# Patient Record
Sex: Female | Born: 1990 | Race: Black or African American | Hispanic: No | Marital: Single | Smoking: Never smoker
Health system: Southern US, Community
[De-identification: ages and names within clinical notes are randomized; demographics above are authoritative.]

## PROBLEM LIST (undated history)

## (undated) ENCOUNTER — Inpatient Hospital Stay (HOSPITAL_COMMUNITY): Payer: Self-pay

## (undated) ENCOUNTER — Ambulatory Visit (HOSPITAL_COMMUNITY): Admission: EM | Disposition: A | Discharge status: Nursing Home (Includes ICF) | Payer: Medicaid Other

## (undated) DIAGNOSIS — T8149XA Infection following a procedure, other surgical site, initial encounter: Secondary | ICD-10-CM

## (undated) DIAGNOSIS — K8020 Calculus of gallbladder without cholecystitis without obstruction: Secondary | ICD-10-CM

## (undated) DIAGNOSIS — J45909 Unspecified asthma, uncomplicated: Secondary | ICD-10-CM

## (undated) DIAGNOSIS — O24419 Gestational diabetes mellitus in pregnancy, unspecified control: Secondary | ICD-10-CM

## (undated) DIAGNOSIS — L509 Urticaria, unspecified: Secondary | ICD-10-CM

## (undated) HISTORY — DX: Unspecified asthma, uncomplicated: J45.909

## (undated) HISTORY — PX: DILATION AND EVACUATION: SHX1459

## (undated) HISTORY — DX: Urticaria, unspecified: L50.9

## (undated) HISTORY — PX: WISDOM TOOTH EXTRACTION: SHX21

---

## 2012-02-05 DIAGNOSIS — O358XX0 Maternal care for other (suspected) fetal abnormality and damage, not applicable or unspecified: Secondary | ICD-10-CM | POA: Insufficient documentation

## 2014-07-28 DIAGNOSIS — T8149XA Infection following a procedure, other surgical site, initial encounter: Secondary | ICD-10-CM

## 2014-07-28 HISTORY — DX: Infection following a procedure, other surgical site, initial encounter: T81.49XA

## 2015-02-28 DIAGNOSIS — T8149XA Infection following a procedure, other surgical site, initial encounter: Secondary | ICD-10-CM | POA: Insufficient documentation

## 2015-11-19 ENCOUNTER — Encounter (HOSPITAL_COMMUNITY): Payer: Self-pay | Admitting: Emergency Medicine

## 2015-11-19 ENCOUNTER — Emergency Department (HOSPITAL_COMMUNITY): Payer: Medicaid Other

## 2015-11-19 ENCOUNTER — Emergency Department (HOSPITAL_COMMUNITY)
Admission: EM | Admit: 2015-11-19 | Discharge: 2015-11-19 | Disposition: A | Discharge status: Home / Self Care | Payer: Medicaid Other | Attending: Emergency Medicine | Admitting: Emergency Medicine

## 2015-11-19 DIAGNOSIS — R1110 Vomiting, unspecified: Secondary | ICD-10-CM | POA: Insufficient documentation

## 2015-11-19 DIAGNOSIS — R079 Chest pain, unspecified: Secondary | ICD-10-CM | POA: Diagnosis present

## 2015-11-19 LAB — I-STAT CHEM 8, ED
BUN: 9 mg/dL (ref 6–20)
CREATININE: 0.7 mg/dL (ref 0.44–1.00)
Calcium, Ion: 1.15 mmol/L (ref 1.12–1.23)
Chloride: 103 mmol/L (ref 101–111)
Glucose, Bld: 95 mg/dL (ref 65–99)
HEMATOCRIT: 37 % (ref 36.0–46.0)
HEMOGLOBIN: 12.6 g/dL (ref 12.0–15.0)
POTASSIUM: 3.6 mmol/L (ref 3.5–5.1)
Sodium: 140 mmol/L (ref 135–145)
TCO2: 24 mmol/L (ref 0–100)

## 2015-11-19 MED ORDER — IBUPROFEN 600 MG PO TABS: 600.0000 mg | ORAL_TABLET | Freq: Four times a day (QID) | ORAL | Status: DC | PRN

## 2015-11-19 MED ORDER — IBUPROFEN 800 MG PO TABS
800.0000 mg | ORAL_TABLET | Freq: Once | ORAL | Status: AC
  Administered 2015-11-19: 800 mg via ORAL
  Filled 2015-11-19: qty 1

## 2015-11-19 NOTE — Discharge Instructions (Signed)
Nonspecific Chest Pain  °Chest pain can be caused by many different conditions. There is always a chance that your pain could be related to something serious, such as a heart attack or a blood clot in your lungs. Chest pain can also be caused by conditions that are not life-threatening. If you have chest pain, it is very important to follow up with your health care provider. °CAUSES  °Chest pain can be caused by: °· Heartburn. °· Pneumonia or bronchitis. °· Anxiety or stress. °· Inflammation around your heart (pericarditis) or lung (pleuritis or pleurisy). °· A blood clot in your lung. °· A collapsed lung (pneumothorax). It can develop suddenly on its own (spontaneous pneumothorax) or from trauma to the chest. °· Shingles infection (varicella-zoster virus). °· Heart attack. °· Damage to the bones, muscles, and cartilage that make up your chest wall. This can include: °¨ Bruised bones due to injury. °¨ Strained muscles or cartilage due to frequent or repeated coughing or overwork. °¨ Fracture to one or more ribs. °¨ Sore cartilage due to inflammation (costochondritis). °RISK FACTORS  °Risk factors for chest pain may include: °· Activities that increase your risk for trauma or injury to your chest. °· Respiratory infections or conditions that cause frequent coughing. °· Medical conditions or overeating that can cause heartburn. °· Heart disease or family history of heart disease. °· Conditions or health behaviors that increase your risk of developing a blood clot. °· Having had chicken pox (varicella zoster). °SIGNS AND SYMPTOMS °Chest pain can feel like: °· Burning or tingling on the surface of your chest or deep in your chest. °· Crushing, pressure, aching, or squeezing pain. °· Dull or sharp pain that is worse when you move, cough, or take a deep breath. °· Pain that is also felt in your back, neck, shoulder, or arm, or pain that spreads to any of these areas. °Your chest pain may come and go, or it may stay  constant. °DIAGNOSIS °Lab tests or other studies may be needed to find the cause of your pain. Your health care provider may have you take a test called an ambulatory ECG (electrocardiogram). An ECG records your heartbeat patterns at the time the test is performed. You may also have other tests, such as: °· Transthoracic echocardiogram (TTE). During echocardiography, sound waves are used to create a picture of all of the heart structures and to look at how blood flows through your heart. °· Transesophageal echocardiogram (TEE). This is a more advanced imaging test that obtains images from inside your body. It allows your health care provider to see your heart in finer detail. °· Cardiac monitoring. This allows your health care provider to monitor your heart rate and rhythm in real time. °· Holter monitor. This is a portable device that records your heartbeat and can help to diagnose abnormal heartbeats. It allows your health care provider to track your heart activity for several days, if needed. °· Stress tests. These can be done through exercise or by taking medicine that makes your heart beat more quickly. °· Blood tests. °· Imaging tests. °TREATMENT  °Your treatment depends on what is causing your chest pain. Treatment may include: °· Medicines. These may include: °¨ Acid blockers for heartburn. °¨ Anti-inflammatory medicine. °¨ Pain medicine for inflammatory conditions. °¨ Antibiotic medicine, if an infection is present. °¨ Medicines to dissolve blood clots. °¨ Medicines to treat coronary artery disease. °· Supportive care for conditions that do not require medicines. This may include: °¨ Resting. °¨ Applying heat   or cold packs to injured areas. °¨ Limiting activities until pain decreases. °HOME CARE INSTRUCTIONS °· If you were prescribed an antibiotic medicine, finish it all even if you start to feel better. °· Avoid any activities that bring on chest pain. °· Do not use any tobacco products, including  cigarettes, chewing tobacco, or electronic cigarettes. If you need help quitting, ask your health care provider. °· Do not drink alcohol. °· Take medicines only as directed by your health care provider. °· Keep all follow-up visits as directed by your health care provider. This is important. This includes any further testing if your chest pain does not go away. °· If heartburn is the cause for your chest pain, you may be told to keep your head raised (elevated) while sleeping. This reduces the chance that acid will go from your stomach into your esophagus. °· Make lifestyle changes as directed by your health care provider. These may include: °¨ Getting regular exercise. Ask your health care provider to suggest some activities that are safe for you. °¨ Eating a heart-healthy diet. A registered dietitian can help you to learn healthy eating options. °¨ Maintaining a healthy weight. °¨ Managing diabetes, if necessary. °¨ Reducing stress. °SEEK MEDICAL CARE IF: °· Your chest pain does not go away after treatment. °· You have a rash with blisters on your chest. °· You have a fever. °SEEK IMMEDIATE MEDICAL CARE IF:  °· Your chest pain is worse. °· You have an increasing cough, or you cough up blood. °· You have severe abdominal pain. °· You have severe weakness. °· You faint. °· You have chills. °· You have sudden, unexplained chest discomfort. °· You have sudden, unexplained discomfort in your arms, back, neck, or jaw. °· You have shortness of breath at any time. °· You suddenly start to sweat, or your skin gets clammy. °· You feel nauseous or you vomit. °· You suddenly feel light-headed or dizzy. °· Your heart begins to beat quickly, or it feels like it is skipping beats. °These symptoms may represent a serious problem that is an emergency. Do not wait to see if the symptoms will go away. Get medical help right away. Call your local emergency services (911 in the [place] Do not drive yourself to the hospital. °  °This  information is not intended to replace advice given to you by your health care provider. Make sure you discuss any questions you have with your health care provider. °  °Document Released: 04/23/2005 Document Revised: 08/04/2014 Document Reviewed: 02/17/2014 °Elsevier Interactive Patient Education ©2016 Elsevier Inc. ° °

## 2015-11-19 NOTE — Care Management Note (Signed)
Case Management Note  Patient Details  Name: Julia Armstrong MRN: [id number] Date of Birth: 07-24-1991  Subjective/Objective:                  Patient presents to the ED with a chief complaint of intermittent chest pain x 2 months./Home with significant other and small children.  Action/Plan: Follow for disposition needs.   Expected Discharge Date:   11/19/15               Expected Discharge Plan:  Home/Self Care  In-House Referral:  NA  Discharge planning Services  CM Consult, Follow-up appt scheduled  Post Acute Care Choice:  NA Choice offered to:  Patient  DME Arranged:  N/A DME Agency:  NA  HH Arranged:  NA HH Agency:  NA  Status of Service:  Completed, signed off  Medicare Important Message Given:    Date Medicare IM Given:    Medicare IM give by:    Date Additional Medicare IM Given:    Additional Medicare Important Message give by:     If discussed at [place] of Stay Meetings, dates discussed:    Additional Comments: Verlon Carcione J. Clydene Laming, RN, BSN, [employer] [phone] ER CM consulted regarding PCP establishment and insurance enrollment. Pt presented to [facility] ER today with chest pain. NCM met with pt at bedside; pt confirms not having access to f/u care with PCP or insurance coverage. Discussed with patient importance and benefits of establishing PCP, and not utilizing the ER for primary care needs. Pt verbalized understanding and is in agreement. Discussed other options, provided list of local  affordable PCPs.  Pt voiced interest in the [facility] and [place].  [facility] Brochure given with address, phone number, and the services highlighted. Informed pt of appointment date (5/2) and time (0930).  No further case management needs communicated at this time.  Fuller Mandril, RN 11/19/2015, 10:00 AM

## 2015-11-19 NOTE — ED Notes (Signed)
Intermittent chest pain x 2 months. Pt states it usually subsides but last night the pain was worse than usual. Pt states she vomited x 1 last night, denies SOB

## 2015-11-19 NOTE — ED Provider Notes (Signed)
CSN: [id number]     Arrival date & time 11/19/15  7829 History   First MD Initiated Contact with Patient 11/19/15 0830     Chief Complaint  Patient presents with  . Chest Pain     (Consider location/radiation/quality/duration/timing/severity/associated sxs/prior Treatment) HPI Comments: Patient presents to the ED with a chief complaint of intermittent chest pain x 2 months.  She states that the pain is sharp and stabbing to her right upper chest.  She denies any SOB.  Denies any associated fever, cough.  Had one episode of vomiting last night.  She has not tried taking anything for her symptoms.  She denies any history of ACS/PE/DVT.  No recent surgeries, immobilization, long travel, or leg swelling.  Does not taken birth control.  She denies any exertional or reproducible symptoms.  The history is provided by the patient. No language interpreter was used.    History reviewed. No pertinent past medical history. Past Surgical History  Procedure Laterality Date  . Cesarean section     No family history on file. Social History  Substance Use Topics  . Smoking status: Never Smoker   . Smokeless tobacco: None  . Alcohol Use: No   OB History    No data available     Review of Systems  Constitutional: Negative for fever and chills.  Respiratory: Negative for shortness of breath.   Cardiovascular: Positive for chest pain.  Gastrointestinal: Negative for nausea, vomiting, diarrhea and constipation.  Genitourinary: Negative for dysuria.  All other systems reviewed and are negative.     Allergies  Review of patient's allergies indicates no known allergies.  Home Medications   Prior to Admission medications   Not on File   BP 121/62 mmHg  Pulse 83  Temp(Src) 98.9 F (37.2 C) (Oral)  Resp 18  Ht  (1.702 m)  Wt 104.327 kg  BMI 36.01 kg/m2  SpO2 100%  LMP 10/04/2015 Physical Exam  Constitutional: She is oriented to person, place, and time. She appears well-developed  and well-nourished.  HENT:  Head: Normocephalic and atraumatic.  Eyes: Conjunctivae and EOM are normal. Pupils are equal, round, and reactive to light.  Neck: Normal range of motion. Neck supple.  Cardiovascular: Normal rate and regular rhythm.  Exam reveals no gallop and no friction rub.   No murmur heard. Pulmonary/Chest: Effort normal and breath sounds normal. No respiratory distress. She has no wheezes. She has no rales. She exhibits no tenderness.  Abdominal: Soft. Bowel sounds are normal. She exhibits no distension and no mass. There is no tenderness. There is no rebound and no guarding.  Musculoskeletal: Normal range of motion. She exhibits no edema or tenderness.  Neurological: She is alert and oriented to person, place, and time.  Skin: Skin is warm and dry.  Psychiatric: She has a normal mood and affect. Her behavior is normal. Judgment and thought content normal.  Nursing note and vitals reviewed.   ED Course  Procedures (including critical care time) Results for orders placed or performed during the hospital encounter of 11/19/15  I-stat chem 8, ed  Result Value Ref Range   Sodium 140 135 - 145 mmol/L   Potassium 3.6 3.5 - 5.1 mmol/L   Chloride 103 101 - 111 mmol/L   BUN 9 6 - 20 mg/dL   Creatinine, Ser 5.62 0.44 - 1.00 mg/dL   Glucose, Bld 95 65 - 99 mg/dL   Calcium, Ion 1.30 8.65 - 1.23 mmol/L   TCO2 24 0 - 100  mmol/L   Hemoglobin 12.6 12.0 - 15.0 g/dL   HCT 16.137.0 09.636.0 - 04.546.0 %   Dg Chest 2 View  11/19/2015  CLINICAL DATA:  Chest pain for 3 months EXAM: CHEST  2 VIEW COMPARISON:  None. FINDINGS: Cardiomediastinal silhouette is unremarkable. No acute infiltrate or pleural effusion. No pulmonary edema. Bony thorax is unremarkable. IMPRESSION: No active cardiopulmonary disease. Electronically Signed   By: Natasha MeadLiviu  Pop M.D.   On: 11/19/2015 09:35    I have personally reviewed and evaluated these images and lab results as part of my medical decision-making.  ED ECG REPORT   I personally interpreted this EKG   Date: 11/19/2015   Rate: 72  Rhythm: normal sinus rhythm  QRS Axis: normal  Intervals: normal  ST/T Wave abnormalities: normal  Conduction Disutrbances:none  Narrative Interpretation:   Old EKG Reviewed: none available    MDM   Final diagnoses:  Chest pain, unspecified chest pain type    Patient with intermittent sharp chest pain x 2 months.  No associated SOB.  Low risk for ACS given age and risk factors.  PERC negative, doubt PE.  Will check CXR, EKG, and electrolytes.  Will treat with ibuprofen.    9:45 AM Electrolytes are normal. EKG is unremarkable for ischemic changes. Chest x-ray is clear.  Plan for discharge to home with close primary care follow-up.  Patient does not have a primary care, discussed with case management, who will see the patient and help get her set up with primary care.      Roxy Horsemanobert Aziza Stuckert, PA-C 11/19/15 [id number]  Raeford RazorStephen Kohut, MD 11/25/15 2127

## 2015-11-27 ENCOUNTER — Inpatient Hospital Stay: Payer: Self-pay | Admitting: Internal Medicine

## 2016-02-15 ENCOUNTER — Emergency Department (HOSPITAL_COMMUNITY)
Admission: EM | Admit: 2016-02-15 | Discharge: 2016-02-15 | Disposition: A | Discharge status: Home / Self Care | Payer: Medicaid Other | Attending: Emergency Medicine | Admitting: Emergency Medicine

## 2016-02-15 ENCOUNTER — Encounter (HOSPITAL_COMMUNITY): Payer: Self-pay | Admitting: *Deleted

## 2016-02-15 DIAGNOSIS — R1110 Vomiting, unspecified: Secondary | ICD-10-CM | POA: Insufficient documentation

## 2016-02-15 DIAGNOSIS — R51 Headache: Secondary | ICD-10-CM | POA: Diagnosis not present

## 2016-02-15 DIAGNOSIS — K0889 Other specified disorders of teeth and supporting structures: Secondary | ICD-10-CM

## 2016-02-15 MED ORDER — ONDANSETRON 4 MG PO TBDP
4.0000 mg | ORAL_TABLET | Freq: Once | ORAL | Status: AC
  Administered 2016-02-15: 4 mg via ORAL
  Filled 2016-02-15: qty 1

## 2016-02-15 MED ORDER — OXYCODONE-ACETAMINOPHEN 5-325 MG PO TABS: 1.0000 | ORAL_TABLET | Freq: Four times a day (QID) | ORAL | Status: DC | PRN

## 2016-02-15 MED ORDER — ONDANSETRON HCL 4 MG PO TABS: 4.0000 mg | ORAL_TABLET | Freq: Three times a day (TID) | ORAL | Status: DC | PRN

## 2016-02-15 MED ORDER — NAPROXEN 500 MG PO TABS: 500.0000 mg | ORAL_TABLET | Freq: Two times a day (BID) | ORAL | Status: DC

## 2016-02-15 MED ORDER — OXYCODONE-ACETAMINOPHEN 5-325 MG PO TABS
1.0000 | ORAL_TABLET | Freq: Once | ORAL | Status: AC
  Administered 2016-02-15: 1 via ORAL
  Filled 2016-02-15: qty 1

## 2016-02-15 NOTE — ED Notes (Signed)
Left lower molar broken with ? Decay for several days has had pain

## 2016-02-15 NOTE — ED Notes (Signed)
Bed: WA26 Expected date:  Expected time:  Means of arrival:  Comments: 

## 2016-02-15 NOTE — Discharge Instructions (Signed)
Dental Pain °Dental pain may be caused by many things, including: °· Tooth decay (cavities or caries). Cavities expose the nerve of your tooth to air and hot or cold temperatures. This can cause pain or discomfort. °· Abscess or infection. A dental abscess is a collection of infected pus from a bacterial infection in the inner part of the tooth (pulp). It usually occurs at the end of the tooth's root. °· Injury. °· An unknown reason (idiopathic). °Your pain may be mild or severe. It may only occur when: °· You are chewing. °· You are exposed to hot or cold temperature. °· You are eating or drinking sugary foods or beverages, such as soda or candy. °Your pain may also be constant. °HOME CARE INSTRUCTIONS °Watch your dental pain for any changes. The following actions may help to lessen any discomfort that you are feeling: °· Take medicines only as directed by your dentist. °· If you were prescribed an antibiotic medicine, finish all of it even if you start to feel better. °· Keep all follow-up visits as directed by your dentist. This is important. °· Do not apply heat to the outside of your face. °· Rinse your mouth or gargle with salt water if directed by your dentist. This helps with pain and swelling. °¨ You can make salt water by adding ¼ tsp of salt to 1 cup of warm water. °· Apply ice to the painful area of your face: °¨ Put ice in a plastic bag. °¨ Place a towel between your skin and the bag. °¨ Leave the ice on for 20 minutes, 2-3 times per day. °· Avoid foods or drinks that cause you pain, such as: °¨ Very hot or very cold foods or drinks. °¨ Sweet or sugary foods or drinks. °SEEK MEDICAL CARE IF: °· Your pain is not controlled with medicines. °· Your symptoms are worse. °· You have new symptoms. °SEEK IMMEDIATE MEDICAL CARE IF: °· You are unable to open your mouth. °· You are having trouble breathing or swallowing. °· You have a fever. °· Your face, neck, or jaw is swollen. °  °This information is not  intended to replace advice given to you by your health care provider. Make sure you discuss any questions you have with your health care provider. °  °Document Released: 07/14/2005 Document Revised: 11/28/2014 Document Reviewed: 07/10/2014 °Elsevier Interactive Patient Education ©2016 Elsevier Inc. ° ° °[employer]  °[employer]  °[facility]  °[address]  °[place], [place] [address]  °Phone [phone]  °The [employer] [facility] in [place], [place], exemplifies the Dental School’s vision to improve the health and quality of life of all [place]ians by creating leaders with a passion to care for the underserved and by leading the nation in community-based, service learning oral health education. °We are committed to offering comprehensive general dental services for adults, children and special needs patients in a safe, caring and professional setting. ° °Appointments: Our clinic is open Monday through Friday 8:00 a.m. until 5:00 p.m. The amount of time scheduled for an appointment depends on the patient’s specific needs. We ask that you keep your appointed time for care or provide 24-hour notice of all appointment changes. Parents or legal guardians must accompany minor children. ° °Payment for Services: Medicaid and other insurance plans are welcome. Payment for services is due when services are rendered and may be made by cash or credit   card. If you have dental insurance, we will assist you with your claim submission.  ° °Emergencies: Emergency services will be provided Monday through Friday on a walk-in basis. Please arrive early for emergency services. After hours emergency services will be provided for patients of record as required. ° °Services:  °Comprehensive General Dentistry  °Children’s Dentistry  °Oral Surgery - Extractions  °Root Canals  °Sealants and  Tooth Colored Fillings  °Crowns and Bridges  °Dentures and Partial Dentures  °Implant Services  °Periodontal Services and Cleanings  °Cosmetic Tooth Whitening  °Digital Radiography  °3-D/[employer] Beam Imaging ° ° °Community Resource Guide Dental °The [employer]’s “211” is a great source of information about community services available.  Access by dialing 2-1-1 from anywhere in [place], or by website -  www.nc211.org.  ° °Other Local Resources (Updated 07/2015) ° °Dental  Care °  °Services ° °  °Phone Number and Address  °Cost  °[facility] For children 0 - 21 years of age:  °• Cleaning °• Tooth brushing/flossing instruction °• Sealants, fillings, crowns °• Extractions °• Emergency treatment  [phone] °[address] °[place], [place] [address] Charges based on family income.  Medicaid and some insurance plans accepted.   °  °[facility] • Cleaning °• Sealants, fillings, crowns °• Extractions °• Emergency treatment [phone] °[address] °[place], [place] ° Pregnant women 18 years of age or older with a Medicaid card  °[facility] [place] • Cleaning °• Sealants, fillings, crowns °• Extractions °• Emergency treatment [phone] °[address] °[place], [place] Pregnant women 18 years of age or older with a Medicaid card  °[facility] For children 0 - 21 years of age:  °• Cleaning °• Tooth brushing/flossing instruction °• Sealants, fillings, crowns °• Extractions °• Emergency treatment °Limited orthodontic services for patients with Medicaid [phone] °[address] °[place], [place] [address] Medicaid and [place] Health Choice cover for children up to age 21 and pregnant women.  Parents of children up to age 21 without Medicaid pay a reduced fee at time of service.  °[facility] [place] For  children 0 - 21 years of age:  °• Cleaning °• Tooth brushing/flossing instruction °• Sealants, fillings, crowns °• Extractions °• Emergency treatment °Limited orthodontic services for patients with Medicaid [phone] °[address] °[place], [place].  Medicaid and [place] Health Choice cover for children up to age 21 and pregnant women.  Parents of children up to age 21 without Medicaid pay a reduced fee.  °[facility] • Cleaning °• Sealants, fillings, crowns °• Extractions ° °Hours: Tuesdays and Thursdays, 4:15 - 8 pm [phone] °[address] °[place], [place] [address] Services free of charge to [place] residents ages 18-64 who do not have health insurance, Medicare, Medicaid, or VA benefits and fall within federal poverty guidelines  °[facility] ° ° ° Provides dental care in addition to primary medical care, nutritional counseling, and pharmacy: °• Cleaning °• Sealants, fillings, crowns °• Extractions ° ° ° ° ° ° ° ° ° ° ° ° ° ° ° ° ° [phone] °[facility], [address] °[place], [place] ° °[phone] °[facility], [address] [place], [place] ° °[phone] °[facility] °[place],  ° °[phone] °[facility], [address] °[place], [place] ° °[phone] °[facility] °[address] °[place], [place] Accepts Medicaid, Medicare, most insurance.  Also provides services available to all with fees adjusted based on ability to pay.    °[facility] • Cleaning °• Tooth brushing/flossing instruction °• Sealants, fillings, crowns °• Extractions °• Emergency treatment °Hours: Tuesdays, Thursdays, and Fridays from 8 am to 5 pm by appointment only. [phone] °[address] °[place], [place] [address] [place] residents with Medicaid (depending on eligibility) and children with [place]  Health Choice - call for more information.  °[facility] • Extractions only ° °Hours: 2nd and 4th Thursday of each month from 6:30 am - 9 am.   [phone]. [phone] °[address] °[place], [place] [address] Ages 18 and older only.  Patients are seen on a first come, first served basis.  °[facility] • Cleanings °• Fillings °• Extractions °• Orthodontics °• Endodontics °• Implants/Crowns/Bridges °• Complete and partial dentures [phone] °[place], [place] Patients must complete an application for services.  There is often a waiting list.   ° °

## 2016-02-15 NOTE — ED Provider Notes (Signed)
CSN: [id number]     Arrival date & time 02/15/16  1110 History  By signing my name below, I, [facility], attest that this documentation has been prepared under the direction and in the presence of Arthor Captain, PA-C. Electronically Signed: Randell Patient, ED Scribe. 02/15/2016. 12:18 PM.    Chief Complaint  Patient presents with  . Dental Pain    The history is provided by the patient. No language interpreter was used.   HPI Comments: Julia Armstrong is a 25 y.o. female with no pertinent chronic conditions who presents to the Emergency Department complaining of constant, moderate upper left dental pain onset 3 days ago. Pt states that she had the crown of a tooth on the upper left three days ago followed by pain. She reports associated emesis and HA. Pain is worse with eating and cold and hot air exposure. Denies having a dentist currently. Denies fever.  History reviewed. No pertinent past medical history. Past Surgical History  Procedure Laterality Date  . Cesarean section     No family history on file. Social History  Substance Use Topics  . Smoking status: Never Smoker   . Smokeless tobacco: None  . Alcohol Use: No   OB History    No data available     Review of Systems  Constitutional: Negative for fever.  HENT: Positive for dental problem.   Gastrointestinal: Positive for vomiting.  Neurological: Positive for headaches.      Allergies  Review of patient's allergies indicates no known allergies.  Home Medications   Prior to Admission medications   Medication Sig Start Date End Date Taking? Authorizing Provider  ibuprofen (ADVIL,MOTRIN) 600 MG tablet Take 1 tablet (600 mg total) by mouth every 6 (six) hours as needed. 11/19/15   Roxy Horseman, PA-C  naproxen (NAPROSYN) 500 MG tablet Take 1 tablet (500 mg total) by mouth 2 (two) times daily with a meal. 02/15/16   Arthor Captain, PA-C  ondansetron (ZOFRAN) 4 MG tablet Take 1 tablet (4 mg total) by  mouth every 8 (eight) hours as needed for nausea or vomiting. 02/15/16   Arthor Captain, PA-C  oxyCODONE-acetaminophen (PERCOCET) 5-325 MG tablet Take 1 tablet by mouth every 6 (six) hours as needed. 02/15/16   Odessa Morren, PA-C   BP 145/77 mmHg  Pulse 91  Temp(Src) 99.2 F (37.3 C) (Oral)  Resp 15  Ht  (1.702 m)  Wt 224 lb (101.606 kg)  BMI 35.08 kg/m2  SpO2 99%  LMP 02/10/2016 Physical Exam  Constitutional: She is oriented to person, place, and time. She appears well-developed and well-nourished. No distress.  HENT:  Head: Normocephalic and atraumatic.  Mouth/Throat: Oropharynx is clear and moist.  2nd left upper molar crown missing. No fluctuance but exposed pulp and dentin. Oropharynx clear and moist. No facial swelling.  Eyes: Conjunctivae are normal.  Neck: Normal range of motion.  Cardiovascular: Normal rate.   Pulmonary/Chest: Effort normal. No respiratory distress.  Musculoskeletal: Normal range of motion.  Neurological: She is alert and oriented to person, place, and time.  Skin: Skin is warm and dry.  Psychiatric: She has a normal mood and affect. Her behavior is normal.  Nursing note and vitals reviewed.   ED Course  Procedures   DIAGNOSTIC STUDIES: Oxygen Saturation is 99% on RA, normal by my interpretation.    COORDINATION OF CARE: 12:07 PM Will order Percocet and Zofran. Will provide pt with a referral to a dentist. Advised pt to follow-up with dentist. Discussed treatment plan  with pt at bedside and pt agreed to plan.    MDM   Final diagnoses:  Pain, dental    Patient with dentalgia. Looked up patient in the [facility] and pt has no prescribed controlled substances reported in the past 30 days. No abscess requiring immediate incision and drainage.  Exam not concerning for Ludwig's angina or pharyngeal abscess.  Will treat with Percocet, Naprosyn, and Zofran. Pt instructed to follow-up with dentist. Discussed return precautions. Pt safe for  discharge. I personally performed the services described in this documentation, which was scribed in my presence. The recorded information has been reviewed and is accurate.     Arthor Captainbigail Kellie Chisolm, PA-C 02/15/16 1843  Marily MemosJason Mesner, MD 02/16/16 1455

## 2016-02-15 NOTE — ED Notes (Signed)
Patient c/o top left dental pain x3 days with some N/V.  Patient denies fever.  Patient has intermittently taken 800 mg ibuprofen with no relief.  She called a dentist, but does not have her Medicaid card and the dentist would not see her.  Patient also c/o headache on left side r/t dental pain.  Pt directed to ED.  Top left second molar appears to be broken.

## 2016-03-03 ENCOUNTER — Encounter (HOSPITAL_COMMUNITY): Payer: Self-pay | Admitting: Emergency Medicine

## 2016-03-03 ENCOUNTER — Emergency Department (HOSPITAL_COMMUNITY)
Admission: EM | Admit: 2016-03-03 | Discharge: 2016-03-03 | Disposition: A | Discharge status: Home / Self Care | Payer: Medicaid Other | Attending: Emergency Medicine | Admitting: Emergency Medicine

## 2016-03-03 DIAGNOSIS — K0889 Other specified disorders of teeth and supporting structures: Secondary | ICD-10-CM | POA: Diagnosis not present

## 2016-03-03 MED ORDER — PENICILLIN V POTASSIUM 500 MG PO TABS: 500.0000 mg | ORAL_TABLET | Freq: Three times a day (TID) | ORAL | 0 refills | Status: DC

## 2016-03-03 MED ORDER — NAPROXEN 500 MG PO TABS: 500.0000 mg | ORAL_TABLET | Freq: Two times a day (BID) | ORAL | 0 refills | Status: DC

## 2016-03-03 NOTE — ED Notes (Signed)
Declined W/C at D/C and was escorted to lobby by RN. 

## 2016-03-03 NOTE — ED Provider Notes (Signed)
[facility]-EMERGENCY DEPT Provider Note   CSN: [id number] Arrival date & time: 03/03/16  1241  First Provider Contact:   First MD Initiated Contact with Patient 03/03/16 1546     By signing my name below, I, Julia Armstrong, attest that this documentation has been prepared under the direction and in the presence of non-physician practitioner, Fayrene HelperBowie Cherine Drumgoole, PA-C . Electronically Signed: Freida Busmaniana Armstrong, Scribe. 03/03/2016. 3:49 PM.   History   Chief Complaint Chief Complaint  Patient presents with  . Dental Problem   The history is provided by the patient. No language interpreter was used.    HPI Comments:  Julia Armstrong is a 25 y.o. female who presents to the Emergency Department complaining of right lower and left upper dental pain x several weeks. Pt notes she broke the left upper a few weeks ago and the crown came off. Her pain is exacerbated by temperature change. She reports associated vomiting.  She denies fever, chills, change in appetite, HA and  lightheadedness. Pt was seen in the ED on 02/15/16 for the same pain and was given Naproxen and Percocet which she has been taking with some relief. She has a dental follow up in 2 weeks.    History reviewed. No pertinent past medical history.  There are no active problems to display for this patient.   Past Surgical History:  Procedure Laterality Date  . CESAREAN SECTION      OB History    No data available       Home Medications    Prior to Admission medications   Medication Sig Start Date End Date Taking? Authorizing Provider  ibuprofen (ADVIL,MOTRIN) 600 MG tablet Take 1 tablet (600 mg total) by mouth every 6 (six) hours as needed. 11/19/15   Roxy Horsemanobert Browning, PA-C  naproxen (NAPROSYN) 500 MG tablet Take 1 tablet (500 mg total) by mouth 2 (two) times daily with a meal. 02/15/16   Arthor CaptainAbigail Harris, PA-C  ondansetron (ZOFRAN) 4 MG tablet Take 1 tablet (4 mg total) by mouth every 8 (eight) hours as needed for nausea or vomiting.  02/15/16   Arthor CaptainAbigail Harris, PA-C  oxyCODONE-acetaminophen (PERCOCET) 5-325 MG tablet Take 1 tablet by mouth every 6 (six) hours as needed. 02/15/16   Arthor CaptainAbigail Harris, PA-C    Family History No family history on file.  Social History Social History  Substance Use Topics  . Smoking status: Never Smoker  . Smokeless tobacco: Never Used  . Alcohol use No     Allergies   Review of patient's allergies indicates no known allergies.   Review of Systems Review of Systems  Constitutional: Negative for appetite change, chills and fever.  HENT: Positive for dental problem.   Respiratory: Negative for shortness of breath.   Cardiovascular: Negative for chest pain.  Gastrointestinal: Positive for vomiting.  Neurological: Negative for light-headedness and headaches.    Physical Exam Updated Vital Signs BP 140/91   Pulse 82   Temp 98.3 F (36.8 C) (Oral)   Ht 5\' 8"  (1.727 m)   Wt 215 lb 5 oz (97.7 kg)   LMP 02/03/2016   SpO2 100%   BMI 32.74 kg/m   Physical Exam  Constitutional: She is oriented to person, place, and time. She appears well-developed and well-nourished. No distress.  HENT:  Head: Normocephalic and atraumatic.  Tooth #14 with TTP as dental decay as well as Tooth #29    Eyes: Conjunctivae are normal.  Cardiovascular: Normal rate.   Pulmonary/Chest: Effort normal.  Neurological: She is alert  and oriented to person, place, and time.  Skin: Skin is warm and dry.  Psychiatric: She has a normal mood and affect.  Nursing note and vitals reviewed.   ED Treatments / Results  DIAGNOSTIC STUDIES:  Oxygen Saturation is 100% on RA, normal by my interpretation.    COORDINATION OF CARE:  3:49 PM Discussed treatment plan with pt at bedside and pt agreed to plan.  Labs (all labs ordered are listed, but only abnormal results are displayed) Labs Reviewed - No data to display  EKG  EKG Interpretation None       Radiology No results found.  Procedures Procedures     Medications Ordered in ED Medications - No data to display   Initial Impression / Assessment and Plan / ED Course  I have reviewed the triage vital signs and the nursing notes.  Pertinent labs & imaging results that were available during my care of the patient were reviewed by me and considered in my medical decision making (see chart for details).  Clinical Course    BP 140/91   Pulse 82   Temp 98.3 F (36.8 C) (Oral)   Ht  (1.727 m)   Wt 97.7 kg   LMP 02/03/2016   SpO2 100%   BMI 32.74 kg/m    Final Clinical Impressions(s) / ED Diagnoses   Patient with dentalgia.  No abscess requiring immediate incision and drainage.  Exam not concerning for Ludwig's angina or pharyngeal abscess.  Will treat with abx and nsaids. Pt instructed to follow-up with dentist.  Discussed return precautions. Pt safe for discharge.  Final diagnoses:  Pain, dental    New Prescriptions Discharge Medication List as of 03/03/2016  3:53 PM    START taking these medications   Details  penicillin v potassium (VEETID) 500 MG tablet Take 1 tablet (500 mg total) by mouth 3 (three) times daily., Starting Mon 03/03/2016, Print        I personally performed the services described in this documentation, which was scribed in my presence. The recorded information has been reviewed and is accurate.       Fayrene Helper, PA-C 03/04/16 1610    Rolan Bucco, MD 03/04/16 [phone]

## 2016-03-03 NOTE — ED Triage Notes (Signed)
Pt. Stated, I've got 2 teeth that has came out and don't have an appt. Til 2 weeks.

## 2016-03-20 ENCOUNTER — Emergency Department (HOSPITAL_COMMUNITY)
Admission: EM | Admit: 2016-03-20 | Discharge: 2016-03-20 | Disposition: A | Discharge status: Home / Self Care | Payer: Medicaid Other | Attending: Emergency Medicine | Admitting: Emergency Medicine

## 2016-03-20 ENCOUNTER — Encounter (HOSPITAL_COMMUNITY): Payer: Self-pay | Admitting: Emergency Medicine

## 2016-03-20 ENCOUNTER — Emergency Department (HOSPITAL_COMMUNITY): Payer: Medicaid Other

## 2016-03-20 DIAGNOSIS — M25571 Pain in right ankle and joints of right foot: Secondary | ICD-10-CM | POA: Insufficient documentation

## 2016-03-20 DIAGNOSIS — Z79899 Other long term (current) drug therapy: Secondary | ICD-10-CM | POA: Insufficient documentation

## 2016-03-20 DIAGNOSIS — M79671 Pain in right foot: Secondary | ICD-10-CM

## 2016-03-20 MED ORDER — IBUPROFEN 800 MG PO TABS: 800.0000 mg | ORAL_TABLET | Freq: Three times a day (TID) | ORAL | 0 refills | Status: DC | PRN

## 2016-03-20 NOTE — ED Notes (Signed)
Pt ambulatory and independent at discharge.  Verbalized understanding of discharge instructions 

## 2016-03-20 NOTE — ED Triage Notes (Signed)
Pt states she dropped a pallet on her right foot at work 2 days ago  Pt states it is painful to walk on and stand on  Pt has slight bruising noted to top of foot

## 2016-03-20 NOTE — Discharge Instructions (Signed)
Take ibuprofen as needed for pain.  Ice and elevate for additional pain relief.  Return to ER for new or worsening symptoms, any additional concerns.   COLD THERAPY DIRECTIONS:  Ice or gel packs can be used to reduce both pain and swelling. Ice is the most helpful within the first 24 to 48 hours after an injury or flareup from overusing a muscle or joint.  Ice is effective, has very few side effects, and is safe for most people to use.   If you expose your skin to cold temperatures for too long or without the proper protection, you can damage your skin or nerves. Watch for signs of skin damage due to cold.   HOME CARE INSTRUCTIONS  Follow these tips to use ice and cold packs safely.  Place a dry or damp towel between the ice and skin. A damp towel will cool the skin more quickly, so you may need to shorten the time that the ice is used.  For a more rapid response, add gentle compression to the ice.  Ice for no more than 10 to 20 minutes at a time. The bonier the area you are icing, the less time it will take to get the benefits of ice.  Check your skin after 5 minutes to make sure there are no signs of a poor response to cold or skin damage.  Rest 20 minutes or more in between uses.  Once your skin is numb, you can end your treatment. You can test numbness by very lightly touching your skin. The touch should be so light that you do not see the skin dimple from the pressure of your fingertip. When using ice, most people will feel these normal sensations in this order: cold, burning, aching, and numbness.

## 2016-03-20 NOTE — ED Notes (Signed)
Patient transported to X-ray 

## 2016-03-21 NOTE — ED Provider Notes (Signed)
[facility]-EMERGENCY DEPT Provider Note   CSN: [id number] Arrival date & time: 03/20/16  2103     History   Chief Complaint Chief Complaint  Patient presents with  . Foot Pain    HPI Julia Armstrong is a 25 y.o. female.  The history is provided by the patient and medical records. No language interpreter was used.  Foot Pain   Julia Armstrong is an otherwise healthy 25 y.o. female  who presents to the Emergency Department complaining of Constant, worsening right foot pain x 2 days after dropping a pallet on her foot while at work. Patient states she has been taking 800 mg ibuprofen with no relief of her pain. Endorses associated right foot swelling. Denies wound, numbness/tingling, weakness. Able to ambulate, but states it is painful. No history of right lower extremity injuries.   History reviewed. No pertinent past medical history.  There are no active problems to display for this patient.   Past Surgical History:  Procedure Laterality Date  . CESAREAN SECTION      OB History    No data available       Home Medications    Prior to Admission medications   Medication Sig Start Date End Date Taking? Authorizing Provider  ibuprofen (ADVIL,MOTRIN) 800 MG tablet Take 1 tablet (800 mg total) by mouth every 8 (eight) hours as needed. 03/20/16   Chase PicketJaime Pilcher Jaycee Pelzer, PA-C  naproxen (NAPROSYN) 500 MG tablet Take 1 tablet (500 mg total) by mouth 2 (two) times daily with a meal. 03/03/16   Fayrene HelperBowie Tran, PA-C  ondansetron (ZOFRAN) 4 MG tablet Take 1 tablet (4 mg total) by mouth every 8 (eight) hours as needed for nausea or vomiting. 02/15/16   Arthor CaptainAbigail Harris, PA-C  oxyCODONE-acetaminophen (PERCOCET) 5-325 MG tablet Take 1 tablet by mouth every 6 (six) hours as needed. 02/15/16   Arthor CaptainAbigail Harris, PA-C  penicillin v potassium (VEETID) 500 MG tablet Take 1 tablet (500 mg total) by mouth 3 (three) times daily. 03/03/16   Fayrene HelperBowie Tran, PA-C    Family History Family History  Problem Relation Age of  Onset  . Cancer Other     Social History Social History  Substance Use Topics  . Smoking status: Never Smoker  . Smokeless tobacco: Never Used  . Alcohol use No     Allergies   Review of patient's allergies indicates no known allergies.   Review of Systems Review of Systems  Musculoskeletal: Positive for arthralgias.  Skin: Negative for color change and wound.  Neurological: Negative for weakness and numbness.     Physical Exam Updated Vital Signs BP 125/69 (BP Location: Right Arm)   Pulse 68   Temp 97.7 F (36.5 C) (Oral)   Resp 16   Ht 5\' 7"  (1.702 m)   Wt 93.9 kg   LMP 03/09/2016 (Approximate)   SpO2 98%   BMI 32.42 kg/m   Physical Exam  Constitutional: She is oriented to person, place, and time. She appears well-developed and well-nourished. No distress.  HENT:  Head: Normocephalic and atraumatic.  Cardiovascular: Normal rate, regular rhythm and normal heart sounds.   Pulmonary/Chest: Effort normal and breath sounds normal. No respiratory distress.  Musculoskeletal:       Feet:  Right foot/ankle: No gross deformity noted. Patient has full ROM without pain. There is no joint effusion noted. No erythema or warmth overlaying the joint. There is tenderness to palpation as depicted in image. No pain to fifth metatarsal area or malleoli . 2+ DP pulses, sensation  intact to medial, lateral, dorsal and plantar aspects.  Neurological: She is alert and oriented to person, place, and time.  Skin: Skin is warm and dry.  Nursing note and vitals reviewed.    ED Treatments / Results  Labs (all labs ordered are listed, but only abnormal results are displayed) Labs Reviewed - No data to display  EKG  EKG Interpretation None       Radiology Dg Foot Complete Right  Result Date: 03/20/2016 CLINICAL DATA:  Right foot pain after injury. Dropped a pallet on foot at work. Swelling. EXAM: RIGHT FOOT COMPLETE - 3+ VIEW COMPARISON:  None. FINDINGS: There is no evidence  of fracture or dislocation. There is no evidence of arthropathy or other focal bone abnormality. Mild dorsal soft tissue edema. IMPRESSION: Soft tissue edema without evidence of acute fracture or subluxation. Electronically Signed   By: Rubye Oaks M.D.   On: 03/20/2016 22:41    Procedures Procedures (including critical care time)  Medications Ordered in ED Medications - No data to display   Initial Impression / Assessment and Plan / ED Course  I have reviewed the triage vital signs and the nursing notes.  Pertinent labs & imaging results that were available during my care of the patient were reviewed by me and considered in my medical decision making (see chart for details).  Clinical Course   Julia Armstrong is a 25 y.o. female who presents with right foot pain after dropping a pallet on foot 2 days ago. On exam, RLE is NVI. There is tenderness to palpation of medial aspect of the dorsum of foot. No overlying skin changes. X-rays were obtained which were unremarkable. Post-op shoe provided for comfort. Ibuprofen for pain. RICE therapy discussed. All questions answered.   Final Clinical Impressions(s) / ED Diagnoses   Final diagnoses:  Right foot pain    New Prescriptions Discharge Medication List as of 03/20/2016 11:26 PM       Chase Picket Zettie Gootee, PA-C 03/21/16 0200    Tilden Fossa, MD 03/22/16 [phone]

## 2016-04-02 ENCOUNTER — Other Ambulatory Visit: Payer: Self-pay | Admitting: Occupational Medicine

## 2016-04-02 ENCOUNTER — Ambulatory Visit: Payer: Self-pay

## 2016-04-02 DIAGNOSIS — M25572 Pain in left ankle and joints of left foot: Secondary | ICD-10-CM

## 2016-07-04 ENCOUNTER — Encounter (HOSPITAL_COMMUNITY): Payer: Self-pay | Admitting: Emergency Medicine

## 2016-07-04 ENCOUNTER — Emergency Department (HOSPITAL_COMMUNITY)
Admission: EM | Admit: 2016-07-04 | Discharge: 2016-07-05 | Disposition: A | Discharge status: Home / Self Care | Payer: Medicaid Other | Attending: Emergency Medicine | Admitting: Emergency Medicine

## 2016-07-04 DIAGNOSIS — K0889 Other specified disorders of teeth and supporting structures: Secondary | ICD-10-CM | POA: Insufficient documentation

## 2016-07-04 NOTE — ED Provider Notes (Signed)
[facility]-EMERGENCY DEPT Provider Note   CSN: [id number] Arrival date & time: 07/04/16  2241 By signing my name below, I, Julia Armstrong, attest that this documentation has been prepared under the direction and in the presence of Julia Creasehristopher J Mahina Salatino, MD. Electronically Signed: Linus GalasMaharshi Armstrong, ED Scribe. 07/05/16. 11:35 PM.  History   Chief Complaint Chief Complaint  Patient presents with  . Dental Pain   The history is provided by the patient. No language interpreter was used.   HPI Comments: Julia Armstrong is a 25 y.o. female who presents to the Emergency Department complaining of left upper dental pain that has been ongoing for 1 week. Pt states she had an extraction last week and prescribed 800 mg Tylenol. However, she has not had any relief. Pt denies any fevers, chill, CP, SOB, N/V/D or any other symptoms at this time. Pt is not on any antibiotics.   History reviewed. No pertinent past medical history.  There are no active problems to display for this patient.   Past Surgical History:  Procedure Laterality Date  . CESAREAN SECTION      OB History    No data available       Home Medications    Prior to Admission medications   Medication Sig Start Date End Date Taking? Authorizing Provider  clindamycin (CLEOCIN) 150 MG capsule Take 2 capsules (300 mg total) by mouth 4 (four) times daily. 07/05/16   Julia Creasehristopher J Joelene Barriere, MD  ibuprofen (ADVIL,MOTRIN) 800 MG tablet Take 1 tablet (800 mg total) by mouth every 8 (eight) hours as needed. 03/20/16   Julia PicketJaime Pilcher Ward, PA-C  naproxen (NAPROSYN) 500 MG tablet Take 1 tablet (500 mg total) by mouth 2 (two) times daily with a meal. 03/03/16   Julia HelperBowie Tran, PA-C  ondansetron (ZOFRAN) 4 MG tablet Take 1 tablet (4 mg total) by mouth every 8 (eight) hours as needed for nausea or vomiting. 02/15/16   Julia CaptainAbigail Harris, PA-C  oxyCODONE-acetaminophen (PERCOCET) 5-325 MG tablet Take 1 tablet by mouth every 6 (six) hours as needed. 02/15/16   Julia CaptainAbigail  Harris, PA-C  penicillin v potassium (VEETID) 500 MG tablet Take 1 tablet (500 mg total) by mouth 3 (three) times daily. 03/03/16   Julia HelperBowie Tran, PA-C    Family History Family History  Problem Relation Age of Onset  . Cancer Other     Social History Social History  Substance Use Topics  . Smoking status: Never Smoker  . Smokeless tobacco: Never Used  . Alcohol use No     Allergies   Patient has no known allergies.   Review of Systems Review of Systems  Constitutional: Negative for chills and fever.  HENT: Positive for dental problem.   Respiratory: Negative for shortness of breath.   Cardiovascular: Negative for chest pain.  Gastrointestinal: Negative for diarrhea, nausea and vomiting.   Physical Exam Updated Vital Signs BP 144/82 (BP Location: Right Arm)   Pulse 86   Resp 18   Ht 5\' 7"  (1.702 m)   Wt 205 lb (93 kg)   LMP 06/20/2016 (Approximate)   SpO2 100%   BMI 32.11 kg/m   Physical Exam  Constitutional: She is oriented to person, place, and time. She appears well-developed and well-nourished. No distress.  HENT:  Head: Normocephalic and atraumatic.  Right Ear: Hearing normal.  Left Ear: Hearing normal.  Nose: Nose normal.  Mouth/Throat: Oropharynx is clear and moist and mucous membranes are normal.  Post-surgical extraction of left upper molar without significant swelling, abscess or  drainage.   Eyes: Conjunctivae and EOM are normal. Pupils are equal, round, and reactive to light.  Neck: Normal range of motion. Neck supple.  Cardiovascular: Regular rhythm, S1 normal and S2 normal.  Exam reveals no gallop and no friction rub.   No murmur heard. Pulmonary/Chest: Effort normal and breath sounds normal. No respiratory distress. She exhibits no tenderness.  Abdominal: Soft. Normal appearance and bowel sounds are normal. There is no hepatosplenomegaly. There is no tenderness. There is no rebound, no guarding, no tenderness at McBurney's point and negative Murphy's  sign. No hernia.  Musculoskeletal: Normal range of motion.  Neurological: She is alert and oriented to person, place, and time. She has normal strength. No cranial nerve deficit or sensory deficit. Coordination normal. GCS eye subscore is 4. GCS verbal subscore is 5. GCS motor subscore is 6.  Skin: Skin is warm, dry and intact. No rash noted. No cyanosis.  Psychiatric: She has a normal mood and affect. Her speech is normal and behavior is normal. Thought content normal.  Nursing note and vitals reviewed.  ED Treatments / Results  DIAGNOSTIC STUDIES: Oxygen Saturation is 100% on room air, normal by my interpretation.    COORDINATION OF CARE: 11:38 PM Discussed treatment plan with pt at bedside and pt agreed to plan.  Labs (all labs ordered are listed, but only abnormal results are displayed) Labs Reviewed - No data to display  EKG  EKG Interpretation None       Radiology No results found.  Procedures Procedures (including critical care time)  Medications Ordered in ED Medications  clindamycin (CLEOCIN) capsule 300 mg (not administered)     Initial Impression / Assessment and Plan / ED Course  I have reviewed the triage vital signs and the nursing notes.  Pertinent labs & imaging results that were available during my care of the patient were reviewed by me and considered in my medical decision making (see chart for details).  Clinical Course    Patient presents to the emergency department for evaluation of dental pain. Patient had dental extraction by oral surgery one week ago. She reports that she has had pain since. Pain is not controlled by ibuprofen and Norco. Examination does not show any significant signs of postop complication or infection. Patient will require empiric coverage with antibiotics and repeat follow-up with her oral surgeon.  Final Clinical Impressions(s) / ED Diagnoses   Final diagnoses:  Pain, dental    New Prescriptions New Prescriptions    CLINDAMYCIN (CLEOCIN) 150 MG CAPSULE    Take 2 capsules (300 mg total) by mouth 4 (four) times daily.   I personally performed the services described in this documentation, which was scribed in my presence. The recorded information has been reviewed and is accurate.     Julia Creasehristopher J Curtis Uriarte, MD 07/05/16 [phone]

## 2016-07-04 NOTE — ED Notes (Signed)
Pt had 3 teeth pulled, 1 on the right and 2 on the left. Pt states the spot where there were 2 pulled she is having pain from that spot, no pain from the right side. Pt was re-evaluated for same by her dentist who told her there was nothing wrong with area. Dentist told her there was not signs of infection. Pt believes there is something going on in that area.

## 2016-07-04 NOTE — ED Triage Notes (Addendum)
Patient reports upper dental pain at extraction site ( dental extraction last week) unrelieved by prescription pain medications / no bleeding .

## 2016-07-05 MED ORDER — CLINDAMYCIN HCL 150 MG PO CAPS: 300.0000 mg | ORAL_CAPSULE | Freq: Four times a day (QID) | ORAL | 0 refills | Status: DC

## 2016-07-05 MED ORDER — CLINDAMYCIN HCL 150 MG PO CAPS
300.0000 mg | ORAL_CAPSULE | Freq: Once | ORAL | Status: AC
  Administered 2016-07-05: 300 mg via ORAL
  Filled 2016-07-05 (×2): qty 2

## 2016-08-28 ENCOUNTER — Encounter (HOSPITAL_COMMUNITY): Payer: Self-pay

## 2016-08-28 ENCOUNTER — Emergency Department (HOSPITAL_COMMUNITY)
Admission: EM | Admit: 2016-08-28 | Discharge: 2016-08-29 | Disposition: A | Discharge status: Home / Self Care | Payer: Medicaid Other | Attending: Emergency Medicine | Admitting: Emergency Medicine

## 2016-08-28 DIAGNOSIS — J020 Streptococcal pharyngitis: Secondary | ICD-10-CM | POA: Insufficient documentation

## 2016-08-28 LAB — CBC WITH DIFFERENTIAL/PLATELET
BASOS ABS: 0 10*3/uL (ref 0.0–0.1)
BASOS PCT: 0 %
EOS ABS: 0 10*3/uL (ref 0.0–0.7)
Eosinophils Relative: 0 %
HCT: 33.5 % — ABNORMAL LOW (ref 36.0–46.0)
HEMOGLOBIN: 10.9 g/dL — AB (ref 12.0–15.0)
Lymphocytes Relative: 8 %
Lymphs Abs: 1.2 10*3/uL (ref 0.7–4.0)
MCH: 22.9 pg — AB (ref 26.0–34.0)
MCHC: 32.5 g/dL (ref 30.0–36.0)
MCV: 70.4 fL — ABNORMAL LOW (ref 78.0–100.0)
MONO ABS: 0.9 10*3/uL (ref 0.1–1.0)
MONOS PCT: 6 %
NEUTROS ABS: 12.4 10*3/uL — AB (ref 1.7–7.7)
NEUTROS PCT: 86 %
PLATELETS: 245 10*3/uL (ref 150–400)
RBC: 4.76 MIL/uL (ref 3.87–5.11)
RDW: 15 % (ref 11.5–15.5)
WBC: 14.5 10*3/uL — ABNORMAL HIGH (ref 4.0–10.5)

## 2016-08-28 LAB — I-STAT CHEM 8, ED
BUN: 6 mg/dL (ref 6–20)
CALCIUM ION: 1.05 mmol/L — AB (ref 1.15–1.40)
Chloride: 101 mmol/L (ref 101–111)
Creatinine, Ser: 0.8 mg/dL (ref 0.44–1.00)
Glucose, Bld: 105 mg/dL — ABNORMAL HIGH (ref 65–99)
HEMATOCRIT: 35 % — AB (ref 36.0–46.0)
Hemoglobin: 11.9 g/dL — ABNORMAL LOW (ref 12.0–15.0)
Potassium: 3.4 mmol/L — ABNORMAL LOW (ref 3.5–5.1)
SODIUM: 136 mmol/L (ref 135–145)
TCO2: 23 mmol/L (ref 0–100)

## 2016-08-28 LAB — I-STAT BETA HCG BLOOD, ED (MC, WL, AP ONLY)

## 2016-08-28 LAB — RAPID STREP SCREEN (MED CTR MEBANE ONLY): STREPTOCOCCUS, GROUP A SCREEN (DIRECT): POSITIVE — AB

## 2016-08-28 MED ORDER — OXYCODONE-ACETAMINOPHEN 5-325 MG PO TABS: 1.0000 | ORAL_TABLET | Freq: Four times a day (QID) | ORAL | 0 refills | Status: DC | PRN

## 2016-08-28 MED ORDER — SODIUM CHLORIDE 0.9 % IV BOLUS (SEPSIS)
1000.0000 mL | Freq: Once | INTRAVENOUS | Status: AC
  Administered 2016-08-28: 1000 mL via INTRAVENOUS

## 2016-08-28 MED ORDER — AMOXICILLIN 500 MG PO CAPS: 500.0000 mg | ORAL_CAPSULE | Freq: Two times a day (BID) | ORAL | 0 refills | Status: DC

## 2016-08-28 MED ORDER — DEXAMETHASONE SODIUM PHOSPHATE 10 MG/ML IJ SOLN
10.0000 mg | Freq: Once | INTRAMUSCULAR | Status: AC
  Administered 2016-08-28: 10 mg via INTRAVENOUS
  Filled 2016-08-28: qty 1

## 2016-08-28 MED ORDER — ACETAMINOPHEN 325 MG PO TABS
650.0000 mg | ORAL_TABLET | Freq: Once | ORAL | Status: AC | PRN
  Administered 2016-08-28: 650 mg via ORAL
  Filled 2016-08-28: qty 2

## 2016-08-28 MED ORDER — PENICILLIN G BENZATHINE 1200000 UNIT/2ML IM SUSP: 1.2000 10*6.[IU] | Freq: Once | INTRAMUSCULAR | Status: DC

## 2016-08-28 MED ORDER — KETOROLAC TROMETHAMINE 30 MG/ML IJ SOLN
30.0000 mg | Freq: Once | INTRAMUSCULAR | Status: AC
  Administered 2016-08-28: 30 mg via INTRAVENOUS
  Filled 2016-08-28: qty 1

## 2016-08-28 MED ORDER — FENTANYL CITRATE (PF) 100 MCG/2ML IJ SOLN
50.0000 ug | Freq: Once | INTRAMUSCULAR | Status: AC
  Administered 2016-08-28: 50 ug via INTRAVENOUS
  Filled 2016-08-28: qty 2

## 2016-08-28 NOTE — ED Notes (Signed)
ED Provider at bedside. 

## 2016-08-28 NOTE — ED Triage Notes (Addendum)
Pt presents with c/o fever and sore throat that started 2 days ago and has gotten progressively worse. Pt reports she has also been vomiting. Pt is febrile at this time. Pt reports she feels like her throat is closing and she is having trouble breathing because of the throat swelling. Pt reports she is unable to swallow her spit.

## 2016-08-28 NOTE — ED Notes (Signed)
Pt says that she wants oral meds and not PCN, notified dr. Rubin Payorpickering

## 2016-08-28 NOTE — ED Provider Notes (Signed)
[facility]-EMERGENCY DEPT Provider Note   CSN: [id number] Arrival date & time: 08/28/16  2038     History   Chief Complaint Chief Complaint  Patient presents with  . Sore Throat  . Emesis    HPI [facility] is a 26 y.o. female.  HPI Patient presents with sore throat for the last 2 days. Started somewhat dull but now much more severe. Patient states she was having difficulty swallowing due to the pain. Has had fevers. States her son had pneumonia a month ago. Denies possibility of pregnancy because she has 3 children. No cough.    History reviewed. No pertinent past medical history.  There are no active problems to display for this patient.   Past Surgical History:  Procedure Laterality Date  . CESAREAN SECTION      OB History    No data available       Home Medications    Prior to Admission medications   Medication Sig Start Date End Date Taking? Authorizing Provider  clindamycin (CLEOCIN) 150 MG capsule Take 2 capsules (300 mg total) by mouth 4 (four) times daily. Patient not taking: Reported on 08/28/2016 07/05/16   Gilda Creasehristopher J Pollina, MD  ibuprofen (ADVIL,MOTRIN) 800 MG tablet Take 1 tablet (800 mg total) by mouth every 8 (eight) hours as needed. Patient not taking: Reported on 08/28/2016 03/20/16   [facility] Hospital[facility]ime Pilcher Ward, PA-C  naproxen (NAPROSYN) 500 MG tablet Take 1 tablet (500 mg total) by mouth 2 (two) times daily with a meal. Patient not taking: Reported on 08/28/2016 03/03/16   Fayrene HelperBowie Tran, PA-C  ondansetron (ZOFRAN) 4 MG tablet Take 1 tablet (4 mg total) by mouth every 8 (eight) hours as needed for nausea or vomiting. Patient not taking: Reported on 08/28/2016 02/15/16   Arthor CaptainAbigail Harris, PA-C  oxyCODONE-acetaminophen (PERCOCET/ROXICET) 5-325 MG tablet Take 1-2 tablets by mouth every 6 (six) hours as needed for severe pain. 08/28/16   Benjiman CoreNathan Deante Blough, MD  penicillin v potassium (VEETID) 500 MG tablet Take 1 tablet (500 mg total) by mouth 3 (three) times daily. Patient not  taking: Reported on 08/28/2016 03/03/16   Fayrene HelperBowie Tran, PA-C    Family History Family History  Problem Relation Age of Onset  . Cancer Other     Social History Social History  Substance Use Topics  . Smoking status: Never Smoker  . Smokeless tobacco: Never Used  . Alcohol use No     Allergies   Patient has no known allergies.   Review of Systems Review of Systems  Constitutional: Positive for appetite change and fever.  HENT: Positive for sore throat and trouble swallowing. Negative for congestion.   Eyes: Negative for photophobia.  Respiratory: Negative for shortness of breath.   Cardiovascular: Negative for chest pain.  Gastrointestinal: Negative for abdominal pain.  Genitourinary: Negative for enuresis and flank pain.  Musculoskeletal: Negative for back pain.  Neurological: Negative for tremors and numbness.  Psychiatric/Behavioral: Negative for confusion.     Physical Exam Updated Vital Signs BP (!) 107/46   Pulse 91   Temp 100.4 F (38 C) (Oral)   Resp 18   Ht 5\' 7"  (1.702 m)   Wt 220 lb (99.8 kg)   LMP 07/29/2016 (Approximate)   SpO2 100%   BMI 34.46 kg/m   Physical Exam  Constitutional: She appears well-developed.  HENT:  Head: Atraumatic.  Mouth/Throat: Oropharyngeal exudate present.  Posterior pharyngeal erythema with some mild exudate on right side.  Eyes: Pupils are equal, round, and reactive to light.  Neck: Neck supple. No tracheal deviation present.  Cardiovascular:  Tachycardia  Pulmonary/Chest: Effort normal.  Abdominal: Soft. There is no tenderness.  Musculoskeletal: She exhibits no edema.  Neurological: She is alert.  Skin: Skin is warm. Capillary refill takes less than 2 seconds.     ED Treatments / Results  Labs (all labs ordered are listed, but only abnormal results are displayed) Labs Reviewed  RAPID STREP SCREEN (NOT AT [facility]) - Abnormal; Notable for the following:       Result Value   Streptococcus, Group A Screen (Direct)  POSITIVE (*)    All other components within normal limits  CBC WITH DIFFERENTIAL/PLATELET - Abnormal; Notable for the following:    WBC 14.5 (*)    Hemoglobin 10.9 (*)    HCT 33.5 (*)    MCV 70.4 (*)    MCH 22.9 (*)    Neutro Abs 12.4 (*)    All other components within normal limits  I-STAT CHEM 8, ED - Abnormal; Notable for the following:    Potassium 3.4 (*)    Glucose, Bld 105 (*)    Calcium, Ion 1.05 (*)    Hemoglobin 11.9 (*)    HCT 35.0 (*)    All other components within normal limits  I-STAT BETA HCG BLOOD, ED ([facility], [facility], [facility] ONLY)    EKG  EKG Interpretation None       Radiology No results found.  Procedures Procedures (including critical care time)  Medications Ordered in ED Medications  penicillin g benzathine (BICILLIN LA) 1200000 UNIT/2ML injection 1.2 Million Units (not administered)  acetaminophen (TYLENOL) tablet 650 mg (650 mg Oral Given 08/28/16 2100)  sodium chloride 0.9 % bolus 1,000 mL (1,000 mLs Intravenous New Bag/Given 08/28/16 2211)  dexamethasone (DECADRON) injection 10 mg (10 mg Intravenous Given 08/28/16 2210)  fentaNYL (SUBLIMAZE) injection 50 mcg (50 mcg Intravenous Given 08/28/16 2211)  ketorolac (TORADOL) 30 MG/ML injection 30 mg (30 mg Intravenous Given 08/28/16 2210)     Initial Impression / Assessment and Plan / ED Course  I have reviewed the triage vital signs and the nursing notes.  Pertinent labs & imaging results that were available during my care of the patient were reviewed by me and considered in my medical decision making (see chart for details).     Patient with pharyngitis. Positive strep test. Initially difficult swallowing without clear peritonsillar abscess. Doubt retropharyngeal abscess or epiglottitis. Feels much better after treatment. Tolerated orals will discharge home.  Final Clinical Impressions(s) / ED Diagnoses   Final diagnoses:  Strep pharyngitis    New Prescriptions New Prescriptions   OXYCODONE-ACETAMINOPHEN  (PERCOCET/ROXICET) 5-325 MG TABLET    Take 1-2 tablets by mouth every 6 (six) hours as needed for severe pain.     Benjiman Core, MD 08/28/16 [phone]

## 2016-10-09 DIAGNOSIS — T23241A Burn of second degree of multiple right fingers (nail), including thumb, initial encounter: Secondary | ICD-10-CM | POA: Insufficient documentation

## 2016-10-09 HISTORY — DX: Burn of second degree of multiple right fingers (nail), including thumb, initial encounter: T23.241A

## 2016-10-12 ENCOUNTER — Encounter (HOSPITAL_COMMUNITY): Payer: Self-pay | Admitting: *Deleted

## 2016-10-12 ENCOUNTER — Emergency Department (HOSPITAL_COMMUNITY)
Admission: EM | Admit: 2016-10-12 | Discharge: 2016-10-12 | Disposition: A | Discharge status: Home / Self Care | Payer: Medicaid Other | Attending: Emergency Medicine | Admitting: Emergency Medicine

## 2016-10-12 DIAGNOSIS — Y93G9 Activity, other involving cooking and grilling: Secondary | ICD-10-CM | POA: Insufficient documentation

## 2016-10-12 DIAGNOSIS — Y999 Unspecified external cause status: Secondary | ICD-10-CM | POA: Insufficient documentation

## 2016-10-12 DIAGNOSIS — T23041A Burn of unspecified degree of multiple right fingers (nail), including thumb, initial encounter: Secondary | ICD-10-CM | POA: Insufficient documentation

## 2016-10-12 DIAGNOSIS — T300 Burn of unspecified body region, unspecified degree: Secondary | ICD-10-CM

## 2016-10-12 DIAGNOSIS — X102XXA Contact with fats and cooking oils, initial encounter: Secondary | ICD-10-CM | POA: Insufficient documentation

## 2016-10-12 DIAGNOSIS — Z79899 Other long term (current) drug therapy: Secondary | ICD-10-CM | POA: Insufficient documentation

## 2016-10-12 DIAGNOSIS — Y929 Unspecified place or not applicable: Secondary | ICD-10-CM | POA: Insufficient documentation

## 2016-10-12 MED ORDER — OXYCODONE-ACETAMINOPHEN 5-325 MG PO TABS
1.0000 | ORAL_TABLET | Freq: Once | ORAL | Status: AC
  Administered 2016-10-12: 1 via ORAL
  Filled 2016-10-12: qty 1
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
## 2016-11-23 ENCOUNTER — Emergency Department (HOSPITAL_COMMUNITY)
Admission: EM | Admit: 2016-11-23 | Discharge: 2016-11-24 | Disposition: A | Discharge status: Home / Self Care | Payer: Self-pay | Attending: Emergency Medicine | Admitting: Emergency Medicine

## 2016-11-23 ENCOUNTER — Encounter (HOSPITAL_COMMUNITY): Payer: Self-pay

## 2016-11-23 DIAGNOSIS — K8020 Calculus of gallbladder without cholecystitis without obstruction: Secondary | ICD-10-CM | POA: Insufficient documentation

## 2016-11-23 DIAGNOSIS — N9489 Other specified conditions associated with female genital organs and menstrual cycle: Secondary | ICD-10-CM | POA: Insufficient documentation

## 2016-11-23 DIAGNOSIS — Z331 Pregnant state, incidental: Secondary | ICD-10-CM | POA: Insufficient documentation

## 2016-11-23 DIAGNOSIS — R109 Unspecified abdominal pain: Secondary | ICD-10-CM

## 2016-11-23 DIAGNOSIS — Z3490 Encounter for supervision of normal pregnancy, unspecified, unspecified trimester: Secondary | ICD-10-CM

## 2016-11-23 LAB — WET PREP, GENITAL
Clue Cells Wet Prep HPF POC: NONE SEEN
Sperm: NONE SEEN
Trich, Wet Prep: NONE SEEN
YEAST WET PREP: NONE SEEN

## 2016-11-23 LAB — CBC
HCT: 36.1 % (ref 36.0–46.0)
Hemoglobin: 11.7 g/dL — ABNORMAL LOW (ref 12.0–15.0)
MCH: 23.8 pg — ABNORMAL LOW (ref 26.0–34.0)
MCHC: 32.4 g/dL (ref 30.0–36.0)
MCV: 73.4 fL — AB (ref 78.0–100.0)
PLATELETS: 253 10*3/uL (ref 150–400)
RBC: 4.92 MIL/uL (ref 3.87–5.11)
RDW: 16.6 % — ABNORMAL HIGH (ref 11.5–15.5)
WBC: 6 10*3/uL (ref 4.0–10.5)

## 2016-11-23 LAB — COMPREHENSIVE METABOLIC PANEL
ALT: 15 U/L (ref 14–54)
AST: 20 U/L (ref 15–41)
Albumin: 3.5 g/dL (ref 3.5–5.0)
Alkaline Phosphatase: 49 U/L (ref 38–126)
Anion gap: 8 (ref 5–15)
BUN: 5 mg/dL — ABNORMAL LOW (ref 6–20)
CHLORIDE: 104 mmol/L (ref 101–111)
CO2: 22 mmol/L (ref 22–32)
CREATININE: 0.69 mg/dL (ref 0.44–1.00)
Calcium: 8.7 mg/dL — ABNORMAL LOW (ref 8.9–10.3)
Glucose, Bld: 86 mg/dL (ref 65–99)
POTASSIUM: 3.6 mmol/L (ref 3.5–5.1)
Sodium: 134 mmol/L — ABNORMAL LOW (ref 135–145)
TOTAL PROTEIN: 7.2 g/dL (ref 6.5–8.1)
Total Bilirubin: 0.2 mg/dL — ABNORMAL LOW (ref 0.3–1.2)

## 2016-11-23 LAB — URINALYSIS, ROUTINE W REFLEX MICROSCOPIC
BILIRUBIN URINE: NEGATIVE
Glucose, UA: NEGATIVE mg/dL
Hgb urine dipstick: NEGATIVE
KETONES UR: NEGATIVE mg/dL
LEUKOCYTES UA: NEGATIVE
NITRITE: NEGATIVE
Protein, ur: NEGATIVE mg/dL
Specific Gravity, Urine: 1.006 (ref 1.005–1.030)
pH: 6 (ref 5.0–8.0)

## 2016-11-23 LAB — PREGNANCY, URINE: PREG TEST UR: POSITIVE — AB

## 2016-11-23 LAB — LIPASE, BLOOD: LIPASE: 30 U/L (ref 11–51)

## 2016-11-23 MED ORDER — HYDROCODONE-ACETAMINOPHEN 5-325 MG PO TABS
2.0000 | ORAL_TABLET | Freq: Once | ORAL | Status: AC
  Administered 2016-11-23: 2 via ORAL
  Filled 2016-11-23: qty 2

## 2016-11-23 NOTE — ED Triage Notes (Signed)
PT reports RUQ and LUQ pain x 6 days that comes and goes. Denies n/v/diarrhea. Pt reports she took pregnancy test at home that was negative

## 2016-11-23 NOTE — ED Provider Notes (Signed)
[facility]-EMERG[facility]Y DEPT Provider Note   CSN: [id number] Arrival date & time: 11/23/16  1935  By signing my name below, I, Doreatha Martin, attest that this documentation has been prepared under the direction and in the presence of Roxy Horseman, PA-C. Electronically Signed: Doreatha Martin, ED Scribe. 11/23/16. 9:18 PM.    History   Chief Complaint Chief Complaint  Patient presents with  . Abdominal Pain    HPI [facility] is a 26 y.o. female who presents to the Emergency Department complaining of moderate, intermittent,  sharp RUQ and LUQ pain for 7 days. She denies pain radiation to the back. Pt states her pain is unaffected by eating/drinking/moving, and randomly comes on. Pt reports her BMs are normal. She has h/o 3 C-sections, but no other abdominal surgeries. No h/o renal calculi. She denies fever, chills, nausea, vomiting, diarrhea, constipation, dysuria, vaginal discharge or bleeding.   The history is provided by the patient. No language interpreter was used.    History reviewed. No pertinent past medical history.  There are no active problems to display for this patient.   Past Surgical History:  Procedure Laterality Date  . CESAREAN SECTION      OB History    No data available       Home Medications    Prior to Admission medications   Medication Sig Start Date End Date Taking? Authorizing Provider  amoxicillin (AMOXIL) 500 MG capsule Take 1 capsule (500 mg total) by mouth 2 (two) times daily. 08/28/16   Benjiman Core, MD  clindamycin (CLEOCIN) 150 MG capsule Take 2 capsules (300 mg total) by mouth 4 (four) times daily. Patient not taking: Reported on 08/28/2016 07/05/16   Gilda Crease, MD  ibuprofen (ADVIL,MOTRIN) 800 MG tablet Take 1 tablet (800 mg total) by mouth every 8 (eight) hours as needed. Patient not taking: Reported on 08/28/2016 03/20/16   [facility] Ward, PA-C  naproxen (NAPROSYN) 500 MG tablet Take 1 tablet (500 mg total) by mouth 2 (two) times  daily with a meal. Patient not taking: Reported on 08/28/2016 03/03/16   Fayrene Helper, PA-C  ondansetron (ZOFRAN) 4 MG tablet Take 1 tablet (4 mg total) by mouth every 8 (eight) hours as needed for nausea or vomiting. Patient not taking: Reported on 08/28/2016 02/15/16   Arthor Captain, PA-C  oxyCODONE-acetaminophen (PERCOCET/ROXICET) 5-325 MG tablet Take 1-2 tablets by mouth every 6 (six) hours as needed for severe pain. 08/28/16   Benjiman Core, MD  penicillin v potassium (VEETID) 500 MG tablet Take 1 tablet (500 mg total) by mouth 3 (three) times daily. Patient not taking: Reported on 08/28/2016 03/03/16   Fayrene Helper, PA-C    Family History Family History  Problem Relation Age of Onset  . Cancer Other     Social History Social History  Substance Use Topics  . Smoking status: Never Smoker  . Smokeless tobacco: Never Used  . Alcohol use No     Allergies   Patient has no known allergies.   Review of Systems Review of Systems  Constitutional: Negative for chills and fever.  HENT: Negative for congestion.   Respiratory: Negative for shortness of breath.   Cardiovascular: Negative for chest pain.  Gastrointestinal: Positive for abdominal pain. Negative for constipation, diarrhea, nausea and vomiting.  Genitourinary: Negative for dysuria, vaginal bleeding and vaginal discharge.  Musculoskeletal: Negative for back pain.  Skin: Negative for rash.  Allergic/Immunologic: Negative for immunocompromised state.  Neurological: Negative for headaches.     Physical Exam Updated Vital  Signs BP 130/75 (BP Location: Left Arm)   Pulse 81   Temp 98.2 F (36.8 C) (Oral)   Resp 18   Ht  (1.702 m)   Wt 215 lb (97.5 kg)   LMP 11/03/2016   SpO2 100%   BMI 33.67 kg/m   Physical Exam  Constitutional: She appears well-developed and well-nourished.  HENT:  Head: Normocephalic.  Eyes: Conjunctivae are normal.  Cardiovascular: Normal rate, regular rhythm and normal heart sounds.     Pulmonary/Chest: Effort normal and breath sounds normal. No respiratory distress. She has no wheezes. She has no rales.  Abdominal: Soft. Bowel sounds are normal. She exhibits no distension and no mass. There is tenderness. There is no rebound and no guarding. No hernia.  TTP in RUQ. No other focal abdominal tenderness.   Genitourinary:  Genitourinary Comments: Pelvic exam chaperoned by female ER tech, no right or left adnexal tenderness, no uterine tenderness, no vaginal discharge, no bleeding, no CMT or friability, no foreign body, no injury to the external genitalia, no other significant findings. Os closed. Chaperone present throughout entire exam.    Musculoskeletal: Normal range of motion.  Neurological: She is alert.  Skin: Skin is warm and dry.  Psychiatric: She has a normal mood and affect. Her behavior is normal.  Nursing note and vitals reviewed.    ED Treatments / Results   DIAGNOSTIC STUDIES: Oxygen Saturation is 100% on RA, normal by my interpretation.    COORDINATION OF CARE: 9:02 PM Discussed treatment plan with pt at bedside which includes labs, [place] and pt agreed to plan.    Labs (all labs ordered are listed, but only abnormal results are displayed) Labs Reviewed  COMPREHENSIVE METABOLIC PANEL - Abnormal; Notable for the following:       Result Value   Sodium 134 (*)    BUN 5 (*)    Calcium 8.7 (*)    Total Bilirubin 0.2 (*)    All other components within normal limits  CBC - Abnormal; Notable for the following:    Hemoglobin 11.7 (*)    MCV 73.4 (*)    MCH 23.8 (*)    RDW 16.6 (*)    All other components within normal limits  URINALYSIS, ROUTINE W REFLEX MICROSCOPIC - Abnormal; Notable for the following:    Color, Urine STRAW (*)    All other components within normal limits  PREGNANCY, URINE - Abnormal; Notable for the following:    Preg Test, Ur POSITIVE (*)    All other components within normal limits  LIPASE, BLOOD    EKG  EKG  Interpretation None       Radiology No results found.  Procedures Procedures (including critical care time)  Medications Ordered in ED Medications  HYDROcodone-acetaminophen (NORCO/VICODIN) 5-325 MG per tablet 2 tablet (2 tablets Oral Given 11/23/16 2112)     Initial Impression / Assessment and Plan / ED Course  I have reviewed the triage vital signs and the nursing notes.  Pertinent labs & imaging results that were available during my care of the patient were reviewed by me and considered in my medical decision making (see chart for details).     Patient with crampy abdominal pain x 2 weeks.  Pregnancy test positive.  Notified patient.  Pelvic exam remarkable for some mild tenderness over the uterus, but also with significant tenderness in the RUQ.  Will check [place] of RUQ and OB/transvag.  LFTs are normal.  Afebrile.  VSS.  Labs are reassuring.  Quant still pending.  Patient signed out to Chad, [place], who will continue care.  Doubt acute process, and suspect that patient can be discharged to home.  If no IUP seen on [place], will need repeat quant in 48 hours.  No vaginal bleeding.  RH not indicated.  Final Clinical Impressions(s) / ED Diagnoses   Final diagnoses:  None    New Prescriptions New Prescriptions   No medications on file   I personally performed the services described in this documentation, which was scribed in my presence. The recorded information has been reviewed and is accurate.      Roxy Horseman, PA-C 11/24/16 7829    Derwood Kaplan, MD 11/25/16 5621

## 2016-11-23 NOTE — ED Notes (Signed)
Pelvic cart at bedside. 

## 2016-11-24 ENCOUNTER — Emergency Department (HOSPITAL_COMMUNITY): Payer: Self-pay

## 2016-11-24 LAB — GC/CHLAMYDIA PROBE AMP (~~LOC~~) NOT AT ARMC
CHLAMYDIA, DNA PROBE: NEGATIVE
Neisseria Gonorrhea: NEGATIVE

## 2016-11-24 LAB — HCG, QUANTITATIVE, PREGNANCY: HCG, BETA CHAIN, QUANT, S: 30425 m[IU]/mL — AB (ref <5)

## 2016-11-24 MED ORDER — PRENATAL COMPLETE 14-0.4 MG PO TABS: 1.0000 | ORAL_TABLET | Freq: Every day | ORAL | 5 refills | Status: DC

## 2016-11-24 NOTE — ED Provider Notes (Signed)
1:01 AM Patient signed out to me at change of shift from [employer], [place].  Pt with several weeks of lower abdominal pain, found to be pregnant.  No vaginal bleeding.  Os closed on exam.  Also tender in RUQ.  Clinically doubt cholecystitis.  Pending HCG quant, OB US, RUQ [place].    2:21 AM Pt sitting upright, calm and comfortable.  Discussed all results with her and her significant other (with her permission).  Pt with cholelithiasis and also IUP, 103w3d.  Discussed follow up and reasons for immediate return.  Discussed result, findings, treatment, and follow up  with patient.  Pt given return precautions.  Pt verbalizes understanding and agrees with plan.      Results for orders placed or performed during the hospital encounter of 11/23/16  Wet prep, genital  Result Value Ref Range   Yeast Wet Prep HPF POC NONE SEEN NONE SEEN   Trich, Wet Prep NONE SEEN NONE SEEN   Clue Cells Wet Prep HPF POC NONE SEEN NONE SEEN   WBC, Wet Prep HPF POC MODERATE (A) NONE SEEN   Sperm NONE SEEN   Lipase, blood  Result Value Ref Range   Lipase 30 11 - 51 U/L  Comprehensive metabolic panel  Result Value Ref Range   Sodium 134 (L) 135 - 145 mmol/L   Potassium 3.6 3.5 - 5.1 mmol/L   Chloride 104 101 - 111 mmol/L   CO2 22 22 - 32 mmol/L   Glucose, Bld 86 65 - 99 mg/dL   BUN 5 (L) 6 - 20 mg/dL   Creatinine, Ser 7.82 0.44 - 1.00 mg/dL   Calcium 8.7 (L) 8.9 - 10.3 mg/dL   Total Protein 7.2 6.5 - 8.1 g/dL   Albumin 3.5 3.5 - 5.0 g/dL   AST 20 15 - 41 U/L   ALT 15 14 - 54 U/L   Alkaline Phosphatase 49 38 - 126 U/L   Total Bilirubin 0.2 (L) 0.3 - 1.2 mg/dL   GFR calc non Af Amer >60 >60 mL/min   GFR calc Af Amer >60 >60 mL/min   Anion gap 8 5 - 15  CBC  Result Value Ref Range   WBC 6.0 4.0 - 10.5 K/uL   RBC 4.92 3.87 - 5.11 MIL/uL   Hemoglobin 11.7 (L) 12.0 - 15.0 g/dL   HCT 95.6 21.3 - 08.6 %   MCV 73.4 (L) 78.0 - 100.0 fL   MCH 23.8 (L) 26.0 - 34.0 pg   MCHC 32.4 30.0 - 36.0 g/dL   RDW 57.8 (H) 46.9 -  15.5 %   Platelets 253 150 - 400 K/uL  Urinalysis, Routine w reflex microscopic  Result Value Ref Range   Color, Urine STRAW (A) YELLOW   APPearance CLEAR CLEAR   Specific Gravity, Urine 1.006 1.005 - 1.030   pH 6.0 5.0 - 8.0   Glucose, UA NEGATIVE NEGATIVE mg/dL   Hgb urine dipstick NEGATIVE NEGATIVE   Bilirubin Urine NEGATIVE NEGATIVE   Ketones, ur NEGATIVE NEGATIVE mg/dL   Protein, ur NEGATIVE NEGATIVE mg/dL   Nitrite NEGATIVE NEGATIVE   Leukocytes, UA NEGATIVE NEGATIVE  Pregnancy, urine  Result Value Ref Range   Preg Test, Ur POSITIVE (A) NEGATIVE  hCG, quantitative, pregnancy  Result Value Ref Range   hCG, Beta Chain, Quant, S 30,425 (H) <5 mIU/mL   US Ob Comp Less 14 Wks  Result Date: 11/24/2016 CLINICAL DATA:  Pelvic pain pelvi EXAM: OBSTETRIC <14 WK [place] AND TRANSVAGINAL OB US TECHNIQUE: Both  transabdominal and transvaginal ultrasound examinations were performed for complete evaluation of the gestation as well as the maternal uterus, adnexal regions, and pelvic cul-de-sac. Transvaginal technique was performed to assess early pregnancy. COMPARISON:  None. FINDINGS: Intrauterine gestational sac: Single intrauterine gestational sac Yolk sac:  Visualized Embryo:  Not visualized Cardiac Activity: Not visualized MSD: 16  mm   6 w   3  d Subchorionic hemorrhage:  None visualized. Maternal uterus/adnexae: Bilateral ovaries are within normal limits. Left ovary measures 3.2 x 2.3 x 2 cm. Right ovary measures 2.2 x 1.3 x 1.4 cm. IMPRESSION: 1. Intrauterine pregnancy with visualization of a gestational sac and yolk sac; fetal pole not yet identified. Sonographic follow-up as clinically indicated. 2. No other abnormalities are visualized. Electronically Signed   By: Jasmine Pang M.D.   On: 11/24/2016 02:06   US Ob Transvaginal  Result Date: 11/24/2016 CLINICAL DATA:  Pelvic pain pelvi EXAM: OBSTETRIC <14 WK [place] AND TRANSVAGINAL OB US TECHNIQUE: Both transabdominal and transvaginal ultrasound  examinations were performed for complete evaluation of the gestation as well as the maternal uterus, adnexal regions, and pelvic cul-de-sac. Transvaginal technique was performed to assess early pregnancy. COMPARISON:  None. FINDINGS: Intrauterine gestational sac: Single intrauterine gestational sac Yolk sac:  Visualized Embryo:  Not visualized Cardiac Activity: Not visualized MSD: 16  mm   6 w   3  d Subchorionic hemorrhage:  None visualized. Maternal uterus/adnexae: Bilateral ovaries are within normal limits. Left ovary measures 3.2 x 2.3 x 2 cm. Right ovary measures 2.2 x 1.3 x 1.4 cm. IMPRESSION: 1. Intrauterine pregnancy with visualization of a gestational sac and yolk sac; fetal pole not yet identified. Sonographic follow-up as clinically indicated. 2. No other abnormalities are visualized. Electronically Signed   By: Jasmine Pang M.D.   On: 11/24/2016 02:06   US Abdomen Limited  Result Date: 11/24/2016 CLINICAL DATA:  Right upper quadrant abdominal pain EXAM: US ABDOMEN LIMITED - RIGHT UPPER QUADRANT COMPARISON:  None. FINDINGS: Gallbladder: Calcified stone in the gallbladder measuring up to 6 mm. Negative sonographic Murphy. Normal wall thickness. Common bile duct: Diameter: 4.2 mm Liver: No focal lesion identified. Within normal limits in parenchymal echogenicity. IMPRESSION: 1. Cholelithiasis without sonographic evidence for acute cholecystitis or biliary dilatation Electronically Signed   By: Jasmine Pang M.D.   On: 11/24/2016 02:01      Trixie Dredge, PA-C 11/24/16 0222

## 2016-11-24 NOTE — ED Notes (Signed)
Patient returned from Ultrasound and brought to POD A.  Patient and family educated about plan to wait on results and discharge papers.  No distress noted at this time.

## 2016-11-24 NOTE — ED Notes (Signed)
Pt departed in NAD, refused use of wheelchair.  

## 2016-11-24 NOTE — Discharge Instructions (Signed)
Read the information below.  You may return to the Emergency Department at any time for worsening condition or any new symptoms that concern you.  Take tylenol only as needed for pain.   If you develop high fevers, worsening abdominal pain, uncontrolled vomiting, or are unable to tolerate fluids by mouth, return to the ER for a recheck.

## 2016-11-27 ENCOUNTER — Encounter (HOSPITAL_COMMUNITY): Payer: Self-pay | Admitting: Emergency Medicine

## 2016-11-27 DIAGNOSIS — Z3A01 Less than 8 weeks gestation of pregnancy: Secondary | ICD-10-CM | POA: Insufficient documentation

## 2016-11-27 DIAGNOSIS — O99611 Diseases of the digestive system complicating pregnancy, first trimester: Secondary | ICD-10-CM | POA: Diagnosis not present

## 2016-11-27 DIAGNOSIS — O26891 Other specified pregnancy related conditions, first trimester: Secondary | ICD-10-CM | POA: Diagnosis present

## 2016-11-27 DIAGNOSIS — K8020 Calculus of gallbladder without cholecystitis without obstruction: Secondary | ICD-10-CM | POA: Diagnosis not present

## 2016-11-27 MED ORDER — ONDANSETRON 4 MG PO TBDP
4.0000 mg | ORAL_TABLET | Freq: Once | ORAL | Status: AC | PRN
  Administered 2016-11-27: 4 mg via ORAL
  Filled 2016-11-27: qty 1

## 2016-11-27 NOTE — ED Triage Notes (Signed)
Pt comes in with complaints of worsening emesis and abdominal pain since her visit [facility] on 4/[facility].  Pt states she found out at that visit that she was [redacted] weeks pregnant and had gallstones.  Pt states she has not been able to hold anything down.

## 2016-11-28 ENCOUNTER — Emergency Department (HOSPITAL_COMMUNITY)
Admission: EM | Admit: 2016-11-28 | Discharge: 2016-11-28 | Disposition: A | Discharge status: Home / Self Care | Payer: Medicaid Other | Attending: Emergency Medicine | Admitting: Emergency Medicine

## 2016-11-28 DIAGNOSIS — Z3A01 Less than 8 weeks gestation of pregnancy: Secondary | ICD-10-CM

## 2016-11-28 DIAGNOSIS — K8020 Calculus of gallbladder without cholecystitis without obstruction: Secondary | ICD-10-CM

## 2016-11-28 LAB — COMPREHENSIVE METABOLIC PANEL
ALK PHOS: 49 U/L (ref 38–126)
ALT: 16 U/L (ref 14–54)
ANION GAP: 9 (ref 5–15)
AST: 18 U/L (ref 15–41)
Albumin: 3.8 g/dL (ref 3.5–5.0)
BILIRUBIN TOTAL: 0.4 mg/dL (ref 0.3–1.2)
BUN: 8 mg/dL (ref 6–20)
CALCIUM: 9.1 mg/dL (ref 8.9–10.3)
CO2: 23 mmol/L (ref 22–32)
CREATININE: 0.74 mg/dL (ref 0.44–1.00)
Chloride: 104 mmol/L (ref 101–111)
GFR calc non Af Amer: >60 mL/min (ref >60)
GLUCOSE: 117 mg/dL — AB (ref 65–99)
Potassium: 3.5 mmol/L (ref 3.5–5.1)
Sodium: 136 mmol/L (ref 135–145)
TOTAL PROTEIN: 7.8 g/dL (ref 6.5–8.1)

## 2016-11-28 LAB — CBC
HCT: 37 % (ref 36.0–46.0)
HEMOGLOBIN: 12.1 g/dL (ref 12.0–15.0)
MCH: 23.6 pg — AB (ref 26.0–34.0)
MCHC: 32.7 g/dL (ref 30.0–36.0)
MCV: 72.3 fL — ABNORMAL LOW (ref 78.0–100.0)
PLATELETS: 256 10*3/uL (ref 150–400)
RBC: 5.12 MIL/uL — AB (ref 3.87–5.11)
RDW: 15.8 % — ABNORMAL HIGH (ref 11.5–15.5)
WBC: 6.7 10*3/uL (ref 4.0–10.5)

## 2016-11-28 LAB — URINALYSIS, ROUTINE W REFLEX MICROSCOPIC
BILIRUBIN URINE: NEGATIVE
Glucose, UA: NEGATIVE mg/dL
HGB URINE DIPSTICK: NEGATIVE
Ketones, ur: 5 mg/dL — AB
Leukocytes, UA: NEGATIVE
NITRITE: NEGATIVE
PROTEIN: NEGATIVE mg/dL
SPECIFIC GRAVITY, URINE: 1.028 (ref 1.005–1.030)
pH: 6 (ref 5.0–8.0)

## 2016-11-28 LAB — I-STAT BETA HCG BLOOD, ED (MC, WL, AP ONLY)

## 2016-11-28 LAB — LIPASE, BLOOD: Lipase: 34 U/L (ref 11–51)

## 2016-11-28 MED ORDER — ONDANSETRON 8 MG PO TBDP
8.0000 mg | ORAL_TABLET | Freq: Once | ORAL | Status: AC
  Administered 2016-11-28: 8 mg via ORAL
  Filled 2016-11-28: qty 1

## 2016-11-28 MED ORDER — DOXYLAMINE-PYRIDOXINE 10-10 MG PO TBEC: 1.0000 | DELAYED_RELEASE_TABLET | ORAL | 0 refills | Status: DC

## 2016-11-28 MED ORDER — FAMOTIDINE 20 MG PO TABS: 20.0000 mg | ORAL_TABLET | Freq: Two times a day (BID) | ORAL | 0 refills | Status: DC

## 2016-11-28 NOTE — Discharge Instructions (Signed)
Take your medications as prescribed. I recommend using a bland diet until her symptoms have improved. Call the general surgery clinic listed below to schedule a follow-up appointment regarding your gallstones. I recommend calling the OB/GYN office below to schedule a follow-up appointment. Please return to the Emergency Department if symptoms worsen or new onset of fever, chest pain, difficulty breathing, new/worsening abdominal pain, vomiting, unable to keep fluids down.

## 2016-11-28 NOTE — ED Notes (Signed)
Unable to assess fetal heart tones with doppler or fetal monitor at this time.

## 2016-11-28 NOTE — ED Provider Notes (Signed)
[facility]-EMERGENCY DEPT Provider Note   CSN: [id number] Arrival date & time: 11/27/16  2338     History   Chief Complaint Chief Complaint  Patient presents with  . Emesis  . Abdominal Pain    HPI Cecilie KicksDestiny Decicco is a 26 y.o. female.  HPI   Patient is a 26 year old female with no pertinent past medical history presents the ED with complaint of right upper quadrant abdominal pain. Patient reports for the past week she has had constant gradually worsening cramping pain to her right upper quadrant with associated nausea and vomiting. She notes she was seen in the ED on 11/23/16 for similar symptoms, was told she was [redacted] weeks pregnant and had gallstones and was discharged home. Patient denies taking any medications at home for her symptoms. She states she is in the process of setting up follow-up appointment with OB/GYN. Denies fever, chills, headache, cough, shortness of breath, chest pain, hematemesis, diarrhea, urinary symptoms, vaginal bleeding or discharge. Endorses surgical hx of 3 c-sections.   History reviewed. No pertinent past medical history.  There are no active problems to display for this patient.   Past Surgical History:  Procedure Laterality Date  . CESAREAN SECTION      OB History    Gravida Para Term Preterm AB Living   1             SAB TAB Ectopic Multiple Live Births                   Home Medications    Prior to Admission medications   Medication Sig Start Date End Date Taking? Authorizing Provider  Doxylamine-Pyridoxine 10-10 MG TBEC Take 1 tablet by mouth as directed. Take 1 tablet in morning and 2 tablets at bedtime. 11/28/16   Barrett HenleNicole Elizabeth Nadeau, PA-C  famotidine (PEPCID) 20 MG tablet Take 1 tablet (20 mg total) by mouth 2 (two) times daily. 11/28/16   Barrett HenleNicole Elizabeth Nadeau, PA-C  Prenatal Vit-Fe Fumarate-FA (PRENATAL COMPLETE) 14-0.4 MG TABS Take 1 tablet by mouth daily. Patient not taking: Reported on 11/28/2016 11/24/16   Trixie DredgeEmily West, PA-C     Family History Family History  Problem Relation Age of Onset  . Cancer Other     Social History Social History  Substance Use Topics  . Smoking status: Never Smoker  . Smokeless tobacco: Never Used  . Alcohol use No     Allergies   Patient has no known allergies.   Review of Systems Review of Systems  Gastrointestinal: Positive for abdominal pain, nausea and vomiting.  All other systems reviewed and are negative.    Physical Exam Updated Vital Signs BP 138/89 (BP Location: Right Arm)   Pulse 76   Temp 98.6 F (37 C) (Oral)   Resp 16   Ht 5\' 7"  (1.702 m)   Wt 97.5 kg   LMP 11/03/2016   SpO2 99%   BMI 33.67 kg/m   Physical Exam  Constitutional: She is oriented to person, place, and time. She appears well-developed and well-nourished. No distress.  HENT:  Head: Normocephalic and atraumatic.  Mouth/Throat: Uvula is midline, oropharynx is clear and moist and mucous membranes are normal. No oropharyngeal exudate, posterior oropharyngeal edema, posterior oropharyngeal erythema or tonsillar abscesses. No tonsillar exudate.  Eyes: Conjunctivae and EOM are normal. Right eye exhibits no discharge. Left eye exhibits no discharge. No scleral icterus.  Neck: Normal range of motion. Neck supple.  Cardiovascular: Normal rate, regular rhythm, normal heart sounds and intact distal pulses.  Pulmonary/Chest: Effort normal and breath sounds normal. No respiratory distress. She has no wheezes. She has no rales. She exhibits no tenderness.  Abdominal: Soft. Normal appearance and bowel sounds are normal. She exhibits no distension and no mass. There is tenderness in the right upper quadrant and epigastric area. There is no rigidity, no rebound, no guarding and no CVA tenderness. No hernia.  Musculoskeletal: Normal range of motion. She exhibits no edema.  Neurological: She is alert and oriented to person, place, and time.  Skin: Skin is warm and dry. She is not diaphoretic.   Nursing note and vitals reviewed.    ED Treatments / Results  Labs (all labs ordered are listed, but only abnormal results are displayed) Labs Reviewed  COMPREHENSIVE METABOLIC PANEL - Abnormal; Notable for the following:       Result Value   Glucose, Bld 117 (*)    All other components within normal limits  CBC - Abnormal; Notable for the following:    RBC 5.12 (*)    MCV 72.3 (*)    MCH 23.6 (*)    RDW 15.8 (*)    All other components within normal limits  URINALYSIS, ROUTINE W REFLEX MICROSCOPIC - Abnormal; Notable for the following:    Ketones, ur 5 (*)    All other components within normal limits  I-STAT BETA HCG BLOOD, ED ([facility], [facility], [facility] ONLY) - Abnormal; Notable for the following:    I-stat hCG, quantitative >2,000.0 (*)    All other components within normal limits  LIPASE, BLOOD    EKG  EKG Interpretation None       Radiology No results found.  Procedures Procedures (including critical care time)  Medications Ordered in ED Medications  ondansetron (ZOFRAN-ODT) disintegrating tablet 4 mg (4 mg Oral Given 11/27/16 2355)  ondansetron (ZOFRAN-ODT) disintegrating tablet 8 mg (8 mg Oral Given 11/28/16 1610)     Initial Impression / Assessment and Plan / ED Course  I have reviewed the triage vital signs and the nursing notes.  Pertinent labs & imaging results that were available during my care of the patient were reviewed by me and considered in my medical decision making (see chart for details).     Pt presents with continued right upper quadrant abdominal pain with associated nausea and vomiting. Denies fever. VSS. Exam revealed tenderness over right upper quadrant, no peritoneal signs. Remaining exam unremarkable. Patient requesting to have only by mouth meds at this time and states she does not want an IV. Chart review shows patient was seen in the ED on 11/23/16 for similar symptoms, during ED visit patient was found to be pregnant at [redacted] weeks and 3 days and was  noted to have gallstones without evidence of acute cholecystitis. Plan to d/c pt home with antiemetics and symptomatic tx. Pt tolerating PO. Pt given info to follow up with [facility] outpatient regarding gallstones, and OB regarding pregnancy. Discussed return precautions.   Final Clinical Impressions(s) / ED Diagnoses   Final diagnoses:  Calculus of gallbladder without cholecystitis without obstruction  Less than [redacted] weeks gestation of pregnancy    New Prescriptions New Prescriptions   DOXYLAMINE-PYRIDOXINE 10-10 MG TBEC    Take 1 tablet by mouth as directed. Take 1 tablet in morning and 2 tablets at bedtime.   FAMOTIDINE (PEPCID) 20 MG TABLET    Take 1 tablet (20 mg total) by mouth 2 (two) times daily.     Satira Sark [place], [place] 11/28/16 9604    April Palumbo, MD  11/28/16 0731  

## 2016-12-27 ENCOUNTER — Emergency Department (HOSPITAL_COMMUNITY)
Admission: EM | Admit: 2016-12-27 | Discharge: 2016-12-27 | Disposition: A | Discharge status: Home / Self Care | Payer: Medicaid Other | Attending: Emergency Medicine | Admitting: Emergency Medicine

## 2016-12-27 ENCOUNTER — Encounter (HOSPITAL_COMMUNITY): Payer: Self-pay | Admitting: Emergency Medicine

## 2016-12-27 DIAGNOSIS — O0281 Inappropriate change in quantitative human chorionic gonadotropin (hCG) in early pregnancy: Secondary | ICD-10-CM | POA: Insufficient documentation

## 2016-12-27 DIAGNOSIS — Z79899 Other long term (current) drug therapy: Secondary | ICD-10-CM | POA: Insufficient documentation

## 2016-12-27 DIAGNOSIS — O210 Mild hyperemesis gravidarum: Secondary | ICD-10-CM | POA: Insufficient documentation

## 2016-12-27 DIAGNOSIS — O209 Hemorrhage in early pregnancy, unspecified: Secondary | ICD-10-CM | POA: Diagnosis not present

## 2016-12-27 DIAGNOSIS — Z3A12 12 weeks gestation of pregnancy: Secondary | ICD-10-CM | POA: Insufficient documentation

## 2016-12-27 DIAGNOSIS — O4690 Antepartum hemorrhage, unspecified, unspecified trimester: Secondary | ICD-10-CM

## 2016-12-27 LAB — COMPREHENSIVE METABOLIC PANEL
ALT: 13 U/L — ABNORMAL LOW (ref 14–54)
AST: 17 U/L (ref 15–41)
Albumin: 2.9 g/dL — ABNORMAL LOW (ref 3.5–5.0)
Alkaline Phosphatase: 49 U/L (ref 38–126)
Anion gap: 7 (ref 5–15)
BUN: 5 mg/dL — ABNORMAL LOW (ref 6–20)
CO2: 21 mmol/L — ABNORMAL LOW (ref 22–32)
Calcium: 8.8 mg/dL — ABNORMAL LOW (ref 8.9–10.3)
Chloride: 106 mmol/L (ref 101–111)
Creatinine, Ser: 0.64 mg/dL (ref 0.44–1.00)
GFR calc Af Amer: >60 mL/min (ref >60)
GFR calc non Af Amer: >60 mL/min (ref >60)
Glucose, Bld: 96 mg/dL (ref 65–99)
Potassium: 3.4 mmol/L — ABNORMAL LOW (ref 3.5–5.1)
Sodium: 134 mmol/L — ABNORMAL LOW (ref 135–145)
Total Bilirubin: 0.1 mg/dL — ABNORMAL LOW (ref 0.3–1.2)
Total Protein: 6.6 g/dL (ref 6.5–8.1)

## 2016-12-27 LAB — CBC WITH DIFFERENTIAL/PLATELET
Basophils Absolute: 0 10*3/uL (ref 0.0–0.1)
Basophils Relative: 0 %
Eosinophils Absolute: 0 10*3/uL (ref 0.0–0.7)
Eosinophils Relative: 0 %
HCT: 35.4 % — ABNORMAL LOW (ref 36.0–46.0)
Hemoglobin: 11.5 g/dL — ABNORMAL LOW (ref 12.0–15.0)
Lymphocytes Relative: 29 %
Lymphs Abs: 1.8 10*3/uL (ref 0.7–4.0)
MCH: 23.7 pg — ABNORMAL LOW (ref 26.0–34.0)
MCHC: 32.5 g/dL (ref 30.0–36.0)
MCV: 73 fL — ABNORMAL LOW (ref 78.0–100.0)
Monocytes Absolute: 0.4 10*3/uL (ref 0.1–1.0)
Monocytes Relative: 6 %
Neutro Abs: 3.9 10*3/uL (ref 1.7–7.7)
Neutrophils Relative %: 65 %
Platelets: 236 10*3/uL (ref 150–400)
RBC: 4.85 MIL/uL (ref 3.87–5.11)
RDW: 15.4 % (ref 11.5–15.5)
WBC: 6.1 10*3/uL (ref 4.0–10.5)

## 2016-12-27 LAB — URINALYSIS, ROUTINE W REFLEX MICROSCOPIC
Bilirubin Urine: NEGATIVE
Glucose, UA: NEGATIVE mg/dL
Ketones, ur: NEGATIVE mg/dL
Leukocytes, UA: NEGATIVE
Nitrite: NEGATIVE
Protein, ur: NEGATIVE mg/dL
Specific Gravity, Urine: 1.024 (ref 1.005–1.030)
pH: 7 (ref 5.0–8.0)

## 2016-12-27 LAB — WET PREP, GENITAL
Trich, Wet Prep: NONE SEEN
Yeast Wet Prep HPF POC: NONE SEEN

## 2016-12-27 LAB — I-STAT BETA HCG BLOOD, ED (MC, WL, AP ONLY): I-stat hCG, quantitative: >2000 m[IU]/mL — ABNORMAL HIGH (ref <5)

## 2016-12-27 LAB — HCG, QUANTITATIVE, PREGNANCY: hCG, Beta Chain, Quant, S: 136845 m[IU]/mL — ABNORMAL HIGH (ref <5)

## 2016-12-27 LAB — LIPASE, BLOOD: Lipase: 23 U/L (ref 11–51)

## 2016-12-27 MED ORDER — METRONIDAZOLE 500 MG PO TABS: 500.0000 mg | ORAL_TABLET | Freq: Two times a day (BID) | ORAL | 0 refills | Status: DC

## 2016-12-27 MED ORDER — DOXYLAMINE-PYRIDOXINE 10-10 MG PO TBEC: 1.0000 | DELAYED_RELEASE_TABLET | Freq: Three times a day (TID) | ORAL | 0 refills | Status: DC | PRN

## 2016-12-27 MED ORDER — SODIUM CHLORIDE 0.9 % IV BOLUS (SEPSIS)
1000.0000 mL | Freq: Once | INTRAVENOUS | Status: AC
  Administered 2016-12-27: 1000 mL via INTRAVENOUS

## 2016-12-27 MED ORDER — PROMETHAZINE HCL 25 MG PO TABS: 25.0000 mg | ORAL_TABLET | Freq: Four times a day (QID) | ORAL | 0 refills | Status: DC | PRN

## 2016-12-27 MED ORDER — PROMETHAZINE HCL 25 MG PO TABS
12.5000 mg | ORAL_TABLET | Freq: Once | ORAL | Status: AC
  Administered 2016-12-27: 12.5 mg via ORAL
  Filled 2016-12-27: qty 1

## 2016-12-27 MED ORDER — ONDANSETRON HCL 4 MG/2ML IJ SOLN
4.0000 mg | Freq: Once | INTRAMUSCULAR | Status: AC
  Administered 2016-12-27: 4 mg via INTRAVENOUS
  Filled 2016-12-27: qty 2

## 2016-12-27 NOTE — ED Notes (Signed)
Pt reports scant amount of brown vaginal discharge after intercourse, pt reports 1st intercourse since finding out she was pregnant, pt reports she is 12 weeks. Pt needs an OB referral. Pt also reports "vomiting all day every day"

## 2016-12-27 NOTE — ED Triage Notes (Addendum)
Pt c/o abd pain  - dx recently with gallstones-- is [redacted] weeks pregnant-- has lost approx 10 # in a month, due to vomiting. Needs referral to OB dr.  Pt also c/o vag bleeding, brownish blood, scant--

## 2016-12-27 NOTE — ED Provider Notes (Signed)
[facility]-EMERGENCY DEPT Provider Note   CSN: [id number] Arrival date & time: 12/27/16  1158     History   Chief Complaint Chief Complaint  Patient presents with  . Vaginal Bleeding    started this am  . Emesis    HPI Julia Armstrong is a 26 y.o. female G6 P3 A2 female presents today with chief complaint acute onset painless vaginal bleeding since this morning with associated persistent nausea and vomiting for several weeks. She states that she noticed dark brown/red vaginal discharge and bleeding this morning after intercourse. She denies any pain or itching. She notes mild left lower quadrant cramping which began a few minutes ago.  She also endorses nausea and vomiting multiple times daily for the past several weeks. She states that vomiting is nonbloody but bilious when she has not had anything to eat. She states "I can't keep anything down". She endorses 15 pound weight loss due to her symptoms as well as constipation. She endorses right upper quadrant pain at night which she describes as a mild crampiness that does not radiate. She was seen and evaluated several weeks ago and found to have gallstones without acute cholecystitis. She has not tried anything for her symptoms. She was waiting for her Medicaid to come through and has not established care with an OB/GYN. She thinks she is approximately [redacted] weeks along. She did have an ultrasound while in the ED on April 30 which showed an intrauterine pregnancy.  She denies fever, chills, dysuria, hematuria, melena, shortness of breath, headaches, back pain.   The history is provided by the patient.    History reviewed. No pertinent past medical history.  There are no active problems to display for this patient.   Past Surgical History:  Procedure Laterality Date  . CESAREAN SECTION      OB History    Gravida Para Term Preterm AB Living   1             SAB TAB Ectopic Multiple Live Births                   Home Medications      Prior to Admission medications   Medication Sig Start Date End Date Taking? Authorizing Provider  Doxylamine-Pyridoxine 10-10 MG TBEC Take 1 tablet by mouth every 8 (eight) hours as needed (nausea or vomiting). 12/27/16   Shakirah Kirkey A, PA-C  famotidine (PEPCID) 20 MG tablet Take 1 tablet (20 mg total) by mouth 2 (two) times daily. Patient not taking: Reported on 12/27/2016 11/28/16   Barrett HenleNadeau, Nicole Elizabeth, PA-C  metroNIDAZOLE (FLAGYL) 500 MG tablet Take 1 tablet (500 mg total) by mouth 2 (two) times daily. 12/27/16   Calise Dunckel A, PA-C  Prenatal Vit-Fe Fumarate-FA (PRENATAL COMPLETE) 14-0.4 MG TABS Take 1 tablet by mouth daily. Patient not taking: Reported on 11/28/2016 11/24/16   Trixie DredgeWest, Emily, PA-C  promethazine (PHENERGAN) 25 MG tablet Take 1 tablet (25 mg total) by mouth every 6 (six) hours as needed for nausea or vomiting. 12/27/16   Jeanie SewerFawze, Bev Drennen A, PA-C    Family History Family History  Problem Relation Age of Onset  . Cancer Other     Social History Social History  Substance Use Topics  . Smoking status: Never Smoker  . Smokeless tobacco: Never Used  . Alcohol use No     Allergies   Patient has no known allergies.   Review of Systems Review of Systems  Constitutional: Negative for chills and fever.  Respiratory:  Negative for shortness of breath.   Cardiovascular: Negative for chest pain.  Gastrointestinal: Positive for abdominal pain, constipation, nausea and vomiting. Negative for blood in stool and diarrhea.  Genitourinary: Positive for vaginal bleeding. Negative for dysuria, hematuria and vaginal pain.  Musculoskeletal: Negative for back pain.  All other systems reviewed and are negative.    Physical Exam Updated Vital Signs BP 133/71 (BP Location: Right Arm)   Pulse 74   Temp 99.2 F (37.3 C) (Oral)   Resp 16   Ht 5\' 7"  (1.702 m)   Wt 92.6 kg (204 lb 1.6 oz)   LMP 11/03/2016   SpO2 100%   BMI 31.97 kg/m   Physical Exam  Constitutional: She appears  well-developed and well-nourished. No distress.  HENT:  Head: Normocephalic and atraumatic.  Eyes: Conjunctivae are normal. Right eye exhibits no discharge. Left eye exhibits no discharge.  Neck: No JVD present. No tracheal deviation present.  Cardiovascular: Normal rate, regular rhythm and normal heart sounds.   Pulmonary/Chest: Effort normal and breath sounds normal.  Abdominal: Soft. Bowel sounds are normal. She exhibits no distension. There is no tenderness.  Murphy's absent, no tenderness to palpation at McBurney's point, Rovsing's absent  Genitourinary:  Genitourinary Comments: Examination performed in the presence of a chaperone. There is moderate brown discharge in the vaginal vault. Cervical os is difficult to visualize. No cervical motion tenderness or adnexal tenderness. Uterus is soft and symmetrical.  Musculoskeletal: Normal range of motion. She exhibits no edema.  Neurological: She is alert.  Skin: Skin is warm and dry. She is not diaphoretic.  Psychiatric: She has a normal mood and affect. Her behavior is normal.     ED Treatments / Results  Labs (all labs ordered are listed, but only abnormal results are displayed) Labs Reviewed  WET PREP, GENITAL - Abnormal; Notable for the following:       Result Value   Clue Cells Wet Prep HPF POC PRESENT (*)    WBC, Wet Prep HPF POC FEW (*)    All other components within normal limits  CBC WITH DIFFERENTIAL/PLATELET - Abnormal; Notable for the following:    Hemoglobin 11.5 (*)    HCT 35.4 (*)    MCV 73.0 (*)    MCH 23.7 (*)    All other components within normal limits  COMPREHENSIVE METABOLIC PANEL - Abnormal; Notable for the following:    Sodium 134 (*)    Potassium 3.4 (*)    CO2 21 (*)    BUN 5 (*)    Calcium 8.8 (*)    Albumin 2.9 (*)    ALT 13 (*)    Total Bilirubin 0.1 (*)    All other components within normal limits  URINALYSIS, ROUTINE W REFLEX MICROSCOPIC - Abnormal; Notable for the following:    Hgb urine  dipstick MODERATE (*)    Bacteria, UA RARE (*)    Squamous Epithelial / LPF 0-5 (*)    All other components within normal limits  HCG, QUANTITATIVE, PREGNANCY - Abnormal; Notable for the following:    hCG, Beta Chain, Quant, [place] 161,096 (*)    All other components within normal limits  I-STAT BETA HCG BLOOD, ED ([facility], [facility], [facility] ONLY) - Abnormal; Notable for the following:    I-stat hCG, quantitative >2,000.0 (*)    All other components within normal limits  LIPASE, BLOOD  RPR  HIV ANTIBODY (ROUTINE TESTING)  ABO/RH  GC/CHLAMYDIA PROBE AMP ([place]) NOT AT [facility]    EKG  EKG Interpretation None       Radiology No results found.  Procedures Procedures (including critical care time)  Medications Ordered in ED Medications  ondansetron (ZOFRAN) injection 4 mg (4 mg Intravenous Given 12/27/16 1328)  sodium chloride 0.9 % bolus 1,000 mL (0 mLs Intravenous Stopped 12/27/16 1615)  promethazine (PHENERGAN) tablet 12.5 mg (12.5 mg Oral Given 12/27/16 1725)     Initial Impression / Assessment and Plan / ED Course  I have reviewed the triage vital signs and the nursing notes.  Pertinent labs & imaging results that were available during my care of the patient were reviewed by me and considered in my medical decision making (see chart for details).     Patient with painless vaginal bleeding which began earlier today as well as persistent nausea and vomiting for several weeks. She is approximately [redacted] weeks pregnant. Afebrile, vital signs are stable. Prior imaging established intrauterine pregnancy. UA not concerning for asymptomatic bacteriuria or UTI. Beta hCG quantitative is consistent with gestational age. Brown vaginal discharge noted on pelvic examination. Wet prep suggestive of BV, will treat with Flagyl. She is Rh+, does not require Rhogam. Low suspicion of ectopic pregnancy, PID, TOA, appendicitis. Bleeding may be a result of intercourse this morning versus threatened abortion. Also has  signs and symptoms of hyperemesis Patient will follow up with OB/GYN this week to establish prenatal care and monitor beta hCG quantitative levels. Also discussed proper diet to minimize hyperemesis. Discussed indications for return to the ED. Pt verbalized understanding of and agreement with plan and is safe for discharge home at this time.   Final Clinical Impressions(s) / ED Diagnoses   Final diagnoses:  Vaginal bleeding in pregnancy  Hyperemesis gravidarum    New Prescriptions New Prescriptions   DOXYLAMINE-PYRIDOXINE 10-10 MG TBEC    Take 1 tablet by mouth every 8 (eight) hours as needed (nausea or vomiting).   METRONIDAZOLE (FLAGYL) 500 MG TABLET    Take 1 tablet (500 mg total) by mouth 2 (two) times daily.   PROMETHAZINE (PHENERGAN) 25 MG TABLET    Take 1 tablet (25 mg total) by mouth every 6 (six) hours as needed for nausea or vomiting.     Jeanie Sewer, PA-C 12/27/16 1825    Rolan Bucco, MD 12/28/16 [phone]

## 2016-12-27 NOTE — Discharge Instructions (Signed)
Please take all of your antibiotics until finished!   You may develop abdominal discomfort or diarrhea from the antibiotic.  You may help offset this with probiotics which you can buy or get in yogurt. Do not eat  or take the probiotics until 2 hours after your antibiotic.   You may take either Phenergan or Diclegis for your nausea and vomiting, whichever works best for you. Make sure to eat and drink small frequent meals and small sips of fluids throughout the day. Make sure that your belly is not to fall her to empty throughout the day. Follow up with OB/GYN as soon as you can for establishment of prenatal care. Return to the ED if any concerning symptoms develop.

## 2016-12-27 NOTE — ED Notes (Signed)
PT states understanding of care given, follow up care, and medication prescribed. PT ambulated from ED to car with a steady gait. 

## 2016-12-28 LAB — RPR: RPR Ser Ql: NONREACTIVE

## 2016-12-28 LAB — HIV ANTIBODY (ROUTINE TESTING W REFLEX): HIV Screen 4th Generation wRfx: NONREACTIVE

## 2016-12-29 LAB — GC/CHLAMYDIA PROBE AMP (~~LOC~~) NOT AT ARMC
Chlamydia: NEGATIVE
Neisseria Gonorrhea: NEGATIVE

## 2016-12-29 LAB — ABO/RH

## 2017-01-03 ENCOUNTER — Encounter (HOSPITAL_COMMUNITY): Payer: Self-pay

## 2017-01-03 ENCOUNTER — Emergency Department (HOSPITAL_COMMUNITY)
Admission: EM | Admit: 2017-01-03 | Discharge: 2017-01-03 | Disposition: A | Discharge status: Home / Self Care | Payer: Medicaid Other | Attending: Emergency Medicine | Admitting: Emergency Medicine

## 2017-01-03 ENCOUNTER — Emergency Department (HOSPITAL_COMMUNITY): Payer: Medicaid Other

## 2017-01-03 DIAGNOSIS — R109 Unspecified abdominal pain: Secondary | ICD-10-CM

## 2017-01-03 DIAGNOSIS — Z79899 Other long term (current) drug therapy: Secondary | ICD-10-CM | POA: Diagnosis not present

## 2017-01-03 DIAGNOSIS — O26891 Other specified pregnancy related conditions, first trimester: Secondary | ICD-10-CM | POA: Insufficient documentation

## 2017-01-03 DIAGNOSIS — Z3A11 11 weeks gestation of pregnancy: Secondary | ICD-10-CM | POA: Diagnosis not present

## 2017-01-03 HISTORY — DX: Calculus of gallbladder without cholecystitis without obstruction: K80.20

## 2017-01-03 LAB — COMPREHENSIVE METABOLIC PANEL
ALBUMIN: 3.2 g/dL — AB (ref 3.5–5.0)
ALK PHOS: 48 U/L (ref 38–126)
ALT: 14 U/L (ref 14–54)
ANION GAP: 9 (ref 5–15)
AST: 19 U/L (ref 15–41)
BILIRUBIN TOTAL: 0.2 mg/dL — AB (ref 0.3–1.2)
BUN: 6 mg/dL (ref 6–20)
CALCIUM: 9.4 mg/dL (ref 8.9–10.3)
CO2: 25 mmol/L (ref 22–32)
CREATININE: 0.67 mg/dL (ref 0.44–1.00)
Chloride: 102 mmol/L (ref 101–111)
GFR calc non Af Amer: >60 mL/min (ref >60)
GLUCOSE: 116 mg/dL — AB (ref 65–99)
Potassium: 3.4 mmol/L — ABNORMAL LOW (ref 3.5–5.1)
SODIUM: 136 mmol/L (ref 135–145)
TOTAL PROTEIN: 7.5 g/dL (ref 6.5–8.1)

## 2017-01-03 LAB — URINALYSIS, ROUTINE W REFLEX MICROSCOPIC
Bacteria, UA: NONE SEEN
Bilirubin Urine: NEGATIVE
GLUCOSE, UA: NEGATIVE mg/dL
HGB URINE DIPSTICK: NEGATIVE
Ketones, ur: 20 mg/dL — AB
Leukocytes, UA: NEGATIVE
NITRITE: NEGATIVE
Protein, ur: 30 mg/dL — AB
SPECIFIC GRAVITY, URINE: 1.032 — AB (ref 1.005–1.030)
pH: 5 (ref 5.0–8.0)

## 2017-01-03 LAB — CBC
HCT: 34.5 % — ABNORMAL LOW (ref 36.0–46.0)
HEMOGLOBIN: 11.4 g/dL — AB (ref 12.0–15.0)
MCH: 24 pg — AB (ref 26.0–34.0)
MCHC: 33 g/dL (ref 30.0–36.0)
MCV: 72.6 fL — ABNORMAL LOW (ref 78.0–100.0)
Platelets: 292 10*3/uL (ref 150–400)
RBC: 4.75 MIL/uL (ref 3.87–5.11)
RDW: 15.1 % (ref 11.5–15.5)
WBC: 8 10*3/uL (ref 4.0–10.5)

## 2017-01-03 LAB — I-STAT BETA HCG BLOOD, ED (MC, WL, AP ONLY): I-stat hCG, quantitative: >2000 m[IU]/mL — ABNORMAL HIGH (ref <5)

## 2017-01-03 LAB — LIPASE, BLOOD: Lipase: 23 U/L (ref 11–51)

## 2017-01-03 MED ORDER — SODIUM CHLORIDE 0.9 % IV BOLUS (SEPSIS)
1000.0000 mL | Freq: Once | INTRAVENOUS | Status: AC
  Administered 2017-01-03: 1000 mL via INTRAVENOUS

## 2017-01-03 MED ORDER — MORPHINE SULFATE (PF) 2 MG/ML IV SOLN
4.0000 mg | Freq: Once | INTRAVENOUS | Status: AC
  Administered 2017-01-03: 4 mg via INTRAVENOUS
  Filled 2017-01-03: qty 2

## 2017-01-03 MED ORDER — ONDANSETRON HCL 4 MG/2ML IJ SOLN
4.0000 mg | Freq: Once | INTRAMUSCULAR | Status: AC
  Administered 2017-01-03: 4 mg via INTRAVENOUS
  Filled 2017-01-03: qty 2

## 2017-01-03 NOTE — ED Provider Notes (Signed)
[facility]-EMERGENCY DEPT Provider Note   CSN: [id number] Arrival date & time: 01/03/17  0124     History   Chief Complaint Chief Complaint  Patient presents with  . Abdominal Pain    HPI Julia Armstrong is a 26 y.o. female.  HPI   Patient is a 26 year old female with history of gallstones who presents the ED with complaint of abdominal pain, onset yesterday morning. Patient reports being [redacted] weeks pregnant. Patient reports when she woke up yesterday morning she began having constant sharp pain to her left lower quadrant. Patient reports pain is worse with movement or palpation and is mildly alleviated if laying still. Denies radiation. Denies history of similar abdominal pain. Endorses associated nausea and one episode of NBNB vomiting but notes she has remained nauseous all throughout her pregnancy. Denies fever, chills, chest pain, shortness of breath, cough, hematemesis, diarrhea, urinary symptoms, vaginal bleeding, vaginal discharge, flank pain. Denies taking any medications at home for her symptoms. Patient states she has not been seen by OB due to just recently receiving her Medicaid. Endorses abdominal surgical history of 3 C-sections.  Past Medical History:  Diagnosis Date  . Gallstones     There are no active problems to display for this patient.   Past Surgical History:  Procedure Laterality Date  . CESAREAN SECTION      OB History    Gravida Para Term Preterm AB Living   1             SAB TAB Ectopic Multiple Live Births                   Home Medications    Prior to Admission medications   Medication Sig Start Date End Date Taking? Authorizing Provider  Doxylamine-Pyridoxine 10-10 MG TBEC Take 1 tablet by mouth every 8 (eight) hours as needed (nausea or vomiting). Patient not taking: Reported on 01/03/2017 12/27/16   Michela Pitcher A, PA-C  famotidine (PEPCID) 20 MG tablet Take 1 tablet (20 mg total) by mouth 2 (two) times daily. Patient not taking: Reported on  12/27/2016 11/28/16   Barrett Henle, PA-C  metroNIDAZOLE (FLAGYL) 500 MG tablet Take 1 tablet (500 mg total) by mouth 2 (two) times daily. Patient not taking: Reported on 01/03/2017 12/27/16   Michela Pitcher A, PA-C  Prenatal Vit-Fe Fumarate-FA (PRENATAL COMPLETE) 14-0.4 MG TABS Take 1 tablet by mouth daily. Patient not taking: Reported on 11/28/2016 11/24/16   Trixie Dredge, PA-C  promethazine (PHENERGAN) 25 MG tablet Take 1 tablet (25 mg total) by mouth every 6 (six) hours as needed for nausea or vomiting. Patient not taking: Reported on 01/03/2017 12/27/16   Jeanie Sewer, PA-C    Family History Family History  Problem Relation Age of Onset  . Cancer Other     Social History Social History  Substance Use Topics  . Smoking status: Never Smoker  . Smokeless tobacco: Never Used  . Alcohol use No     Allergies   Patient has no known allergies.   Review of Systems Review of Systems  Gastrointestinal: Positive for abdominal pain, nausea and vomiting.  All other systems reviewed and are negative.    Physical Exam Updated Vital Signs BP (!) 104/54 (BP Location: Left Arm)   Pulse 79   Temp 98.3 F (36.8 C) (Oral)   Resp 18   LMP 11/03/2016   SpO2 100%   Physical Exam  Constitutional: She is oriented to person, place, and time. She appears well-developed and  well-nourished. No distress.  HENT:  Head: Normocephalic and atraumatic.  Mouth/Throat: Oropharynx is clear and moist. No oropharyngeal exudate.  Eyes: Conjunctivae and EOM are normal. Right eye exhibits no discharge. Left eye exhibits no discharge. No scleral icterus.  Neck: Normal range of motion. Neck supple.  Cardiovascular: Normal rate, regular rhythm, normal heart sounds and intact distal pulses.   Pulmonary/Chest: Effort normal and breath sounds normal. No respiratory distress. She has no wheezes. She has no rales. She exhibits no tenderness.  Abdominal: Soft. Bowel sounds are normal. She exhibits no distension and no  mass. There is tenderness in the suprapubic area and left lower quadrant. There is no rigidity, no rebound, no guarding, no CVA tenderness, no tenderness at McBurney's point and negative Murphy's sign. No hernia.  Musculoskeletal: Normal range of motion. She exhibits no edema.  Neurological: She is alert and oriented to person, place, and time.  Skin: Skin is warm and dry. She is not diaphoretic.  Nursing note and vitals reviewed.    ED Treatments / Results  Labs (all labs ordered are listed, but only abnormal results are displayed) Labs Reviewed  COMPREHENSIVE METABOLIC PANEL - Abnormal; Notable for the following:       Result Value   Potassium 3.4 (*)    Glucose, Bld 116 (*)    Albumin 3.2 (*)    Total Bilirubin 0.2 (*)    All other components within normal limits  CBC - Abnormal; Notable for the following:    Hemoglobin 11.4 (*)    HCT 34.5 (*)    MCV 72.6 (*)    MCH 24.0 (*)    All other components within normal limits  URINALYSIS, ROUTINE W REFLEX MICROSCOPIC - Abnormal; Notable for the following:    Specific Gravity, Urine 1.032 (*)    Ketones, ur 20 (*)    Protein, ur 30 (*)    Squamous Epithelial / LPF 0-5 (*)    All other components within normal limits  I-STAT BETA HCG BLOOD, ED ([facility], [facility], [facility] ONLY) - Abnormal; Notable for the following:    I-stat hCG, quantitative >2,000.0 (*)    All other components within normal limits  LIPASE, BLOOD    EKG  EKG Interpretation None       Radiology [place] Ob Comp Less 14 Wks  Result Date: 01/03/2017 CLINICAL DATA:  Pregnant patient with left lower quadrant abdominal pain. EXAM: OBSTETRIC <14 WK ULTRASOUND TECHNIQUE: Transabdominal ultrasound was performed for evaluation of the gestation as well as the maternal uterus and adnexal regions. COMPARISON:  Obstetric ultrasound 11/24/2016 FINDINGS: Intrauterine gestational sac: Single Yolk sac:  Not Visualized. Embryo:  Visualized. Cardiac Activity: Visualized. Heart Rate: 155 bpm CRL:    51  mm   11 w 5 d                  US EDC: 07/20/2017 Subchorionic hemorrhage:  Moderate measuring 3.3 x 2.7 x 4.1 cm. Maternal uterus/adnexae: Mild soft tissue thickening and posterior lower uterine segment of unknown significance. Both ovaries are well-visualized and are normal. No pelvic free fluid. IMPRESSION: 1. Single live intrauterine pregnancy estimated gestational age [redacted] weeks 5 days for estimated date of delivery 07/20/2017. 2. Moderate subchorionic hemorrhage inferior to the gestational sac. 3. Normal sonographic appearance of the left ovary. Electronically Signed   By: Rubye OaksMelanie  Ehinger M.D.   On: 01/03/2017 04:26    Procedures Procedures (including critical care time)  Medications Ordered in ED Medications  sodium chloride 0.9 % bolus  1,000 mL (1,000 mLs Intravenous New Bag/Given 01/03/17 0412)  morphine 2 MG/ML injection 4 mg (4 mg Intravenous Given 01/03/17 0414)  ondansetron (ZOFRAN) injection 4 mg (4 mg Intravenous Given 01/03/17 0413)     Initial Impression / Assessment and Plan / ED Course  I have reviewed the triage vital signs and the nursing notes.  Pertinent labs & imaging results that were available during my care of the patient were reviewed by me and considered in my medical decision making (see chart for details).     Patient presents with abdominal pain with associated nausea and vomiting. She reports being pregnant and states she has not been seen by OB due to recently receiving her Medicaid. Denies fever or vaginal bleeding. VSS. Exam revealed tenderness over suprapubic region and left lower quadrant, no peritoneal signs. Remaining exam unremarkable. Patient given pain meds, antiemetics and IV fluids. Labs appear consistent with baseline. OB US showed single live intrauterine pregnancy estimated gestational age [redacted] weeks 5 days, moderate subchorionic hemorrhage inferior to gestational sac. On reevaluation, pt is sleeping resting comfortably in bed. Pt reports significant  improvement of sxs. Tolerating PO. Discussed results and plan for d/c with pt. Plan to d/c pt home with symptomatic tx and close f/u with OB outpatient. Pt given info to scheduled an appointment with [facility]. Discussed return precautions.  Final Clinical Impressions(s) / ED Diagnoses   Final diagnoses:  Abdominal pain during pregnancy in first trimester    New Prescriptions New Prescriptions   No medications on file     Barrett Henle, Cordelia Poche 01/03/17 0546    Dione Booze, MD 01/03/17 [phone]

## 2017-01-03 NOTE — ED Triage Notes (Signed)
Pt reports 8/10 left side abd pain, pt believes pain is r/t to Gallstones. Pt is [redacted] weeks pregnant. Pt denies vaginal bleeding.

## 2017-01-03 NOTE — ED Notes (Addendum)
Ultrasound on going.  Will start IV and give meds  once UTS done, pt. made aware.

## 2017-01-03 NOTE — Discharge Instructions (Signed)
Continue taking your prescriptions as prescribed as needed for nausea and vomiting. Continue drinking fluids at home to remain hydrated. I recommend calling the women's clinic listed below to schedule a follow-up appointment within the next week for reevaluation and further management of your pregnancy. Please return to the Emergency Department if symptoms worsen or new onset of fever, chest pain, difficulty breathing, new/worsening abdominal pain, vomiting blood, unable to keep fluids down, vaginal bleeding.

## 2017-01-26 ENCOUNTER — Inpatient Hospital Stay (HOSPITAL_COMMUNITY): Payer: Medicaid Other

## 2017-01-26 ENCOUNTER — Encounter (HOSPITAL_COMMUNITY): Payer: Self-pay | Admitting: Emergency Medicine

## 2017-01-26 ENCOUNTER — Inpatient Hospital Stay (HOSPITAL_COMMUNITY)
Admission: AD | Admit: 2017-01-26 | Discharge: 2017-01-26 | Disposition: A | Discharge status: Home / Self Care | Payer: Medicaid Other | Source: Ambulatory Visit | Attending: Obstetrics & Gynecology | Admitting: Obstetrics & Gynecology

## 2017-01-26 DIAGNOSIS — Z332 Encounter for elective termination of pregnancy: Secondary | ICD-10-CM

## 2017-01-26 DIAGNOSIS — Z3A15 15 weeks gestation of pregnancy: Secondary | ICD-10-CM | POA: Insufficient documentation

## 2017-01-26 DIAGNOSIS — O039 Complete or unspecified spontaneous abortion without complication: Secondary | ICD-10-CM | POA: Diagnosis present

## 2017-01-26 DIAGNOSIS — Z809 Family history of malignant neoplasm, unspecified: Secondary | ICD-10-CM | POA: Insufficient documentation

## 2017-01-26 LAB — BASIC METABOLIC PANEL
ANION GAP: 11 (ref 5–15)
BUN: 6 mg/dL (ref 6–20)
CALCIUM: 8.9 mg/dL (ref 8.9–10.3)
CHLORIDE: 104 mmol/L (ref 101–111)
CO2: 21 mmol/L — AB (ref 22–32)
CREATININE: 0.64 mg/dL (ref 0.44–1.00)
GFR calc non Af Amer: >60 mL/min (ref >60)
Glucose, Bld: 89 mg/dL (ref 65–99)
Potassium: 3.4 mmol/L — ABNORMAL LOW (ref 3.5–5.1)
SODIUM: 136 mmol/L (ref 135–145)

## 2017-01-26 LAB — CBC
HCT: 35 % — ABNORMAL LOW (ref 36.0–46.0)
HEMOGLOBIN: 11.8 g/dL — AB (ref 12.0–15.0)
MCH: 23.9 pg — AB (ref 26.0–34.0)
MCHC: 33.7 g/dL (ref 30.0–36.0)
MCV: 71 fL — ABNORMAL LOW (ref 78.0–100.0)
PLATELETS: 279 10*3/uL (ref 150–400)
RBC: 4.93 MIL/uL (ref 3.87–5.11)
RDW: 15 % (ref 11.5–15.5)
WBC: 7.6 10*3/uL (ref 4.0–10.5)

## 2017-01-26 MED ORDER — OXYCODONE-ACETAMINOPHEN 5-325 MG PO TABS: 1.0000 | ORAL_TABLET | Freq: Four times a day (QID) | ORAL | 0 refills | Status: DC | PRN

## 2017-01-26 MED ORDER — IBUPROFEN 600 MG PO TABS
600.0000 mg | ORAL_TABLET | Freq: Once | ORAL | Status: AC
  Administered 2017-01-26: 600 mg via ORAL
  Filled 2017-01-26: qty 1

## 2017-01-26 MED ORDER — ONDANSETRON HCL 4 MG/2ML IJ SOLN
4.0000 mg | Freq: Once | INTRAMUSCULAR | Status: AC
  Administered 2017-01-26: 4 mg via INTRAVENOUS

## 2017-01-26 MED ORDER — IBUPROFEN 600 MG PO TABS: 600.0000 mg | ORAL_TABLET | Freq: Four times a day (QID) | ORAL | 0 refills | Status: DC | PRN

## 2017-01-26 MED ORDER — HYDROMORPHONE HCL 1 MG/ML IJ SOLN
1.0000 mg | Freq: Once | INTRAMUSCULAR | Status: AC
  Administered 2017-01-26: 1 mg via INTRAVENOUS
  Filled 2017-01-26: qty 1

## 2017-01-26 MED ORDER — LACTATED RINGERS IV SOLN
INTRAVENOUS | Status: DC
  Administered 2017-01-26: 03:00:00 via INTRAVENOUS

## 2017-01-26 MED ORDER — MISOPROSTOL 200 MCG PO TABS
200.0000 ug | ORAL_TABLET | Freq: Once | ORAL | Status: AC
  Administered 2017-01-26: 200 ug via ORAL
  Filled 2017-01-26: qty 1

## 2017-01-26 MED ORDER — MORPHINE SULFATE (PF) 2 MG/ML IV SOLN
INTRAVENOUS | Status: AC
  Administered 2017-01-26: 4 mg via INTRAVENOUS
  Filled 2017-01-26: qty 2

## 2017-01-26 MED ORDER — SODIUM CHLORIDE 0.9 % IV BOLUS (SEPSIS)
1000.0000 mL | Freq: Once | INTRAVENOUS | Status: AC
  Administered 2017-01-26: 1000 mL via INTRAVENOUS

## 2017-01-26 NOTE — ED Provider Notes (Signed)
TIME SEEN: 12:31 AM  CHIEF COMPLAINT: "I think I'm having my baby"  HPI: Patient is a 26 year old G6 P3 A2 is approximately 16 weeks and 2 days based on estimated due date of December 15 who presents emergency department with severe lower abdominal cramping that started tonight. States it feels like contractions and they are approximately 1 minute apart. States that she thinks that her water broke and the car and now she is having vaginal bleeding as well. She has not had any prenatal care. She does not currently have a OB/GYN. She states the only reason she knows her due date is because she had an ultrasound in an emergency department. She is unsure of her last menstrual period.  No recent fevers, vomiting, diarrhea, dysuria, hematuria, vaginal bleeding or discharge prior to this.  ROS: See HPI Constitutional: no fever  Eyes: no drainage  ENT: no runny nose   Cardiovascular:  no chest pain  Resp: no SOB  GI: no vomiting GU: no dysuria Integumentary: no rash  Allergy: no hives  Musculoskeletal: no leg swelling  Neurological: no slurred speech ROS otherwise negative  PAST MEDICAL HISTORY/PAST SURGICAL HISTORY:  Past Medical History:  Diagnosis Date  . Gallstones     MEDICATIONS:  Prior to Admission medications   Medication Sig Start Date End Date Taking? Authorizing Provider  Doxylamine-Pyridoxine 10-10 MG TBEC Take 1 tablet by mouth every 8 (eight) hours as needed (nausea or vomiting). Patient not taking: Reported on 01/03/2017 12/27/16   Michela Pitcher A, PA-C  famotidine (PEPCID) 20 MG tablet Take 1 tablet (20 mg total) by mouth 2 (two) times daily. Patient not taking: Reported on 12/27/2016 11/28/16   Barrett Henle, PA-C  metroNIDAZOLE (FLAGYL) 500 MG tablet Take 1 tablet (500 mg total) by mouth 2 (two) times daily. Patient not taking: Reported on 01/03/2017 12/27/16   Michela Pitcher A, PA-C  Prenatal Vit-Fe Fumarate-FA (PRENATAL COMPLETE) 14-0.4 MG TABS Take 1 tablet by mouth  daily. Patient not taking: Reported on 11/28/2016 11/24/16   Trixie Dredge, PA-C  promethazine (PHENERGAN) 25 MG tablet Take 1 tablet (25 mg total) by mouth every 6 (six) hours as needed for nausea or vomiting. Patient not taking: Reported on 01/03/2017 12/27/16   Michela Pitcher A, PA-C    ALLERGIES:  No Known Allergies  SOCIAL HISTORY:  Social History  Substance Use Topics  . Smoking status: Never Smoker  . Smokeless tobacco: Never Used  . Alcohol use No    FAMILY HISTORY: Family History  Problem Relation Age of Onset  . Cancer Other     EXAM: BP 113/81   Pulse 75   Temp 98.7 F (37.1 C) (Oral)   Resp (!) 22   Ht 5\' 7"  (1.702 m)   LMP 11/03/2016   SpO2 100%  CONSTITUTIONAL: Alert and oriented and responds appropriately to questions. Appears uncomfortable, obese, afebrile HEAD: Normocephalic EYES: Conjunctivae clear, pupils appear equal, EOMI ENT: normal nose; moist mucous membranes NECK: Supple, no meningismus, no nuchal rigidity, no LAD  CARD: RRR; S1 and S2 appreciated; no murmurs, no clicks, no rubs, no gallops RESP: Normal chest excursion without splinting or tachypnea; breath sounds clear and equal bilaterally; no wheezes, no rhonchi, no rales, no hypoxia or respiratory distress, speaking full sentences ABD/GI: Normal bowel sounds; non-distended; soft, tender throughout the lower abdomen, no rebound, no guarding, no peritoneal signs, no hepatosplenomegaly GU:  Patient has some vaginal bleeding but no active hemorrhage, external genitalia appear normal, patient does have a fetus  at the vaginal introitus BACK:  The back appears normal and is non-tender to palpation, there is no CVA tenderness EXT: Normal ROM in all joints; non-tender to palpation; no edema; normal capillary refill; no cyanosis, no calf tenderness or swelling    SKIN: Normal color for age and race; warm; no rash NEURO: Moves all extremities equally PSYCH: The patient's mood and manner are appropriate. Grooming and  personal hygiene are appropriate.  MEDICAL DECISION MAKING: Patient here with miscarriage. She felt her water break prior to arrival and is now bleeding. No hemorrhage. Hemodynamically stable. She has a fetus at the vaginal introitus. At this time does not want to fully deliver the fetus. I have performed a bedside ultrasound which does not show any intrauterine pregnancy. We'll give IV fluids, pain medication, nausea medication. Will obtain labs. Discussed with Dr. Erin FullingHarraway Smith with [facility] who agrees to accept the patient in transfer.  ED PROGRESS: Patient stable. Labs unremarkable. Pain well controlled after 1 dose of morphine.    2:10 AM  Dierdre ForthHannah Muthersbaugh, PA was called to the bedside to perform delivery as I was in another emergent procedure. She delivered patient's fetus and placenta. Placenta appeared intact. I have updated Dr. Erin FullingHarraway Smith. [facility] at bedside for transfer. Patient is still having some vaginal bleeding but no hemorrhage. Still hemodynamically stable.    CRITICAL CARE Performed by: Raelyn NumberWARD, Caliyah Sieh N   Total critical care time: 45 minutes  Critical care time was exclusive of separately billable procedures and treating other patients.  Critical care was necessary to treat or prevent imminent or life-threatening deterioration.  Critical care was time spent personally by me on the following activities: development of treatment plan with patient and/or surrogate as well as nursing, discussions with consultants, evaluation of patient's response to treatment, examination of patient, obtaining history from patient or surrogate, ordering and performing treatments and interventions, ordering and review of laboratory studies, ordering and review of radiographic studies, pulse oximetry and re-evaluation of patient's condition.     Kavion Mancinas, Layla MawKristen N, DO 01/26/17 0221

## 2017-01-26 NOTE — ED Provider Notes (Signed)
  Julia Armstrong is a 26 y.o. female presents [id number] with known pregnancy and EDD of 07/11/17 stating she feels like she is having contractions 1 minute apart.  Pt initially evaluated by Dr. Elesa MassedWard and fetus was noted at the vaginal introitus.  Pt did not want to push.  Please see her full H&P.  02:00AM I was called to the Pt's room for an emergent and immanent delivery.  Pt supine in bed reports that she can feel the baby.  On exam, fetus has been partially delivered.  Pt reports feeling lots of pressure.  Intact fetus was spontaneously delivered approx 5 minutes later.  Pt without pulse or respiratory effort.  Small amount of vaginal bleeding.  Approx 10 minutes later, Pt spontaneously delivered the placenta.  It appears intact and was placed in a specimen container.  After delivery of the placenta, cord was clamped and cut for patient to hold the fetus.  Minimal vaginal bleeding after delivery of the placenta.  Pt reports mild abd cramping, but no significant abd pain.  Uterus is not palpable on my exam after delivery of placenta.      Review the Delivery Report for details.    CRITICAL CARE Performed by: Dierdre ForthMuthersbaugh, Rasool Rommel Total critical care time: 45 minutes Critical care time was exclusive of separately billable procedures and treating other patients. Critical care was necessary to treat or prevent imminent or life-threatening deterioration. Critical care was time spent personally by me on the following activities: development of treatment plan with patient and/or surrogate as well as nursing, discussions with consultants, evaluation of patient's response to treatment, examination of patient, obtaining history from patient or surrogate, ordering and performing treatments and interventions, ordering and review of laboratory studies, ordering and review of radiographic studies, pulse oximetry and re-evaluation of patient's condition.      Dierdre ForthMuthersbaugh, Koleton Duchemin, [place] JerseyPA-C 01/26/17 [phone]

## 2017-01-26 NOTE — MAU Provider Note (Signed)
History     CSN: [id number]  Arrival date and time: 01/26/17 0028  First Provider Initiated Contact with Patient 01/26/17 0249      Chief Complaint  Patient presents with  . Miscarriage   HPI  Julia Armstrong is a 26 y.o. [id number] at 8538w0d with no prenatal care who [facility]. Patient presented to the ED tonight for abdominal pain; once she arrived to the hospital her water broke. During exam fetus was noted at introitus & arrangements were made for patient to be transferred to MAU. Prior to transfer, fetus & placenta delivered.   OB History    Gravida Para Term Preterm AB Living   5 3 3   1 3    SAB TAB Ectopic Multiple Live Births   1       3      Past Medical History:  Diagnosis Date  . Gallstones     Past Surgical History:  Procedure Laterality Date  . CESAREAN SECTION      Family History  Problem Relation Age of Onset  . Cancer Other     Social History  Substance Use Topics  . Smoking status: Never Smoker  . Smokeless tobacco: Never Used  . Alcohol use No    Allergies: No Known Allergies  Prescriptions Prior to Admission  Medication Sig Dispense Refill Last Dose  . Doxylamine-Pyridoxine 10-10 MG TBEC Take 1 tablet by mouth every 8 (eight) hours as needed (nausea or vomiting). (Patient not taking: Reported on 01/03/2017) 60 tablet 0 Not Taking at Unknown time  . famotidine (PEPCID) 20 MG tablet Take 1 tablet (20 mg total) by mouth 2 (two) times daily. (Patient not taking: Reported on 12/27/2016) 30 tablet 0 Not Taking at Unknown time  . metroNIDAZOLE (FLAGYL) 500 MG tablet Take 1 tablet (500 mg total) by mouth 2 (two) times daily. (Patient not taking: Reported on 01/03/2017) 13 tablet 0 Not Taking at Unknown time  . Prenatal Vit-Fe Fumarate-FA (PRENATAL COMPLETE) 14-0.4 MG TABS Take 1 tablet by mouth daily. (Patient not taking: Reported on 11/28/2016) 60 each 5 Not Taking at Unknown time  . promethazine (PHENERGAN) 25 MG tablet Take 1  tablet (25 mg total) by mouth every 6 (six) hours as needed for nausea or vomiting. (Patient not taking: Reported on 01/03/2017) 30 tablet 0 Not Taking at Unknown time    Review of Systems  Constitutional: Negative.   Gastrointestinal: Positive for abdominal pain. Negative for constipation, diarrhea, nausea and vomiting.  Genitourinary: Positive for vaginal bleeding.   Physical Exam   Blood pressure 119/66, pulse 60, temperature 97.9 F (36.6 C), temperature source Oral, resp. rate 16, height 5\' 7"  (1.702 m), last menstrual period 11/03/2016, SpO2 100 %.  Physical Exam  Nursing note and vitals reviewed. Constitutional: She is oriented to person, place, and time. She appears well-developed and well-nourished. No distress.  HENT:  Head: Normocephalic and atraumatic.  Eyes: Conjunctivae are normal. Right eye exhibits no discharge. Left eye exhibits no discharge. No scleral icterus.  Neck: Normal range of motion.  Cardiovascular: Normal rate, regular rhythm and normal heart sounds.   No murmur heard. Respiratory: Effort normal and breath sounds normal. No respiratory distress. She has no wheezes.  GI: Soft.  Fundus firm, 2BU  Genitourinary: There is bleeding in the vagina.  Neurological: She is alert and oriented to person, place, and time.  Skin: Skin is warm and dry. She is not diaphoretic.  Psychiatric: She has a normal mood  and affect. Her behavior is normal. Judgment and thought content normal.    MAU Course  Procedures Results for orders placed or performed during the hospital encounter of 01/26/17 (from the past 24 hour(s))  Basic metabolic panel     Status: Abnormal   Collection Time: 01/26/17  1:04 AM  Result Value Ref Range   Sodium 136 135 - 145 mmol/L   Potassium 3.4 (L) 3.5 - 5.1 mmol/L   Chloride 104 101 - 111 mmol/L   CO2 21 (L) 22 - 32 mmol/L   Glucose, Bld 89 65 - 99 mg/dL   BUN 6 6 - 20 mg/dL   Creatinine, Ser 1.61 0.44 - 1.00 mg/dL   Calcium 8.9 8.9 - 09.6  mg/dL   GFR calc non Af Amer >60 >60 mL/min   GFR calc Af Amer >60 >60 mL/min   Anion gap 11 5 - 15  CBC     Status: Abnormal   Collection Time: 01/26/17  1:04 AM  Result Value Ref Range   WBC 7.6 4.0 - 10.5 K/uL   RBC 4.93 3.87 - 5.11 MIL/uL   Hemoglobin 11.8 (L) 12.0 - 15.0 g/dL   HCT 04.5 (L) 40.9 - 81.1 %   MCV 71.0 (L) 78.0 - 100.0 fL   MCH 23.9 (L) 26.0 - 34.0 pg   MCHC 33.7 30.0 - 36.0 g/dL   RDW 91.4 78.2 - 95.6 %   Platelets 279 150 - 400 K/uL  Type and screen [facility]     Status: None (Preliminary result)   Collection Time: 01/26/17  1:20 AM  Result Value Ref Range   ABO/RH(D) PENDING    Antibody Screen NEG    Sample Expiration 01/29/2017    US Transvaginal Non-ob  Result Date: 01/26/2017 CLINICAL DATA:  Status post spontaneous abortion, with persistent active bleeding. Assess for retained products of conception. Initial encounter. EXAM: TRANSABDOMINAL AND TRANSVAGINAL ULTRASOUND OF PELVIS TECHNIQUE: Both transabdominal and transvaginal ultrasound examinations of the pelvis were performed. Transabdominal technique was performed for global imaging of the pelvis including uterus, ovaries, adnexal regions, and pelvic cul-de-sac. It was necessary to proceed with endovaginal exam following the transabdominal exam to visualize the endometrium. COMPARISON:  Pelvic ultrasound performed 01/03/2017 FINDINGS: Uterus Measurements: 13.4 x 7.2 x 8.7 cm. No fibroids or other mass visualized. Endometrium Thickness: 2.5 cm. Somewhat heterogeneous, likely reflecting mild underlying clot. No definite evidence for retained products of conception. Right ovary Not visualized. Left ovary Measurements: 3.3 x 2.2 x 3.0 cm. Normal appearance/no adnexal mass. Other findings No abnormal free fluid. IMPRESSION: No definite evidence for retained products of conception. Endometrial echo complex is somewhat heterogeneous, likely reflecting mild underlying clot. Electronically Signed   By:  Roanna Raider M.D.   On: 01/26/2017 03:58   US Pelvis Complete  Result Date: 01/26/2017 CLINICAL DATA:  Status post spontaneous abortion, with persistent active bleeding. Assess for retained products of conception. Initial encounter. EXAM: TRANSABDOMINAL AND TRANSVAGINAL ULTRASOUND OF PELVIS TECHNIQUE: Both transabdominal and transvaginal ultrasound examinations of the pelvis were performed. Transabdominal technique was performed for global imaging of the pelvis including uterus, ovaries, adnexal regions, and pelvic cul-de-sac. It was necessary to proceed with endovaginal exam following the transabdominal exam to visualize the endometrium. COMPARISON:  Pelvic ultrasound performed 01/03/2017 FINDINGS: Uterus Measurements: 13.4 x 7.2 x 8.7 cm. No fibroids or other mass visualized. Endometrium Thickness: 2.5 cm. Somewhat heterogeneous, likely reflecting mild underlying clot. No definite evidence for retained products of conception. Right ovary Not  visualized. Left ovary Measurements: 3.3 x 2.2 x 3.0 cm. Normal appearance/no adnexal mass. Other findings No abnormal free fluid. IMPRESSION: No definite evidence for retained products of conception. Endometrial echo complex is somewhat heterogeneous, likely reflecting mild underlying clot. Electronically Signed   By: Roanna Raider M.D.   On: 01/26/2017 03:58    MDM VSS Patient & SO appropriately upset. Chaplian services offerred but patient declines at this time.  Small amount of bleeding. Fundus firm at 2BU Ultrasound shows clot in uterus, no blood flow. Endometrial thickness 2.5 cm Reviewed with Dr. Jahleel Stroschein Fulling. Will give cytotec & discharge home with f/u in clinic Abo/rh pending. S/w Myriam Jacobson in the lab; confirms that blood type is AB positive but that would not be able to results in the computer for several hours.   Assessment and Plan  A: 1. Miscarriage   2. Second trimester abortion    P: Discharge home Rx ibuprofen & percocet Msg to [facility] for  f/u appt in 2 weeks Discussed reasons to return to MAU Pelvic rest  Judeth Horn 01/26/2017, 2:48 AM

## 2017-01-26 NOTE — MAU Note (Signed)
Pt presented to MAU from [facility] where she delivered a 3670w0d female fetus. Pt was offered spiritual care/chaplin services in which she and her husband declined. Pt requested to take fetus home, pt filled out appropriate paperwork. Pt was given support pamphlets as well as a memory box.   Pts fundal check post U/S was firm and had a small amount of bleeding with no clots.

## 2017-01-26 NOTE — Discharge Instructions (Signed)
Return to care  °· If you have heavier bleeding that soaks through more that 2 pads per hour for an hour or more °· If you bleed so much that you feel like you might pass out or you do pass out °· If you have significant abdominal pain that is not improved with Tylenol  °· If you develop a fever > 100.5 ° ° ° ° °Miscarriage °A miscarriage is the sudden loss of an unborn baby (fetus) before the 20th week of pregnancy. Most miscarriages happen in the first 3 months of pregnancy. Sometimes, it happens before a woman even knows she is pregnant. A miscarriage is also called a "spontaneous miscarriage" or "early pregnancy loss." Having a miscarriage can be an emotional experience. Talk with your caregiver about any questions you may have about miscarrying, the grieving process, and your future pregnancy plans. °What are the causes? °· Problems with the fetal chromosomes that make it impossible for the baby to develop normally. Problems with the baby's genes or chromosomes are most often the result of errors that occur, by chance, as the embryo divides and grows. The problems are not inherited from the parents. °· Infection of the cervix or uterus. °· Hormone problems. °· Problems with the cervix, such as having an incompetent cervix. This is when the tissue in the cervix is not strong enough to hold the pregnancy. °· Problems with the uterus, such as an abnormally shaped uterus, uterine fibroids, or congenital abnormalities. °· Certain medical conditions. °· Smoking, drinking alcohol, or taking illegal drugs. °· Trauma. °Often, the cause of a miscarriage is unknown. °What are the signs or symptoms? °· Vaginal bleeding or spotting, with or without cramps or pain. °· Pain or cramping in the abdomen or lower back. °· Passing fluid, tissue, or blood clots from the vagina. °How is this diagnosed? °Your caregiver will perform a physical exam. You may also have an ultrasound to confirm the miscarriage. Blood or urine tests may  also be ordered. °How is this treated? °· Sometimes, treatment is not necessary if you naturally pass all the fetal tissue that was in the uterus. If some of the fetus or placenta remains in the body (incomplete miscarriage), tissue left behind may become infected and must be removed. Usually, a dilation and curettage (D and C) procedure is performed. During a D and C procedure, the cervix is widened (dilated) and any remaining fetal or placental tissue is gently removed from the uterus. °· Antibiotic medicines are prescribed if there is an infection. Other medicines may be given to reduce the size of the uterus (contract) if there is a lot of bleeding. °· If you have Rh negative blood and your baby was Rh positive, you will need a Rh immunoglobulin shot. This shot will protect any future baby from having Rh blood problems in future pregnancies. °Follow these instructions at home: °· Your caregiver may order bed rest or may allow you to continue light activity. Resume activity as directed by your caregiver. °· Have someone help with home and family responsibilities during this time. °· Keep track of the number of sanitary pads you use each day and how soaked (saturated) they are. Write down this information. °· Do not use tampons. Do not douche or have sexual intercourse until approved by your caregiver. °· Only take over-the-counter or prescription medicines for pain or discomfort as directed by your caregiver. °· Do not take aspirin. Aspirin can cause bleeding. °· Keep all follow-up appointments with your caregiver. °·   If you or your partner have problems with grieving, talk to your caregiver or seek counseling to help cope with the pregnancy loss. Allow enough time to grieve before trying to get pregnant again. °Get help right away if: °· You have severe cramps or pain in your back or abdomen. °· You have a fever. °· You pass large blood clots (walnut-sized or larger) or tissue from your vagina. Save any tissue  for your caregiver to inspect. °· Your bleeding increases. °· You have a thick, bad-smelling vaginal discharge. °· You become lightheaded, weak, or you faint. °· You have chills. °This information is not intended to replace advice given to you by your health care provider. Make sure you discuss any questions you have with your health care provider. °Document Released: 01/07/2001 Document Revised: 12/20/2015 Document Reviewed: 09/02/2011 °Elsevier Interactive Patient Education © 2017 [employer] Inc[employer] ° °

## 2017-01-26 NOTE — ED Triage Notes (Signed)
Pt comes to ed, vag bleeding and pregnancy  Unknown pre-natal care. Provider to evaluate. 18 placed in right AC blood work gathered. 4 mg morphine and 4 mg Zofran ordered. emergency override. Pt alert and stable.

## 2017-01-27 LAB — TYPE AND SCREEN
ABO/RH(D): AB POS
Antibody Screen: NEGATIVE
PT AG TYPE: NEGATIVE

## 2017-01-30 ENCOUNTER — Encounter (HOSPITAL_COMMUNITY): Payer: Self-pay | Admitting: *Deleted

## 2017-01-30 ENCOUNTER — Inpatient Hospital Stay (HOSPITAL_COMMUNITY)
Admission: AD | Admit: 2017-01-30 | Discharge: 2017-01-30 | Disposition: A | Discharge status: Home / Self Care | Payer: Medicaid Other | Source: Ambulatory Visit | Attending: Obstetrics and Gynecology | Admitting: Obstetrics and Gynecology

## 2017-01-30 ENCOUNTER — Inpatient Hospital Stay (HOSPITAL_COMMUNITY): Payer: Medicaid Other

## 2017-01-30 DIAGNOSIS — R109 Unspecified abdominal pain: Secondary | ICD-10-CM

## 2017-01-30 DIAGNOSIS — N939 Abnormal uterine and vaginal bleeding, unspecified: Secondary | ICD-10-CM

## 2017-01-30 DIAGNOSIS — Z79899 Other long term (current) drug therapy: Secondary | ICD-10-CM | POA: Insufficient documentation

## 2017-01-30 DIAGNOSIS — O034 Incomplete spontaneous abortion without complication: Secondary | ICD-10-CM | POA: Diagnosis not present

## 2017-01-30 LAB — CBC
HCT: 33.9 % — ABNORMAL LOW (ref 36.0–46.0)
HEMOGLOBIN: 11 g/dL — AB (ref 12.0–15.0)
MCH: 24.1 pg — ABNORMAL LOW (ref 26.0–34.0)
MCHC: 32.4 g/dL (ref 30.0–36.0)
MCV: 74.3 fL — AB (ref 78.0–100.0)
PLATELETS: 274 10*3/uL (ref 150–400)
RBC: 4.56 MIL/uL (ref 3.87–5.11)
RDW: 15.6 % — ABNORMAL HIGH (ref 11.5–15.5)
WBC: 5.3 10*3/uL (ref 4.0–10.5)

## 2017-01-30 MED ORDER — ONDANSETRON HCL 4 MG/2ML IJ SOLN
4.0000 mg | Freq: Once | INTRAMUSCULAR | Status: AC
  Administered 2017-01-30: 4 mg via INTRAVENOUS
  Filled 2017-01-30: qty 2

## 2017-01-30 MED ORDER — MISOPROSTOL 200 MCG PO TABS
600.0000 ug | ORAL_TABLET | Freq: Once | ORAL | Status: AC
  Administered 2017-01-30: 600 ug via BUCCAL
  Filled 2017-01-30: qty 3

## 2017-01-30 MED ORDER — HYDROMORPHONE HCL 1 MG/ML IJ SOLN
1.0000 mg | Freq: Once | INTRAMUSCULAR | Status: AC
  Administered 2017-01-30: 1 mg via INTRAVENOUS
  Filled 2017-01-30: qty 1

## 2017-01-30 MED ORDER — HYDROMORPHONE HCL 2 MG PO TABS: 2.0000 mg | ORAL_TABLET | Freq: Four times a day (QID) | ORAL | 0 refills | Status: DC | PRN

## 2017-01-30 MED ORDER — PREPLUS 27-1 MG PO TABS: 1.0000 | ORAL_TABLET | Freq: Every day | ORAL | 1 refills | Status: DC

## 2017-01-30 MED ORDER — LACTATED RINGERS IV SOLN
INTRAVENOUS | Status: DC
  Administered 2017-01-30: 11:00:00 via INTRAVENOUS

## 2017-01-30 NOTE — MAU Provider Note (Signed)
History     CSN: [id number]  Arrival date and time: 01/30/17 [id number]   First Provider Initiated Contact with Patient 01/30/17 1018     Chief Complaint  Patient presents with  . Vaginal Bleeding  . Abdominal Pain   HPI Julia Armstrong is a 26 y.o. [id number] who presents with vaginal bleeding and abdominal pain that started two days ago and has gotten worse. She had a 15 week miscarriage on Sunday and had minimal bleeding and pain upon discharge. She states the pain is constant and rates it a 10/10. She has tried tylenol and ibuprofen with no relief. She states the bleeding is heavier than a period and she is saturating 2 pads in less than an hour for the last 4 hours. She states she felt dizzy last night and vomited once this morning.   OB History    Gravida Para Term Preterm AB Living   5 3 3   2 3    SAB TAB Ectopic Multiple Live Births   2       3      Past Medical History:  Diagnosis Date  . Gallstones     Past Surgical History:  Procedure Laterality Date  . CESAREAN SECTION      Family History  Problem Relation Age of Onset  . Cancer Other     Social History  Substance Use Topics  . Smoking status: Never Smoker  . Smokeless tobacco: Never Used  . Alcohol use No    Allergies: No Known Allergies  Prescriptions Prior to Admission  Medication Sig Dispense Refill Last Dose  . acetaminophen (TYLENOL) 500 MG tablet Take 1,000 mg by mouth every 6 (six) hours as needed for mild pain.   01/30/2017 at Unknown time  . ibuprofen (ADVIL,MOTRIN) 600 MG tablet Take 1 tablet (600 mg total) by mouth every 6 (six) hours as needed. 20 tablet 0 01/29/2017 at Unknown time  . famotidine (PEPCID) 20 MG tablet Take 1 tablet (20 mg total) by mouth 2 (two) times daily. (Patient not taking: Reported on 12/27/2016) 30 tablet 0 Not Taking at Unknown time  . Prenatal Vit-Fe Fumarate-FA (PRENATAL COMPLETE) 14-0.4 MG TABS Take 1 tablet by mouth daily. (Patient not taking: Reported on 11/28/2016) 60 each 5  Not Taking at Unknown time  . promethazine (PHENERGAN) 25 MG tablet Take 1 tablet (25 mg total) by mouth every 6 (six) hours as needed for nausea or vomiting. (Patient not taking: Reported on 01/03/2017) 30 tablet 0 Not Taking at Unknown time    Review of Systems  Constitutional: Negative for chills and fever.  Respiratory: Negative.  Negative for shortness of breath.   Cardiovascular: Negative.  Negative for chest pain.  Gastrointestinal: Positive for abdominal pain and vomiting. Negative for constipation, diarrhea and nausea.  Genitourinary: Positive for vaginal bleeding. Negative for dysuria.  Musculoskeletal: Negative.   Neurological: Positive for dizziness.   Physical Exam   Blood pressure 136/89, pulse 70, temperature 98.5 F (36.9 C), temperature source Oral, resp. rate 18, height 5\' 7"  (1.702 m), SpO2 100 %, not currently breastfeeding.  Physical Exam  Nursing note and vitals reviewed. Constitutional: She is oriented to person, place, and time. She appears well-developed and well-nourished.  HENT:  Head: Normocephalic and atraumatic.  Eyes: Conjunctivae are normal. No scleral icterus.  Cardiovascular: Normal rate, regular rhythm and normal heart sounds.   Respiratory: Effort normal and breath sounds normal. No respiratory distress.  GI: Soft. She exhibits no distension. There is tenderness.  Genitourinary:  Cervix exhibits no motion tenderness and no friability. There is bleeding in the vagina. No vaginal discharge found.  Genitourinary Comments: Small amount of bright red bleeding coming from the os. Two small clots in vagina.  Neurological: She is alert and oriented to person, place, and time.  Skin: Skin is warm and dry.  Psychiatric: She has a normal mood and affect. Her behavior is normal. Judgment and thought content normal.   MAU Course  Procedures Results for orders placed or performed during the hospital encounter of 01/30/17 (from the past 24 hour(s))  CBC      Status: Abnormal   Collection Time: 01/30/17 10:40 AM  Result Value Ref Range   WBC 5.3 4.0 - 10.5 K/uL   RBC 4.56 3.87 - 5.11 MIL/uL   Hemoglobin 11.0 (L) 12.0 - 15.0 g/dL   HCT 40.9 (L) 81.1 - 91.4 %   MCV 74.3 (L) 78.0 - 100.0 fL   MCH 24.1 (L) 26.0 - 34.0 pg   MCHC 32.4 30.0 - 36.0 g/dL   RDW 78.2 (H) 95.6 - 21.3 %   Platelets 274 150 - 400 K/uL   US Transvaginal Non-ob  Result Date: 01/30/2017 CLINICAL DATA:  Bleeding, abdominal pain.  Recent miscarriage. EXAM: TRANSABDOMINAL AND TRANSVAGINAL ULTRASOUND OF PELVIS TECHNIQUE: Both transabdominal and transvaginal ultrasound examinations of the pelvis were performed. Transabdominal technique was performed for global imaging of the pelvis including uterus, ovaries, adnexal regions, and pelvic cul-de-sac. It was necessary to proceed with endovaginal exam following the transabdominal exam to visualize the uterus, endometrium, ovaries and adnexa . COMPARISON:  01/26/2017 FINDINGS: Uterus Measurements: 12.0 x 6.5 x 7.5 cm. Enlarged. No focal myometrial abnormality Endometrium Thickness: 22 mm in thickness, heterogeneous. Complex material noted in the lower uterine segment/ cervical region. Right ovary Measurements: 2.4 x 1.6 x 2.1 cm. Normal appearance/no adnexal mass. Left ovary Measurements: 2.4 x 2.0 x 2.1 cm. Normal appearance/no adnexal mass. Other findings No abnormal free fluid. IMPRESSION: Thickened endometrium. Complex material noted in the region of lower uterine segment/ cervix. While this could reflect blood products, it is difficult to exclude retained products of conception. Electronically Signed   By: Charlett Nose M.D.   On: 01/30/2017 12:07   US Pelvis Complete  Result Date: 01/30/2017 CLINICAL DATA:  Bleeding, abdominal pain.  Recent miscarriage. EXAM: TRANSABDOMINAL AND TRANSVAGINAL ULTRASOUND OF PELVIS TECHNIQUE: Both transabdominal and transvaginal ultrasound examinations of the pelvis were performed. Transabdominal technique was  performed for global imaging of the pelvis including uterus, ovaries, adnexal regions, and pelvic cul-de-sac. It was necessary to proceed with endovaginal exam following the transabdominal exam to visualize the uterus, endometrium, ovaries and adnexa . COMPARISON:  01/26/2017 FINDINGS: Uterus Measurements: 12.0 x 6.5 x 7.5 cm. Enlarged. No focal myometrial abnormality Endometrium Thickness: 22 mm in thickness, heterogeneous. Complex material noted in the lower uterine segment/ cervical region. Right ovary Measurements: 2.4 x 1.6 x 2.1 cm. Normal appearance/no adnexal mass. Left ovary Measurements: 2.4 x 2.0 x 2.1 cm. Normal appearance/no adnexal mass. Other findings No abnormal free fluid. IMPRESSION: Thickened endometrium. Complex material noted in the region of lower uterine segment/ cervix. While this could reflect blood products, it is difficult to exclude retained products of conception. Electronically Signed   By: Charlett Nose M.D.   On: 01/30/2017 12:07   MDM CBC AB Pos blood type IV with LR infusion Dilaudid 1mg  IV- patient reports relief from pain with medication Zofran 4mg  IV US Transvaginal  US Pelvis Complete US shows a thickened  endometrium and questionable blood products vs. products of conception. Discussed with Dr. Alysia Penna- will give patient cytotec buccal before discharge with strict return precautions. Assessment and Plan   1. Retained products of conception after miscarriage   2. Vaginal bleeding   3. Abdominal pain    -Discharge patient home in stable condition. -Strict bleeding and pain precautions reviewed with patient and when to return to MAU -Prescription for dilaudid given to patient -Follow up in [facility] clinic on Monday at 10:00 -Encouraged to return here if she develops worsening of symptoms, increase in pain, fever, or other concerning symptoms.   Cleone Slim SNM 01/30/2017, 1:35 PM   Follow-up Information    [facility]. Go on 02/02/2017.    Why:  at 10 am Contact information: [address] [place] [address] [phone]             I confirm that I have verified the information documented in the CNM student's note and that I have also personally performed the physical exam and all medical decision making activities.  On ultrasound, blood products lower in uterus than previous ultrasound. Patient is stable with normal VS & stable hemoglobin. Will give cytotec & discharge home with close follow up in office on Monday, per consult with Dr. Alysia Penna.  Discussed reasons to return to MAU including increase in bleeding, worsening pain, and/or fever. Patient deemed reliable & agreeable with plan.   Judeth Horn, NP

## 2017-01-30 NOTE — Discharge Instructions (Signed)
Return to care  °· If you have heavier bleeding that soaks through more that 2 pads per hour for an hour or more °· If you bleed so much that you feel like you might pass out or you do pass out °· If you have significant abdominal pain that is not improved with Tylenol  °· If you develop a fever > 100.5 ° °

## 2017-01-30 NOTE — MAU Note (Signed)
Had a miscarriage here on 7/2.  Has been having heavy bleeding since then and the pain is getting worse, feels like she is going through it all over again.  Has been vomiting also.  Having pain in left upper thigh, started yesterday morning, makes it hard to stand or  walk

## 2017-01-31 ENCOUNTER — Inpatient Hospital Stay (HOSPITAL_COMMUNITY)
Admission: AD | Admit: 2017-01-31 | Discharge: 2017-02-01 | Disposition: A | Discharge status: Home / Self Care | Payer: Medicaid Other | Source: Ambulatory Visit | Attending: Obstetrics & Gynecology | Admitting: Obstetrics & Gynecology

## 2017-01-31 DIAGNOSIS — O9089 Other complications of the puerperium, not elsewhere classified: Secondary | ICD-10-CM

## 2017-01-31 DIAGNOSIS — R52 Pain, unspecified: Secondary | ICD-10-CM

## 2017-01-31 DIAGNOSIS — O039 Complete or unspecified spontaneous abortion without complication: Secondary | ICD-10-CM

## 2017-01-31 DIAGNOSIS — R109 Unspecified abdominal pain: Secondary | ICD-10-CM

## 2017-01-31 DIAGNOSIS — Z3A15 15 weeks gestation of pregnancy: Secondary | ICD-10-CM | POA: Insufficient documentation

## 2017-02-01 ENCOUNTER — Encounter (HOSPITAL_COMMUNITY): Payer: Self-pay

## 2017-02-01 ENCOUNTER — Inpatient Hospital Stay (HOSPITAL_COMMUNITY): Payer: Medicaid Other

## 2017-02-01 DIAGNOSIS — O039 Complete or unspecified spontaneous abortion without complication: Secondary | ICD-10-CM

## 2017-02-01 DIAGNOSIS — Z3A15 15 weeks gestation of pregnancy: Secondary | ICD-10-CM | POA: Diagnosis not present

## 2017-02-01 DIAGNOSIS — O9089 Other complications of the puerperium, not elsewhere classified: Secondary | ICD-10-CM | POA: Diagnosis not present

## 2017-02-01 DIAGNOSIS — R109 Unspecified abdominal pain: Secondary | ICD-10-CM | POA: Diagnosis present

## 2017-02-01 DIAGNOSIS — R52 Pain, unspecified: Secondary | ICD-10-CM | POA: Diagnosis not present

## 2017-02-01 DIAGNOSIS — R1030 Lower abdominal pain, unspecified: Secondary | ICD-10-CM

## 2017-02-01 LAB — URINALYSIS, ROUTINE W REFLEX MICROSCOPIC
BACTERIA UA: NONE SEEN
BILIRUBIN URINE: NEGATIVE
Glucose, UA: NEGATIVE mg/dL
Ketones, ur: NEGATIVE mg/dL
LEUKOCYTES UA: NEGATIVE
Nitrite: NEGATIVE
Protein, ur: 30 mg/dL — AB
SPECIFIC GRAVITY, URINE: 1.02 (ref 1.005–1.030)
pH: 6 (ref 5.0–8.0)

## 2017-02-01 LAB — CBC
HEMATOCRIT: 32.8 % — AB (ref 36.0–46.0)
HEMOGLOBIN: 10.8 g/dL — AB (ref 12.0–15.0)
MCH: 23.8 pg — ABNORMAL LOW (ref 26.0–34.0)
MCHC: 32.9 g/dL (ref 30.0–36.0)
MCV: 72.2 fL — ABNORMAL LOW (ref 78.0–100.0)
Platelets: 250 10*3/uL (ref 150–400)
RBC: 4.54 MIL/uL (ref 3.87–5.11)
RDW: 15.4 % (ref 11.5–15.5)
WBC: 7.1 10*3/uL (ref 4.0–10.5)

## 2017-02-01 MED ORDER — HYDROMORPHONE HCL 2 MG/ML IJ SOLN
2.0000 mg | Freq: Once | INTRAMUSCULAR | Status: AC
  Administered 2017-02-01: 2 mg via INTRAMUSCULAR
  Filled 2017-02-01: qty 1

## 2017-02-01 MED ORDER — OXYCODONE-ACETAMINOPHEN 5-325 MG PO TABS: 1.0000 | ORAL_TABLET | Freq: Four times a day (QID) | ORAL | 0 refills | Status: DC | PRN

## 2017-02-01 NOTE — MAU Note (Signed)
Had SAB on 01/26/17. Came back yesterday with abd pain. Was told had blood clots in uterus that needed to pass. Took medicine they gave me and still having a lot of stomach pain. Still having vag bleeding. Used about 5 pads today. Passed small clot Sat am but no more

## 2017-02-01 NOTE — Discharge Instructions (Signed)

## 2017-02-01 NOTE — MAU Note (Signed)
Pt called for Triage and not in lobby. Had told admission personnel she had to run children to someone  and she would return to hosp in about 20 mins

## 2017-02-01 NOTE — MAU Provider Note (Signed)
History     CSN: [id number]  Arrival date and time: 01/31/17 2350   First Provider Initiated Contact with Patient 02/01/17 0109      Chief Complaint  Patient presents with  . Abdominal Pain   [id number] with recent 15 week SAB (6 days ago) here for worsening abdominal pain. She was seen for abdominal pain yesterday and pain has been ongoing since. She has used po Dilaudid and had little relief. Endorses feeling hot and cold but hasn't checked temp. No urinary sx. She endorses changing peripad every hour since yesterday before her arrival to MAU.    OB History    Gravida Para Term Preterm AB Living   5 3 3   2 3    SAB TAB Ectopic Multiple Live Births   2       3      Past Medical History:  Diagnosis Date  . Gallstones     Past Surgical History:  Procedure Laterality Date  . CESAREAN SECTION      Family History  Problem Relation Age of Onset  . Cancer Other     Social History  Substance Use Topics  . Smoking status: Never Smoker  . Smokeless tobacco: Never Used  . Alcohol use No    Allergies: No Known Allergies  Prescriptions Prior to Admission  Medication Sig Dispense Refill Last Dose  . HYDROmorphone (DILAUDID) 2 MG tablet Take 1 tablet (2 mg total) by mouth every 6 (six) hours as needed for severe pain. 20 tablet 0 01/31/2017 at 2130  . ibuprofen (ADVIL,MOTRIN) 600 MG tablet Take 1 tablet (600 mg total) by mouth every 6 (six) hours as needed. 20 tablet 0 01/31/2017 at Unknown time  . Prenatal Vit-Fe Fumarate-FA (PREPLUS) 27-1 MG TABS Take 1 tablet by mouth daily. 30 tablet 1 01/31/2017 at Unknown time  . acetaminophen (TYLENOL) 500 MG tablet Take 1,000 mg by mouth every 6 (six) hours as needed for mild pain.   01/30/2017 at Unknown time  . famotidine (PEPCID) 20 MG tablet Take 1 tablet (20 mg total) by mouth 2 (two) times daily. (Patient not taking: Reported on 12/27/2016) 30 tablet 0 Not Taking at Unknown time  . promethazine (PHENERGAN) 25 MG tablet Take 1 tablet (25 mg  total) by mouth every 6 (six) hours as needed for nausea or vomiting. (Patient not taking: Reported on 01/03/2017) 30 tablet 0 Not Taking at Unknown time    Review of Systems  Constitutional: Negative for chills and fever.  Gastrointestinal: Positive for abdominal pain.  Genitourinary: Positive for vaginal bleeding. Negative for dysuria.   Physical Exam   Blood pressure 131/71, pulse 67, temperature 99.4 F (37.4 C), temperature source Oral, resp. rate 18.  Physical Exam  Constitutional: She is oriented to person, place, and time. She appears well-developed and well-nourished. No distress (appears comfortable).  HENT:  Head: Normocephalic and atraumatic.  Neck: Normal range of motion.  Cardiovascular: Normal rate.   Respiratory: Effort normal. No respiratory distress.  GI: Soft. She exhibits no distension and no mass. There is no tenderness. There is no rebound and no guarding.  Genitourinary:  Genitourinary Comments: External: no lesions or erythema Vagina: rugated, pink, moist, small red bloody discharge Uterus: + enlarged, anteverted, non tender, no CMT Cervix closed   Musculoskeletal: Normal range of motion.  Neurological: She is alert and oriented to person, place, and time.  Skin: Skin is warm and dry.  Psychiatric: She has a normal mood and affect.   Results for  orders placed or performed during the hospital encounter of 01/31/17 (from the past 24 hour(s))  Urinalysis, Routine w reflex microscopic     Status: Abnormal   Collection Time: 02/01/17 12:40 AM  Result Value Ref Range   Color, Urine YELLOW YELLOW   APPearance HAZY (A) CLEAR   Specific Gravity, Urine 1.020 1.005 - 1.030   pH 6.0 5.0 - 8.0   Glucose, UA NEGATIVE NEGATIVE mg/dL   Hgb urine dipstick LARGE (A) NEGATIVE   Bilirubin Urine NEGATIVE NEGATIVE   Ketones, ur NEGATIVE NEGATIVE mg/dL   Protein, ur 30 (A) NEGATIVE mg/dL   Nitrite NEGATIVE NEGATIVE   Leukocytes, UA NEGATIVE NEGATIVE   RBC / HPF TOO  NUMEROUS TO COUNT 0 - 5 RBC/hpf   WBC, UA 0-5 0 - 5 WBC/hpf   Bacteria, UA NONE SEEN NONE SEEN   Squamous Epithelial / LPF 0-5 (A) NONE SEEN   Mucous PRESENT   CBC     Status: Abnormal   Collection Time: 02/01/17  1:04 AM  Result Value Ref Range   WBC 7.1 4.0 - 10.5 K/uL   RBC 4.54 3.87 - 5.11 MIL/uL   Hemoglobin 10.8 (L) 12.0 - 15.0 g/dL   HCT 16.1 (L) 09.6 - 04.5 %   MCV 72.2 (L) 78.0 - 100.0 fL   MCH 23.8 (L) 26.0 - 34.0 pg   MCHC 32.9 30.0 - 36.0 g/dL   RDW 40.9 81.1 - 91.4 %   Platelets 250 150 - 400 K/uL   US Transvaginal Non-ob  Result Date: 02/01/2017 CLINICAL DATA:  Initial evaluation for continued pain status post spontaneous abortion on 07/02, status post Cytotec on 07/06. EXAM: TRANSABDOMINAL AND TRANSVAGINAL ULTRASOUND OF PELVIS TECHNIQUE: Both transabdominal and transvaginal ultrasound examinations of the pelvis were performed. Transabdominal technique was performed for global imaging of the pelvis including uterus, ovaries, adnexal regions, and pelvic cul-de-sac. It was necessary to proceed with endovaginal exam following the transabdominal exam to visualize the uterus and ovaries. COMPARISON:  Prior ultrasound from 01/30/2017. FINDINGS: Uterus Measurements: 11.9 x 6.8 x 7.4 cm. No fibroids or other mass visualized. Endometrium Thickness: 19.8 mm in thickness (previously 22 mm on 01/30/2017). Endometrium is diffusely thickened and heterogeneous without definite associated vascularity. Right ovary Measurements: 3.1 x 2.0 x 1.6 cm. Normal appearance/no adnexal mass. Left ovary Measurements: 3.0 x 1.8 x 1.9 cm. Normal appearance/no adnexal mass. Other findings No abnormal free fluid. IMPRESSION: Thickened and heterogeneous endometrium, measuring up to 20 mm in maximal thickness, previously 22 mm on 01/30/2017. While this could reflect retained blood products, again, it is difficult to exclude retained products of conception. Electronically Signed   By: Rise Mu M.D.   On:  02/01/2017 02:33   MAU Course  Procedures Dilaudid 2 mg IM  MDM Labs and [place] ordered and reviewed. No evidence of infection or retained POCs. Bleeding minimal, Hgb stable. Pain improved after injection. Presentation, clinical findings, and plan discussed with Dr. Debroah Loop. Will have short follow up in [facility] since second visit for abdominal pain after SAB. Stable for discharge home.  Assessment and Plan   1. Postpartum pain   2. SAB (spontaneous abortion)   3. Abdominal pain    Discharge home Follow up in [facility] this week-message sent to pool Rx Percocet Return precautions  Allergies as of 02/01/2017   No Known Allergies     Medication List    STOP taking these medications   acetaminophen 500 MG tablet Commonly known as:  TYLENOL   famotidine  20 MG tablet Commonly known as:  PEPCID   HYDROmorphone 2 MG tablet Commonly known as:  DILAUDID   promethazine 25 MG tablet Commonly known as:  PHENERGAN     TAKE these medications   ibuprofen 600 MG tablet Commonly known as:  ADVIL,MOTRIN Take 1 tablet (600 mg total) by mouth every 6 (six) hours as needed.   oxyCODONE-acetaminophen 5-325 MG tablet Commonly known as:  PERCOCET/ROXICET Take 1-2 tablets by mouth every 6 (six) hours as needed for severe pain.   PREPLUS 27-1 MG Tabs Take 1 tablet by mouth daily.      Donette Larry, CNM 02/01/2017, 1:21 AM

## 2017-02-02 ENCOUNTER — Ambulatory Visit: Payer: Self-pay | Admitting: Family Medicine

## 2017-02-12 ENCOUNTER — Ambulatory Visit (INDEPENDENT_AMBULATORY_CARE_PROVIDER_SITE_OTHER): Payer: Medicaid Other | Admitting: Advanced Practice Midwife

## 2017-02-12 ENCOUNTER — Encounter: Payer: Self-pay | Admitting: Advanced Practice Midwife

## 2017-02-12 VITALS — BP 136/84 | HR 70 | Ht 67.0 in | Wt 204.3 lb

## 2017-02-12 DIAGNOSIS — O039 Complete or unspecified spontaneous abortion without complication: Secondary | ICD-10-CM | POA: Diagnosis not present

## 2017-02-12 MED ORDER — DICLOFENAC SODIUM 75 MG PO TBEC: 75.0000 mg | DELAYED_RELEASE_TABLET | Freq: Two times a day (BID) | ORAL | 2 refills | Status: DC

## 2017-02-12 NOTE — Progress Notes (Signed)
Patient went to MAU on 7/13 due to severe cramping which she is still having every day. Patient has been using Oxycodone for the pain and now she states she is out of them.

## 2017-02-12 NOTE — Progress Notes (Signed)
Subjective:     Patient ID: Julia Armstrong, female   DOB: 11/23/1990, 26 y.o.   MRN: [id number]  Julia Armstrong is 26 y.o. [id number] who had a 15 week SAB on 01/26/17. She reports that she is still passing clots and having a lot of cramping. She has been taking oxycodone for the cramping, but she has run out of this medication.    Gynecologic Exam  The patient's primary symptoms include pelvic pain and vaginal bleeding. This is a new problem. The current episode started 1 to 4 weeks ago. The problem occurs intermittently. Pain severity now: 8/10  The problem affects both sides. She is not pregnant. Associated symptoms include headaches. Pertinent negatives include no fever, nausea or vomiting. The vaginal bleeding is lighter than menses. She has been passing clots. She has not been passing tissue. She has tried NSAIDs and oral narcotics (She has tried just taking ibuprofen, but that alone is not helping. ) for the symptoms.     Review of Systems  Constitutional: Negative for fever.  Gastrointestinal: Negative for nausea and vomiting.  Genitourinary: Positive for pelvic pain. Negative for vaginal bleeding.  Neurological: Positive for headaches.       Objective:   Physical Exam  Constitutional: She is oriented to person, place, and time. She appears well-developed and well-nourished. No distress.  HENT:  Head: Normocephalic.  Cardiovascular: Normal rate.   Pulmonary/Chest: Effort normal.  Abdominal: Soft. There is tenderness. There is no rebound.  Neurological: She is alert and oriented to person, place, and time.  Skin: Skin is warm and dry.  Psychiatric: She has a normal mood and affect.  Nursing note and vitals reviewed.      Assessment:     1. Spontaneous abortion        Plan:     - CBC, HCG today - US complete to evaluate for retained POC - D/W patient that she should be past needing narcotics.  - Will change to Diclofenac 75mg  ER BID to manage pain while results  pending.   Thressa ShellerHeather Hogan 11:26 AM 02/12/17

## 2017-02-13 LAB — CBC WITH DIFFERENTIAL/PLATELET
BASOS ABS: 0 10*3/uL (ref 0.0–0.2)
Basos: 0 %
EOS (ABSOLUTE): 0 10*3/uL (ref 0.0–0.4)
Eos: 1 %
Hematocrit: 36.9 % (ref 34.0–46.6)
Hemoglobin: 11.4 g/dL (ref 11.1–15.9)
Immature Grans (Abs): 0 10*3/uL (ref 0.0–0.1)
Immature Granulocytes: 1 %
LYMPHS ABS: 1.9 10*3/uL (ref 0.7–3.1)
LYMPHS: 35 %
MCH: 23.5 pg — AB (ref 26.6–33.0)
MCHC: 30.9 g/dL — AB (ref 31.5–35.7)
MCV: 76 fL — ABNORMAL LOW (ref 79–97)
MONOS ABS: 0.2 10*3/uL (ref 0.1–0.9)
Monocytes: 4 %
NEUTROS ABS: 3.2 10*3/uL (ref 1.4–7.0)
Neutrophils: 59 %
PLATELETS: 225 10*3/uL (ref 150–379)
RBC: 4.85 x10E6/uL (ref 3.77–5.28)
RDW: 16.8 % — AB (ref 12.3–15.4)
WBC: 5.4 10*3/uL (ref 3.4–10.8)

## 2017-02-13 LAB — BETA HCG QUANT (REF LAB): HCG QUANT: 168 m[IU]/mL

## 2017-02-18 ENCOUNTER — Ambulatory Visit (HOSPITAL_COMMUNITY)
Admission: RE | Admit: 2017-02-18 | Discharge: 2017-02-18 | Disposition: A | Discharge status: Home / Self Care | Payer: Medicaid Other | Source: Ambulatory Visit | Attending: Advanced Practice Midwife | Admitting: Advanced Practice Midwife

## 2017-02-18 ENCOUNTER — Encounter (HOSPITAL_COMMUNITY): Payer: Self-pay

## 2017-02-18 ENCOUNTER — Encounter (HOSPITAL_COMMUNITY)
Admission: AD | Disposition: A | Discharge status: Home / Self Care | Payer: Self-pay | Source: Ambulatory Visit | Attending: Obstetrics & Gynecology

## 2017-02-18 ENCOUNTER — Inpatient Hospital Stay (HOSPITAL_COMMUNITY)
Admission: AD | Admit: 2017-02-18 | Discharge: 2017-02-18 | Disposition: A | Discharge status: Home / Self Care | Payer: Medicaid Other | Source: Ambulatory Visit | Attending: Obstetrics & Gynecology | Admitting: Obstetrics & Gynecology

## 2017-02-18 ENCOUNTER — Inpatient Hospital Stay (HOSPITAL_COMMUNITY): Payer: Medicaid Other | Admitting: Certified Registered Nurse Anesthetist

## 2017-02-18 DIAGNOSIS — O039 Complete or unspecified spontaneous abortion without complication: Secondary | ICD-10-CM

## 2017-02-18 DIAGNOSIS — N83201 Unspecified ovarian cyst, right side: Secondary | ICD-10-CM | POA: Diagnosis not present

## 2017-02-18 DIAGNOSIS — O034 Incomplete spontaneous abortion without complication: Secondary | ICD-10-CM | POA: Diagnosis present

## 2017-02-18 DIAGNOSIS — R1030 Lower abdominal pain, unspecified: Secondary | ICD-10-CM

## 2017-02-18 DIAGNOSIS — O021 Missed abortion: Secondary | ICD-10-CM | POA: Insufficient documentation

## 2017-02-18 HISTORY — PX: DILATION AND EVACUATION: SHX1459

## 2017-02-18 LAB — CBC WITH DIFFERENTIAL/PLATELET
BASOS ABS: 0 10*3/uL (ref 0.0–0.1)
BASOS PCT: 0 %
EOS ABS: 0 10*3/uL (ref 0.0–0.7)
EOS PCT: 1 %
HCT: 36 % (ref 36.0–46.0)
Hemoglobin: 11.4 g/dL — ABNORMAL LOW (ref 12.0–15.0)
Lymphocytes Relative: 32 %
Lymphs Abs: 1.9 10*3/uL (ref 0.7–4.0)
MCH: 24.3 pg — ABNORMAL LOW (ref 26.0–34.0)
MCHC: 31.7 g/dL (ref 30.0–36.0)
MCV: 76.6 fL — AB (ref 78.0–100.0)
MONO ABS: 0.2 10*3/uL (ref 0.1–1.0)
Monocytes Relative: 3 %
Neutro Abs: 3.9 10*3/uL (ref 1.7–7.7)
Neutrophils Relative %: 64 %
PLATELETS: 252 10*3/uL (ref 150–400)
RBC: 4.7 MIL/uL (ref 3.87–5.11)
RDW: 16.2 % — AB (ref 11.5–15.5)
WBC: 6 10*3/uL (ref 4.0–10.5)

## 2017-02-18 LAB — URINALYSIS, ROUTINE W REFLEX MICROSCOPIC
BILIRUBIN URINE: NEGATIVE
GLUCOSE, UA: NEGATIVE mg/dL
HGB URINE DIPSTICK: NEGATIVE
KETONES UR: NEGATIVE mg/dL
Leukocytes, UA: NEGATIVE
Nitrite: NEGATIVE
PROTEIN: NEGATIVE mg/dL
Specific Gravity, Urine: 1.019 (ref 1.005–1.030)
pH: 8 (ref 5.0–8.0)

## 2017-02-18 LAB — BASIC METABOLIC PANEL
ANION GAP: 6 (ref 5–15)
BUN: 10 mg/dL (ref 6–20)
CALCIUM: 8.9 mg/dL (ref 8.9–10.3)
CO2: 25 mmol/L (ref 22–32)
Chloride: 104 mmol/L (ref 101–111)
Creatinine, Ser: 0.62 mg/dL (ref 0.44–1.00)
GFR calc Af Amer: >60 mL/min (ref >60)
GLUCOSE: 95 mg/dL (ref 65–99)
Potassium: 3.7 mmol/L (ref 3.5–5.1)
SODIUM: 135 mmol/L (ref 135–145)

## 2017-02-18 LAB — WET PREP, GENITAL
CLUE CELLS WET PREP: NONE SEEN
Sperm: NONE SEEN
Trich, Wet Prep: NONE SEEN
Yeast Wet Prep HPF POC: NONE SEEN

## 2017-02-18 LAB — HCG, QUANTITATIVE, PREGNANCY: hCG, Beta Chain, Quant, S: 61 m[IU]/mL — ABNORMAL HIGH (ref <5)

## 2017-02-18 SURGERY — DILATION AND EVACUATION, UTERUS
Anesthesia: Monitor Anesthesia Care | Site: Vagina

## 2017-02-18 MED ORDER — LACTATED RINGERS IV SOLN
INTRAVENOUS | Status: DC
  Administered 2017-02-18: 125 mL/h via INTRAVENOUS

## 2017-02-18 MED ORDER — PROPOFOL 10 MG/ML IV BOLUS
INTRAVENOUS | Status: AC
  Filled 2017-02-18: qty 20

## 2017-02-18 MED ORDER — PROMETHAZINE HCL 25 MG/ML IJ SOLN: 6.2500 mg | INTRAMUSCULAR | Status: DC | PRN

## 2017-02-18 MED ORDER — DEXAMETHASONE SODIUM PHOSPHATE 4 MG/ML IJ SOLN
INTRAMUSCULAR | Status: AC
Start: 2017-02-18 — End: ?
  Filled 2017-02-18: qty 1

## 2017-02-18 MED ORDER — LACTATED RINGERS IV SOLN
INTRAVENOUS | Status: DC
  Administered 2017-02-18: 14:00:00 via INTRAVENOUS

## 2017-02-18 MED ORDER — KETOROLAC TROMETHAMINE 30 MG/ML IJ SOLN
INTRAMUSCULAR | Status: AC
  Filled 2017-02-18: qty 1

## 2017-02-18 MED ORDER — LIDOCAINE HCL (CARDIAC) 20 MG/ML IV SOLN
INTRAVENOUS | Status: DC | PRN
  Administered 2017-02-18: 80 mg via INTRAVENOUS

## 2017-02-18 MED ORDER — MEPERIDINE HCL 25 MG/ML IJ SOLN
6.2500 mg | INTRAMUSCULAR | Status: AC | PRN
  Administered 2017-02-18 (×4): 6.25 mg via INTRAVENOUS

## 2017-02-18 MED ORDER — BUPIVACAINE HCL 0.5 % IJ SOLN
INTRAMUSCULAR | Status: DC | PRN
  Administered 2017-02-18: 30 mL

## 2017-02-18 MED ORDER — DEXAMETHASONE SODIUM PHOSPHATE 10 MG/ML IJ SOLN
INTRAMUSCULAR | Status: DC | PRN
  Administered 2017-02-18: 4 mg via INTRAVENOUS

## 2017-02-18 MED ORDER — ATROPINE SULFATE 0.4 MG/ML IJ SOLN
INTRAMUSCULAR | Status: AC
  Filled 2017-02-18: qty 1

## 2017-02-18 MED ORDER — FENTANYL CITRATE (PF) 100 MCG/2ML IJ SOLN
INTRAMUSCULAR | Status: DC | PRN
  Administered 2017-02-18: 100 ug via INTRAVENOUS

## 2017-02-18 MED ORDER — SOD CITRATE-CITRIC ACID 500-334 MG/5ML PO SOLN
30.0000 mL | Freq: Once | ORAL | Status: AC
Start: 2017-02-18 — End: 2017-02-18
  Administered 2017-02-18: 30 mL via ORAL

## 2017-02-18 MED ORDER — MIDAZOLAM HCL 2 MG/2ML IJ SOLN
INTRAMUSCULAR | Status: AC
  Filled 2017-02-18: qty 2

## 2017-02-18 MED ORDER — FENTANYL CITRATE (PF) 100 MCG/2ML IJ SOLN
INTRAMUSCULAR | Status: AC
  Filled 2017-02-18: qty 2

## 2017-02-18 MED ORDER — MEPERIDINE HCL 25 MG/ML IJ SOLN
INTRAMUSCULAR | Status: AC
  Administered 2017-02-18: 6.25 mg via INTRAVENOUS
  Filled 2017-02-18: qty 1

## 2017-02-18 MED ORDER — PROPOFOL 500 MG/50ML IV EMUL
INTRAVENOUS | Status: DC | PRN
  Administered 2017-02-18: 75 ug/kg/min via INTRAVENOUS

## 2017-02-18 MED ORDER — SOD CITRATE-CITRIC ACID 500-334 MG/5ML PO SOLN
ORAL | Status: AC
  Administered 2017-02-18: 30 mL via ORAL
  Filled 2017-02-18: qty 15

## 2017-02-18 MED ORDER — DOXYCYCLINE HYCLATE 100 MG IV SOLR
200.0000 mg | Freq: Once | INTRAVENOUS | Status: AC
  Administered 2017-02-18: 200 mg via INTRAVENOUS
  Filled 2017-02-18: qty 200

## 2017-02-18 MED ORDER — ONDANSETRON HCL 4 MG/2ML IJ SOLN
INTRAMUSCULAR | Status: AC
  Filled 2017-02-18: qty 2

## 2017-02-18 MED ORDER — KETOROLAC TROMETHAMINE 30 MG/ML IJ SOLN
INTRAMUSCULAR | Status: DC | PRN
  Administered 2017-02-18: 30 mg via INTRAVENOUS

## 2017-02-18 MED ORDER — OXYCODONE-ACETAMINOPHEN 5-325 MG PO TABS: 1.0000 | ORAL_TABLET | Freq: Four times a day (QID) | ORAL | 0 refills | Status: DC | PRN

## 2017-02-18 MED ORDER — ONDANSETRON HCL 4 MG/2ML IJ SOLN
INTRAMUSCULAR | Status: DC | PRN
  Administered 2017-02-18: 4 mg via INTRAVENOUS

## 2017-02-18 MED ORDER — DOCUSATE SODIUM 100 MG PO CAPS: 100.0000 mg | ORAL_CAPSULE | Freq: Two times a day (BID) | ORAL | 2 refills | Status: DC | PRN

## 2017-02-18 MED ORDER — FENTANYL CITRATE (PF) 100 MCG/2ML IJ SOLN
25.0000 ug | INTRAMUSCULAR | Status: DC | PRN
  Administered 2017-02-18 (×3): 25 ug via INTRAVENOUS

## 2017-02-18 MED ORDER — IBUPROFEN 600 MG PO TABS: 600.0000 mg | ORAL_TABLET | Freq: Four times a day (QID) | ORAL | 2 refills | Status: DC | PRN

## 2017-02-18 MED ORDER — MIDAZOLAM HCL 2 MG/2ML IJ SOLN
INTRAMUSCULAR | Status: AC
Start: 2017-02-18 — End: 2017-02-18
  Filled 2017-02-18: qty 2

## 2017-02-18 MED ORDER — AZITHROMYCIN 250 MG PO TABS
ORAL_TABLET | ORAL | Status: AC
  Administered 2017-02-18: 1000 mg via ORAL
  Filled 2017-02-18: qty 4

## 2017-02-18 MED ORDER — AZITHROMYCIN 250 MG PO TABS
1000.0000 mg | ORAL_TABLET | Freq: Once | ORAL | Status: AC
  Administered 2017-02-18: 1000 mg via ORAL

## 2017-02-18 MED ORDER — LIDOCAINE HCL (CARDIAC) 20 MG/ML IV SOLN
INTRAVENOUS | Status: AC
  Filled 2017-02-18: qty 5

## 2017-02-18 MED ORDER — DEXAMETHASONE SODIUM PHOSPHATE 4 MG/ML IJ SOLN
INTRAMUSCULAR | Status: AC
  Filled 2017-02-18: qty 1

## 2017-02-18 MED ORDER — PROPOFOL 500 MG/50ML IV EMUL
INTRAVENOUS | Status: DC | PRN
  Administered 2017-02-18: 70 mg via INTRAVENOUS

## 2017-02-18 MED ORDER — MIDAZOLAM HCL 2 MG/2ML IJ SOLN
INTRAMUSCULAR | Status: DC | PRN
  Administered 2017-02-18: 2 mg via INTRAVENOUS

## 2017-02-18 MED ORDER — FENTANYL CITRATE (PF) 100 MCG/2ML IJ SOLN
INTRAMUSCULAR | Status: AC
  Administered 2017-02-18: 25 ug via INTRAVENOUS
  Filled 2017-02-18: qty 2

## 2017-02-18 SURGICAL SUPPLY — 19 items
CATH ROBINSON RED A/P 16FR (CATHETERS) ×2 IMPLANT
CLOTH BEACON ORANGE TIMEOUT ST (SAFETY) ×2 IMPLANT
DECANTER SPIKE VIAL GLASS SM (MISCELLANEOUS) ×2 IMPLANT
GLOVE BIOGEL PI IND STRL 7.0 (GLOVE) ×3 IMPLANT
GLOVE BIOGEL PI INDICATOR 7.0 (GLOVE) ×3
GLOVE ECLIPSE 7.0 STRL STRAW (GLOVE) ×2 IMPLANT
GOWN STRL REUS W/TWL LRG LVL3 (GOWN DISPOSABLE) ×4 IMPLANT
KIT BERKELEY 1ST TRIMESTER 3/8 (MISCELLANEOUS) ×2 IMPLANT
NS IRRIG 1000ML POUR BTL (IV SOLUTION) ×2 IMPLANT
PACK VAGINAL MINOR WOMEN LF (CUSTOM PROCEDURE TRAY) ×2 IMPLANT
PAD OB MATERNITY 4.3X12.25 (PERSONAL CARE ITEMS) ×2 IMPLANT
PAD PREP 24X48 CUFFED NSTRL (MISCELLANEOUS) ×2 IMPLANT
SET BERKELEY SUCTION TUBING (SUCTIONS) ×2 IMPLANT
TOWEL OR 17X24 6PK STRL BLUE (TOWEL DISPOSABLE) ×4 IMPLANT
VACURETTE 10 RIGID CVD (CANNULA) IMPLANT
VACURETTE 6 ASPIR F TIP BERK (CANNULA) IMPLANT
VACURETTE 7MM CVD STRL WRAP (CANNULA) IMPLANT
VACURETTE 8 RIGID CVD (CANNULA) ×2 IMPLANT
VACURETTE 9 RIGID CVD (CANNULA) IMPLANT

## 2017-02-18 NOTE — H&P (Signed)
Preoperative History and Physical  Julia Armstrong is a 26 y.o. [id number] here for surgical management of retained POCSs. No other significant preoperative concerns.  Proposed surgery: Dilation and Evacuation  Past Medical History:  Diagnosis Date  . Gallstones    Past Surgical History:  Procedure Laterality Date  . CESAREAN SECTION     OB History  Gravida Para Term Preterm AB Living  5 3 3   2 3   SAB TAB Ectopic Multiple Live Births  2       3    # Outcome Date GA Lbr Len/2nd Weight Sex Delivery Anes PTL Lv  5 SAB           4 Term      CS-LTranv     3 Term      CS-LTranv     2 Term      CS-LTranv     1 SAB             Patient denies any other pertinent gynecologic issues.   No current facility-administered medications on file prior to encounter.    Current Outpatient Prescriptions on File Prior to Encounter  Medication Sig Dispense Refill  . diclofenac (VOLTAREN) 75 MG EC tablet Take 1 tablet (75 mg total) by mouth 2 (two) times daily with a meal. 60 tablet 2  . ibuprofen (ADVIL,MOTRIN) 600 MG tablet Take 1 tablet (600 mg total) by mouth every 6 (six) hours as needed. (Patient taking differently: Take 600 mg by mouth every 6 (six) hours as needed for moderate pain. ) 20 tablet 0  . Prenatal Vit-Fe Fumarate-FA (PREPLUS) 27-1 MG TABS Take 1 tablet by mouth daily. 30 tablet 1  . oxyCODONE-acetaminophen (PERCOCET/ROXICET) 5-325 MG tablet Take 1-2 tablets by mouth every 6 (six) hours as needed for severe pain. 10 tablet 0   No Known Allergies  Social History:   reports that she has never smoked. She has never used smokeless tobacco. She reports that she does not drink alcohol or use drugs.  Family History  Problem Relation Age of Onset  . Cancer Other     Review of Systems: Noncontributory  PHYSICAL EXAM: Blood pressure (!) 133/94, pulse 93, temperature 97.8 F (36.6 C), resp. rate 18, weight 205 lb (93 kg). CONSTITUTIONAL: Well-developed, well-nourished female in no  acute distress.  HENT:  Normocephalic, atraumatic, External right and left ear normal. Oropharynx is clear and moist EYES: Conjunctivae and EOM are normal. Pupils are equal, round, and reactive to light. No scleral icterus.  NECK: Normal range of motion, supple, no masses SKIN: Skin is warm and dry. No rash noted. Not diaphoretic. No erythema. No pallor. NEUROLOGIC: Alert and oriented to person, place, and time. Normal reflexes, muscle tone coordination. No cranial nerve deficit noted. PSYCHIATRIC: Normal mood and affect. Normal behavior. Normal judgment and thought content. CARDIOVASCULAR: Normal heart rate noted, regular rhythm RESPIRATORY: Effort and breath sounds normal, no problems with respiration noted ABDOMEN: Soft, nontender, nondistended. PELVIC: Deferred MUSCULOSKELETAL: Normal range of motion. No edema and no tenderness. 2+ distal pulses.  Labs: Results for orders placed or performed during the hospital encounter of 02/18/17 (from the past 336 hour(s))  Urinalysis, Routine w reflex microscopic   Collection Time: 02/18/17 11:50 AM  Result Value Ref Range   Color, Urine YELLOW YELLOW   APPearance CLEAR CLEAR   Specific Gravity, Urine 1.019 1.005 - 1.030   pH 8.0 5.0 - 8.0   Glucose, UA NEGATIVE NEGATIVE mg/dL   Hgb urine dipstick NEGATIVE  NEGATIVE   Bilirubin Urine NEGATIVE NEGATIVE   Ketones, ur NEGATIVE NEGATIVE mg/dL   Protein, ur NEGATIVE NEGATIVE mg/dL   Nitrite NEGATIVE NEGATIVE   Leukocytes, UA NEGATIVE NEGATIVE  Wet prep, genital   Collection Time: 02/18/17 12:41 PM  Result Value Ref Range   Yeast Wet Prep HPF POC NONE SEEN NONE SEEN   Trich, Wet Prep NONE SEEN NONE SEEN   Clue Cells Wet Prep HPF POC NONE SEEN NONE SEEN   WBC, Wet Prep HPF POC MODERATE (A) NONE SEEN   Sperm NONE SEEN   CBC with Differential/Platelet   Collection Time: 02/18/17 12:47 PM  Result Value Ref Range   WBC 6.0 4.0 - 10.5 K/uL   RBC 4.70 3.87 - 5.11 MIL/uL   Hemoglobin 11.4 (L) 12.0  - 15.0 g/dL   HCT 21.3 08.6 - 57.8 %   MCV 76.6 (L) 78.0 - 100.0 fL   MCH 24.3 (L) 26.0 - 34.0 pg   MCHC 31.7 30.0 - 36.0 g/dL   RDW 46.9 (H) 62.9 - 52.8 %   Platelets 252 150 - 400 K/uL   Neutrophils Relative % 64 %   Neutro Abs 3.9 1.7 - 7.7 K/uL   Lymphocytes Relative 32 %   Lymphs Abs 1.9 0.7 - 4.0 K/uL   Monocytes Relative 3 %   Monocytes Absolute 0.2 0.1 - 1.0 K/uL   Eosinophils Relative 1 %   Eosinophils Absolute 0.0 0.0 - 0.7 K/uL   Basophils Relative 0 %   Basophils Absolute 0.0 0.0 - 0.1 K/uL  Basic metabolic panel   Collection Time: 02/18/17 12:47 PM  Result Value Ref Range   Sodium 135 135 - 145 mmol/L   Potassium 3.7 3.5 - 5.1 mmol/L   Chloride 104 101 - 111 mmol/L   CO2 25 22 - 32 mmol/L   Glucose, Bld 95 65 - 99 mg/dL   BUN 10 6 - 20 mg/dL   Creatinine, Ser 4.13 0.44 - 1.00 mg/dL   Calcium 8.9 8.9 - 24.4 mg/dL   GFR calc non Af Amer >60 >60 mL/min   GFR calc Af Amer >60 >60 mL/min   Anion gap 6 5 - 15  hCG, quantitative, pregnancy   Collection Time: 02/18/17 12:47 PM  Result Value Ref Range   hCG, Beta Chain, Quant, S 61 (H) <5 mIU/mL  Results for orders placed or performed in visit on 02/12/17 (from the past 336 hour(s))  CBC w/Diff   Collection Time: 02/12/17 11:27 AM  Result Value Ref Range   WBC 5.4 3.4 - 10.8 x10E3/uL   RBC 4.85 3.77 - 5.28 x10E6/uL   Hemoglobin 11.4 11.1 - 15.9 g/dL   Hematocrit 01.0 27.2 - 46.6 %   MCV 76 (L) 79 - 97 fL   MCH 23.5 (L) 26.6 - 33.0 pg   MCHC 30.9 (L) 31.5 - 35.7 g/dL   RDW 53.6 (H) 64.4 - 03.4 %   Platelets 225 150 - 379 x10E3/uL   Neutrophils 59 Not Estab. %   Lymphs 35 Not Estab. %   Monocytes 4 Not Estab. %   Eos 1 Not Estab. %   Basos 0 Not Estab. %   Neutrophils Absolute 3.2 1.4 - 7.0 x10E3/uL   Lymphocytes Absolute 1.9 0.7 - 3.1 x10E3/uL   Monocytes Absolute 0.2 0.1 - 0.9 x10E3/uL   EOS (ABSOLUTE) 0.0 0.0 - 0.4 x10E3/uL   Basophils Absolute 0.0 0.0 - 0.2 x10E3/uL   Immature Granulocytes 1 Not Estab.  %  Immature Grans (Abs) 0.0 0.0 - 0.1 x10E3/uL  Beta HCG, Quant   Collection Time: 02/12/17 11:27 AM  Result Value Ref Range   hCG Quant 168 mIU/mL    Imaging Studies: US Transvaginal Non-ob  Result Date: 02/18/2017 CLINICAL DATA:  Continued vaginal bleeding and pelvic pain. Spontaneous abortion approximately 2 weeks ago. EXAM: ULTRASOUND PELVIS TRANSVAGINAL TECHNIQUE: Transvaginal ultrasound examination of the pelvis was performed including evaluation of the uterus, ovaries, adnexal regions, and pelvic cul-de-sac. COMPARISON:  02/01/2017 FINDINGS: Uterus Measurements: 9.6 x 5.9 x 7.4 cm. Retroflexed. No fibroids identified. Endometrium Thickness: Ill-defined mildly hyperechoic mass is seen in the fundal portion of the uterus which measures approximately 2.7 x 2.1 cm and shows internal vascularity on color Doppler ultrasound. This is consistent with retained products of conception. Right ovary Measurements: 5.2 x 4.2 x 4.2 cm. Cyst with single thin septation measuring 4.0 x 3.8 x 2.7 cm. This has benign characteristics. Left ovary Measurements: 3.6 x 1.9 x 1.9 cm. Normal appearance/no adnexal mass. Other findings:  No abnormal free fluid IMPRESSION: 2.7 cm ill-defined hypoechoic mass in fundal portion of endometrial cavity, consistent with retained products of conception. 4 cm right ovarian cyst with benign characteristics. Electronically Signed   By: Myles Rosenthal M.D.   On: 02/18/2017 13:00   US Transvaginal Non-ob  Result Date: 02/01/2017 CLINICAL DATA:  Initial evaluation for continued pain status post spontaneous abortion on 07/02, status post Cytotec on 07/06. EXAM: TRANSABDOMINAL AND TRANSVAGINAL ULTRASOUND OF PELVIS TECHNIQUE: Both transabdominal and transvaginal ultrasound examinations of the pelvis were performed. Transabdominal technique was performed for global imaging of the pelvis including uterus, ovaries, adnexal regions, and pelvic cul-de-sac. It was necessary to proceed with  endovaginal exam following the transabdominal exam to visualize the uterus and ovaries. COMPARISON:  Prior ultrasound from 01/30/2017. FINDINGS: Uterus Measurements: 11.9 x 6.8 x 7.4 cm. No fibroids or other mass visualized. Endometrium Thickness: 19.8 mm in thickness (previously 22 mm on 01/30/2017). Endometrium is diffusely thickened and heterogeneous without definite associated vascularity. Right ovary Measurements: 3.1 x 2.0 x 1.6 cm. Normal appearance/no adnexal mass. Left ovary Measurements: 3.0 x 1.8 x 1.9 cm. Normal appearance/no adnexal mass. Other findings No abnormal free fluid. IMPRESSION: Thickened and heterogeneous endometrium, measuring up to 20 mm in maximal thickness, previously 22 mm on 01/30/2017. While this could reflect retained blood products, again, it is difficult to exclude retained products of conception. Electronically Signed   By: Rise Mu M.D.   On: 02/01/2017 02:33   US Transvaginal Non-ob  Result Date: 01/30/2017 CLINICAL DATA:  Bleeding, abdominal pain.  Recent miscarriage. EXAM: TRANSABDOMINAL AND TRANSVAGINAL ULTRASOUND OF PELVIS TECHNIQUE: Both transabdominal and transvaginal ultrasound examinations of the pelvis were performed. Transabdominal technique was performed for global imaging of the pelvis including uterus, ovaries, adnexal regions, and pelvic cul-de-sac. It was necessary to proceed with endovaginal exam following the transabdominal exam to visualize the uterus, endometrium, ovaries and adnexa . COMPARISON:  01/26/2017 FINDINGS: Uterus Measurements: 12.0 x 6.5 x 7.5 cm. Enlarged. No focal myometrial abnormality Endometrium Thickness: 22 mm in thickness, heterogeneous. Complex material noted in the lower uterine segment/ cervical region. Right ovary Measurements: 2.4 x 1.6 x 2.1 cm. Normal appearance/no adnexal mass. Left ovary Measurements: 2.4 x 2.0 x 2.1 cm. Normal appearance/no adnexal mass. Other findings No abnormal free fluid. IMPRESSION: Thickened  endometrium. Complex material noted in the region of lower uterine segment/ cervix. While this could reflect blood products, it is difficult to exclude retained products of conception. Electronically  Signed   By: Charlett Nose M.D.   On: 01/30/2017 12:07   US Transvaginal Non-ob  Result Date: 01/26/2017 CLINICAL DATA:  Status post spontaneous abortion, with persistent active bleeding. Assess for retained products of conception. Initial encounter. EXAM: TRANSABDOMINAL AND TRANSVAGINAL ULTRASOUND OF PELVIS TECHNIQUE: Both transabdominal and transvaginal ultrasound examinations of the pelvis were performed. Transabdominal technique was performed for global imaging of the pelvis including uterus, ovaries, adnexal regions, and pelvic cul-de-sac. It was necessary to proceed with endovaginal exam following the transabdominal exam to visualize the endometrium. COMPARISON:  Pelvic ultrasound performed 01/03/2017 FINDINGS: Uterus Measurements: 13.4 x 7.2 x 8.7 cm. No fibroids or other mass visualized. Endometrium Thickness: 2.5 cm. Somewhat heterogeneous, likely reflecting mild underlying clot. No definite evidence for retained products of conception. Right ovary Not visualized. Left ovary Measurements: 3.3 x 2.2 x 3.0 cm. Normal appearance/no adnexal mass. Other findings No abnormal free fluid. IMPRESSION: No definite evidence for retained products of conception. Endometrial echo complex is somewhat heterogeneous, likely reflecting mild underlying clot. Electronically Signed   By: Roanna Raider M.D.   On: 01/26/2017 03:58   US Pelvis Complete  Result Date: 01/30/2017 CLINICAL DATA:  Bleeding, abdominal pain.  Recent miscarriage. EXAM: TRANSABDOMINAL AND TRANSVAGINAL ULTRASOUND OF PELVIS TECHNIQUE: Both transabdominal and transvaginal ultrasound examinations of the pelvis were performed. Transabdominal technique was performed for global imaging of the pelvis including uterus, ovaries, adnexal regions, and pelvic  cul-de-sac. It was necessary to proceed with endovaginal exam following the transabdominal exam to visualize the uterus, endometrium, ovaries and adnexa . COMPARISON:  01/26/2017 FINDINGS: Uterus Measurements: 12.0 x 6.5 x 7.5 cm. Enlarged. No focal myometrial abnormality Endometrium Thickness: 22 mm in thickness, heterogeneous. Complex material noted in the lower uterine segment/ cervical region. Right ovary Measurements: 2.4 x 1.6 x 2.1 cm. Normal appearance/no adnexal mass. Left ovary Measurements: 2.4 x 2.0 x 2.1 cm. Normal appearance/no adnexal mass. Other findings No abnormal free fluid. IMPRESSION: Thickened endometrium. Complex material noted in the region of lower uterine segment/ cervix. While this could reflect blood products, it is difficult to exclude retained products of conception. Electronically Signed   By: Charlett Nose M.D.   On: 01/30/2017 12:07   US Pelvis Complete  Result Date: 01/26/2017 CLINICAL DATA:  Status post spontaneous abortion, with persistent active bleeding. Assess for retained products of conception. Initial encounter. EXAM: TRANSABDOMINAL AND TRANSVAGINAL ULTRASOUND OF PELVIS TECHNIQUE: Both transabdominal and transvaginal ultrasound examinations of the pelvis were performed. Transabdominal technique was performed for global imaging of the pelvis including uterus, ovaries, adnexal regions, and pelvic cul-de-sac. It was necessary to proceed with endovaginal exam following the transabdominal exam to visualize the endometrium. COMPARISON:  Pelvic ultrasound performed 01/03/2017 FINDINGS: Uterus Measurements: 13.4 x 7.2 x 8.7 cm. No fibroids or other mass visualized. Endometrium Thickness: 2.5 cm. Somewhat heterogeneous, likely reflecting mild underlying clot. No definite evidence for retained products of conception. Right ovary Not visualized. Left ovary Measurements: 3.3 x 2.2 x 3.0 cm. Normal appearance/no adnexal mass. Other findings No abnormal free fluid. IMPRESSION: No  definite evidence for retained products of conception. Endometrial echo complex is somewhat heterogeneous, likely reflecting mild underlying clot. Electronically Signed   By: Roanna Raider M.D.   On: 01/26/2017 03:58    Assessment: Patient Active Problem List   Diagnosis Date Noted  . Retained products of conception after miscarriage 02/18/2017    Plan: Patient will undergo surgical management with Dilation and Evacuation for retained products.   The risks of surgery  were discussed in detail with the patient including but not limited to: bleeding which may require transfusion; infection which may require antibiotics; injury to uterus or surrounding organs; need for additional procedures including laparotomy or laparoscopy; possibility of intrauterine scarring which may impair future fertility; and other postoperative/anesthesia complications.  Likelihood of success in alleviating the patient's condition was discussed. Routine postoperative instructions will be reviewed with the patient and her family in detail after surgery.  The patient concurred with the proposed plan, giving informed written consent for the surgery.  Patient has been NPO since last night and she will remain NPO for procedure.  Anesthesia and OR aware.  Preoperative prophylactic antibiotics and SCDs ordered on call to the OR.  To OR when ready.  Jaynie CollinsUGONNA  Zeffie Bickert, M.D. 02/18/2017 2:23 PM

## 2017-02-18 NOTE — Progress Notes (Signed)
On arrival to PACU pt crying, c/o pain at IV site, Doxycycline infusing... IV site patent, no redness or swelling, infusing well. Dr Macon LargeAnyanwu at bedside, ordered to d/c Doxycycline infusion now and will order PO Zithromax. Doxycycline d/c'd.

## 2017-02-18 NOTE — Anesthesia Preprocedure Evaluation (Addendum)
Anesthesia Evaluation  Patient identified by MRN, date of birth, ID band Patient awake    Reviewed: Allergy & Precautions, NPO status , Patient's Chart, lab work & pertinent test results  Airway Mallampati: III  TM Distance: >3 FB Neck ROM: Full    Dental  (+) Teeth Intact, Dental Advisory Given   Pulmonary neg pulmonary ROS,    breath sounds clear to auscultation       Cardiovascular negative cardio ROS   Rhythm:Regular Rate:Normal     Neuro/Psych negative neurological ROS  negative psych ROS   GI/Hepatic negative GI ROS, Neg liver ROS,   Endo/Other  negative endocrine ROS  Renal/GU negative Renal ROS     Musculoskeletal negative musculoskeletal ROS (+)   Abdominal   Peds  Hematology negative hematology ROS (+)   Anesthesia Other Findings Day of surgery medications reviewed with the patient.  Reproductive/Obstetrics negative OB ROS                             Lab Results  Component Value Date   WBC 6.0 02/18/2017   HGB 11.4 (L) 02/18/2017   HCT 36.0 02/18/2017   MCV 76.6 (L) 02/18/2017   PLT 252 02/18/2017     Anesthesia Physical Anesthesia Plan  ASA: II and emergent  Anesthesia Plan: MAC   Post-op Pain Management:    Induction: Intravenous  PONV Risk Score and Plan: 3 and Ondansetron, Dexamethasone, Propofol and Midazolam  Airway Management Planned: Natural Airway and Mask  Additional Equipment:   Intra-op Plan:   Post-operative Plan:   Informed Consent: I have reviewed the patients History and Physical, chart, labs and discussed the procedure including the risks, benefits and alternatives for the proposed anesthesia with the patient or authorized representative who has indicated his/her understanding and acceptance.   Dental advisory given  Plan Discussed with: CRNA  Anesthesia Plan Comments: (Water & 1 pecan nut early (5AM)  this morning per patient. )         Anesthesia Quick Evaluation

## 2017-02-18 NOTE — MA[facility] Provider Note (Signed)
[facility]   CSN: [id number] Arrival date & time: 02/18/17  1135     History   Chief Complaint Chief Complaint  Patient presents with  . Back Pain  . Abdominal Pain    HPI Julia Armstrong is a 26 y.o. [id number] sent from the GYN clinic for evaluation of abdominal pain s/p SAB 01/26/17. Patient reports that since the SAB she has continued to have pain and bleeding. Patient was [redacted] weeks gestation at the time of the SAB. Ultrasound on 7/8 showed ? RPOC. Patient reports she returned 7/19 and was told she had no retained products. Because of her continued pain the clinic sent her to MAU for evaluation.  Patient states that she has not been sexually active since the miscarriage.  HPI  Past Medical History:  Diagnosis Date  . Gallstones     There are no active problems to display for this patient.   Past Surgical History:  Procedure Laterality Date  . CESAREAN SECTION      OB History    Gravida Para Term Preterm AB Living   5 3 3   2 3    SAB TAB Ectopic Multiple Live Births   2       3       Home Medications    Prior to Admission medications   Medication Sig Start Date End Date Taking? Authorizing Provider  diclofenac (VOLTAREN) 75 MG EC tablet Take 1 tablet (75 mg total) by mouth 2 (two) times daily with a meal. 02/12/17  Yes Thressa ShellerHogan, Heather D, CNM  ibuprofen (ADVIL,MOTRIN) 600 MG tablet Take 1 tablet (600 mg total) by mouth every 6 (six) hours as needed. Patient taking differently: Take 600 mg by mouth every 6 (six) hours as needed for moderate pain.  01/26/17  Yes Judeth HornLawrence, Erin, NP  Prenatal Vit-Fe Fumarate-FA (PREPLUS) 27-1 MG TABS Take 1 tablet by mouth daily. 01/30/17  Yes Judeth HornLawrence, Erin, NP  oxyCODONE-acetaminophen (PERCOCET/ROXICET) 5-325 MG tablet Take 1-2 tablets by mouth every 6 (six) hours as needed for severe pain. 02/01/17   Donette LarryBhambri, Melanie, CNM    Family History Family History  Problem Relation Age of Onset  . Cancer Other     Social  History Social History  Substance Use Topics  . Smoking status: Never Smoker  . Smokeless tobacco: Never Used  . Alcohol use No     Allergies   Patient has no known allergies.   Review of Systems Review of Systems  Constitutional: Negative for chills and fever.  HENT: Negative.   Eyes: Negative for visual disturbance.  Respiratory: Negative.   Cardiovascular: Negative for leg swelling.  Gastrointestinal: Positive for abdominal pain. Negative for nausea and vomiting.  Genitourinary: Negative for dysuria, frequency, urgency, vaginal bleeding and vaginal discharge.  Musculoskeletal: Positive for back pain.  Skin: Negative for rash.  Neurological: Positive for light-headedness. Negative for syncope and headaches.  Psychiatric/Behavioral: Negative for confusion.     Physical Exam Updated Vital Signs BP (!) 133/94   Pulse 93   Temp 97.8 F (36.6 C)   Resp 18   Wt 205 lb (93 kg)   BMI 32.11 kg/m   Physical Exam  Constitutional: She is oriented to person, place, and time. She appears well-developed and well-nourished. No distress.  HENT:  Head: Normocephalic.  Eyes: EOM are normal.  Neck: Neck supple.  Cardiovascular: Normal rate and regular rhythm.   Pulmonary/Chest: Effort normal and breath sounds normal.  Abdominal: Soft. Bowel sounds are normal.  She exhibits no pulsatile midline mass. There is tenderness in the right lower quadrant, suprapubic area and left lower quadrant.  Right flank pain  Genitourinary:  Genitourinary Comments: External genitalia without lesions, scant blood vaginal vault, positive CMT, right adnexal tenderness with fullness. Uterus slightly enlarged.   Musculoskeletal: Normal range of motion.  Neurological: She is alert and oriented to person, place, and time. No cranial nerve deficit.  Skin: Skin is warm and dry.  Psychiatric: She has a normal mood and affect. Her behavior is normal.  Nursing note and vitals reviewed.    ED Treatments /  Results  Labs (all labs ordered are listed, but only abnormal results are displayed) Labs Reviewed  WET PREP, GENITAL - Abnormal; Notable for the following:       Result Value   WBC, Wet Prep HPF POC MODERATE (*)    All other components within normal limits  CBC WITH DIFFERENTIAL/PLATELET - Abnormal; Notable for the following:    Hemoglobin 11.4 (*)    MCV 76.6 (*)    MCH 24.3 (*)    RDW 16.2 (*)    All other components within normal limits  HCG, QUANTITATIVE, PREGNANCY - Abnormal; Notable for the following:    hCG, Beta Chain, Quant, S 61 (*)    All other components within normal limits  URINALYSIS, ROUTINE W REFLEX MICROSCOPIC  BASIC METABOLIC PANEL  GC/CHLAMYDIA PROBE AMP ([place]) NOT [facility]     Rad[facility] [place] Transvaginal Non-ob  Result Date: 02/18/2017 CLINICAL DATA:  Continued vaginal bleeding and pelvic pain. Spontaneous abortion approximately 2 weeks ago. EXAM: ULTRASOUND PELVIS TRANSVAGINAL TECHNIQUE: Transvaginal ultrasound examination of the pelvis was performed including evaluation of the uterus, ovaries, adnexal regions, and pelvic cul-de-sac. COMPARISON:  02/01/2017 FINDINGS: Uterus Measurements: 9.6 x 5.9 x 7.4 cm. Retroflexed. No fibroids identified. Endometrium Thickness: Ill-defined mildly hyperechoic mass is seen in the fundal portion of the uterus which measures approximately 2.7 x 2.1 cm and shows internal vascularity on color Doppler ultrasound. This is consistent with retained products of conception. Right ovary Measurements: 5.2 x 4.2 x 4.2 cm. Cyst with single thin septation measuring 4.0 x 3.8 x 2.7 cm. This has benign characteristics. Left ovary Measurements: 3.6 x 1.9 x 1.9 cm. Normal appearance/no adnexal mass. Other findings:  No abnormal free fluid IMPRESSION: 2.7 cm ill-defined hypoechoic mass in fundal portion of endometrial cavity, consistent with retained products of conception. 4 cm right ovarian cyst with benign characteristics. Electronically Signed    By: Myles RosenthalJohn  Stahl M.D.   On: 02/18/2017 13:00    Procedures Procedures (including critical care time)  Medications Ordered in ED Medications  lactated ringers infusion (not administered)     Initial Impression / Assessment and Plan / ED Course  I have reviewed the triage vital signs and the nursing notes.  Pertinent labs & imaging results that were available during my care of the patient were reviewed by me and considered in my medical decision making (see chart for details).   Final Clinical Impressions(s) / ED Diagnoses  26 y.o. female with continued pain and bleeding s/p SAB at [redacted] weeks gestation 01/26/17. Discussed with Dr. Macon LargeAnyanwu and ultrasound results reviewed. Will take patient to the OR for D&E for RPOC. Discussed benefits and risks with the patient and all questions answered.   Final diagnoses:  Retained products of conception after miscarriage  Ovarian cyst, right  Lower abdominal pain    New Prescriptions Current Discharge Medication List

## 2017-02-18 NOTE — Op Note (Signed)
Julia Armstrong PROCEDURE DATE: 02/18/2017  PREOPERATIVE DIAGNOSIS: Retained products of conception after a 15 week spontaneous abortion POSTOPERATIVE DIAGNOSIS: The same PROCEDURE:     Dilation and Evacuation SURGEON:  Dr. Jaynie CollinsUgonna Jahlen Bollman  INDICATIONS: 26 y.o. Julia Armstrong with retained products of conception, needing surgical completion.  Risks of surgery were discussed with the patient including but not limited to: bleeding which may require transfusion; infection which may require antibiotics; injury to uterus or surrounding organs; need for additional procedures including laparotomy or laparoscopy; possibility of intrauterine scarring which may impair future fertility; and other postoperative/anesthesia complications. Written informed consent was obtained.    FINDINGS:  A 10 week size uterus, moderate amounts of products of conception, specimen sent to pathology.  ANESTHESIA:    Monitored intravenous sedation, paracervical block. ESTIMATED BLOOD LOSS:  10 ml. SPECIMENS:  Products of conception sent to pathology COMPLICATIONS:  None immediate.  PROCEDURE DETAILS:  The patient received intravenous Doxycycline while in the preoperative area.  She was then taken to the operating room where monitored intravenous sedation was administered and was found to be adequate.  After an adequate timeout was performed, she was placed in the dorsal lithotomy position and examined; then prepped and draped in the sterile manner.   Her bladder was catheterized for an unmeasured amount of clear, yellow urine. A vaginal speculum was then placed in the patient's vagina and a single tooth tenaculum was applied to the anterior lip of the cervix.  A paracervical block using 30 ml of 0.5% Marcaine was administered. The cervix was gently dilated to accommodate a 8 mm suction curette that was gently advanced to the uterine fundus.  The suction device was then activated and curette slowly rotated to clear the uterus of products  of conception.  A sharp curettage was then performed to confirm complete emptying of the uterus. There was minimal bleeding noted and the tenaculum removed with good hemostasis noted.   All instruments were removed from the patient's vagina.  Sponge and instrument counts were correct times two  The patient tolerated the procedure well and was taken to the recovery area awake, and in stable condition.  The patient will be discharged to home as per PACU criteria.  Routine postoperative instructions given.  She was prescribed Percocet, Ibuprofen and Colace.  She will follow up in the clinic on 03/04/2017 for postoperative evaluation.   Jaynie CollinsUGONNA  Jarod Bozzo, MD, FACOG Attending Obstetrician & Gynecologist Faculty Practice, [facility] [place]

## 2017-02-18 NOTE — MAU Note (Signed)
Pt to OR.

## 2017-02-18 NOTE — Transfer of Care (Signed)
Immediate Anesthesia Transfer of Care Note  Patient: Julia Armstrong  Procedure(s) Performed: Procedure(s): DILATATION AND EVACUATION (N/A)  Patient Location: PACU  Anesthesia Type:MAC  Level of Consciousness: awake, alert  and oriented  Airway & Oxygen Therapy: Patient Spontanous Breathing and Patient connected to nasal cannula oxygen  Post-op Assessment: Report given to RN, Post -op Vital signs reviewed and stable and Patient moving all extremities  Post vital signs: Reviewed and stable  Last Vitals:  Vitals:   02/18/17 1254 02/18/17 1257  BP: (!) 133/91 (!) 133/94  Pulse: 91 93  Resp:    Temp:      Last Pain:  Vitals:   02/18/17 1149  PainSc: 7          Complications: No apparent anesthesia complications

## 2017-02-18 NOTE — Discharge Instructions (Signed)
DISCHARGE INSTRUCTIONS: D&C / D&E °The following instructions have been prepared to help you care for yourself upon your return home. °  °Personal hygiene: °• Use sanitary pads for vaginal drainage, not tampons. °• Shower the day after your procedure. °• NO tub baths, pools or Jacuzzis for 2-3 weeks. °• Wipe front to back after using the bathroom. ° °Activity and limitations: °• Do NOT drive or operate any equipment for 24 hours. The effects of anesthesia are still present and drowsiness may result. °• Do NOT rest in bed all day. °• Walking is encouraged. °• Walk up and down stairs slowly. °• You may resume your normal activity in one to two days or as indicated by your physician. ° °Sexual activity: NO intercourse for at least 2 weeks after the procedure, or as indicated by your physician. ° °Diet: Eat a light meal as desired this evening. You may resume your usual diet tomorrow. ° °Return to work: You may resume your work activities in one to two days or as indicated by your doctor. ° °What to expect after your surgery: Expect to have vaginal bleeding/discharge for 2-3 days and spotting for up to 10 days. It is not unusual to have soreness for up to 1-2 weeks. You may have a slight burning sensation when you urinate for the first day. Mild cramps may continue for a couple of days. You may have a regular period in 2-6 weeks. ° °Call your doctor for any of the following: °• Excessive vaginal bleeding, saturating and changing one pad every hour. °• Inability to urinate 6 hours after discharge from hospital. °• Pain not relieved by pain medication. °• Fever of 100.4° F or greater. °• Unusual vaginal discharge or odor. ° ° Call for an appointment:  ° ° °Patient’s signature: ______________________ ° °Nurse’s signature ________________________ ° °Support person's signature_______________________ ° ° °DISCHARGE INSTRUCTIONS: D&C / D&E °The following instructions have been prepared to help you care for yourself upon your  return home. °  °Personal hygiene: °• Use sanitary pads for vaginal drainage, not tampons. °• Shower the day after your procedure. °• NO tub baths, pools or Jacuzzis for 2-3 weeks. °• Wipe front to back after using the bathroom. ° °Activity and limitations: °• Do NOT drive or operate any equipment for 24 hours. The effects of anesthesia are still present and drowsiness may result. °• Do NOT rest in bed all day. °• Walking is encouraged. °• Walk up and down stairs slowly. °• You may resume your normal activity in one to two days or as indicated by your physician. ° °Sexual activity: NO intercourse for at least 2 weeks after the procedure, or as indicated by your physician. ° °Diet: Eat a light meal as desired this evening. You may resume your usual diet tomorrow. ° °Return to work: You may resume your work activities in one to two days or as indicated by your doctor. ° °What to expect after your surgery: Expect to have vaginal bleeding/discharge for 2-3 days and spotting for up to 10 days. It is not unusual to have soreness for up to 1-2 weeks. You may have a slight burning sensation when you urinate for the first day. Mild cramps may continue for a couple of days. You may have a regular period in 2-6 weeks. ° °Call your doctor for any of the following: °• Excessive vaginal bleeding, saturating and changing one pad every hour. °• Inability to urinate 6 hours after discharge from hospital. °• Pain not relieved by   pain medication.  Fever of 100.4 F or greater.  Unusual vaginal discharge or odor.   Call for an appointment:    Patients signature: ______________________  Nurses signature ________________________  Support person's signature_______________________    Post Anesthesia Home Care Instructions  Activity: Get plenty of rest for the remainder of the day. A responsible individual must stay with you for 24 hours following the procedure.  For the next 24 hours, DO NOT: -Drive a car -Social workerperate  machinery -Drink alcoholic beverages -Take any medication unless instructed by your physician -Make any legal decisions or sign important papers.  Meals: Start with liquid foods such as gelatin or soup. Progress to regular foods as tolerated. Avoid greasy, spicy, heavy foods. If nausea and/or vomiting occur, drink only clear liquids until the nausea and/or vomiting subsides. Call your physician if vomiting continues.  Special Instructions/Symptoms: Your throat may feel dry or sore from the anesthesia or the breathing tube placed in your throat during surgery. If this causes discomfort, gargle with warm salt water. The discomfort should disappear within 24 hours.  If you had a scopolamine patch placed behind your ear for the management of post- operative nausea and/or vomiting:  1. The medication in the patch is effective for 72 hours, after which it should be removed.  Wrap patch in a tissue and discard in the trash. Wash hands thoroughly with soap and water. 2. You may remove the patch earlier than 72 hours if you experience unpleasant side effects which may include dry mouth, dizziness or visual disturbances. 3. Avoid touching the patch. Wash your hands with soap and water after contact with the patch.    Post Anesthesia Home Care Instructions  Activity: Get plenty of rest for the remainder of the day. A responsible individual must stay with you for 24 hours following the procedure.  For the next 24 hours, DO NOT: -Drive a car -Advertising copywriterperate machinery -Drink alcoholic beverages -Take any medication unless instructed by your physician -Make any legal decisions or sign important papers.  Meals: Start with liquid foods such as gelatin or soup. Progress to regular foods as tolerated. Avoid greasy, spicy, heavy foods. If nausea and/or vomiting occur, drink only clear liquids until the nausea and/or vomiting subsides. Call your physician if vomiting continues.  Special  Instructions/Symptoms: Your throat may feel dry or sore from the anesthesia or the breathing tube placed in your throat during surgery. If this causes discomfort, gargle with warm salt water. The discomfort should disappear within 24 hours.  If you had a scopolamine patch placed behind your ear for the management of post- operative nausea and/or vomiting:  1. The medication in the patch is effective for 72 hours, after which it should be removed.  Wrap patch in a tissue and discard in the trash. Wash hands thoroughly with soap and water. 2. You may remove the patch earlier than 72 hours if you experience unpleasant side effects which may include dry mouth, dizziness or visual disturbances. 3. Avoid touching the patch. Wash your hands with soap and water after contact with the patch.   Dilation and Curettage or Vacuum Curettage, Care After This sheet gives you information about how to care for yourself after your procedure. Your health care provider may also give you more specific instructions. If you have problems or questions, contact your health care provider. What can I expect after the procedure? After your procedure, it is common to have:  Mild pain or cramping.  Some vaginal bleeding or spotting.  These may last for up to 2 weeks after your procedure. Follow these instructions at home: Activity   Do not drive or use heavy machinery while taking prescription pain medicine.  Avoid driving for the first 24 hours after your procedure.  Take frequent, short walks, followed by rest periods, throughout the day. Ask your health care provider what activities are safe for you. After 1-2 days, you may be able to return to your normal activities.  Do not lift anything heavier than 10 lb (4.5 kg) until your health care provider approves.  For at least 2 weeks, or as long as told by your health care provider, do not: ? Douche. ? Use tampons. ? Have sexual intercourse. General  instructions   Take over-the-counter and prescription medicines only as told by your health care provider. This is especially important if you take blood thinning medicine.  Do not take baths, swim, or use a hot tub until your health care provider approves. Take showers instead of baths.  Wear compression stockings as told by your health care provider. These stockings help to prevent blood clots and reduce swelling in your legs.  It is your responsibility to get the results of your procedure. Ask your health care provider, or the department performing the procedure, when your results will be ready.  Keep all follow-up visits as told by your health care provider. This is important. Contact a health care provider if:  You have severe cramps that get worse or that do not get better with medicine.  You have severe abdominal pain.  You cannot drink fluids without vomiting.  You develop pain in a different area of your pelvis.  You have bad-smelling vaginal discharge.  You have a rash. Get help right away if:  You have vaginal bleeding that soaks more than one sanitary pad in 1 hour, for 2 hours in a row.  You pass large blood clots from your vagina.  You have a fever that is above 100.39F (38.0C).  Your abdomen feels very tender or hard.  You have chest pain.  You have shortness of breath.  You cough up blood.  You feel dizzy or light-headed.  You faint.  You have pain in your neck or shoulder area. This information is not intended to replace advice given to you by your health care provider. Make sure you discuss any questions you have with your health care provider. Document Released: 07/11/2000 Document Revised: 03/12/2016 Document Reviewed: 02/14/2016 Elsevier Interactive Patient Education  [employer]2018 Elsevier Inc[employer]

## 2017-02-18 NOTE — MAU Note (Signed)
Pt presents to MAU from clinic for follow up from a miscarriage. Pt had labs drawn on July the 18th and continues to have pain on the  right side of her abdomen

## 2017-02-18 NOTE — Anesthesia Postprocedure Evaluation (Signed)
Anesthesia Post Note  Patient: Julia Armstrong  Procedure(s) Performed: Procedure(s) (LRB): DILATATION AND EVACUATION (N/A)     Patient location during evaluation: PACU Anesthesia Type: MAC Level of consciousness: awake and alert Pain management: pain level controlled Vital Signs Assessment: post-procedure vital signs reviewed and stable Respiratory status: spontaneous breathing, nonlabored ventilation, respiratory function stable and patient connected to nasal cannula oxygen Cardiovascular status: stable and blood pressure returned to baseline Anesthetic complications: no Comments: Complaining of mild LLQ pain. OB to see prior to discharge.     Last Vitals:  Vitals:   02/18/17 1615 02/18/17 1630  BP:    Pulse:    Resp: 20 20  Temp:  36.7 C    Last Pain:  Vitals:   02/18/17 1630  PainSc: Asleep   Pain Goal: Patients Stated Pain Goal: 2 (02/18/17 1612)               Effie Berkshire

## 2017-02-19 ENCOUNTER — Encounter (HOSPITAL_COMMUNITY): Payer: Self-pay | Admitting: Obstetrics & Gynecology

## 2017-02-19 LAB — GC/CHLAMYDIA PROBE AMP (~~LOC~~) NOT AT ARMC
Chlamydia: NEGATIVE
NEISSERIA GONORRHEA: NEGATIVE

## 2017-02-20 NOTE — Discharge Instructions (Signed)
[facility]

## 2017-02-22 ENCOUNTER — Inpatient Hospital Stay (HOSPITAL_COMMUNITY)
Admission: AD | Admit: 2017-02-22 | Discharge: 2017-02-22 | Disposition: A | Discharge status: Home / Self Care | Payer: Medicaid Other | Source: Ambulatory Visit | Attending: Obstetrics and Gynecology | Admitting: Obstetrics and Gynecology

## 2017-02-22 ENCOUNTER — Encounter (HOSPITAL_COMMUNITY): Payer: Self-pay

## 2017-02-22 DIAGNOSIS — N719 Inflammatory disease of uterus, unspecified: Secondary | ICD-10-CM | POA: Insufficient documentation

## 2017-02-22 DIAGNOSIS — Z9889 Other specified postprocedural states: Secondary | ICD-10-CM | POA: Insufficient documentation

## 2017-02-22 DIAGNOSIS — R109 Unspecified abdominal pain: Secondary | ICD-10-CM | POA: Diagnosis present

## 2017-02-22 LAB — URINALYSIS, ROUTINE W REFLEX MICROSCOPIC
Bilirubin Urine: NEGATIVE
GLUCOSE, UA: NEGATIVE mg/dL
Hgb urine dipstick: NEGATIVE
KETONES UR: NEGATIVE mg/dL
LEUKOCYTES UA: NEGATIVE
NITRITE: NEGATIVE
PROTEIN: NEGATIVE mg/dL
Specific Gravity, Urine: 1.02 (ref 1.005–1.030)
pH: 5 (ref 5.0–8.0)

## 2017-02-22 LAB — RAPID URINE DRUG SCREEN, HOSP PERFORMED
Amphetamines: NOT DETECTED
Barbiturates: NOT DETECTED
Benzodiazepines: NOT DETECTED
Cocaine: POSITIVE — AB
Opiates: NOT DETECTED
Tetrahydrocannabinol: POSITIVE — AB

## 2017-02-22 LAB — WET PREP, GENITAL
Clue Cells Wet Prep HPF POC: NONE SEEN
Sperm: NONE SEEN
TRICH WET PREP: NONE SEEN
YEAST WET PREP: NONE SEEN

## 2017-02-22 LAB — POCT PREGNANCY, URINE: Preg Test, Ur: POSITIVE — AB

## 2017-02-22 MED ORDER — CEPHALEXIN 500 MG PO CAPS: 500.0000 mg | ORAL_CAPSULE | Freq: Four times a day (QID) | ORAL | 0 refills | Status: DC

## 2017-02-22 MED ORDER — OXYCODONE-ACETAMINOPHEN 5-325 MG PO TABS: 2.0000 | ORAL_TABLET | ORAL | 0 refills | Status: DC | PRN

## 2017-02-22 NOTE — MAU Note (Signed)
Had D&C on 02/18/17 having abdominal pain and vaginal odor since.

## 2017-02-22 NOTE — MAU Provider Note (Signed)
History   pt is 3 days S/P D and C for retained products. In with abd pain and tenderness also vaginal odor noted. Started feeling really bad yesterday and worse today.  CSN: [id number]  Arrival date & time 02/22/17  1306   None     Chief Complaint  Patient presents with  . Abdominal Pain  . Vaginal Discharge    HPI  Past Medical History:  Diagnosis Date  . Gallstones     Past Surgical History:  Procedure Laterality Date  . CESAREAN SECTION    . DILATION AND EVACUATION N/A 02/18/2017   Procedure: DILATATION AND EVACUATION;  Surgeon: Tereso NewcomerAnyanwu, Ugonna A, MD;  Location: [facility];  Service: Gynecology;  Laterality: N/A;    Family History  Problem Relation Age of Onset  . Cancer Other     Social History  Substance Use Topics  . Smoking status: Never Smoker  . Smokeless tobacco: Never Used  . Alcohol use No    OB History    Gravida Para Term Preterm AB Living   5 3 3   2 3    SAB TAB Ectopic Multiple Live Births   2       3      Review of Systems  Constitutional: Negative.   HENT: Negative.   Eyes: Negative.   Respiratory: Negative.   Cardiovascular: Negative.   Gastrointestinal: Positive for abdominal pain.  Endocrine: Negative.   Genitourinary: Positive for vaginal bleeding and vaginal discharge.  Musculoskeletal: Negative.   Skin: Negative.   Allergic/Immunologic: Negative.   Neurological: Negative.   Hematological: Negative.   Psychiatric/Behavioral: Negative.     Allergies  Patient has no known allergies.  Home Medications    BP 131/82 (BP Location: Left Arm)   Pulse 91   Temp 98.3 F (36.8 C)   Resp 18   Ht 5\' 7"  (1.702 m)   Wt 212 lb (96.2 kg)   BMI 33.20 kg/m   Physical Exam  Constitutional: She is oriented to person, place, and time. She appears well-developed and well-nourished.  HENT:  Head: Normocephalic.  Eyes: Pupils are equal, round, and reactive to light.  Neck: Normal range of motion.  Cardiovascular: Normal rate, regular  rhythm, normal heart sounds and intact distal pulses.   Pulmonary/Chest: Effort normal and breath sounds normal.  Abdominal: Soft. Bowel sounds are normal.  Genitourinary: Vaginal discharge found.  Musculoskeletal: Normal range of motion.  Neurological: She is alert and oriented to person, place, and time. She has normal reflexes.  Skin: Skin is warm and dry.  Psychiatric: She has a normal mood and affect. Her behavior is normal. Judgment and thought content normal.    MAU Course  Procedures (including critical care time)  Labs Reviewed  URINALYSIS, ROUTINE W REFLEX MICROSCOPIC - Abnormal; Notable for the following:       Result Value   APPearance HAZY (*)    All other components within normal limits  POCT PREGNANCY, URINE - Abnormal; Notable for the following:    Preg Test, Ur POSITIVE (*)    All other components within normal limits  WET PREP, GENITAL  RAPID URINE DRUG SCREEN, HOSP PERFORMED   No results found.  Dx: endometritis   MDM  Sterile spec done wet prep obtained. culturesz done prior to her surgery and she has not had sex since procedure. Pos CMT, sl abd tenderness. Having scant amt dark vag bleeding. Will treat empirically with keflex and d/c home stable

## 2017-02-22 NOTE — Discharge Instructions (Signed)
Endometritis °Endometritis is irritation, soreness, or inflammation that affects the lining of the uterus (endometrium). °Infection is usually the cause of endometritis. It is important to get treatment to prevent complications. Common complications may include more severe infections and not being able to have children(infertility). °What are the causes? °This condition may be caused by: °· Bacterial infections. °· STIs (sexually transmitted infections). °· A miscarriage or childbirth, especially after a long labor or cesarean delivery. °· Certain gynecological procedures. These may include dilation and curettage (D&C), hysteroscopy, or birth control (contraceptive) insertion. °· Tuberculosis (TB). ° °What are the signs or symptoms? °Symptoms of this condition include: °· Fever. °· Lower abdomen (abdominal) pain. °· Pelvis (pelvic) pain. °· Abnormal vaginal discharge or bleeding. °· Abdominal bloating (distention) or swelling. °· General discomfort or generally feeling ill. °· Discomfort with bowel movements. °· Constipation. ° °How is this diagnosed? °This condition may be diagnosed based on: °· A physical exam, including a pelvic exam. °· Tests, such as: °? Blood tests. °? Removal of a sample of endometrial tissue for testing (endometrial biopsy). °? Examining a sample of vaginal discharge under a microscope (wet prep). °? Removal of a sample of fluid from the cervix for testing (cervical culture). °? Surgical examination of the pelvis and abdomen. ° °How is this treated? °This condition is treated with: °· Antibiotic medicines. °· For more severe cases, hospitalization may be needed to give fluids and antibiotics directly into a vein through an IV tube. ° °Follow these instructions at home: °· Take over-the-counter and prescription medicines only as told by your health care provider. °· Drink enough fluid to keep your urine clear or pale yellow. °· Take your antibiotic medicine as told by your health care  provider. Do not stop taking the antibiotic even if you start to feel better. °· Do not douche or have sex (including vaginal, oral, and anal sex) until your health care provider approves. °· If your endometritis was caused by an STI, do not have sex (including vaginal, oral, and anal sex) until your partner has also been treated for the STI. °· Return to your normal activities as told by your health care provider. Ask your health care provider what activities are safe for you. °· Keep all follow-up visits as told by your health care provider. This is important. °Contact a health care provider if: °· You have pain that does not get better with medicine. °· You have a fever. °· You have pain with bowel movements. °Get help right away if: °· You have abdominal swelling. °· You have abdominal pain that gets worse. °· You have bad-smelling vaginal discharge, or an increased amount of vaginal discharge. °· You have abnormal vaginal bleeding. °· You have nausea and vomiting. °Summary °· Endometritis affects the lining of the uterus (endometrium) and is usually caused by an infection. °· It is important to get treatment to prevent complications. °· You have several treatment options for endometritis. Treatment may include antibiotics and IV fluids. °· Take your antibiotic medicine as told by your health care provider. Do not stop taking the antibiotic even if you start to feel better. °· Do not douche or have sex (including vaginal, oral, and anal sex) until your health care provider approves. °This information is not intended to replace advice given to you by your health care provider. Make sure you discuss any questions you have with your health care provider. °Document Released: 07/08/2001 Document Revised: 07/29/2016 Document Reviewed: 07/29/2016 °Elsevier Interactive Patient Education © 2017   Elsevier Inc. ° °

## 2017-02-26 ENCOUNTER — Ambulatory Visit: Payer: Medicaid Other | Admitting: Lab

## 2017-02-26 ENCOUNTER — Encounter (HOSPITAL_COMMUNITY): Payer: Self-pay | Admitting: *Deleted

## 2017-02-26 ENCOUNTER — Inpatient Hospital Stay (HOSPITAL_COMMUNITY)
Admission: AD | Admit: 2017-02-26 | Discharge: 2017-02-26 | Disposition: A | Discharge status: Home / Self Care | Payer: Medicaid Other | Source: Ambulatory Visit | Attending: Obstetrics and Gynecology | Admitting: Obstetrics and Gynecology

## 2017-02-26 DIAGNOSIS — G8918 Other acute postprocedural pain: Secondary | ICD-10-CM

## 2017-02-26 DIAGNOSIS — O26891 Other specified pregnancy related conditions, first trimester: Secondary | ICD-10-CM | POA: Diagnosis not present

## 2017-02-26 DIAGNOSIS — N719 Inflammatory disease of uterus, unspecified: Secondary | ICD-10-CM

## 2017-02-26 DIAGNOSIS — Z3A00 Weeks of gestation of pregnancy not specified: Secondary | ICD-10-CM | POA: Diagnosis not present

## 2017-02-26 DIAGNOSIS — R1030 Lower abdominal pain, unspecified: Secondary | ICD-10-CM | POA: Diagnosis present

## 2017-02-26 DIAGNOSIS — O0339 Incomplete spontaneous abortion with other complications: Secondary | ICD-10-CM

## 2017-02-26 DIAGNOSIS — R109 Unspecified abdominal pain: Secondary | ICD-10-CM

## 2017-02-26 DIAGNOSIS — O26899 Other specified pregnancy related conditions, unspecified trimester: Secondary | ICD-10-CM

## 2017-02-26 LAB — CBC
HEMATOCRIT: 37.6 % (ref 36.0–46.0)
Hemoglobin: 12.1 g/dL (ref 12.0–15.0)
MCH: 24.6 pg — AB (ref 26.0–34.0)
MCHC: 32.2 g/dL (ref 30.0–36.0)
MCV: 76.4 fL — AB (ref 78.0–100.0)
PLATELETS: 274 10*3/uL (ref 150–400)
RBC: 4.92 MIL/uL (ref 3.87–5.11)
RDW: 16.3 % — ABNORMAL HIGH (ref 11.5–15.5)
WBC: 6.9 10*3/uL (ref 4.0–10.5)

## 2017-02-26 LAB — URINALYSIS, ROUTINE W REFLEX MICROSCOPIC
BACTERIA UA: NONE SEEN
Bilirubin Urine: NEGATIVE
Glucose, UA: NEGATIVE mg/dL
KETONES UR: NEGATIVE mg/dL
LEUKOCYTES UA: NEGATIVE
NITRITE: NEGATIVE
Protein, ur: NEGATIVE mg/dL
Specific Gravity, Urine: 1.017 (ref 1.005–1.030)
pH: 6 (ref 5.0–8.0)

## 2017-02-26 LAB — HCG, QUANTITATIVE, PREGNANCY: hCG, Beta Chain, Quant, S: 28 m[IU]/mL — ABNORMAL HIGH (ref <5)

## 2017-02-26 LAB — POCT PREGNANCY, URINE: Preg Test, Ur: POSITIVE — AB

## 2017-02-26 MED ORDER — AMOXICILLIN-POT CLAVULANATE 875-125 MG PO TABS: 1.0000 | ORAL_TABLET | Freq: Two times a day (BID) | ORAL | 0 refills | Status: DC

## 2017-02-26 MED ORDER — KETOROLAC TROMETHAMINE 60 MG/2ML IM SOLN
60.0000 mg | Freq: Once | INTRAMUSCULAR | Status: AC
  Administered 2017-02-26: 60 mg via INTRAMUSCULAR
  Filled 2017-02-26: qty 2

## 2017-02-26 MED ORDER — OXYCODONE-ACETAMINOPHEN 5-325 MG PO TABS: 1.0000 | ORAL_TABLET | Freq: Four times a day (QID) | ORAL | 0 refills | Status: DC | PRN

## 2017-02-26 NOTE — Progress Notes (Signed)
Patient here today due to severe pain on right side and in back at a level 8, also some cramping in abdominal area.Patient stated she had a miscarriage 01/26/2017 and she had a D/C on 02/18/2017. Advised patient to make a visit to see a doctor, but she decided to go up stairs to MAU to see a doctor today.

## 2017-02-26 NOTE — MAU Provider Note (Signed)
Chief Complaint:  Abdominal Pain and Back Pain   First Provider Initiated Contact with Patient 02/26/17 1316      HPI: Cecilie KicksDestiny Golab is a 26 y.o. [id number] who presents to maternity admissions reporting upper and lower abdominal pain, pain in right side and pain in her back that shoots up to her neck 4 times a day.  Had a D&E on 02/18/17 for retained POCs and then was seen here on 02/22/17 for pain.  Bleeding has come and gone but has never been very heavy.  Was treated with Keflex on MAU visit.   She reports vaginal bleeding, but novaginal itching/burning, urinary symptoms, h/a, dizziness, n/v, or fever/chills.  Does state vaginal discharge has an odor.  Abdominal Pain  This is a recurrent problem. The current episode started in the past 7 days. The onset quality is gradual. The problem occurs intermittently. The problem has been unchanged. The pain is located in the generalized abdominal region and suprapubic region. The pain is moderate. The quality of the pain is cramping. The abdominal pain radiates to the back. Pertinent negatives include no anorexia, constipation, diarrhea, dysuria, fever, frequency, headaches or myalgias. Nothing aggravates the pain. The pain is relieved by nothing. Treatments tried: Motrin and Percocet this morning. There is no history of abdominal surgery (but had D&E).  Back Pain  This is a recurrent problem. The current episode started in the past 7 days. The problem occurs constantly. The pain is present in the lumbar spine. The quality of the pain is described as cramping and stabbing. Radiates to: to upper back. The pain is moderate. The pain is the same all the time. The symptoms are aggravated by bending, twisting and position. Associated symptoms include abdominal pain and pelvic pain. Pertinent negatives include no dysuria, fever, headaches, numbness, paresis, paresthesias, tingling or weakness. She has tried analgesics for the symptoms. The treatment provided mild  relief.   RN note: Surgery on 7/25 +Lower right back pain States radiates up back  Rating pain 7/10 Took oxycodone and ibuprofen at 6am  +Lower abdominal pain Cramping Rating pain 7/10  +vaginal bleeding started last night  "a little this am" Manson PasseyBrown in [employer]  Past Medical History: Past Medical History:  Diagnosis Date  . Gallstones     Past obstetric history: OB History  Gravida Para Term Preterm AB Living  5 3 3   2 3   SAB TAB Ectopic Multiple Live Births  2       3    # Outcome Date GA Lbr Len/2nd Weight Sex Delivery Anes PTL Lv  5 SAB           4 Term      CS-LTranv     3 Term      CS-LTranv     2 Term      CS-LTranv     1 SAB               Past Surgical History: Past Surgical History:  Procedure Laterality Date  . CESAREAN SECTION    . DILATION AND EVACUATION N/A 02/18/2017   Procedure: DILATATION AND EVACUATION;  Surgeon: Tereso NewcomerAnyanwu, Ugonna A, MD;  Location: [facility];  Service: Gynecology;  Laterality: N/A;    Family History: Family History  Problem Relation Age of Onset  . Cancer Other     Social History: Social History  Substance Use Topics  . Smoking status: Never Smoker  . Smokeless tobacco: Never Used  . Alcohol use No  Allergies: No Known Allergies  Meds:  Prescriptions Prior to Admission  Medication Sig Dispense Refill Last Dose  . cephALEXin (KEFLEX) 500 MG capsule Take 1 capsule (500 mg total) by mouth 4 (four) times daily. 40 capsule 0 02/25/2017 at Unknown time  . ibuprofen (ADVIL,MOTRIN) 600 MG tablet Take 1 tablet (600 mg total) by mouth every 6 (six) hours as needed for moderate pain. 30 tablet 2 02/26/2017 at Unknown time  . oxyCODONE-acetaminophen (PERCOCET/ROXICET) 5-325 MG tablet Take 2 tablets by mouth every 4 (four) hours as needed for severe pain. 15 tablet 0 02/26/2017 at Unknown time  . Prenatal Vit-Fe Fumarate-FA (PNV PRENATAL PLUS MULTIVITAMIN) 27-1 MG TABS Take 1 tablet by mouth daily.  1 02/25/2017 at Unknown time  .  diclofenac (VOLTAREN) 75 MG EC tablet Take 1 tablet by mouth 2 (two) times daily as needed for mild pain (swelling).   2 prn  . docusate sodium (COLACE) 100 MG capsule Take 1 capsule (100 mg total) by mouth 2 (two) times daily as needed. (Patient not taking: Reported on 02/26/2017) 30 capsule 2 Not Taking at Unknown time  . famotidine (PEPCID) 20 MG tablet Take 20 mg by mouth 2 (two) times daily as needed for heartburn or indigestion.   0 prn    I have reviewed patient's Past Medical Hx, Surgical Hx, Family Hx, Social Hx, medications and allergies.  ROS:  Review of Systems  Constitutional: Negative for fever.  Gastrointestinal: Positive for abdominal pain. Negative for anorexia, constipation and diarrhea.  Genitourinary: Positive for pelvic pain. Negative for dysuria and frequency.  Musculoskeletal: Positive for back pain. Negative for myalgias.  Neurological: Negative for tingling, weakness, numbness, headaches and paresthesias.   Other systems negative     Physical Exam  Patient Vitals for the past 24 hrs:  BP Temp Temp src Pulse Resp SpO2 Weight  02/26/17 1026 121/77 97.6 F (36.4 C) Oral 74 18 100 % 208 lb 0.6 oz (94.4 kg)   Constitutional: Well-developed, well-nourished female in no acute distress.  Cardiovascular: normal rate and rhythm, no ectopy audible, S1 & S2 heard, no murmur Respiratory: normal effort, no distress. Lungs CTAB with no wheezes or crackles GI: Abd soft, non-tender.  Nondistended.  No rebound, No guarding.  Bowel Sounds audible  MS: Extremities nontender, no edema, normal ROM   Tender paraspinal muscles in midback, MAEW, no obvious motor limitations Neurologic: Alert and oriented x 4.   Grossly nonfocal. GU: Neg CVAT. Skin:  Warm and Dry Psych:  Affect appropriate.  PELVIC EXAM: Bimanual exam: Cervix firm, anterior, Positive CMT, uterus tender,  adnexa without enlargement, or mass    Labs: Results for orders placed or performed during the hospital  encounter of 02/26/17 (from the past 24 hour(s))  Urinalysis, Routine w reflex microscopic     Status: Abnormal   Collection Time: 02/26/17 10:29 AM  Result Value Ref Range   Color, Urine YELLOW YELLOW   APPearance CLEAR CLEAR   Specific Gravity, Urine 1.017 1.005 - 1.030   pH 6.0 5.0 - 8.0   Glucose, UA NEGATIVE NEGATIVE mg/dL   Hgb urine dipstick MODERATE (A) NEGATIVE   Bilirubin Urine NEGATIVE NEGATIVE   Ketones, ur NEGATIVE NEGATIVE mg/dL   Protein, ur NEGATIVE NEGATIVE mg/dL   Nitrite NEGATIVE NEGATIVE   Leukocytes, UA NEGATIVE NEGATIVE   RBC / HPF 0-5 0 - 5 RBC/hpf   WBC, UA 0-5 0 - 5 WBC/hpf   Bacteria, UA NONE SEEN NONE SEEN   Squamous Epithelial / LPF  0-5 (A) NONE SEEN  Pregnancy, urine POC     Status: Abnormal   Collection Time: 02/26/17 10:43 AM  Result Value Ref Range   Preg Test, Ur POSITIVE (A) NEGATIVE  CBC     Status: Abnormal   Collection Time: 02/26/17 12:49 PM  Result Value Ref Range   WBC 6.9 4.0 - 10.5 K/uL   RBC 4.92 3.87 - 5.11 MIL/uL   Hemoglobin 12.1 12.0 - 15.0 g/dL   HCT 16.137.6 09.636.0 - 04.546.0 %   MCV 76.4 (L) 78.0 - 100.0 fL   MCH 24.6 (L) 26.0 - 34.0 pg   MCHC 32.2 30.0 - 36.0 g/dL   RDW 40.916.3 (H) 81.111.5 - 91.415.5 %   Platelets 274 150 - 400 K/uL   HCG = 28  --/--/AB POS (07/02 0120)  Imaging:    MAU Course/MDM: I have ordered labs as follows:  CBC and UA Imaging ordered: outpatient US (pt could not stay for inpatient US) Results reviewed.   Consult Dr Vergie LivingPickens who recommended CBC and US. Pt could not stay for US, agrees to do it outpatient. Will change antibiotic to Augmentin from Keflex and give her more Percocet  Toradol given for pain..   WBC not elevated   Urine clear  Pt stable at time of discharge.  Assessment: 1. Abdominal pain in pregnancy, antepartum   2. Abdominal pain in pregnancy, antepartum   3.  Probable postoperative endometritis  Plan: Discharge home Recommend Followup as scheduled 8/8 with Dr Macon LargeAnyanwu.  Will schedule  outpatient US.  Rx sent for Augmentin 875mg  bid  for endometritis Rx given for Percocet Continue ibuprofen  Encouraged to return here or to other Urgent Care/ED if she develops worsening of symptoms, increase in pain, fever, or other concerning symptoms.   Wynelle BourgeoisMarie Marquasha Brutus CNM, MSN Certified Nurse-Midwife 02/26/2017 1:16 PM

## 2017-02-26 NOTE — Discharge Instructions (Signed)
Endometritis °Endometritis is irritation, soreness, or inflammation that affects the lining of the uterus (endometrium). °Infection is usually the cause of endometritis. It is important to get treatment to prevent complications. Common complications may include more severe infections and not being able to have children(infertility). °What are the causes? °This condition may be caused by: °· Bacterial infections. °· STIs (sexually transmitted infections). °· A miscarriage or childbirth, especially after a long labor or cesarean delivery. °· Certain gynecological procedures. These may include dilation and curettage (D&C), hysteroscopy, or birth control (contraceptive) insertion. °· Tuberculosis (TB). ° °What are the signs or symptoms? °Symptoms of this condition include: °· Fever. °· Lower abdomen (abdominal) pain. °· Pelvis (pelvic) pain. °· Abnormal vaginal discharge or bleeding. °· Abdominal bloating (distention) or swelling. °· General discomfort or generally feeling ill. °· Discomfort with bowel movements. °· Constipation. ° °How is this diagnosed? °This condition may be diagnosed based on: °· A physical exam, including a pelvic exam. °· Tests, such as: °? Blood tests. °? Removal of a sample of endometrial tissue for testing (endometrial biopsy). °? Examining a sample of vaginal discharge under a microscope (wet prep). °? Removal of a sample of fluid from the cervix for testing (cervical culture). °? Surgical examination of the pelvis and abdomen. ° °How is this treated? °This condition is treated with: °· Antibiotic medicines. °· For more severe cases, hospitalization may be needed to give fluids and antibiotics directly into a vein through an IV tube. ° °Follow these instructions at home: °· Take over-the-counter and prescription medicines only as told by your health care provider. °· Drink enough fluid to keep your urine clear or pale yellow. °· Take your antibiotic medicine as told by your health care  provider. Do not stop taking the antibiotic even if you start to feel better. °· Do not douche or have sex (including vaginal, oral, and anal sex) until your health care provider approves. °· If your endometritis was caused by an STI, do not have sex (including vaginal, oral, and anal sex) until your partner has also been treated for the STI. °· Return to your normal activities as told by your health care provider. Ask your health care provider what activities are safe for you. °· Keep all follow-up visits as told by your health care provider. This is important. °Contact a health care provider if: °· You have pain that does not get better with medicine. °· You have a fever. °· You have pain with bowel movements. °Get help right away if: °· You have abdominal swelling. °· You have abdominal pain that gets worse. °· You have bad-smelling vaginal discharge, or an increased amount of vaginal discharge. °· You have abnormal vaginal bleeding. °· You have nausea and vomiting. °Summary °· Endometritis affects the lining of the uterus (endometrium) and is usually caused by an infection. °· It is important to get treatment to prevent complications. °· You have several treatment options for endometritis. Treatment may include antibiotics and IV fluids. °· Take your antibiotic medicine as told by your health care provider. Do not stop taking the antibiotic even if you start to feel better. °· Do not douche or have sex (including vaginal, oral, and anal sex) until your health care provider approves. °This information is not intended to replace advice given to you by your health care provider. Make sure you discuss any questions you have with your health care provider. °Document Released: 07/08/2001 Document Revised: 07/29/2016 Document Reviewed: 07/29/2016 °Elsevier Interactive Patient Education © 2017   Elsevier Inc. ° °

## 2017-02-26 NOTE — MAU Note (Addendum)
Surgery on 7/25 +Lower right back pain States radiates up back  Rating pain 7/10 Took oxycodone and ibuprofen at 6am  +Lower abdominal pain Cramping Rating pain 7/10  +vaginal bleeding started last night  "a little this am" Brown in color

## 2017-02-27 DIAGNOSIS — O0339 Incomplete spontaneous abortion with other complications: Secondary | ICD-10-CM | POA: Insufficient documentation

## 2017-03-03 ENCOUNTER — Ambulatory Visit (HOSPITAL_COMMUNITY): Payer: Medicaid Other

## 2017-03-04 ENCOUNTER — Encounter: Payer: Self-pay | Admitting: Obstetrics & Gynecology

## 2017-03-04 ENCOUNTER — Ambulatory Visit (INDEPENDENT_AMBULATORY_CARE_PROVIDER_SITE_OTHER): Payer: Medicaid Other | Admitting: Obstetrics & Gynecology

## 2017-03-04 VITALS — BP 128/83 | HR 78 | Ht 67.0 in | Wt 208.0 lb

## 2017-03-04 DIAGNOSIS — G8918 Other acute postprocedural pain: Secondary | ICD-10-CM

## 2017-03-04 DIAGNOSIS — M545 Low back pain: Secondary | ICD-10-CM

## 2017-03-04 DIAGNOSIS — Z09 Encounter for follow-up examination after completed treatment for conditions other than malignant neoplasm: Secondary | ICD-10-CM

## 2017-03-04 DIAGNOSIS — R109 Unspecified abdominal pain: Secondary | ICD-10-CM

## 2017-03-04 DIAGNOSIS — Z9889 Other specified postprocedural states: Secondary | ICD-10-CM

## 2017-03-05 ENCOUNTER — Encounter: Payer: Self-pay | Admitting: General Practice

## 2017-03-05 ENCOUNTER — Telehealth: Payer: Self-pay | Admitting: General Practice

## 2017-03-05 MED ORDER — OXYCODONE-ACETAMINOPHEN 5-325 MG PO TABS: 1.0000 | ORAL_TABLET | Freq: Four times a day (QID) | ORAL | 0 refills | Status: DC | PRN

## 2017-03-05 MED ORDER — IBUPROFEN 800 MG PO TABS: 800.0000 mg | ORAL_TABLET | Freq: Three times a day (TID) | ORAL | 3 refills | Status: DC | PRN

## 2017-03-05 MED ORDER — CYCLOBENZAPRINE HCL 10 MG PO TABS: 10.0000 mg | ORAL_TABLET | Freq: Three times a day (TID) | ORAL | 3 refills | Status: DC | PRN

## 2017-03-05 NOTE — Telephone Encounter (Signed)
Scheduled CT scan for 8/13 @ 1pm, patient will need to come by to pick up contrast dye prior to appt. Ammie FerrierJanice Barnes on phone with patient currently- she will inform patient of appt.

## 2017-03-05 NOTE — Progress Notes (Signed)
Subjective:     Julia Armstrong is a 26 y.o. female who presents to the clinic 3 weeks status post D&E for retained POCs.  Patient reports having continued diffuse abdominal and pelvic pain s/p procedure, has been seen in MAU twice and had treatment with Augmentin for presumptive endometritis.  She is more concerned about the worsening pain in her lower back.  Eating a regular diet without difficulty. Bowel movements are normal. Denies any abnormal vaginal discharge, fevers, chills, sweats, dysuria, nausea, vomiting, other GI or GU symptoms or other general symptoms.  The following portions of the patient's history were reviewed and updated as appropriate: allergies, current medications, past family history, past medical history, past social history, past surgical history and problem list.  Review of Systems Pertinent items noted in HPI and remainder of comprehensive ROS otherwise negative.    Objective:    BP 128/83   Pulse 78   Ht 5\' 7"  (1.702 m)   Wt 208 lb (94.3 kg)   BMI 32.58 kg/m  General:  alert and no distress  Back: tenderness on palpation of entire spine  Abdomen: soft, bowel sounds active, moderate lower abdominal tenderness  Pelvic:   Deferred as per patient's request     Assessment:    Doing well postoperatively. Operative findings again reviewed. Pathology report discussed.    Plan:   1.  Percocet, Ibuprofen, Flexeril prescribed for patient. 2.  Pelvic ultrasound, CT scan ordered given diffuse symptoms; unsure of etiology. No fevers. No concern about visceral injury at this point. 3. Told to return for worsening symptoms.  Jaynie CollinsUGONNA  Calina Patrie, MD, FACOG Attending Obstetrician & Gynecologist, [facility], [facility]

## 2017-03-06 ENCOUNTER — Ambulatory Visit (HOSPITAL_COMMUNITY): Admission: RE | Admit: 2017-03-06 | Payer: Medicaid Other | Source: Ambulatory Visit

## 2017-03-06 NOTE — Telephone Encounter (Signed)
Patient is aware of appointment °

## 2017-03-09 ENCOUNTER — Ambulatory Visit (HOSPITAL_COMMUNITY): Admission: RE | Admit: 2017-03-09 | Payer: Medicaid Other | Source: Ambulatory Visit

## 2017-03-13 ENCOUNTER — Ambulatory Visit (HOSPITAL_COMMUNITY): Admission: RE | Admit: 2017-03-13 | Payer: Medicaid Other | Source: Ambulatory Visit

## 2017-03-14 ENCOUNTER — Encounter (HOSPITAL_COMMUNITY): Payer: Self-pay | Admitting: Nurse Practitioner

## 2017-03-14 DIAGNOSIS — Z79899 Other long term (current) drug therapy: Secondary | ICD-10-CM | POA: Insufficient documentation

## 2017-03-14 DIAGNOSIS — S0502XA Injury of conjunctiva and corneal abrasion without foreign body, left eye, initial encounter: Secondary | ICD-10-CM | POA: Diagnosis not present

## 2017-03-14 DIAGNOSIS — W25XXXA Contact with sharp glass, initial encounter: Secondary | ICD-10-CM | POA: Insufficient documentation

## 2017-03-14 DIAGNOSIS — Y999 Unspecified external cause status: Secondary | ICD-10-CM | POA: Insufficient documentation

## 2017-03-14 DIAGNOSIS — Z23 Encounter for immunization: Secondary | ICD-10-CM | POA: Insufficient documentation

## 2017-03-14 DIAGNOSIS — Y929 Unspecified place or not applicable: Secondary | ICD-10-CM | POA: Insufficient documentation

## 2017-03-14 DIAGNOSIS — S0592XA Unspecified injury of left eye and orbit, initial encounter: Secondary | ICD-10-CM | POA: Diagnosis present

## 2017-03-14 DIAGNOSIS — Y9389 Activity, other specified: Secondary | ICD-10-CM | POA: Diagnosis not present

## 2017-03-14 NOTE — ED Triage Notes (Signed)
Pt is c/o left eye pain that she states is likely from the glass debris from her truck back window that shattered.

## 2017-03-15 ENCOUNTER — Emergency Department (HOSPITAL_COMMUNITY)
Admission: EM | Admit: 2017-03-15 | Discharge: 2017-03-15 | Disposition: A | Discharge status: Home / Self Care | Payer: Medicaid Other | Attending: Emergency Medicine | Admitting: Emergency Medicine

## 2017-03-15 DIAGNOSIS — S0502XA Injury of conjunctiva and corneal abrasion without foreign body, left eye, initial encounter: Secondary | ICD-10-CM

## 2017-03-15 DIAGNOSIS — G8918 Other acute postprocedural pain: Secondary | ICD-10-CM

## 2017-03-15 MED ORDER — TETANUS-DIPHTH-ACELL PERTUSSIS 5-2.5-18.5 LF-MCG/0.5 IM SUSP
0.5000 mL | Freq: Once | INTRAMUSCULAR | Status: AC
Start: 2017-03-15 — End: 2017-03-15
  Administered 2017-03-15: 0.5 mL via INTRAMUSCULAR
  Filled 2017-03-15: qty 0.5

## 2017-03-15 MED ORDER — FLUORESCEIN SODIUM 0.6 MG OP STRP
1.0000 | ORAL_STRIP | Freq: Once | OPHTHALMIC | Status: AC
  Administered 2017-03-15: 1 via OPHTHALMIC
  Filled 2017-03-15: qty 1

## 2017-03-15 MED ORDER — ERYTHROMYCIN 5 MG/GM OP OINT: 1.0000 "application " | TOPICAL_OINTMENT | Freq: Four times a day (QID) | OPHTHALMIC | 1 refills | Status: DC

## 2017-03-15 MED ORDER — IBUPROFEN 800 MG PO TABS
800.0000 mg | ORAL_TABLET | Freq: Once | ORAL | Status: AC
  Administered 2017-03-15: 800 mg via ORAL
  Filled 2017-03-15: qty 1

## 2017-03-15 MED ORDER — TETRACAINE HCL 0.5 % OP SOLN
1.0000 [drp] | Freq: Once | OPHTHALMIC | Status: AC
  Administered 2017-03-15: 1 [drp] via OPHTHALMIC
  Filled 2017-03-15: qty 4

## 2017-03-15 MED ORDER — ACETAMINOPHEN 325 MG PO TABS
650.0000 mg | ORAL_TABLET | Freq: Once | ORAL | Status: AC
  Administered 2017-03-15: 650 mg via ORAL
  Filled 2017-03-15: qty 2

## 2017-03-15 MED ORDER — IBUPROFEN 800 MG PO TABS: 800.0000 mg | ORAL_TABLET | Freq: Three times a day (TID) | ORAL | 0 refills | Status: DC | PRN

## 2017-03-15 NOTE — ED Notes (Signed)
Bed: WTR7 Expected date:  Expected time:  Means of arrival:  Comments: 

## 2017-03-15 NOTE — Discharge Instructions (Signed)
Use erythromycin ointment as prescribed for the next 5 days. If pain persists or should you experience vision changes, we recommend that you follow-up with an ophthalmologist. You may return to the emergency department, as needed, for new or concerning symptoms.

## 2017-03-15 NOTE — ED Provider Notes (Signed)
[facility]-EMERGENCY DEPT Provider Note   CSN: [id number] Arrival date & time: 03/14/17  2217    History   Chief Complaint Chief Complaint  Patient presents with  . Eye Pain    HPI Julia Armstrong is a 26 y.o. female.  26 year old female presents to the emergency department for evaluation of left eye pain. She states that she was changing her tire and throwing the spare in the back of her truck when her window shattered and glass debris flew back in her face. She believes that some of this glass scratched her eye. She has tried flushing her eyes with water, but notes persistent pain and clear tearing in her left eye. She has not had any blurry vision or vision loss. She does note an associated headache. She took ibuprofen prior to ED arrival with mild improvement.   The history is provided by the patient. No language interpreter was used.  Eye Pain     Past Medical History:  Diagnosis Date  . Gallstones     There are no active problems to display for this patient.   Past Surgical History:  Procedure Laterality Date  . CESAREAN SECTION    . DILATION AND EVACUATION N/A 02/18/2017   Procedure: DILATATION AND EVACUATION;  Surgeon: Tereso Newcomer, MD;  Location: [facility];  Service: Gynecology;  Laterality: N/A;    OB History    Gravida Para Term Preterm AB Living   5 3 3   2 3    SAB TAB Ectopic Multiple Live Births   2       3       Home Medications    Prior to Admission medications   Medication Sig Start Date End Date Taking? Authorizing Provider  cyclobenzaprine (FLEXERIL) 10 MG tablet Take 1 tablet (10 mg total) by mouth 3 (three) times daily as needed for muscle spasms. 03/05/17   Anyanwu, Jethro Bastos, MD  docusate sodium (COLACE) 100 MG capsule Take 1 capsule (100 mg total) by mouth 2 (two) times daily as needed. 02/18/17   Anyanwu, Jethro Bastos, MD  erythromycin ophthalmic ointment Place 1 application into both eyes every 6 (six) hours. Place 1/2 inch ribbon of ointment in  the affected eye 4 times a day 03/15/17   Antony Madura, PA-C  famotidine (PEPCID) 20 MG tablet Take 20 mg by mouth 2 (two) times daily as needed for heartburn or indigestion.  02/11/17   [provider]  ibuprofen (ADVIL,MOTRIN) 800 MG tablet Take 1 tablet (800 mg total) by mouth every 8 (eight) hours as needed. 03/15/17   Antony Madura, PA-C  oxyCODONE-acetaminophen (PERCOCET/ROXICET) 5-325 MG tablet Take 1-2 tablets by mouth every 6 (six) hours as needed. 03/05/17   Anyanwu, Jethro Bastos, MD  Prenatal Vit-Fe Fumarate-FA (PNV PRENATAL PLUS MULTIVITAMIN) 27-1 MG TABS Take 1 tablet by mouth daily. 01/30/17   [provider]    Family History Family History  Problem Relation Age of Onset  . Cancer Other     Social History Social History  Substance Use Topics  . Smoking status: Never Smoker  . Smokeless tobacco: Never Used  . Alcohol use No     Allergies   Patient has no known allergies.   Review of Systems Review of Systems  Eyes: Positive for pain.  Ten systems reviewed and are negative for acute change, except as noted in the HPI.    Physical Exam Updated Vital Signs BP (!) 147/105 (BP Location: Right Arm)   Pulse 65  Temp 98.6 F (37 C) (Oral)   Resp 16   SpO2 100%   Physical Exam  Constitutional: She is oriented to person, place, and time. She appears well-developed and well-nourished. No distress.  Nontoxic and in NAD  HENT:  Head: Normocephalic and atraumatic.  Eyes: Pupils are equal, round, and reactive to light. EOM are normal. No scleral icterus.  Slit lamp exam:      The left eye shows corneal abrasion.    Mild tearing of the left eye. Normal EOMs. Pupils equal round and reactive bilaterally. No proptosis or hyphema. No appreciable foreign body. No consensual photophobia. +fluorescein uptake OS c/w corneal abrasion. No dendritic staining. Negative Seidel's sign.  Neck: Normal range of motion.  Pulmonary/Chest: Effort normal. No respiratory distress.   Respirations even and unlabored  Musculoskeletal: Normal range of motion.  Neurological: She is alert and oriented to person, place, and time. She exhibits normal muscle tone. Coordination normal.  GCS 15. Patient moving all extremities.  Skin: Skin is warm and dry. No rash noted. She is not diaphoretic. No erythema. No pallor.  Psychiatric: She has a normal mood and affect. Her behavior is normal.  Nursing note and vitals reviewed.    ED Treatments / Results  Labs (all labs ordered are listed, but only abnormal results are displayed) Labs Reviewed - No data to display  EKG  EKG Interpretation None       Radiology No results found.  Procedures Procedures (including critical care time)  Medications Ordered in ED Medications  tetracaine (PONTOCAINE) 0.5 % ophthalmic solution 1 drop (1 drop Left Eye Given 03/15/17 0410)  fluorescein ophthalmic strip 1 strip (1 strip Both Eyes Given 03/15/17 0413)  acetaminophen (TYLENOL) tablet 650 mg (650 mg Oral Given 03/15/17 0409)  ibuprofen (ADVIL,MOTRIN) tablet 800 mg (800 mg Oral Given 03/15/17 0409)  Tdap (BOOSTRIX) injection 0.5 mL (0.5 mLs Intramuscular Given 03/15/17 0413)     Initial Impression / Assessment and Plan / ED Course  I have reviewed the triage vital signs and the nursing notes.  Pertinent labs & imaging results that were available during my care of the patient were reviewed by me and considered in my medical decision making (see chart for details).     26 year old female presents to the emergency department for eye irritation. She states that she was throwing a spare tire and the back of her truck when glass debris flew back at her face. She has no complaints of blurry vision or vision loss. She is having more discomfort in her left eye compared to the right. She states that she feels as though there is still debris in her eye. No foreign bodies visualized on exam. There is evidence of a corneal abrasion to the left  thigh; punctate. No concern for traumatic iritis. No consensual photophobia. Pupils equal round and reactive. No proptosis or hyphema. Eyes copiously irrigated with Lequita Halt lens and fluids. Will discharge with erythromycin ointment and refer to ophthalmology should symptoms persist. Supportive care indicated with return if symptoms worsen. Patient discharged in stable condition with no unaddressed concerns.    Final Clinical Impressions(s) / ED Diagnoses   Final diagnoses:  Abrasion of left cornea, initial encounter    New Prescriptions New Prescriptions   ERYTHROMYCIN OPHTHALMIC OINTMENT    Place 1 application into both eyes every 6 (six) hours. Place 1/2 inch ribbon of ointment in the affected eye 4 times a day     Antony Madura, PA-C 03/15/17 0500  Molpus, Jonny Ruiz, MD 03/15/17 [phone]

## 2017-07-21 ENCOUNTER — Emergency Department (HOSPITAL_COMMUNITY)
Admission: EM | Admit: 2017-07-21 | Discharge: 2017-07-21 | Discharge status: Against Medical Advice | Payer: Medicaid Other | Attending: Emergency Medicine | Admitting: Emergency Medicine

## 2017-07-21 ENCOUNTER — Emergency Department (HOSPITAL_COMMUNITY)
Admission: EM | Admit: 2017-07-21 | Discharge: 2017-07-22 | Disposition: A | Discharge status: Home / Self Care | Payer: Medicaid Other | Source: Home / Self Care

## 2017-07-21 ENCOUNTER — Encounter (HOSPITAL_COMMUNITY): Payer: Self-pay

## 2017-07-21 ENCOUNTER — Other Ambulatory Visit: Payer: Self-pay

## 2017-07-21 DIAGNOSIS — Z5321 Procedure and treatment not carried out due to patient leaving prior to being seen by health care provider: Secondary | ICD-10-CM

## 2017-07-21 DIAGNOSIS — M79605 Pain in left leg: Secondary | ICD-10-CM | POA: Diagnosis present

## 2017-07-21 DIAGNOSIS — R109 Unspecified abdominal pain: Secondary | ICD-10-CM

## 2017-07-21 NOTE — ED Notes (Signed)
Called for triage with no answer

## 2017-07-21 NOTE — ED Notes (Signed)
Pt has decided to leave AMA. 

## 2017-07-21 NOTE — ED Triage Notes (Signed)
Pt here for leg pain in left calf onset 2 days ago, denies swelling only pain

## 2017-07-22 ENCOUNTER — Encounter (HOSPITAL_COMMUNITY): Payer: Self-pay | Admitting: Family Medicine

## 2017-07-22 NOTE — ED Notes (Addendum)
Pt said the pain started when she bent down to pick something up and heard a "pop" around 3 days ago. The pain is worse with movement. She said she had a cesarean section on July 27th, 2018.

## 2017-07-22 NOTE — ED Triage Notes (Signed)
Patient reports 2 days ago, she bent down to pick up her son's shoe when she felt a "pop" at her c-section scar. Patient reports she had c-section in 2016. The scar is healed with open areas. No drainage from the skin. [facility] but ED notes report it was leg pain.

## 2017-07-23 ENCOUNTER — Other Ambulatory Visit: Payer: Self-pay

## 2017-07-23 ENCOUNTER — Emergency Department (HOSPITAL_COMMUNITY)
Admission: EM | Admit: 2017-07-23 | Discharge: 2017-07-23 | Disposition: A | Discharge status: Home / Self Care | Payer: Medicaid Other | Attending: Emergency Medicine | Admitting: Emergency Medicine

## 2017-07-23 ENCOUNTER — Emergency Department (HOSPITAL_COMMUNITY)
Admission: EM | Admit: 2017-07-23 | Discharge: 2017-07-23 | Discharge status: Against Medical Advice | Payer: Medicaid Other

## 2017-07-23 ENCOUNTER — Encounter (HOSPITAL_COMMUNITY): Payer: Self-pay

## 2017-07-23 ENCOUNTER — Emergency Department (HOSPITAL_COMMUNITY)
Admission: EM | Admit: 2017-07-23 | Discharge: 2017-07-23 | Disposition: A | Discharge status: Home / Self Care | Payer: Medicaid Other | Source: Home / Self Care | Attending: Emergency Medicine | Admitting: Emergency Medicine

## 2017-07-23 ENCOUNTER — Emergency Department (HOSPITAL_COMMUNITY): Payer: Medicaid Other

## 2017-07-23 DIAGNOSIS — Z79899 Other long term (current) drug therapy: Secondary | ICD-10-CM | POA: Diagnosis not present

## 2017-07-23 DIAGNOSIS — R1030 Lower abdominal pain, unspecified: Secondary | ICD-10-CM | POA: Insufficient documentation

## 2017-07-23 DIAGNOSIS — L905 Scar conditions and fibrosis of skin: Secondary | ICD-10-CM

## 2017-07-23 DIAGNOSIS — R52 Pain, unspecified: Principal | ICD-10-CM

## 2017-07-23 LAB — PREGNANCY, URINE: PREG TEST UR: NEGATIVE

## 2017-07-23 NOTE — ED Provider Notes (Signed)
[facility] Note   CSN: [id number] Arrival date & time: 07/23/17  0005     History   Chief Complaint Chief Complaint  Patient presents with  . Scar    c-section scar pain/knot    HPI Julia Armstrong is a 26 y.o. female.  26yo F w/ PMH including c-section who p/w abdominal pain.  Approximately 3 days ago, she bent down to pick something up and felt a pop around her C-section incision site at her lower abdomen.  Her C-section was in 2016.  Since then, she has had severe, migratory pain along her incision that was initially at the right side and is currently towards the middle of the incision.  She has felt a knot in this area that is tender to palpation.  Her pain is worse when she tries to lay flat or walk around and she has had a sleep sitting up because laying flat is too painful.  She has had some nausea today but no vomiting or change in bowel movements.  No fevers, urinary symptoms, or recent illness.  She has an appointment with OB/GYN next week but her pain worsened tonight which is what prompted her to come in.   The history is provided by the patient.    Past Medical History:  Diagnosis Date  . Gallstones     There are no active problems to display for this patient.   Past Surgical History:  Procedure Laterality Date  . CESAREAN SECTION    . DILATION AND EVACUATION N/A 02/18/2017   Procedure: DILATATION AND EVACUATION;  Surgeon: Tereso NewcomerAnyanwu, Ugonna A, MD;  Location: [facility];  Service: Gynecology;  Laterality: N/A;    OB History    Gravida Para Term Preterm AB Living   5 3 3   2 3    SAB TAB Ectopic Multiple Live Births   2       3       Home Medications    Prior to Admission medications   Medication Sig Start Date End Date Taking? Authorizing Provider  cyclobenzaprine (FLEXERIL) 10 MG tablet Take 1 tablet (10 mg total) by mouth 3 (three) times daily as needed for muscle spasms. 03/05/17   Anyanwu, Jethro BastosUgonna A, MD    docusate sodium (COLACE) 100 MG capsule Take 1 capsule (100 mg total) by mouth 2 (two) times daily as needed. 02/18/17   Anyanwu, Jethro BastosUgonna A, MD  erythromycin ophthalmic ointment Place 1 application into both eyes every 6 (six) hours. Place 1/2 inch ribbon of ointment in the affected eye 4 times a day 03/15/17   Antony MaduraHumes, Kelly, PA-C  famotidine (PEPCID) 20 MG tablet Take 20 mg by mouth 2 (two) times daily as needed for heartburn or indigestion.  02/11/17   [provider]  ibuprofen (ADVIL,MOTRIN) 800 MG tablet Take 1 tablet (800 mg total) by mouth every 8 (eight) hours as needed. 03/15/17   Antony MaduraHumes, Kelly, PA-C  oxyCODONE-acetaminophen (PERCOCET/ROXICET) 5-325 MG tablet Take 1-2 tablets by mouth every 6 (six) hours as needed. 03/05/17   Anyanwu, Jethro BastosUgonna A, MD  Prenatal Vit-Fe Fumarate-FA (PNV PRENATAL PLUS MULTIVITAMIN) 27-1 MG TABS Take 1 tablet by mouth daily. 01/30/17   [provider]    Family History Family History  Problem Relation Age of Onset  . Cancer Other     Social History Social History   Tobacco Use  . Smoking status: Never Smoker  . Smokeless tobacco: Never Used  Substance Use Topics  . Alcohol  use: No  . Drug use: No     Allergies   Patient has no known allergies.   Review of Systems Review of Systems All other systems reviewed and are negative except that which was mentioned in HPI   Physical Exam Updated Vital Signs BP 133/70   Pulse 72   Temp 98.1 F (36.7 C) (Oral)   Resp 16   LMP 07/15/2017   SpO2 99%   Physical Exam  Constitutional: She is oriented to person, place, and time. She appears well-developed and well-nourished. No distress.  uncomfortable  HENT:  Head: Normocephalic and atraumatic.  Moist mucous membranes  Eyes: Conjunctivae are normal. Pupils are equal, round, and reactive to light.  Neck: Neck supple.  Cardiovascular: Normal rate, regular rhythm and normal heart sounds.  No murmur heard. Pulmonary/Chest: Effort normal  and breath sounds normal.  Abdominal: Soft. Bowel sounds are normal. She exhibits no distension. There is tenderness. There is no rebound and no guarding.  Suprapubic scar with tenderness to light palpation, worst in central portion of scar with small pea-sized firm mass  Musculoskeletal: She exhibits no edema.  Neurological: She is alert and oriented to person, place, and time.  Fluent speech  Skin: Skin is warm and dry.  Psychiatric: She has a normal mood and affect. Judgment normal.  Nursing note and vitals reviewed.    ED Treatments / Results  Labs (all labs ordered are listed, but only abnormal results are displayed) Labs Reviewed  PREGNANCY, URINE    EKG  EKG Interpretation None       Radiology No results found.  Procedures Procedures (including critical care time)  Medications Ordered in ED Medications - No data to display   Initial Impression / Assessment and Plan / ED Course  I have reviewed the triage vital signs and the nursing notes.  Pertinent labs  that were available during my care of the patient were reviewed by me and considered in my medical decision making (see chart for details).     3d pain at c-section scar after feeling "pop", tender across scar on exam.  I explained that it is possible to have a hernia related to previous surgical incision, although I doubt she has strangulated hernia it is certainly possible that she has incarcerated area given that she has had persistent and severe pain.  Recommended CT for better evaluation of this area.  She states that she had to take her husband to work but wanted to return after she dropped him off to obtain this study.  I have discussed risks of leaving and she has voiced understanding.  Patient discharged with instructions to return as soon as possible, to call EMS if symptoms severely worsen.  Final Clinical Impressions(s) / ED Diagnoses   Final diagnoses:  Pain in surgical scar    ED Discharge  Orders    None       Little, Ambrose Finland, MD 07/23/17 [phone]

## 2017-07-23 NOTE — ED Triage Notes (Signed)
Pt came in as palnned by her and dr little  She had been in the room approx 5 minutes when she received a call about her child she has to leave because he is sick

## 2017-07-23 NOTE — ED Triage Notes (Signed)
Pt left unable to dismiss her chart locked

## 2017-07-23 NOTE — ED Triage Notes (Signed)
Pt arrives to Ed with complaints of a knot inside of c-section scar from 2016; Pt states she notice knot in scar about 3 nights ago;  Pt c/o pain at 10/10 on arrival. Pt states she does not have OBGYN; pt a&ox 4 on arrival.-Monique,RN

## 2017-07-23 NOTE — ED Provider Notes (Addendum)
[facility] Provider Note   CSN: [id number] Arrival date & time: 07/23/17  [id number]     History   Chief Complaint Chief Complaint  Patient presents with  . Scar    knot in c-section scar    HPI Julia Armstrong is a 26 y.o. female.  26 year old female with history of C-section who presents with abdominal pain.  She presented here earlier tonight with 3 days of persistent, severe abdominal pain in her lower abdomen at the site of her C-section scar after she felt a pop when she bent over to pick something up.  She has had nausea but no vomiting or diarrhea. normal bowel movements.  No fever or urinary symptoms.  She had a drop her husband off at work and returns now for completion of her workup.  No change in the pain with eating.   The history is provided by the patient.    Past Medical History:  Diagnosis Date  . Gallstones     There are no active problems to display for this patient.   Past Surgical History:  Procedure Laterality Date  . CESAREAN SECTION    . DILATION AND EVACUATION N/A 02/18/2017   Procedure: DILATATION AND EVACUATION;  Surgeon: Tereso NewcomerAnyanwu, Ugonna A, MD;  Location: [facility];  Service: Gynecology;  Laterality: N/A;    OB History    Gravida Para Term Preterm AB Living   5 3 3   2 3    SAB TAB Ectopic Multiple Live Births   2       3       Home Medications    Prior to Admission medications   Medication Sig Start Date End Date Taking? Authorizing Provider  cyclobenzaprine (FLEXERIL) 10 MG tablet Take 1 tablet (10 mg total) by mouth 3 (three) times daily as needed for muscle spasms. 03/05/17   Anyanwu, Jethro BastosUgonna A, MD  docusate sodium (COLACE) 100 MG capsule Take 1 capsule (100 mg total) by mouth 2 (two) times daily as needed. 02/18/17   Anyanwu, Jethro BastosUgonna A, MD  erythromycin ophthalmic ointment Place 1 application into both eyes every 6 (six) hours. Place 1/2 inch ribbon of ointment in the affected eye 4 times a day 03/15/17    Antony MaduraHumes, Kelly, PA-C  famotidine (PEPCID) 20 MG tablet Take 20 mg by mouth 2 (two) times daily as needed for heartburn or indigestion.  02/11/17   [provider]  ibuprofen (ADVIL,MOTRIN) 800 MG tablet Take 1 tablet (800 mg total) by mouth every 8 (eight) hours as needed. 03/15/17   Antony MaduraHumes, Kelly, PA-C  oxyCODONE-acetaminophen (PERCOCET/ROXICET) 5-325 MG tablet Take 1-2 tablets by mouth every 6 (six) hours as needed. 03/05/17   Anyanwu, Jethro BastosUgonna A, MD  Prenatal Vit-Fe Fumarate-FA (PNV PRENATAL PLUS MULTIVITAMIN) 27-1 MG TABS Take 1 tablet by mouth daily. 01/30/17   [provider]    Family History Family History  Problem Relation Age of Onset  . Cancer Other     Social History Social History   Tobacco Use  . Smoking status: Never Smoker  . Smokeless tobacco: Never Used  Substance Use Topics  . Alcohol use: No  . Drug use: No     Allergies   Patient has no known allergies.   Review of Systems Review of Systems All other systems reviewed and are negative except that which was mentioned in HPI   Physical Exam Updated Vital Signs BP (!) 117/57 (BP Location: Right Arm)   Pulse 69   Temp  97.9 F (36.6 C) (Oral)   Resp 18   LMP 07/15/2017   SpO2 97%   Physical Exam Physical Exam  Constitutional: She is oriented to person, place, and time. She appears well-developed and well-nourished. No distress.  HENT:  Head: Normocephalic and atraumatic.  Moist mucous membranes  Eyes: Conjunctivae are normal. Pupils are equal, round, and reactive to light.  Neck: Neck supple.  Cardiovascular: Normal rate, regular rhythm and normal heart sounds.  No murmur heard. Pulmonary/Chest: Effort normal and breath sounds normal.  Abdominal: Soft. Bowel sounds are normal. She exhibits no distension. There is tenderness. There is no rebound and no guarding.  Suprapubic scar with tenderness to light palpation, worst in central portion of scar with small pea-sized firm mass    Musculoskeletal: She exhibits no edema.  Neurological: She is alert and oriented to person, place, and time.  Fluent speech  Skin: Skin is warm and dry.  Psychiatric: She has a normal mood and affect. Judgment normal.  Nursing note and vitals reviewed.   ED Treatments / Results  Labs (all labs ordered are listed, but only abnormal results are displayed) Labs Reviewed - No data to display  EKG  EKG Interpretation None       Radiology No results found.  Procedures Procedures (including critical care time)  Medications Ordered in ED Medications - No data to display   Initial Impression / Assessment and Plan / ED Course  I have reviewed the triage vital signs and the nursing notes.  Pertinent imaging results that were available during my care of the patient were reviewed by me and considered in my medical decision making (see chart for details).     Pt here for completion of work up after I saw her earlier tonight. Concern for possible incarcerated hernia. No vomiting, diarrhea, infectious sx, or pain outside of scar site therefore I do not feel we need labs at this time. UPT negative from earlier tonight. After discussion of risks and benefits of CT, pt wanted to proceed. CT without acute findings to explain sx. Possible gallstones but she has no pain outside of scar site, no upper pain whatsoever.  Discussed supportive measures and follow-up with OB/GYN who did surgery.  Return precautions given.  Final Clinical Impressions(s) / ED Diagnoses   Final diagnoses:  Pain in surgical scar    ED Discharge Orders    None       Siya Flurry, Ambrose Finlandachel Morgan, MD 07/23/17 [id number]    Clarene DukeLittle, Ambrose Finlandachel Morgan, MD 07/23/17 [phone]

## 2017-07-23 NOTE — ED Triage Notes (Signed)
Pt returning from ealier visit; pt has left AMA several times in last two days for same c/o noticing  "a knot" on the inside C-section scar from 2016. Pt states she was picking up baby and and felt knot; pt c/o pain at 10/10 but talking on phone with family with no sign of pain;pt a&ox 4 on arrival.-Monique,RN

## 2017-07-23 NOTE — ED Notes (Signed)
Patient transported to CT 

## 2017-07-23 NOTE — Discharge Instructions (Signed)
PLEASE RETURN TO ER TODAY AS SOON AS POSSIBLE FOR FURTHER WORK UP.

## 2017-08-21 ENCOUNTER — Emergency Department (HOSPITAL_COMMUNITY)
Admission: EM | Admit: 2017-08-21 | Discharge: 2017-08-21 | Disposition: A | Discharge status: Home / Self Care | Payer: Medicaid Other | Attending: Emergency Medicine | Admitting: Emergency Medicine

## 2017-08-21 ENCOUNTER — Encounter (HOSPITAL_COMMUNITY): Payer: Self-pay | Admitting: Emergency Medicine

## 2017-08-21 DIAGNOSIS — R109 Unspecified abdominal pain: Secondary | ICD-10-CM | POA: Diagnosis not present

## 2017-08-21 DIAGNOSIS — R51 Headache: Secondary | ICD-10-CM | POA: Insufficient documentation

## 2017-08-21 DIAGNOSIS — R05 Cough: Secondary | ICD-10-CM | POA: Insufficient documentation

## 2017-08-21 DIAGNOSIS — Z5321 Procedure and treatment not carried out due to patient leaving prior to being seen by health care provider: Secondary | ICD-10-CM | POA: Diagnosis not present

## 2017-08-21 LAB — CBC WITH DIFFERENTIAL/PLATELET
Basophils Absolute: 0 10*3/uL (ref 0.0–0.1)
Basophils Relative: 0 %
Eosinophils Absolute: 0.1 10*3/uL (ref 0.0–0.7)
Eosinophils Relative: 2 %
HEMATOCRIT: 37.1 % (ref 36.0–46.0)
HEMOGLOBIN: 11.9 g/dL — AB (ref 12.0–15.0)
Lymphocytes Relative: 41 %
Lymphs Abs: 2.2 10*3/uL (ref 0.7–4.0)
MCH: 25.1 pg — ABNORMAL LOW (ref 26.0–34.0)
MCHC: 32.1 g/dL (ref 30.0–36.0)
MCV: 78.3 fL (ref 78.0–100.0)
MONO ABS: 0.4 10*3/uL (ref 0.1–1.0)
MONOS PCT: 7 %
NEUTROS ABS: 2.7 10*3/uL (ref 1.7–7.7)
NEUTROS PCT: 50 %
Platelets: 239 10*3/uL (ref 150–400)
RBC: 4.74 MIL/uL (ref 3.87–5.11)
RDW: 14.7 % (ref 11.5–15.5)
WBC: 5.3 10*3/uL (ref 4.0–10.5)

## 2017-08-21 LAB — COMPREHENSIVE METABOLIC PANEL
ALBUMIN: 3.2 g/dL — AB (ref 3.5–5.0)
ALK PHOS: 74 U/L (ref 38–126)
ALT: 16 U/L (ref 14–54)
ANION GAP: 9 (ref 5–15)
AST: 23 U/L (ref 15–41)
BUN: 7 mg/dL (ref 6–20)
CO2: 25 mmol/L (ref 22–32)
Calcium: 8.9 mg/dL (ref 8.9–10.3)
Chloride: 106 mmol/L (ref 101–111)
Creatinine, Ser: 0.71 mg/dL (ref 0.44–1.00)
GFR calc Af Amer: >60 mL/min (ref >60)
GLUCOSE: 104 mg/dL — AB (ref 65–99)
POTASSIUM: 3.7 mmol/L (ref 3.5–5.1)
Sodium: 140 mmol/L (ref 135–145)
TOTAL PROTEIN: 6.6 g/dL (ref 6.5–8.1)
Total Bilirubin: 0.2 mg/dL — ABNORMAL LOW (ref 0.3–1.2)

## 2017-08-21 LAB — URINALYSIS, ROUTINE W REFLEX MICROSCOPIC
BILIRUBIN URINE: NEGATIVE
Glucose, UA: NEGATIVE mg/dL
HGB URINE DIPSTICK: NEGATIVE
Ketones, ur: NEGATIVE mg/dL
Leukocytes, UA: NEGATIVE
Nitrite: NEGATIVE
Protein, ur: NEGATIVE mg/dL
SPECIFIC GRAVITY, URINE: 1.03 (ref 1.005–1.030)
pH: 5 (ref 5.0–8.0)

## 2017-08-21 LAB — LIPASE, BLOOD: LIPASE: 33 U/L (ref 11–51)

## 2017-08-21 LAB — I-STAT BETA HCG BLOOD, ED (MC, WL, AP ONLY): I-stat hCG, quantitative: <5 m[IU]/mL (ref <5)
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
## 2017-08-21 NOTE — ED Notes (Signed)
ED Provider at bedside. 

## 2017-08-21 NOTE — ED Notes (Signed)
Urine culture collected just in case needed

## 2017-08-21 NOTE — ED Notes (Signed)
Pt's name called for a room.  No response.
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
## 2017-08-26 ENCOUNTER — Emergency Department (HOSPITAL_COMMUNITY)
Admission: EM | Admit: 2017-08-26 | Discharge: 2017-08-26 | Disposition: A | Discharge status: Home / Self Care | Payer: Medicaid Other | Attending: Emergency Medicine | Admitting: Emergency Medicine

## 2017-08-26 ENCOUNTER — Encounter (HOSPITAL_COMMUNITY): Payer: Self-pay | Admitting: *Deleted

## 2017-08-26 ENCOUNTER — Other Ambulatory Visit: Payer: Self-pay

## 2017-08-26 DIAGNOSIS — Z5321 Procedure and treatment not carried out due to patient leaving prior to being seen by health care provider: Secondary | ICD-10-CM | POA: Insufficient documentation

## 2017-08-26 DIAGNOSIS — R109 Unspecified abdominal pain: Secondary | ICD-10-CM | POA: Insufficient documentation

## 2017-08-26 LAB — I-STAT BETA HCG BLOOD, ED (MC, WL, AP ONLY): I-stat hCG, quantitative: <5 m[IU]/mL (ref <5)

## 2017-08-26 LAB — COMPREHENSIVE METABOLIC PANEL
ALBUMIN: 3.5 g/dL (ref 3.5–5.0)
ALT: 15 U/L (ref 14–54)
AST: 21 U/L (ref 15–41)
Alkaline Phosphatase: 64 U/L (ref 38–126)
Anion gap: 10 (ref 5–15)
BUN: 9 mg/dL (ref 6–20)
CHLORIDE: 106 mmol/L (ref 101–111)
CO2: 23 mmol/L (ref 22–32)
CREATININE: 0.79 mg/dL (ref 0.44–1.00)
Calcium: 8.9 mg/dL (ref 8.9–10.3)
GFR calc non Af Amer: >60 mL/min (ref >60)
Glucose, Bld: 90 mg/dL (ref 65–99)
Potassium: 4 mmol/L (ref 3.5–5.1)
Sodium: 139 mmol/L (ref 135–145)
Total Bilirubin: 0.4 mg/dL (ref 0.3–1.2)
Total Protein: 6.6 g/dL (ref 6.5–8.1)

## 2017-08-26 LAB — URINALYSIS, ROUTINE W REFLEX MICROSCOPIC
Bilirubin Urine: NEGATIVE
GLUCOSE, UA: NEGATIVE mg/dL
Hgb urine dipstick: NEGATIVE
Ketones, ur: NEGATIVE mg/dL
Leukocytes, UA: NEGATIVE
Nitrite: NEGATIVE
PROTEIN: NEGATIVE mg/dL
Specific Gravity, Urine: 1.021 (ref 1.005–1.030)
pH: 7 (ref 5.0–8.0)

## 2017-08-26 LAB — CBC
HCT: 39.3 % (ref 36.0–46.0)
Hemoglobin: 12.6 g/dL (ref 12.0–15.0)
MCH: 25 pg — ABNORMAL LOW (ref 26.0–34.0)
MCHC: 32.1 g/dL (ref 30.0–36.0)
MCV: 78.1 fL (ref 78.0–100.0)
PLATELETS: 244 10*3/uL (ref 150–400)
RBC: 5.03 MIL/uL (ref 3.87–5.11)
RDW: 14.8 % (ref 11.5–15.5)
WBC: 6.1 10*3/uL (ref 4.0–10.5)

## 2017-08-26 LAB — LIPASE, BLOOD: LIPASE: 32 U/L (ref 11–51)

## 2017-08-26 NOTE — ED Notes (Signed)
Patient up to desk.  Provided stickers and arm bracelet to registration.  States she does not want to wait anymore.
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
## 2017-08-26 NOTE — ED Notes (Signed)
Patient upset that her boyfriend was in and out before her and keeps coming up to ask why she is waiting.  RN explained that her complaint might require privacy (a room) and he did not need that.  (waited 2:737mins)

## 2017-08-30 ENCOUNTER — Emergency Department (HOSPITAL_COMMUNITY)
Admission: EM | Admit: 2017-08-30 | Discharge: 2017-08-30 | Disposition: A | Discharge status: Home / Self Care | Payer: Medicaid Other | Attending: Emergency Medicine | Admitting: Emergency Medicine

## 2017-08-30 ENCOUNTER — Encounter (HOSPITAL_COMMUNITY): Payer: Self-pay

## 2017-08-30 DIAGNOSIS — Y9389 Activity, other specified: Secondary | ICD-10-CM | POA: Insufficient documentation

## 2017-08-30 DIAGNOSIS — Z79899 Other long term (current) drug therapy: Secondary | ICD-10-CM | POA: Insufficient documentation

## 2017-08-30 DIAGNOSIS — Y92410 Unspecified street and highway as the place of occurrence of the external cause: Secondary | ICD-10-CM | POA: Diagnosis not present

## 2017-08-30 DIAGNOSIS — R102 Pelvic and perineal pain: Secondary | ICD-10-CM | POA: Diagnosis not present

## 2017-08-30 DIAGNOSIS — N939 Abnormal uterine and vaginal bleeding, unspecified: Secondary | ICD-10-CM | POA: Diagnosis not present

## 2017-08-30 DIAGNOSIS — Y999 Unspecified external cause status: Secondary | ICD-10-CM | POA: Insufficient documentation

## 2017-08-30 LAB — WET PREP, GENITAL
Clue Cells Wet Prep HPF POC: NONE SEEN
Sperm: NONE SEEN
TRICH WET PREP: NONE SEEN
WBC, Wet Prep HPF POC: NONE SEEN
Yeast Wet Prep HPF POC: NONE SEEN

## 2017-08-30 LAB — URINALYSIS, ROUTINE W REFLEX MICROSCOPIC
BILIRUBIN URINE: NEGATIVE
Glucose, UA: NEGATIVE mg/dL
Hgb urine dipstick: NEGATIVE
KETONES UR: NEGATIVE mg/dL
Leukocytes, UA: NEGATIVE
NITRITE: NEGATIVE
PROTEIN: NEGATIVE mg/dL
Specific Gravity, Urine: 1.028 (ref 1.005–1.030)
pH: 6 (ref 5.0–8.0)

## 2017-08-30 LAB — POC URINE PREG, ED: PREG TEST UR: NEGATIVE

## 2017-08-30 LAB — I-STAT BETA HCG BLOOD, ED (MC, WL, AP ONLY)

## 2017-08-30 MED ORDER — NAPROXEN 500 MG PO TABS: 500.0000 mg | ORAL_TABLET | Freq: Two times a day (BID) | ORAL | 0 refills | Status: DC
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
## 2017-08-30 NOTE — Discharge Instructions (Signed)
Naprosyn for pain. Follow up with your OB/GYN as needed.

## 2017-08-31 LAB — GC/CHLAMYDIA PROBE AMP (~~LOC~~) NOT AT ARMC
Chlamydia: NEGATIVE
NEISSERIA GONORRHEA: NEGATIVE

## 2017-09-15 ENCOUNTER — Emergency Department (HOSPITAL_COMMUNITY)
Admission: EM | Admit: 2017-09-15 | Discharge: 2017-09-16 | Disposition: A | Discharge status: Home / Self Care | Payer: Medicaid Other | Attending: Emergency Medicine | Admitting: Emergency Medicine

## 2017-09-15 ENCOUNTER — Emergency Department (HOSPITAL_COMMUNITY): Payer: Medicaid Other

## 2017-09-15 DIAGNOSIS — R1084 Generalized abdominal pain: Secondary | ICD-10-CM | POA: Insufficient documentation

## 2017-09-15 DIAGNOSIS — O26891 Other specified pregnancy related conditions, first trimester: Secondary | ICD-10-CM | POA: Insufficient documentation

## 2017-09-15 DIAGNOSIS — Z3A01 Less than 8 weeks gestation of pregnancy: Secondary | ICD-10-CM | POA: Insufficient documentation

## 2017-09-15 DIAGNOSIS — R102 Pelvic and perineal pain: Secondary | ICD-10-CM

## 2017-09-15 LAB — CBC
HEMATOCRIT: 39.7 % (ref 36.0–46.0)
HEMOGLOBIN: 12.9 g/dL (ref 12.0–15.0)
MCH: 25.2 pg — ABNORMAL LOW (ref 26.0–34.0)
MCHC: 32.5 g/dL (ref 30.0–36.0)
MCV: 77.5 fL — ABNORMAL LOW (ref 78.0–100.0)
Platelets: 252 10*3/uL (ref 150–400)
RBC: 5.12 MIL/uL — ABNORMAL HIGH (ref 3.87–5.11)
RDW: 14.9 % (ref 11.5–15.5)
WBC: 6.9 10*3/uL (ref 4.0–10.5)

## 2017-09-15 LAB — URINALYSIS, ROUTINE W REFLEX MICROSCOPIC
Bilirubin Urine: NEGATIVE
Glucose, UA: NEGATIVE mg/dL
Hgb urine dipstick: NEGATIVE
KETONES UR: NEGATIVE mg/dL
LEUKOCYTES UA: NEGATIVE
NITRITE: NEGATIVE
PH: 6 (ref 5.0–8.0)
Protein, ur: NEGATIVE mg/dL
SPECIFIC GRAVITY, URINE: 1.026 (ref 1.005–1.030)

## 2017-09-15 LAB — LIPASE, BLOOD: Lipase: 33 U/L (ref 11–51)

## 2017-09-15 LAB — COMPREHENSIVE METABOLIC PANEL
ALT: 14 U/L (ref 14–54)
ANION GAP: 8 (ref 5–15)
AST: 15 U/L (ref 15–41)
Albumin: 3.6 g/dL (ref 3.5–5.0)
Alkaline Phosphatase: 54 U/L (ref 38–126)
BILIRUBIN TOTAL: 0.2 mg/dL — AB (ref 0.3–1.2)
BUN: 9 mg/dL (ref 6–20)
CO2: 23 mmol/L (ref 22–32)
Calcium: 9.3 mg/dL (ref 8.9–10.3)
Chloride: 105 mmol/L (ref 101–111)
Creatinine, Ser: 1.06 mg/dL — ABNORMAL HIGH (ref 0.44–1.00)
GFR calc Af Amer: >60 mL/min (ref >60)
Glucose, Bld: 93 mg/dL (ref 65–99)
Potassium: 3.7 mmol/L (ref 3.5–5.1)
Sodium: 136 mmol/L (ref 135–145)
TOTAL PROTEIN: 7.4 g/dL (ref 6.5–8.1)

## 2017-09-15 LAB — I-STAT BETA HCG BLOOD, ED (MC, WL, AP ONLY)

## 2017-09-15 NOTE — ED Triage Notes (Addendum)
Patient reports positive pregnancy tests at home x3 weeks but [facility] States LMP 1/9. C/o abdominal cramping and back pain.

## 2017-09-15 NOTE — ED Provider Notes (Signed)
[facility]-EMERGENCY DEPT Provider Note   CSN: [id number] Arrival date & time: 09/15/17  1921     History   Chief Complaint No chief complaint on file.   HPI Julia Armstrong is a 27 y.o. female.  The history is provided by the patient and medical records.     27 year old G4 P3, presenting to the ED with abdominal cramping and back pain.  Patient reports this is been ongoing for about a week.  She was seen in the ED at [facility] recently and told that her pregnancy test was negative despite 3 home pregnancy tests that were positive.  She denies any vaginal bleeding, discharge, or urinary symptoms.  No nausea, vomiting, or diarrhea.  No fever or chills.  States usually she does not have any cramping or back pain with her pregnancies in the past and so she was just concerned.  She does have history of ectopic several years ago that was treated with oral medications.  States this feels vastly different.  States last menstrual period was sometime in January, cannot recall specific date.  Past Medical History:  Diagnosis Date  . Gallstones     There are no active problems to display for this patient.   Past Surgical History:  Procedure Laterality Date  . CESAREAN SECTION    . DILATION AND EVACUATION N/A 02/18/2017   Procedure: DILATATION AND EVACUATION;  Surgeon: Tereso NewcomerAnyanwu, Ugonna A, MD;  Location: [facility];  Service: Gynecology;  Laterality: N/A;    OB History    Gravida Para Term Preterm AB Living   5 3 3   2 3    SAB TAB Ectopic Multiple Live Births   2       3       Home Medications    Prior to Admission medications   Medication Sig Start Date End Date Taking? Authorizing Provider  cyclobenzaprine (FLEXERIL) 10 MG tablet Take 1 tablet (10 mg total) by mouth 3 (three) times daily as needed for muscle spasms. 03/05/17   Anyanwu, Jethro BastosUgonna A, MD  docusate sodium (COLACE) 100 MG capsule Take 1 capsule (100 mg total) by mouth 2 (two) times daily as needed.  02/18/17   Anyanwu, Jethro BastosUgonna A, MD  erythromycin ophthalmic ointment Place 1 application into both eyes every 6 (six) hours. Place 1/2 inch ribbon of ointment in the affected eye 4 times a day 03/15/17   Antony MaduraHumes, Kelly, PA-C  famotidine (PEPCID) 20 MG tablet Take 20 mg by mouth 2 (two) times daily as needed for heartburn or indigestion.  02/11/17   [provider]  ibuprofen (ADVIL,MOTRIN) 800 MG tablet Take 1 tablet (800 mg total) by mouth every 8 (eight) hours as needed. 03/15/17   Antony MaduraHumes, Kelly, PA-C  naproxen (NAPROSYN) 500 MG tablet Take 1 tablet (500 mg total) by mouth 2 (two) times daily. 08/30/17   Kirichenko, Lemont Fillersatyana, PA-C  oxyCODONE-acetaminophen (PERCOCET/ROXICET) 5-325 MG tablet Take 1-2 tablets by mouth every 6 (six) hours as needed. 03/05/17   Anyanwu, Jethro BastosUgonna A, MD  Prenatal Vit-Fe Fumarate-FA (PNV PRENATAL PLUS MULTIVITAMIN) 27-1 MG TABS Take 1 tablet by mouth daily. 01/30/17   [provider]    Family History Family History  Problem Relation Age of Onset  . Cancer Other     Social History Social History   Tobacco Use  . Smoking status: Never Smoker  . Smokeless tobacco: Never Used  Substance Use Topics  . Alcohol use: No  . Drug use: No  Allergies   Patient has no known allergies.   Review of Systems Review of Systems  Gastrointestinal: Positive for abdominal pain.  Musculoskeletal: Positive for back pain.  All other systems reviewed and are negative.    Physical Exam Updated Vital Signs BP 132/74 (BP Location: Left Arm)   Pulse 70   Temp 98.8 F (37.1 C) (Oral)   Resp 18   LMP 08/05/2017   SpO2 100%   Physical Exam  Constitutional: She is oriented to person, place, and time. She appears well-developed and well-nourished.  HENT:  Head: Normocephalic and atraumatic.  Mouth/Throat: Oropharynx is clear and moist.  Eyes: Conjunctivae and EOM are normal. Pupils are equal, round, and reactive to light.  Neck: Normal range of motion.    Cardiovascular: Normal rate, regular rhythm and normal heart sounds.  Pulmonary/Chest: Effort normal and breath sounds normal. No stridor. No respiratory distress.  Abdominal: Soft. Bowel sounds are normal. There is no tenderness. There is no rebound.  Soft, non-tender  Musculoskeletal: Normal range of motion.  Neurological: She is alert and oriented to person, place, and time.  Skin: Skin is warm and dry.  Psychiatric: She has a normal mood and affect.  Nursing note and vitals reviewed.    ED Treatments / Results  Labs (all labs ordered are listed, but only abnormal results are displayed) Labs Reviewed  COMPREHENSIVE METABOLIC PANEL - Abnormal; Notable for the following components:      Result Value   Creatinine, Ser 1.06 (*)    Total Bilirubin 0.2 (*)    All other components within normal limits  CBC - Abnormal; Notable for the following components:   RBC 5.12 (*)    MCV 77.5 (*)    MCH 25.2 (*)    All other components within normal limits  I-STAT BETA HCG BLOOD, ED ([facility], [facility], [facility] ONLY) - Abnormal; Notable for the following components:   I-stat hCG, quantitative >2,000.0 (*)    All other components within normal limits  LIPASE, BLOOD  URINALYSIS, ROUTINE W REFLEX MICROSCOPIC  HCG, QUANTITATIVE, PREGNANCY    EKG  EKG Interpretation None       Radiology US Ob Less Than 14 Weeks With Ob Transvaginal  Result Date: 09/16/2017 CLINICAL DATA:  Cramping EXAM: OBSTETRIC <14 WK [place] AND TRANSVAGINAL OB US TECHNIQUE: Both transabdominal and transvaginal ultrasound examinations were performed for complete evaluation of the gestation as well as the maternal uterus, adnexal regions, and pelvic cul-de-sac. Transvaginal technique was performed to assess early pregnancy. COMPARISON:  None. FINDINGS: Intrauterine gestational sac: Single intrauterine gestational sac Yolk sac:  Visible Embryo:  Not visible MSD: 7.7 mm   5 w   4 d Subchorionic hemorrhage:  None visualized. Maternal  uterus/adnexae: Left ovary is non visualized. Right ovary within normal limits and measures 2.4 x 1.6 x 1.3 cm. No significant free fluid IMPRESSION: 1. Single intrauterine pregnancy with visible gestational sac and yolk sac but no embryo. Consider follow-up sonogram in 10-14 days to confirm viability 2. Nonvisualization of the left ovary Electronically Signed   By: Jasmine Pang M.D.   On: 09/16/2017 00:06    Procedures Procedures (including critical care time)  Medications Ordered in ED Medications - No data to display   Initial Impression / Assessment and Plan / ED Course  I have reviewed the triage vital signs and the nursing notes.  Pertinent labs & imaging results that were available during my care of the patient were reviewed by me and considered in my medical  decision making (see chart for details).  27 year old G4 P3 unknown gestation, presenting to the ED with lower abdominal cramping and back pain.  She is found to be pregnant today.  Screening labs are otherwise reassuring.  She had recent pelvic exam earlier in the month that was negative for STDs.  She denies any new sexual encounter since then.  No new discharge, vaginal bleeding, urinary symptoms, etc.  Will obtain ultrasound to establish IUP.  Ultrasound was single IUP with visible gestational sac and yolk sac but no embryo.  She is to have follow-up ultrasound in 10-14 days to confirm viability.  I discussed the results with her and discussed importance of follow-up.  Will start prenatal vitamins.  Advised she can take Tylenol for pain, avoid Aleve, Motrin, and other NSAIDs.  She will call in the morning to schedule follow-up.  She understands to return sooner for any new or worsening symptoms including vaginal bleeding, severe pain, etc.  Final Clinical Impressions(s) / ED Diagnoses   Final diagnoses:  Less than [redacted] weeks gestation of pregnancy    ED Discharge Orders        Ordered    Prenatal Vit-Fe Fumarate-FA (PRENATAL  COMPLETE) 14-0.4 MG TABS  Daily     09/16/17 0101       Garlon Hatchet, PA-C 09/16/17 Nydia Bouton    Azalia Bilis, MD 09/16/17 [phone]

## 2017-09-16 LAB — HCG, QUANTITATIVE, PREGNANCY: HCG, BETA CHAIN, QUANT, S: 14010 m[IU]/mL — AB (ref <5)

## 2017-09-16 MED ORDER — PRENATAL COMPLETE 14-0.4 MG PO TABS
1.0000 | ORAL_TABLET | Freq: Every day | ORAL | 0 refills | Status: DC
Start: 2017-09-16 — End: 2017-11-03

## 2017-09-16 NOTE — Discharge Instructions (Signed)
Start taking prenatals as directed.  You can take Tylenol for pain, do not take Motrin, Aleve, or other NSAIDs. Follow-up with women's clinic in 10-14 days for repeat ultrasound.  Recommend to call in the morning and schedule an appointment for this. Return to the ED for new or worsening symptoms.

## 2017-10-01 ENCOUNTER — Other Ambulatory Visit: Payer: Self-pay

## 2017-10-01 ENCOUNTER — Encounter (HOSPITAL_COMMUNITY): Payer: Self-pay | Admitting: *Deleted

## 2017-10-01 ENCOUNTER — Inpatient Hospital Stay (HOSPITAL_COMMUNITY): Payer: Medicaid Other

## 2017-10-01 ENCOUNTER — Inpatient Hospital Stay (HOSPITAL_COMMUNITY)
Admission: AD | Admit: 2017-10-01 | Discharge: 2017-10-01 | Disposition: A | Discharge status: Home / Self Care | Payer: Medicaid Other | Source: Ambulatory Visit | Attending: Obstetrics & Gynecology | Admitting: Obstetrics & Gynecology

## 2017-10-01 DIAGNOSIS — O219 Vomiting of pregnancy, unspecified: Secondary | ICD-10-CM

## 2017-10-01 DIAGNOSIS — R109 Unspecified abdominal pain: Secondary | ICD-10-CM

## 2017-10-01 DIAGNOSIS — Z3A08 8 weeks gestation of pregnancy: Secondary | ICD-10-CM | POA: Diagnosis not present

## 2017-10-01 DIAGNOSIS — Z3A01 Less than 8 weeks gestation of pregnancy: Secondary | ICD-10-CM | POA: Diagnosis not present

## 2017-10-01 DIAGNOSIS — O26891 Other specified pregnancy related conditions, first trimester: Secondary | ICD-10-CM | POA: Insufficient documentation

## 2017-10-01 DIAGNOSIS — O26899 Other specified pregnancy related conditions, unspecified trimester: Secondary | ICD-10-CM

## 2017-10-01 DIAGNOSIS — O23591 Infection of other part of genital tract in pregnancy, first trimester: Secondary | ICD-10-CM | POA: Diagnosis not present

## 2017-10-01 DIAGNOSIS — Z3490 Encounter for supervision of normal pregnancy, unspecified, unspecified trimester: Secondary | ICD-10-CM

## 2017-10-01 DIAGNOSIS — Z331 Pregnant state, incidental: Secondary | ICD-10-CM

## 2017-10-01 DIAGNOSIS — B9689 Other specified bacterial agents as the cause of diseases classified elsewhere: Secondary | ICD-10-CM

## 2017-10-01 DIAGNOSIS — N760 Acute vaginitis: Secondary | ICD-10-CM

## 2017-10-01 DIAGNOSIS — Z79899 Other long term (current) drug therapy: Secondary | ICD-10-CM | POA: Diagnosis not present

## 2017-10-01 LAB — WET PREP, GENITAL
Sperm: NONE SEEN
TRICH WET PREP: NONE SEEN
Yeast Wet Prep HPF POC: NONE SEEN

## 2017-10-01 LAB — URINALYSIS, ROUTINE W REFLEX MICROSCOPIC
BILIRUBIN URINE: NEGATIVE
Glucose, UA: NEGATIVE mg/dL
HGB URINE DIPSTICK: NEGATIVE
KETONES UR: NEGATIVE mg/dL
Leukocytes, UA: NEGATIVE
Nitrite: NEGATIVE
Protein, ur: NEGATIVE mg/dL
SPECIFIC GRAVITY, URINE: 1.02 (ref 1.005–1.030)
pH: 6 (ref 5.0–8.0)

## 2017-10-01 LAB — CBC
HCT: 38.2 % (ref 36.0–46.0)
Hemoglobin: 12.7 g/dL (ref 12.0–15.0)
MCH: 25.3 pg — ABNORMAL LOW (ref 26.0–34.0)
MCHC: 33.2 g/dL (ref 30.0–36.0)
MCV: 76.1 fL — AB (ref 78.0–100.0)
PLATELETS: 276 10*3/uL (ref 150–400)
RBC: 5.02 MIL/uL (ref 3.87–5.11)
RDW: 15 % (ref 11.5–15.5)
WBC: 8.3 10*3/uL (ref 4.0–10.5)

## 2017-10-01 LAB — HCG, QUANTITATIVE, PREGNANCY: hCG, Beta Chain, Quant, S: 114739 m[IU]/mL — ABNORMAL HIGH (ref <5)

## 2017-10-01 MED ORDER — PROMETHAZINE HCL 25 MG PO TABS: 25.0000 mg | ORAL_TABLET | Freq: Four times a day (QID) | ORAL | 0 refills | Status: DC | PRN

## 2017-10-01 MED ORDER — METRONIDAZOLE 0.75 % VA GEL: 1.0000 | Freq: Two times a day (BID) | VAGINAL | 0 refills | Status: DC

## 2017-10-01 MED ORDER — PROMETHAZINE HCL 25 MG/ML IJ SOLN
25.0000 mg | Freq: Once | INTRAMUSCULAR | Status: AC
  Administered 2017-10-01: 25 mg via INTRAMUSCULAR
  Filled 2017-10-01: qty 1

## 2017-10-01 NOTE — MAU Note (Signed)
PT SAYS  SHE WENT  TO [facility] ON 2-19-   FOR  SPOTTING-  FOUND OUT  PREG.   DID LABS.    THEN WENT TO DR  NEAR  [place] - FOR SPOTTING-  VE-  GAVE AN APPOINTMENT  -  IN 2 WEEKS .   NO SPOTTING  TONIGHT .   FEELS CRAMPS  SINCE FOUND OUT PREG.       VOMITING STARTED  LAST WED.

## 2017-10-01 NOTE — MAU Provider Note (Signed)
History     CSN: [id number]  Arrival date and time: 10/01/17 1900   First Provider Initiated Contact with Patient 10/01/17 2103      Chief Complaint  Patient presents with  . Emesis   HPI  Julia Armstrong is a 27 y.o. [id number] at [redacted]w[redacted]d who presents with abdominal cramping & n/v. Symptoms began 2 weeks ago. Reports intermittent lower abdominal cramping. Rates pain 7/10. Has not treated symptoms. Denies dysuria, vaginal bleeding, diarrhea, or fever/chills. Endorses some malodorous vaginal discharge since yesterday.  Nausea & vomiting for the last week. Reports feeling nauseated daily. Has not vomited today. Does not have antiemetic at home. Has new ob visit scheduled at [facility] next month.  OB History    Gravida Para Term Preterm AB Living   6 3 3   2 3    SAB TAB Ectopic Multiple Live Births   2       3      Past Medical History:  Diagnosis Date  . Gallstones     Past Surgical History:  Procedure Laterality Date  . CESAREAN SECTION    . DILATION AND EVACUATION N/A 02/18/2017   Procedure: DILATATION AND EVACUATION;  Surgeon: Tereso Newcomer, MD;  Location: [facility];  Service: Gynecology;  Laterality: N/A;    Family History  Problem Relation Age of Onset  . Cancer Other     Social History   Tobacco Use  . Smoking status: Never Smoker  . Smokeless tobacco: Never Used  Substance Use Topics  . Alcohol use: No  . Drug use: No    Allergies: No Known Allergies  Medications Prior to Admission  Medication Sig Dispense Refill Last Dose  . cyclobenzaprine (FLEXERIL) 10 MG tablet Take 1 tablet (10 mg total) by mouth 3 (three) times daily as needed for muscle spasms. (Patient not taking: Reported on 09/16/2017) 30 tablet 3 Not Taking at Unknown time  . docusate sodium (COLACE) 100 MG capsule Take 1 capsule (100 mg total) by mouth 2 (two) times daily as needed. (Patient not taking: Reported on 09/16/2017) 30 capsule 2 Not Taking at Unknown time  . erythromycin ophthalmic ointment  Place 1 application into both eyes every 6 (six) hours. Place 1/2 inch ribbon of ointment in the affected eye 4 times a day (Patient not taking: Reported on 09/16/2017) 1 g 1 Not Taking at Unknown time  . ibuprofen (ADVIL,MOTRIN) 800 MG tablet Take 1 tablet (800 mg total) by mouth every 8 (eight) hours as needed. (Patient not taking: Reported on 09/16/2017) 30 tablet 0 Not Taking at Unknown time  . naproxen (NAPROSYN) 500 MG tablet Take 1 tablet (500 mg total) by mouth 2 (two) times daily. (Patient not taking: Reported on 09/16/2017) 30 tablet 0 Not Taking at Unknown time  . oxyCODONE-acetaminophen (PERCOCET/ROXICET) 5-325 MG tablet Take 1-2 tablets by mouth every 6 (six) hours as needed. (Patient not taking: Reported on 09/16/2017) 30 tablet 0 Not Taking at Unknown time  . Prenatal Vit-Fe Fumarate-FA (PRENATAL COMPLETE) 14-0.4 MG TABS Take 1 tablet by mouth daily. 60 each 0     Review of Systems  Constitutional: Negative.   Gastrointestinal: Positive for abdominal pain, nausea and vomiting. Negative for constipation and diarrhea.  Genitourinary: Negative.    Physical Exam   Blood pressure 125/61, pulse 72, temperature 98.5 F (36.9 C), temperature source Oral, resp. rate 20, height 5\' 7"  (1.702 m), weight 206 lb 4 oz (93.6 kg), last menstrual period 08/05/2017.  Physical Exam  Nursing note and  vitals reviewed. Constitutional: She is oriented to person, place, and time. She appears well-developed and well-nourished. No distress.  HENT:  Head: Normocephalic and atraumatic.  Eyes: Conjunctivae are normal. Right eye exhibits no discharge. Left eye exhibits no discharge. No scleral icterus.  Neck: Normal range of motion.  Respiratory: Effort normal. No respiratory distress.  Neurological: She is alert and oriented to person, place, and time.  Skin: Skin is warm and dry. She is not diaphoretic.  Psychiatric: She has a normal mood and affect. Her behavior is normal. Judgment and thought content  normal.    MAU Course  Procedures Results for orders placed or performed during the hospital encounter of 10/01/17 (from the past 24 hour(s))  Urinalysis, Routine w reflex microscopic     Status: Abnormal   Collection Time: 10/01/17  7:30 PM  Result Value Ref Range   Color, Urine YELLOW YELLOW   APPearance HAZY (A) CLEAR   Specific Gravity, Urine 1.020 1.005 - 1.030   pH 6.0 5.0 - 8.0   Glucose, UA NEGATIVE NEGATIVE mg/dL   Hgb urine dipstick NEGATIVE NEGATIVE   Bilirubin Urine NEGATIVE NEGATIVE   Ketones, ur NEGATIVE NEGATIVE mg/dL   Protein, ur NEGATIVE NEGATIVE mg/dL   Nitrite NEGATIVE NEGATIVE   Leukocytes, UA NEGATIVE NEGATIVE  CBC     Status: Abnormal   Collection Time: 10/01/17  8:02 PM  Result Value Ref Range   WBC 8.3 4.0 - 10.5 K/uL   RBC 5.02 3.87 - 5.11 MIL/uL   Hemoglobin 12.7 12.0 - 15.0 g/dL   HCT 16.1 09.6 - 04.5 %   MCV 76.1 (L) 78.0 - 100.0 fL   MCH 25.3 (L) 26.0 - 34.0 pg   MCHC 33.2 30.0 - 36.0 g/dL   RDW 40.9 81.1 - 91.4 %   Platelets 276 150 - 400 K/uL  hCG, quantitative, pregnancy     Status: Abnormal   Collection Time: 10/01/17  8:02 PM  Result Value Ref Range   hCG, Beta Chain, Quant, S 114,739 (H) <5 mIU/mL  Wet prep, genital     Status: Abnormal   Collection Time: 10/01/17  9:20 PM  Result Value Ref Range   Yeast Wet Prep HPF POC NONE SEEN NONE SEEN   Trich, Wet Prep NONE SEEN NONE SEEN   Clue Cells Wet Prep HPF POC PRESENT (A) NONE SEEN   WBC, Wet Prep HPF POC FEW (A) NONE SEEN   Sperm NONE SEEN    US Ob Transvaginal  Result Date: 10/01/2017 CLINICAL DATA:  27 y/o  F; cramping mid abdominal pain for 2 weeks. EXAM: OBSTETRIC <14 WK [place] AND TRANSVAGINAL OB US TECHNIQUE: Both transabdominal and transvaginal ultrasound examinations were performed for complete evaluation of the gestation as well as the maternal uterus, adnexal regions, and pelvic cul-de-sac. Transvaginal technique was performed to assess early pregnancy. COMPARISON:  07/23/2017 CT  abdomen and pelvis. FINDINGS: Intrauterine gestational sac: Single Yolk sac:  Visualized. Embryo:  Visualized. Cardiac Activity: Visualized. Heart Rate: 146 bpm CRL:  13.4 mm mm   7 w   4 d                  [place] EDC: 05/16/2018 Subchorionic hemorrhage:  None visualized. Maternal uterus/adnexae: Normal. IMPRESSION: Single live intrauterine pregnancy. Estimated gestational age [redacted] weeks 4 days. Electronically Signed   By: Mitzi Hansen M.D.   On: 10/01/2017 20:51    MDM Ultrasound today shows SIUP with cardiac activity Phenergan 25 mg IM GC/CT & wet prep  collected --- negative swabs last month, would like to be rechecked  Assessment and Plan  A: 1. IUP (intrauterine pregnancy), incidental   2. Abdominal pain affecting pregnancy   3. [redacted] weeks gestation of pregnancy   4. Nausea and vomiting during pregnancy prior to [redacted] weeks gestation   5. BV (bacterial vaginosis)    P: Discharge home Rx flagyl & phenergan Discussed reasons to return to MAU Keep follow up appointment with OB/PCP  GC/CT pending   Judeth Hornrin Glendi Mohiuddin 10/01/2017, 9:11 PM

## 2017-10-01 NOTE — Discharge Instructions (Signed)
Bacterial Vaginosis Bacterial vaginosis is a vaginal infection that occurs when the normal balance of bacteria in the vagina is disrupted. It results from an overgrowth of certain bacteria. This is the most common vaginal infection among women ages 15-44. Because bacterial vaginosis increases your risk for STIs (sexually transmitted infections), getting treated can help reduce your risk for chlamydia, gonorrhea, herpes, and HIV (human immunodeficiency virus). Treatment is also important for preventing complications in pregnant women, because this condition can cause an early (premature) delivery. What are the causes? This condition is caused by an increase in harmful bacteria that are normally present in small amounts in the vagina. However, the reason that the condition develops is not fully understood. What increases the risk? The following factors may make you more likely to develop this condition:  Having a new sexual partner or multiple sexual partners.  Having unprotected sex.  Douching.  Having an intrauterine device (IUD).  Smoking.  Drug and alcohol abuse.  Taking certain antibiotic medicines.  Being pregnant.  You cannot get bacterial vaginosis from toilet seats, bedding, swimming pools, or contact with objects around you. What are the signs or symptoms? Symptoms of this condition include:  Grey or white vaginal discharge. The discharge can also be watery or foamy.  A fish-like odor with discharge, especially after sexual intercourse or during menstruation.  Itching in and around the vagina.  Burning or pain with urination.  Some women with bacterial vaginosis have no signs or symptoms. How is this diagnosed? This condition is diagnosed based on:  Your medical history.  A physical exam of the vagina.  Testing a sample of vaginal fluid under a microscope to look for a large amount of bad bacteria or abnormal cells. Your health care provider may use a cotton swab  or a small wooden spatula to collect the sample.  How is this treated? This condition is treated with antibiotics. These may be given as a pill, a vaginal cream, or a medicine that is put into the vagina (suppository). If the condition comes back after treatment, a second round of antibiotics may be needed. Follow these instructions at home: Medicines  Take over-the-counter and prescription medicines only as told by your health care provider.  Take or use your antibiotic as told by your health care provider. Do not stop taking or using the antibiotic even if you start to feel better. General instructions  If you have a female sexual partner, tell her that you have a vaginal infection. She should see her health care provider and be treated if she has symptoms. If you have a female sexual partner, he does not need treatment.  During treatment: ? Avoid sexual activity until you finish treatment. ? Do not douche. ? Avoid alcohol as directed by your health care provider. ? Avoid breastfeeding as directed by your health care provider.  Drink enough water and fluids to keep your urine clear or pale yellow.  Keep the area around your vagina and rectum clean. ? Wash the area daily with warm water. ? Wipe yourself from front to back after using the toilet.  Keep all follow-up visits as told by your health care provider. This is important. How is this prevented?  Do not douche.  Wash the outside of your vagina with warm water only.  Use protection when having sex. This includes latex condoms and dental dams.  Limit how many sexual partners you have. To help prevent bacterial vaginosis, it is best to have sex with just   one partner (monogamous).  Make sure you and your sexual partner are tested for STIs.  Wear cotton or cotton-lined underwear.  Avoid wearing tight pants and pantyhose, especially during summer.  Limit the amount of alcohol that you drink.  Do not use any products that  contain nicotine or tobacco, such as cigarettes and e-cigarettes. If you need help quitting, ask your health care provider.  Do not use illegal drugs. Where to find more information:  Centers for Disease Control and Prevention: www.cdc.gov/std  American Sexual Health Association (ASHA): www.ashastd.org  U.S. Department of Health and Human Services, Office on Women's Health: www.womenshealth.gov/ or https://www.womenshealth.gov/a-z-topics/bacterial-vaginosis Contact a health care provider if:  Your symptoms do not improve, even after treatment.  You have more discharge or pain when urinating.  You have a fever.  You have pain in your abdomen.  You have pain during sex.  You have vaginal bleeding between periods. Summary  Bacterial vaginosis is a vaginal infection that occurs when the normal balance of bacteria in the vagina is disrupted.  Because bacterial vaginosis increases your risk for STIs (sexually transmitted infections), getting treated can help reduce your risk for chlamydia, gonorrhea, herpes, and HIV (human immunodeficiency virus). Treatment is also important for preventing complications in pregnant women, because the condition can cause an early (premature) delivery.  This condition is treated with antibiotic medicines. These may be given as a pill, a vaginal cream, or a medicine that is put into the vagina (suppository). This information is not intended to replace advice given to you by your health care provider. Make sure you discuss any questions you have with your health care provider. Document Released: 07/14/2005 Document Revised: 11/17/2016 Document Reviewed: 03/29/2016 Elsevier Interactive Patient Education  2018 [employer] Inc.  

## 2017-10-03 LAB — GC/CHLAMYDIA PROBE AMP (~~LOC~~) NOT AT ARMC
Chlamydia: NEGATIVE
NEISSERIA GONORRHEA: NEGATIVE

## 2017-10-21 ENCOUNTER — Encounter (HOSPITAL_COMMUNITY): Payer: Self-pay | Admitting: *Deleted

## 2017-10-21 ENCOUNTER — Inpatient Hospital Stay (HOSPITAL_COMMUNITY)
Admission: AD | Admit: 2017-10-21 | Discharge: 2017-10-21 | Disposition: A | Discharge status: Home / Self Care | Payer: Medicaid Other | Source: Ambulatory Visit | Attending: Obstetrics & Gynecology | Admitting: Obstetrics & Gynecology

## 2017-10-21 DIAGNOSIS — S39011A Strain of muscle, fascia and tendon of abdomen, initial encounter: Secondary | ICD-10-CM | POA: Diagnosis not present

## 2017-10-21 DIAGNOSIS — Z3A11 11 weeks gestation of pregnancy: Secondary | ICD-10-CM | POA: Diagnosis not present

## 2017-10-21 DIAGNOSIS — O26891 Other specified pregnancy related conditions, first trimester: Secondary | ICD-10-CM | POA: Diagnosis not present

## 2017-10-21 DIAGNOSIS — R109 Unspecified abdominal pain: Secondary | ICD-10-CM

## 2017-10-21 LAB — URINALYSIS, ROUTINE W REFLEX MICROSCOPIC
Bilirubin Urine: NEGATIVE
Glucose, UA: NEGATIVE mg/dL
Hgb urine dipstick: NEGATIVE
KETONES UR: NEGATIVE mg/dL
LEUKOCYTES UA: NEGATIVE
NITRITE: NEGATIVE
PROTEIN: NEGATIVE mg/dL
Specific Gravity, Urine: 1.027 (ref 1.005–1.030)
pH: 6 (ref 5.0–8.0)

## 2017-10-21 NOTE — Discharge Instructions (Signed)
Muscle Strain A muscle strain is an injury that occurs when a muscle is stretched beyond its normal length. Usually a small number of muscle fibers are torn when this happens. Muscle strain is rated in degrees. First-degree strains have the least amount of muscle fiber tearing and pain. Second-degree and third-degree strains have increasingly more tearing and pain. Usually, recovery from muscle strain takes 1-2 weeks. Complete healing takes 5-6 weeks. What are the causes? Muscle strain happens when a sudden, violent force placed on a muscle stretches it too far. This may occur with lifting, sports, or a fall. What increases the risk? Muscle strain is especially common in athletes. What are the signs or symptoms? At the site of the muscle strain, there may be:  Pain.  Bruising.  Swelling.  Difficulty using the muscle due to pain or lack of normal function.  How is this diagnosed? Your health care provider will perform a physical exam and ask about your medical history. How is this treated? Often, the best treatment for a muscle strain is resting, icing, and applying cold compresses to the injured area. Follow these instructions at home:  Use the PRICE method of treatment to promote muscle healing during the first 2-3 days after your injury. The PRICE method involves: ? Protecting the muscle from being injured again. ? Restricting your activity and resting the injured body part. ? Icing your injury. To do this, put ice in a plastic bag. Place a towel between your skin and the bag. Then, apply the ice and leave it on from 15-20 minutes each hour. After the third day, switch to moist heat packs.  Only take over-the-counter or prescription medicines for pain, discomfort, or fever as directed by your health care provider.  Warming up prior to exercise helps to prevent future muscle strains. Contact a health care provider if:  You have increasing pain or swelling in the injured  area.  You have numbness, tingling, or a significant loss of strength in the injured area. This information is not intended to replace advice given to you by your health care provider. Make sure you discuss any questions you have with your health care provider. Document Released: 07/14/2005 Document Revised: 12/20/2015 Document Reviewed: 02/10/2013 Elsevier Interactive Patient Education  2017 [employer]

## 2017-10-21 NOTE — MAU Note (Signed)
Pt states since last she has been having upper/mid abdominal pain. Pt states she works at [employer] and does a lot of heavy lifting. Rates 8/10. States she has not taken anything for pain. Reports a lot of nausea and vomiting but this is not new-states she last took her medication prior to coming here.

## 2017-10-21 NOTE — MAU Provider Note (Addendum)
Chief Complaint: Abdominal Pain   First Provider Initiated Contact with Patient 10/21/17 2123        SUBJECTIVE HPI: Julia Armstrong is a 27 y.o. [id number] at [redacted]w[redacted]d by LMP who presents to maternity admissions reporting upper abdominal pain since lifting boxes at [employer] workplace last night. States had to go home due to discomfort.  Is trying to get transferred to the "light duty loading" but was told she needed a doctor's note first.  Also requests a work note for [employer].  No lower pelvic pain. No bleeding. Has New OB appt on 11/03/17.. She denies vaginal bleeding, vaginal itching/burning, urinary symptoms, h/a, dizziness, n/v, or fever/chills.     Abdominal Pain  This is a new problem. The current episode started yesterday. The onset quality is sudden. The problem occurs constantly. The problem has been unchanged. The pain is located in the epigastric region. The quality of the pain is cramping and aching. The abdominal pain does not radiate. Pertinent negatives include no constipation, diarrhea, dysuria, fever, nausea or vomiting. The pain is aggravated by certain positions and movement. The pain is relieved by nothing. She has tried nothing for the symptoms.   RN note: Pt states since last she has been having upper/mid abdominal pain. Pt states she works at [employer] and does a lot of heavy lifting. Rates 8/10. States she has not taken anything for pain. Reports a lot of nausea and vomiting but this is not new-states she last took her medication prior to coming here    Past Medical History:  Diagnosis Date  . Gallstones    Past Surgical History:  Procedure Laterality Date  . CESAREAN SECTION    . DILATION AND EVACUATION N/A 02/18/2017   Procedure: DILATATION AND EVACUATION;  Surgeon: Tereso Newcomer, MD;  Location: [facility];  Service: Gynecology;  Laterality: N/A;   Social History   Socioeconomic History  . Marital status: Single    Spouse name: Not on file  . Number of children: Not on file   . Years of education: Not on file  . Highest education level: Not on file  Occupational History  . Not on file  Social Needs  . Financial resource strain: Not on file  . Food insecurity:    Worry: Not on file    Inability: Not on file  . Transportation needs:    Medical: Not on file    Non-medical: Not on file  Tobacco Use  . Smoking status: Never Smoker  . Smokeless tobacco: Never Used  Substance and Sexual Activity  . Alcohol use: No  . Drug use: No  . Sexual activity: Not Currently    Birth control/protection: None    Comment: Patient does not want birth control  Lifestyle  . Physical activity:    Days per week: Not on file    Minutes per session: Not on file  . Stress: Not on file  Relationships  . Social connections:    Talks on phone: Not on file    Gets together: Not on file    Attends religious service: Not on file    Active member of club or organization: Not on file    Attends meetings of clubs or organizations: Not on file    Relationship status: Not on file  . Intimate partner violence:    Fear of current or ex partner: Not on file    Emotionally abused: Not on file    Physically abused: Not on file    Forced  sexual activity: Not on file  Other Topics Concern  . Not on file  Social History Narrative  . Not on file   No current facility-administered medications on file prior to encounter.    Current Outpatient Medications on File Prior to Encounter  Medication Sig Dispense Refill  . metroNIDAZOLE (METROGEL VAGINAL) 0.75 % vaginal gel Place 1 Applicatorful vaginally 2 (two) times daily. 70 g 0  . Prenatal Vit-Fe Fumarate-FA (PRENATAL COMPLETE) 14-0.4 MG TABS Take 1 tablet by mouth daily. 60 each 0  . promethazine (PHENERGAN) 25 MG tablet Take 1 tablet (25 mg total) by mouth every 6 (six) hours as needed for nausea or vomiting. 30 tablet 0  . cyclobenzaprine (FLEXERIL) 10 MG tablet Take 1 tablet (10 mg total) by mouth 3 (three) times daily as needed for  muscle spasms. (Patient not taking: Reported on 09/16/2017) 30 tablet 3  . docusate sodium (COLACE) 100 MG capsule Take 1 capsule (100 mg total) by mouth 2 (two) times daily as needed. (Patient not taking: Reported on 09/16/2017) 30 capsule 2  . erythromycin ophthalmic ointment Place 1 application into both eyes every 6 (six) hours. Place 1/2 inch ribbon of ointment in the affected eye 4 times a day (Patient not taking: Reported on 09/16/2017) 1 g 1  . ibuprofen (ADVIL,MOTRIN) 800 MG tablet Take 1 tablet (800 mg total) by mouth every 8 (eight) hours as needed. (Patient not taking: Reported on 09/16/2017) 30 tablet 0  . naproxen (NAPROSYN) 500 MG tablet Take 1 tablet (500 mg total) by mouth 2 (two) times daily. (Patient not taking: Reported on 09/16/2017) 30 tablet 0  . oxyCODONE-acetaminophen (PERCOCET/ROXICET) 5-325 MG tablet Take 1-2 tablets by mouth every 6 (six) hours as needed. (Patient not taking: Reported on 09/16/2017) 30 tablet 0   No Known Allergies  I have reviewed patient's Past Medical Hx, Surgical Hx, Family Hx, Social Hx, medications and allergies.   ROS:  Review of Systems  Constitutional: Negative for fever.  Gastrointestinal: Positive for abdominal pain. Negative for constipation, diarrhea, nausea and vomiting.  Genitourinary: Negative for dysuria.   Review of Systems  Other systems negative   Physical Exam  Physical Exam Patient Vitals for the past 24 hrs:  BP Temp Temp src Pulse Resp SpO2  10/21/17 2042 110/77 98.6 F (37 C) Oral 78 18 100 %   Constitutional: Well-developed, well-nourished female in no acute distress.  Cardiovascular: normal rate Respiratory: normal effort GI: Abd soft, States upper abdomen is tender but does not react with palpation.   Pos BS x 4 MS: Extremities nontender, no edema, normal ROM Neurologic: Alert and oriented x 4.  GU: Neg CVAT.  PELVIC EXAM: Deferred RN unable to hear fetal heart tones with doppler FHT 160s by bedside US, fetus  very active by US, gestational sac visible, yolk sac not visible.   LAB RESULTS Results for orders placed or performed during the hospital encounter of 10/21/17 (from the past 24 hour(s))  Urinalysis, Routine w reflex microscopic     Status: None   Collection Time: 10/21/17  8:56 PM  Result Value Ref Range   Color, Urine YELLOW YELLOW   APPearance CLEAR CLEAR   Specific Gravity, Urine 1.027 1.005 - 1.030   pH 6.0 5.0 - 8.0   Glucose, UA NEGATIVE NEGATIVE mg/dL   Hgb urine dipstick NEGATIVE NEGATIVE   Bilirubin Urine NEGATIVE NEGATIVE   Ketones, ur NEGATIVE NEGATIVE mg/dL   Protein, ur NEGATIVE NEGATIVE mg/dL   Nitrite NEGATIVE NEGATIVE  Leukocytes, UA NEGATIVE NEGATIVE    --/--/AB POS (07/02 0120)  IMAGING US Ob Transvaginal  Result Date: 10/01/2017 CLINICAL DATA:  27 y/o  F; cramping mid abdominal pain for 2 weeks. EXAM: OBSTETRIC <14 WK [place] AND TRANSVAGINAL OB US TECHNIQUE: Both transabdominal and transvaginal ultrasound examinations were performed for complete evaluation of the gestation as well as the maternal uterus, adnexal regions, and pelvic cul-de-sac. Transvaginal technique was performed to assess early pregnancy. COMPARISON:  07/23/2017 CT abdomen and pelvis. FINDINGS: Intrauterine gestational sac: Single Yolk sac:  Visualized. Embryo:  Visualized. Cardiac Activity: Visualized. Heart Rate: 146 bpm CRL:  13.4 mm mm   7 w   4 d                  [place] EDC: 05/16/2018 Subchorionic hemorrhage:  None visualized. Maternal uterus/adnexae: Normal. IMPRESSION: Single live intrauterine pregnancy. Estimated gestational age [redacted] weeks 4 days. Electronically Signed   By: Mitzi Hansen M.D.   On: 10/01/2017 20:51    MAU Management/MDM: Discussed likely source of pain is muscle strain from heavy lifting Reviewed lifting restrictions recommended are less than 25lbs Note provided Note for out of work tonight provided Tylenol and heat for pain  ASSESSMENT [place] IUP at [redacted]w[redacted]d Upper  abdominal muscle strain  PLAN Discharge home Heat and tylenol for pain Note provided for work lifting restrictions Keep appt for clinic new ob appt  Pt stable at time of discharge. Encouraged to return here or to other Urgent Care/ED if she develops worsening of symptoms, increase in pain, fever, or other concerning symptoms.    Wynelle Bourgeois CNM, MSN Certified Nurse-Midwife 10/21/2017  9:23 PM

## 2017-11-03 ENCOUNTER — Encounter: Payer: Self-pay | Admitting: Family Medicine

## 2017-11-03 ENCOUNTER — Other Ambulatory Visit: Payer: Self-pay

## 2017-11-03 ENCOUNTER — Other Ambulatory Visit (HOSPITAL_COMMUNITY)
Admission: RE | Admit: 2017-11-03 | Discharge: 2017-11-03 | Disposition: A | Discharge status: Home / Self Care | Payer: Medicaid Other | Source: Ambulatory Visit | Attending: Certified Nurse Midwife | Admitting: Certified Nurse Midwife

## 2017-11-03 ENCOUNTER — Ambulatory Visit (INDEPENDENT_AMBULATORY_CARE_PROVIDER_SITE_OTHER): Payer: Medicaid Other | Admitting: Certified Nurse Midwife

## 2017-11-03 ENCOUNTER — Encounter: Payer: Self-pay | Admitting: Certified Nurse Midwife

## 2017-11-03 VITALS — BP 117/63 | HR 70 | Wt 204.5 lb

## 2017-11-03 DIAGNOSIS — Z3480 Encounter for supervision of other normal pregnancy, unspecified trimester: Secondary | ICD-10-CM | POA: Insufficient documentation

## 2017-11-03 DIAGNOSIS — Z8659 Personal history of other mental and behavioral disorders: Secondary | ICD-10-CM

## 2017-11-03 DIAGNOSIS — O219 Vomiting of pregnancy, unspecified: Secondary | ICD-10-CM

## 2017-11-03 DIAGNOSIS — O34219 Maternal care for unspecified type scar from previous cesarean delivery: Secondary | ICD-10-CM | POA: Insufficient documentation

## 2017-11-03 DIAGNOSIS — Z029 Encounter for administrative examinations, unspecified: Secondary | ICD-10-CM

## 2017-11-03 DIAGNOSIS — Z3481 Encounter for supervision of other normal pregnancy, first trimester: Secondary | ICD-10-CM

## 2017-11-03 LAB — POCT URINALYSIS DIP (DEVICE)
Glucose, UA: NEGATIVE mg/dL
Ketones, ur: NEGATIVE mg/dL
Leukocytes, UA: NEGATIVE
Nitrite: NEGATIVE
Protein, ur: 30 mg/dL — AB
Specific Gravity, Urine: 1.02 (ref 1.005–1.030)
Urobilinogen, UA: 1 mg/dL (ref 0.0–1.0)
pH: 7 (ref 5.0–8.0)

## 2017-11-03 MED ORDER — PROMETHAZINE HCL 25 MG PO TABS: 25.0000 mg | ORAL_TABLET | Freq: Four times a day (QID) | ORAL | 2 refills | Status: DC | PRN

## 2017-11-03 MED ORDER — PREPLUS 27-1 MG PO TABS: 1.0000 | ORAL_TABLET | Freq: Every day | ORAL | 13 refills | Status: DC

## 2017-11-03 NOTE — Patient Instructions (Signed)
First Trimester of Pregnancy The first trimester of pregnancy is from week 1 until the end of week 13 (months 1 through 3). During this time, your baby will begin to develop inside you. At 6-8 weeks, the eyes and face are formed, and the heartbeat can be seen on ultrasound. At the end of 12 weeks, all the baby's organs are formed. Prenatal care is all the medical care you receive before the birth of your baby. Make sure you get good prenatal care and follow all of your doctor's instructions. Follow these instructions at home: Medicines  Take over-the-counter and prescription medicines only as told by your doctor. Some medicines are safe and some medicines are not safe during pregnancy.  Take a prenatal vitamin that contains at least 600 micrograms (mcg) of folic acid.  If you have trouble pooping (constipation), take medicine that will make your stool soft (stool softener) if your doctor approves. Eating and drinking  Eat regular, healthy meals.  Your doctor will tell you the amount of weight gain that is right for you.  Avoid raw meat and uncooked cheese.  If you feel sick to your stomach (nauseous) or throw up (vomit): ? Eat 4 or 5 small meals a day instead of 3 large meals. ? Try eating a few soda crackers. ? Drink liquids between meals instead of during meals.  To prevent constipation: ? Eat foods that are high in fiber, like fresh fruits and vegetables, whole grains, and beans. ? Drink enough fluids to keep your pee (urine) clear or pale yellow. Activity  Exercise only as told by your doctor. Stop exercising if you have cramps or pain in your lower belly (abdomen) or low back.  Do not exercise if it is too hot, too humid, or if you are in a place of great height (high altitude).  Try to avoid standing for long periods of time. Move your legs often if you must stand in one place for a long time.  Avoid heavy lifting.  Wear low-heeled shoes. Sit and stand up straight.  You  can have sex unless your doctor tells you not to. Relieving pain and discomfort  Wear a good support bra if your breasts are sore.  Take warm water baths (sitz baths) to soothe pain or discomfort caused by hemorrhoids. Use hemorrhoid cream if your doctor says it is okay.  Rest with your legs raised if you have leg cramps or low back pain.  If you have puffy, bulging veins (varicose veins) in your legs: ? Wear support hose or compression stockings as told by your doctor. ? Raise (elevate) your feet for 15 minutes, 3-4 times a day. ? Limit salt in your food. Prenatal care  Schedule your prenatal visits by the twelfth week of pregnancy.  Write down your questions. Take them to your prenatal visits.  Keep all your prenatal visits as told by your doctor. This is important. Safety  Wear your seat belt at all times when driving.  Make a list of emergency phone numbers. The list should include numbers for family, friends, the hospital, and police and fire departments. General instructions  Ask your doctor for a referral to a local prenatal class. Begin classes no later than at the start of month 6 of your pregnancy.  Ask for help if you need counseling or if you need help with nutrition. Your doctor can give you advice or tell you where to go for help.  Do not use hot tubs, steam rooms, or   saunas.  Do not douche or use tampons or scented sanitary pads.  Do not cross your legs for long periods of time.  Avoid all herbs and alcohol. Avoid drugs that are not approved by your doctor.  Do not use any tobacco products, including cigarettes, chewing tobacco, and electronic cigarettes. If you need help quitting, ask your doctor. You may get counseling or other support to help you quit.  Avoid cat litter boxes and soil used by cats. These carry germs that can cause birth defects in the baby and can cause a loss of your baby (miscarriage) or stillbirth.  Visit your dentist. At home, brush  your teeth with a soft toothbrush. Be gentle when you floss. Contact a doctor if:  You are dizzy.  You have mild cramps or pressure in your lower belly.  You have a nagging pain in your belly area.  You continue to feel sick to your stomach, you throw up, or you have watery poop (diarrhea).  You have a bad smelling fluid coming from your vagina.  You have pain when you pee (urinate).  You have increased puffiness (swelling) in your face, hands, legs, or ankles. Get help right away if:  You have a fever.  You are leaking fluid from your vagina.  You have spotting or bleeding from your vagina.  You have very bad belly cramping or pain.  You gain or lose weight rapidly.  You throw up blood. It may look like coffee grounds.  You are around people who have German measles, fifth disease, or chickenpox.  You have a very bad headache.  You have shortness of breath.  You have any kind of trauma, such as from a fall or a car accident. Summary  The first trimester of pregnancy is from week 1 until the end of week 13 (months 1 through 3).  To take care of yourself and your unborn baby, you will need to eat healthy meals, take medicines only if your doctor tells you to do so, and do activities that are safe for you and your baby.  Keep all follow-up visits as told by your doctor. This is important as your doctor will have to ensure that your baby is healthy and growing well. This information is not intended to replace advice given to you by your health care provider. Make sure you discuss any questions you have with your health care provider. Document Released: 12/31/2007 Document Revised: 07/22/2016 Document Reviewed: 07/22/2016 Elsevier Interactive Patient Education  2017 Elsevier Inc[employer]  

## 2017-11-03 NOTE — Progress Notes (Signed)
Subjective:   Julia Armstrong is a 27 y.o. [id number] at [redacted]w[redacted]d by early ultrasound being seen today for her first obstetrical visit.  Her obstetrical history is significant for obesity and previous cesarean section. Patient does intend to breast feed and formula feed. Pregnancy history fully reviewed.  Patient reports nausea and vomiting. She reports N/V that occurs "all day everyday". She reports vomiting at least 4 times a day- does not have any medication refills left from MAU visit on 10/01/17.  HISTORY: OB History  Gravida Para Term Preterm AB Living  6 3 3  0 2 3  SAB TAB Ectopic Multiple Live Births  2 0 0 0 3    # Outcome Date GA Lbr Len/2nd Weight Sex Delivery Anes PTL Lv  6 Current           5 SAB 01/25/17          4 Term 02/21/15    M CS-LTranv   LIV  3 Term 06/27/13    M CS-LTranv   LIV  2 Term 07/09/12    M CS-LTranv   LIV  1 SAB             Last pap smear was done 3 years ago and was normal- per patient.   Past Medical History:  Diagnosis Date  . Gallstones    Past Surgical History:  Procedure Laterality Date  . CESAREAN SECTION    . DILATION AND EVACUATION N/A 02/18/2017   Procedure: DILATATION AND EVACUATION;  Surgeon: Tereso Newcomer, MD;  Location: [facility];  Service: Gynecology;  Laterality: N/A;   Family History  Problem Relation Age of Onset  . Cancer Other   . Cancer Maternal Grandmother        breast cancer   Social History   Tobacco Use  . Smoking status: Never Smoker  . Smokeless tobacco: Never Used  Substance Use Topics  . Alcohol use: No  . Drug use: No   No Known Allergies Current Outpatient Medications on File Prior to Visit  Medication Sig Dispense Refill  . cyclobenzaprine (FLEXERIL) 10 MG tablet Take 1 tablet (10 mg total) by mouth 3 (three) times daily as needed for muscle spasms. (Patient not taking: Reported on 09/16/2017) 30 tablet 3  . docusate sodium (COLACE) 100 MG capsule Take 1 capsule (100 mg total) by mouth 2 (two) times  daily as needed. (Patient not taking: Reported on 09/16/2017) 30 capsule 2  . erythromycin ophthalmic ointment Place 1 application into both eyes every 6 (six) hours. Place 1/2 inch ribbon of ointment in the affected eye 4 times a day (Patient not taking: Reported on 09/16/2017) 1 g 1  . metroNIDAZOLE (METROGEL VAGINAL) 0.75 % vaginal gel Place 1 Applicatorful vaginally 2 (two) times daily. (Patient not taking: Reported on 11/03/2017) 70 g 0   No current facility-administered medications on file prior to visit.     Review of Systems Pertinent items noted in HPI and remainder of comprehensive ROS otherwise negative.  Exam   Vitals:   11/03/17 1109  BP: 117/63  Pulse: 70  Weight: 204 lb 8 oz (92.8 kg)   Fetal Heart Rate (bpm): 150  Pelvic Exam: Perineum: no hemorrhoids, normal perineum   Vulva: normal external genitalia, no lesions   Vagina:  normal mucosa, normal discharge   Cervix: no lesions and normal, pap smear done.    Adnexa: normal adnexa and no mass, fullness, tenderness   Bony Pelvis: average  System: General: well-developed,  obese female in no acute distress   Breast:  normal appearance, no masses or tenderness   Skin: normal coloration and turgor, no rashes    Neurologic: oriented, normal, negative, normal mood   Extremities: normal strength, tone, and muscle mass, ROM of all joints is normal   HEENT PERRLA, extraocular movement intact and sclera clear.    Mouth/Teeth mucous membranes moist, pharynx normal without lesions and dental hygiene good   Neck supple and no masses   Cardiovascular: regular rate and rhythm   Respiratory:  no respiratory distress, normal breath sounds   Abdomen: soft, non-tender; bowel sounds normal; no masses,  no organomegaly     Assessment:   Pregnancy: [id number] Patient Active Problem List   Diagnosis Date Noted  . Supervision of other normal pregnancy, antepartum 11/03/2017  . Previous cesarean delivery, antepartum 11/03/2017  . History  of depression 11/03/2017     Plan:  1. Supervision of other normal pregnancy, antepartum -Patient doing okay- hx of depression, does not want to talk to [place] at this time.  -patient reports other children being in custody of Aunt due to drama with FOB's girlfriend. She reports having meeting with case worker after this appointment.   - Culture, OB Urine - Cystic fibrosis gene test - Cytology - PAP - Genetic Screening - Hemoglobinopathy Evaluation - Obstetric Panel, Including HIV - SMN1 COPY NUMBER ANALYSIS (SMA Carrier Screen) - US MFM OB COMP + 14 WK; Future - POCT urinalysis dip (device) - Prenatal Vit-Fe Fumarate-FA (PREPLUS) 27-1 MG TABS; Take 1 tablet by mouth daily.  Dispense: 30 tablet; Refill: 13  2. Previous cesarean delivery, antepartum C/S x3, plans repeat   3. Nausea and vomiting during pregnancy -She reports N/V that occurs "all day everyday". She reports vomiting at least 4 times a day- does not have any medication refills left from MAU visit on 10/01/17. - promethazine (PHENERGAN) 25 MG tablet; Take 1 tablet (25 mg total) by mouth every 6 (six) hours as needed for nausea or vomiting.  Dispense: 30 tablet; Refill: 2   Initial labs drawn. Continue prenatal vitamins. Genetic Screening discussed, First trimester screen, Quad screen and NIPS: ordered. Ultrasound discussed; fetal anatomic survey: ordered. Problem list reviewed and updated. The nature [facility] with multiple MDs and other Advanced Practice Providers was explained to patient; also emphasized that residents, students are part of our team. Routine obstetric precautions reviewed. Return in about 1 month (around 12/01/2017) for ROB.     Steward DroneVeronica Matia Zelada, CNM [facility]

## 2017-11-04 LAB — CYTOLOGY - PAP
Chlamydia: NEGATIVE
Diagnosis: NEGATIVE
Neisseria Gonorrhea: NEGATIVE

## 2017-11-05 LAB — URINE CULTURE, OB REFLEX

## 2017-11-05 LAB — CULTURE, OB URINE

## 2017-11-11 LAB — OBSTETRIC PANEL, INCLUDING HIV
Antibody Screen: NEGATIVE
Basophils Absolute: 0 10*3/uL (ref 0.0–0.2)
Basos: 0 %
EOS (ABSOLUTE): 0 10*3/uL (ref 0.0–0.4)
Eos: 0 %
HIV Screen 4th Generation wRfx: NONREACTIVE
Hematocrit: 38.5 % (ref 34.0–46.6)
Hemoglobin: 12.5 g/dL (ref 11.1–15.9)
Hepatitis B Surface Ag: NEGATIVE
Immature Grans (Abs): 0 10*3/uL (ref 0.0–0.1)
Immature Granulocytes: 0 %
Lymphocytes Absolute: 1.9 10*3/uL (ref 0.7–3.1)
Lymphs: 36 %
MCH: 25.2 pg — ABNORMAL LOW (ref 26.6–33.0)
MCHC: 32.5 g/dL (ref 31.5–35.7)
MCV: 78 fL — ABNORMAL LOW (ref 79–97)
Monocytes Absolute: 0.2 10*3/uL (ref 0.1–0.9)
Monocytes: 4 %
Neutrophils Absolute: 3.1 10*3/uL (ref 1.4–7.0)
Neutrophils: 60 %
Platelets: 277 10*3/uL (ref 150–379)
RBC: 4.96 x10E6/uL (ref 3.77–5.28)
RDW: 16 % — ABNORMAL HIGH (ref 12.3–15.4)
RPR Ser Ql: NONREACTIVE
Rh Factor: POSITIVE
Rubella Antibodies, IGG: 3.97 index (ref >0.99)
WBC: 5.3 10*3/uL (ref 3.4–10.8)

## 2017-11-11 LAB — HEMOGLOBINOPATHY EVALUATION
Ferritin: 21 ng/mL (ref 15–150)
Hgb A2 Quant: 2.4 % (ref 1.8–3.2)
Hgb A: 97 % (ref 96.4–98.8)
Hgb C: 0 %
Hgb F Quant: 0.6 % (ref 0.0–2.0)
Hgb S: 0 %
Hgb Solubility: NEGATIVE
Hgb Variant: 0 %

## 2017-11-11 LAB — SMN1 COPY NUMBER ANALYSIS (SMA CARRIER SCREENING)

## 2017-11-11 LAB — ALPHA-THALASSEMIA

## 2017-11-11 LAB — CYSTIC FIBROSIS GENE TEST

## 2017-11-23 ENCOUNTER — Encounter (HOSPITAL_COMMUNITY): Payer: Self-pay | Admitting: *Deleted

## 2017-11-23 ENCOUNTER — Inpatient Hospital Stay (HOSPITAL_COMMUNITY)
Admission: AD | Admit: 2017-11-23 | Discharge: 2017-11-24 | Disposition: A | Discharge status: Home / Self Care | Payer: Medicaid Other | Source: Ambulatory Visit | Attending: Obstetrics and Gynecology | Admitting: Obstetrics and Gynecology

## 2017-11-23 DIAGNOSIS — O219 Vomiting of pregnancy, unspecified: Secondary | ICD-10-CM

## 2017-11-23 DIAGNOSIS — O26899 Other specified pregnancy related conditions, unspecified trimester: Secondary | ICD-10-CM

## 2017-11-23 DIAGNOSIS — O210 Mild hyperemesis gravidarum: Secondary | ICD-10-CM | POA: Insufficient documentation

## 2017-11-23 DIAGNOSIS — O26892 Other specified pregnancy related conditions, second trimester: Secondary | ICD-10-CM | POA: Diagnosis not present

## 2017-11-23 DIAGNOSIS — R519 Headache, unspecified: Secondary | ICD-10-CM

## 2017-11-23 DIAGNOSIS — Z3A15 15 weeks gestation of pregnancy: Secondary | ICD-10-CM | POA: Insufficient documentation

## 2017-11-23 DIAGNOSIS — R51 Headache: Secondary | ICD-10-CM | POA: Diagnosis not present

## 2017-11-23 DIAGNOSIS — R109 Unspecified abdominal pain: Secondary | ICD-10-CM | POA: Diagnosis present

## 2017-11-23 LAB — URINALYSIS, ROUTINE W REFLEX MICROSCOPIC
BILIRUBIN URINE: NEGATIVE
Glucose, UA: NEGATIVE mg/dL
HGB URINE DIPSTICK: NEGATIVE
KETONES UR: 20 mg/dL — AB
NITRITE: NEGATIVE
PROTEIN: NEGATIVE mg/dL
Specific Gravity, Urine: 1.03 (ref 1.005–1.030)
pH: 5 (ref 5.0–8.0)

## 2017-11-23 NOTE — MAU Note (Signed)
PT SAYS SHE HAS H/A-   STARTED  LAST NIGHT -  TOOK TYLENOL AT  9PM  1 TAB    -  WENT  TO SLEEP.     RARELY  GETS  H/A . [facility]- CLINIC-     DID NOT  CALL.    SAYS HAS ABD PAIN- STARTED  TODAY-  WHEN GOT UP TO GO TO WORK-  LEFT WORK EARLY .

## 2017-11-24 ENCOUNTER — Encounter (HOSPITAL_COMMUNITY): Payer: Self-pay

## 2017-11-24 DIAGNOSIS — R51 Headache: Secondary | ICD-10-CM | POA: Diagnosis not present

## 2017-11-24 DIAGNOSIS — O26892 Other specified pregnancy related conditions, second trimester: Secondary | ICD-10-CM

## 2017-11-24 LAB — COMPREHENSIVE METABOLIC PANEL
ALBUMIN: 2.9 g/dL — AB (ref 3.5–5.0)
ALT: 13 U/L — ABNORMAL LOW (ref 14–54)
AST: 18 U/L (ref 15–41)
Alkaline Phosphatase: 45 U/L (ref 38–126)
Anion gap: 11 (ref 5–15)
BUN: 8 mg/dL (ref 6–20)
CHLORIDE: 102 mmol/L (ref 101–111)
CO2: 21 mmol/L — AB (ref 22–32)
Calcium: 8.8 mg/dL — ABNORMAL LOW (ref 8.9–10.3)
Creatinine, Ser: 0.52 mg/dL (ref 0.44–1.00)
GFR calc Af Amer: >60 mL/min (ref >60)
GFR calc non Af Amer: >60 mL/min (ref >60)
GLUCOSE: 87 mg/dL (ref 65–99)
POTASSIUM: 3.6 mmol/L (ref 3.5–5.1)
Sodium: 134 mmol/L — ABNORMAL LOW (ref 135–145)
Total Bilirubin: 0.3 mg/dL (ref 0.3–1.2)
Total Protein: 7 g/dL (ref 6.5–8.1)

## 2017-11-24 LAB — CBC
HEMATOCRIT: 35.1 % — AB (ref 36.0–46.0)
Hemoglobin: 11.9 g/dL — ABNORMAL LOW (ref 12.0–15.0)
MCH: 26 pg (ref 26.0–34.0)
MCHC: 33.9 g/dL (ref 30.0–36.0)
MCV: 76.8 fL — ABNORMAL LOW (ref 78.0–100.0)
PLATELETS: 222 10*3/uL (ref 150–400)
RBC: 4.57 MIL/uL (ref 3.87–5.11)
RDW: 14.6 % (ref 11.5–15.5)
WBC: 6.6 10*3/uL (ref 4.0–10.5)

## 2017-11-24 MED ORDER — DEXAMETHASONE SODIUM PHOSPHATE 10 MG/ML IJ SOLN
10.0000 mg | Freq: Once | INTRAMUSCULAR | Status: AC
  Administered 2017-11-24: 10 mg via INTRAVENOUS
  Filled 2017-11-24: qty 1

## 2017-11-24 MED ORDER — SODIUM CHLORIDE 0.9 % IV SOLN
Freq: Once | INTRAVENOUS | Status: AC
  Administered 2017-11-24: 03:00:00 via INTRAVENOUS

## 2017-11-24 MED ORDER — DIPHENHYDRAMINE HCL 50 MG/ML IJ SOLN
25.0000 mg | Freq: Once | INTRAMUSCULAR | Status: AC
  Administered 2017-11-24: 25 mg via INTRAVENOUS
  Filled 2017-11-24: qty 1

## 2017-11-24 MED ORDER — BUTALBITAL-APAP-CAFFEINE 50-325-40 MG PO TABS
2.0000 | ORAL_TABLET | Freq: Once | ORAL | Status: AC
  Administered 2017-11-24: 2 via ORAL
  Filled 2017-11-24: qty 2

## 2017-11-24 MED ORDER — BUTALBITAL-APAP-CAFFEINE 50-325-40 MG PO TABS: 1.0000 | ORAL_TABLET | Freq: Four times a day (QID) | ORAL | 0 refills | Status: DC | PRN

## 2017-11-24 MED ORDER — METOCLOPRAMIDE HCL 5 MG/ML IJ SOLN
10.0000 mg | Freq: Once | INTRAMUSCULAR | Status: AC
  Administered 2017-11-24: 10 mg via INTRAVENOUS
  Filled 2017-11-24: qty 2

## 2017-11-24 NOTE — MAU Note (Signed)
Has not been able to eat or drink since 1230 on Monday. 5 episodes of emesis yesterday.  Constant headache and stomachache. Anti-emetics not working; taking Zofran.

## 2017-11-24 NOTE — MAU Provider Note (Signed)
History  CSN: [id number] Arrival date and time: 11/23/17 2007  First Provider Initiated Contact with Patient 11/24/17 0205      Chief Complaint  Patient presents with  . Abdominal Pain  . Headache    HPI: Julia Armstrong is a 27 y.o. [phone] with IUP at [redacted]w[redacted]d who presents to maternity admissions reporting for headache, abdominal pain and nausea. She reports she has had nausea and vomiting, for which she has been on Zofran prn. Reports 5 episodes of emesis yesterday, better today, and reports she was able to tolerate some po earlier, but reports small given that eating makes her sick to her stomach. Reports generalized abdominal pain. Reports now has headache for about 24 hours. Took 325 mg of Tylenol once w/o improvement, and has not tried to take anything else for this. Denies blurry vision, visual disturbances, diarrhea, constipation, urinary symptoms, fever or chills. Also denies any vaginal bleeding, discharge or LOF.    OB History  Gravida Para Term Preterm AB Living  SAB TAB Ectopic Multiple Live Births  2       3    # Outcome Date GA Lbr Len/2nd Weight Sex Delivery Anes PTL Lv  6 Current           5 SAB 01/25/17          4 Term 02/21/15    M CS-LTranv   LIV  3 Term 06/27/13    M CS-LTranv   LIV  2 Term 07/09/12    M CS-LTranv   LIV  1 SAB            Past Medical History:  Diagnosis Date  . Gallstones    Past Surgical History:  Procedure Laterality Date  . CESAREAN SECTION    . DILATION AND EVACUATION N/A 02/18/2017   Procedure: DILATATION AND EVACUATION;  Surgeon: Tereso Newcomer, MD;  Location: [facility];  Service: Gynecology;  Laterality: N/A;   Family History  Problem Relation Age of Onset  . Cancer Other   . Cancer Maternal Grandmother        breast cancer   Social History   Socioeconomic History  . Marital status: Single    Spouse name: Not on file  . Number of children: Not on file  . Years of education: Not on file  . Highest education  level: Not on file  Occupational History  . Not on file  Social Needs  . Financial resource strain: Not on file  . Food insecurity:    Worry: Not on file    Inability: Not on file  . Transportation needs:    Medical: Not on file    Non-medical: Not on file  Tobacco Use  . Smoking status: Never Smoker  . Smokeless tobacco: Never Used  Substance and Sexual Activity  . Alcohol use: No  . Drug use: No  . Sexual activity: Yes    Birth control/protection: None  Lifestyle  . Physical activity:    Days per week: Not on file    Minutes per session: Not on file  . Stress: Not on file  Relationships  . Social connections:    Talks on phone: Not on file    Gets together: Not on file    Attends religious service: Not on file    Active member of club or organization: Not on file    Attends meetings of clubs or organizations: Not on file    Relationship  status: Not on file  . Intimate partner violence:    Fear of current or ex partner: Not on file    Emotionally abused: Not on file    Physically abused: Not on file    Forced sexual activity: Not on file  Other Topics Concern  . Not on file  Social History Narrative  . Not on file   No Known Allergies  Medications Prior to Admission  Medication Sig Dispense Refill Last Dose  . acetaminophen (TYLENOL) 325 MG tablet Take 650 mg by mouth every 6 (six) hours as needed for headache.   11/23/2017 at Unknown time  . Prenatal Vit-Fe Fumarate-FA (PREPLUS) 27-1 MG TABS Take 1 tablet by mouth daily. 30 tablet 13 11/23/2017 at Unknown time  . promethazine (PHENERGAN) 25 MG tablet Take 1 tablet (25 mg total) by mouth every 6 (six) hours as needed for nausea or vomiting. 30 tablet 2 11/22/2017 at Unknown time  . cyclobenzaprine (FLEXERIL) 10 MG tablet Take 1 tablet (10 mg total) by mouth 3 (three) times daily as needed for muscle spasms. (Patient not taking: Reported on 09/16/2017) 30 tablet 3 Not Taking  . docusate sodium (COLACE) 100 MG capsule  Take 1 capsule (100 mg total) by mouth 2 (two) times daily as needed. (Patient not taking: Reported on 09/16/2017) 30 capsule 2 Not Taking  . erythromycin ophthalmic ointment Place 1 application into both eyes every 6 (six) hours. Place 1/2 inch ribbon of ointment in the affected eye 4 times a day (Patient not taking: Reported on 09/16/2017) 1 g 1 Not Taking  . metroNIDAZOLE (METROGEL VAGINAL) 0.75 % vaginal gel Place 1 Applicatorful vaginally 2 (two) times daily. (Patient not taking: Reported on 11/03/2017) 70 g 0 Not Taking    I have reviewed patient's Past Medical Hx, Surgical Hx, Family Hx, Social Hx, medications and allergies.   Review of Systems: Negative except for what is mentioned in HPI.  Physical Exam   Blood pressure 118/64, pulse 83, temperature 98.7 F (37.1 C), temperature source Oral, resp. rate 18, height  (1.702 m), weight 207 lb 4 oz (94 kg), last menstrual period 08/05/2017.  Constitutional: Well-developed, well-nourished female in no acute distress.  HENT: [place]/AT, normal oropharynx mucosa. MMM Eyes: normal conjunctivae, no scleral icterus Cardiovascular: normal rate, regular rhythm Respiratory: normal effort, lungs CTAB.  GI: Abd soft, mild diffuse tenderness, gravid appropriate for gestational age.   GU: Neg CVAT. Pelvic: deferred MSK: Extremities nontender, no edema Neurologic: Alert and oriented x 4. Psych: Normal mood and affect Skin: warm and dry   FHT:  138 by bedside U/S  MAU Course/MDM:   2:30AM: Nursing notes and VS reviewed. BP wnl Patient seen and examined, as noted above.  UA  + ketones, SG 1.030 --IVFs, HA cocktail ordered --CBC, CMP ordered   4:50AM: Labs reviewed, all wnl Pt still has headache. Nausea improved. --Fioricet for HA --PO trial  5:57 AM: Pt tolerated po trial. HA improved.   Assessment and Plan  Assessment: 1. Pregnancy headache, antepartum   2. Nausea/vomiting in pregnancy     Plan: --Discharge home in stable  condition.  --Rx for Fiorecet prn at home --Discussed po hydration at home, small frequent meals and po fluids --Return precautions --Keep prenatal appointment next week    Stanisha Lorenz, Kandra Nicolas, MD 11/24/2017 5:58 AM  Future Appointments  Date Time Provider Department Center  12/01/2017  1:55 PM Burleson, Brand Males, NP [facility]

## 2017-11-24 NOTE — Discharge Instructions (Signed)
Nausea and Vomiting, Adult Feeling sick to your stomach (nausea) means that your stomach is upset or you feel like you have to throw up (vomit). Feeling more and more sick to your stomach can lead to throwing up. Throwing up happens when food and liquid from your stomach are thrown up and out the mouth. Throwing up can make you feel weak and cause you to get dehydrated. Dehydration can make you tired and thirsty, make you have a dry mouth, and make it so you pee (urinate) less often. Older adults and people with other diseases or a weak defense system (immune system) are at higher risk for dehydration. If you feel sick to your stomach or if you throw up, it is important to follow instructions from your doctor about how to take care of yourself. Follow these instructions at home: Eating and drinking Follow these instructions as told by your doctor:  Take an oral rehydration solution (ORS). This is a drink that is sold at pharmacies and stores.  Drink clear fluids in small amounts as you are able, such as: ? Water. ? Ice chips. ? Diluted fruit juice. ? Low-calorie sports drinks.  Eat bland, easy-to-digest foods in small amounts as you are able, such as: ? Bananas. ? Applesauce. ? Rice. ? Low-fat (lean) meats. ? Toast. ? Crackers.  Avoid fluids that have a lot of sugar or caffeine in them.  Avoid alcohol.  Avoid spicy or fatty foods.  General instructions  Drink enough fluid to keep your pee (urine) clear or pale yellow.  Wash your hands often. If you cannot use soap and water, use hand sanitizer.  Make sure that all people in your home wash their hands well and often.  Take over-the-counter and prescription medicines only as told by your doctor.  Rest at home while you get better.  Watch your condition for any changes.  Breathe slowly and deeply when you feel sick to your stomach.  Keep all follow-up visits as told by your doctor. This is important. Contact a doctor  if:  You have a fever.  You cannot keep fluids down.  Your symptoms get worse.  You have new symptoms.  You feel sick to your stomach for more than two days.  You feel light-headed or dizzy.  You have a headache.  You have muscle cramps. Get help right away if:  You have pain in your chest, neck, arm, or jaw.  You feel very weak or you pass out (faint).  You throw up again and again.  You see blood in your throw-up.  Your throw-up looks like black coffee grounds.  You have bloody or black poop (stools) or poop that look like tar.  You have a very bad headache, a stiff neck, or both.  You have a rash.  You have very bad pain, cramping, or bloating in your belly (abdomen).  You have trouble breathing.  You are breathing very quickly.  Your heart is beating very quickly.  Your skin feels cold and clammy.  You feel confused.  You have pain when you pee.  You have signs of dehydration, such as: ? Dark pee, hardly any pee, or no pee. ? Cracked lips. ? Dry mouth. ? Sunken eyes. ? Sleepiness. ? Weakness. These symptoms may be an emergency. Do not wait to see if the symptoms will go away. Get medical help right away. Call your local emergency services (911 in the [place] Do not drive yourself to the hospital. This information is   not intended to replace advice given to you by your health care provider. Make sure you discuss any questions you have with your health care provider. Document Released: 12/31/2007 Document Revised: 02/01/2016 Document Reviewed: 03/20/2015 Elsevier Interactive Patient Education  2018 Elsevier Inc.  

## 2017-12-01 ENCOUNTER — Ambulatory Visit (INDEPENDENT_AMBULATORY_CARE_PROVIDER_SITE_OTHER): Payer: Medicaid Other | Admitting: Nurse Practitioner

## 2017-12-01 VITALS — BP 127/57 | HR 89 | Wt 207.3 lb

## 2017-12-01 DIAGNOSIS — Z8659 Personal history of other mental and behavioral disorders: Secondary | ICD-10-CM

## 2017-12-01 DIAGNOSIS — O34219 Maternal care for unspecified type scar from previous cesarean delivery: Secondary | ICD-10-CM

## 2017-12-01 DIAGNOSIS — Z3480 Encounter for supervision of other normal pregnancy, unspecified trimester: Secondary | ICD-10-CM

## 2017-12-01 DIAGNOSIS — Z6838 Body mass index (BMI) 38.0-38.9, adult: Secondary | ICD-10-CM | POA: Insufficient documentation

## 2017-12-01 DIAGNOSIS — E669 Obesity, unspecified: Secondary | ICD-10-CM | POA: Insufficient documentation

## 2017-12-01 NOTE — Progress Notes (Signed)
    Subjective:  Julia Armstrong is a 27 y.o. [phone] at [redacted]w[redacted]d being seen today for ongoing prenatal care.  She is currently monitored for the following issues for this low-risk pregnancy and has Supervision of other normal pregnancy, antepartum; Previous cesarean delivery, antepartum; History of depression; and Obesity (BMI 35.0-39.9 without comorbidity) on their problem list.  Patient reports has periodic stabbing pains in her upper chest that resolve without intervention.  Also she has leg cramps and soreness in her left calf..  Contractions: Not present. Vag. Bleeding: None.  Movement: Present. Denies leaking of fluid. She is having vomiting 1-3 times a day and has several medications that she uses to manage (none are on her med list so asked her to bring them in so they can be added to her medication list).  Reports having Diclegis and Phenergan that she uses.  Also uses Zofran but did not know it causes constipation.  The following portions of the patient's history were reviewed and updated as appropriate: allergies, current medications, past family history, past medical history, past social history, past surgical history and problem list. Problem list updated.  Objective:   Vitals:   12/01/17 1413  BP: (!) 127/57  Pulse: 89  Weight: 207 lb 4.8 oz (94 kg)    Fetal Status: Fetal Heart Rate (bpm): 142 Fundal Height: 16 cm Movement: Present     General:  Alert, oriented and cooperative. Patient is in no acute distress.  Skin: Skin is warm and dry. No rash noted.   Cardiovascular: Normal heart rate noted  Respiratory: Normal respiratory effort, no problems with respiration noted  Abdomen: Soft, gravid, appropriate for gestational age. Pain/Pressure: Present     Pelvic:  Cervical exam deferred        Extremities: Normal range of motion.  Edema: Trace  Mental Status: Normal mood and affect. Normal behavior. Normal judgment and thought content.   Urinalysis:      Assessment and Plan:    Pregnancy: [id number] at [redacted]w[redacted]d  1. Previous cesarean delivery, antepartum Advised C/S is recommended for her delivery  2. History of depression Sees a counselor at [facility] currently and states it is helpful.  Does not need medication at this time.  3. Supervision of other normal pregnancy, antepartum  4.  Constipation - advised to take an OTC stool softener  Preterm labor symptoms and general obstetric precautions including but not limited to vaginal bleeding, contractions, leaking of fluid and fetal movement were reviewed in detail with the patient. Please refer to After Visit Summary for other counseling recommendations.  Return in about 1 month (around 12/29/2017).  Nolene Bernheim, RN, MSN, NP-BC Nurse Practitioner, [facility] for [employer], [facility] 12/01/2017 3:08 PM

## 2017-12-01 NOTE — Patient Instructions (Signed)
Second Trimester of Pregnancy The second trimester is from week 13 through week 28, month 4 through 6. This is often the time in pregnancy that you feel your best. Often times, morning sickness has lessened or quit. You may have more energy, and you may get hungry more often. Your unborn baby (fetus) is growing rapidly. At the end of the sixth month, he or she is about 9 inches long and weighs about 1 pounds. You will likely feel the baby move (quickening) between 18 and 20 weeks of pregnancy. Follow these instructions at home:  Avoid all smoking, herbs, and alcohol. Avoid drugs not approved by your doctor.  Do not use any tobacco products, including cigarettes, chewing tobacco, and electronic cigarettes. If you need help quitting, ask your doctor. You may get counseling or other support to help you quit.  Only take medicine as told by your doctor. Some medicines are safe and some are not during pregnancy.  Exercise only as told by your doctor. Stop exercising if you start having cramps.  Eat regular, healthy meals.  Wear a good support bra if your breasts are tender.  Do not use hot tubs, steam rooms, or saunas.  Wear your seat belt when driving.  Avoid raw meat, uncooked cheese, and liter boxes and soil used by cats.  Take your prenatal vitamins.  Take 1500-2000 milligrams of calcium daily starting at the 20th week of pregnancy until you deliver your baby.  Try taking medicine that helps you poop (stool softener) as needed, and if your doctor approves. Eat more fiber by eating fresh fruit, vegetables, and whole grains. Drink enough fluids to keep your pee (urine) clear or pale yellow.  Take warm water baths (sitz baths) to soothe pain or discomfort caused by hemorrhoids. Use hemorrhoid cream if your doctor approves.  If you have puffy, bulging veins (varicose veins), wear support hose. Raise (elevate) your feet for 15 minutes, 3-4 times a day. Limit salt in your diet.  Avoid heavy  lifting, wear low heals, and sit up straight.  Rest with your legs raised if you have leg cramps or low back pain.  Visit your dentist if you have not gone during your pregnancy. Use a soft toothbrush to brush your teeth. Be gentle when you floss.  You can have sex (intercourse) unless your doctor tells you not to.  Go to your doctor visits. Get help if:  You feel dizzy.  You have mild cramps or pressure in your lower belly (abdomen).  You have a nagging pain in your belly area.  You continue to feel sick to your stomach (nauseous), throw up (vomit), or have watery poop (diarrhea).  You have bad smelling fluid coming from your vagina.  You have pain with peeing (urination). Get help right away if:  You have a fever.  You are leaking fluid from your vagina.  You have spotting or bleeding from your vagina.  You have severe belly cramping or pain.  You lose or gain weight rapidly.  You have trouble catching your breath and have chest pain.  You notice sudden or extreme puffiness (swelling) of your face, hands, ankles, feet, or legs.  You have not felt the baby move in over an hour.  You have severe headaches that do not go away with medicine.  You have vision changes. This information is not intended to replace advice given to you by your health care provider. Make sure you discuss any questions you have with your health care   provider. Document Released: 10/08/2009 Document Revised: 12/20/2015 Document Reviewed: 09/14/2012 Elsevier Interactive Patient Education  2017 Elsevier Inc.  

## 2017-12-02 LAB — HEMOGLOBIN A1C
ESTIMATED AVERAGE GLUCOSE: 105 mg/dL
HEMOGLOBIN A1C: 5.3 % (ref 4.8–5.6)

## 2017-12-02 IMAGING — US US ABDOMEN LIMITED
1 series · 14 of 25 positions shown · non-contrast
Comparison: None.

CLINICAL DATA: Right upper quadrant abdominal pain

EXAM:
US ABDOMEN LIMITED - RIGHT UPPER QUADRANT

[Series 1: us abdomen limited · 0.22mm/px · 14 of 46 slices shown]
[im 1/46]
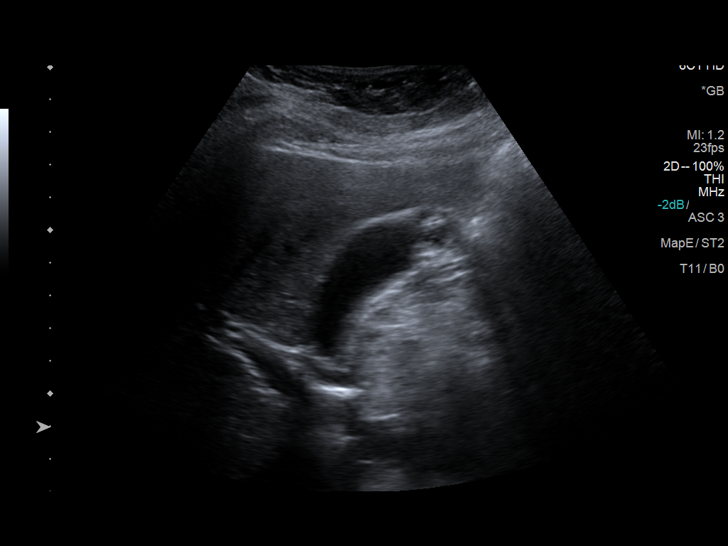
[im 4/46]
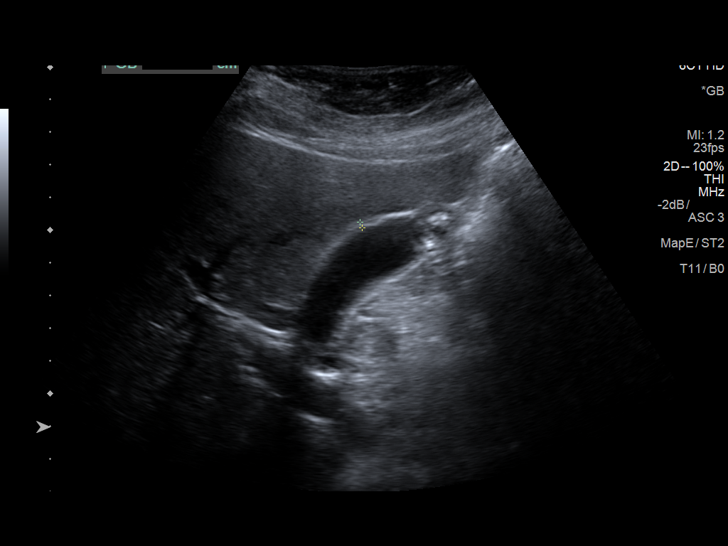
[im 8/46]
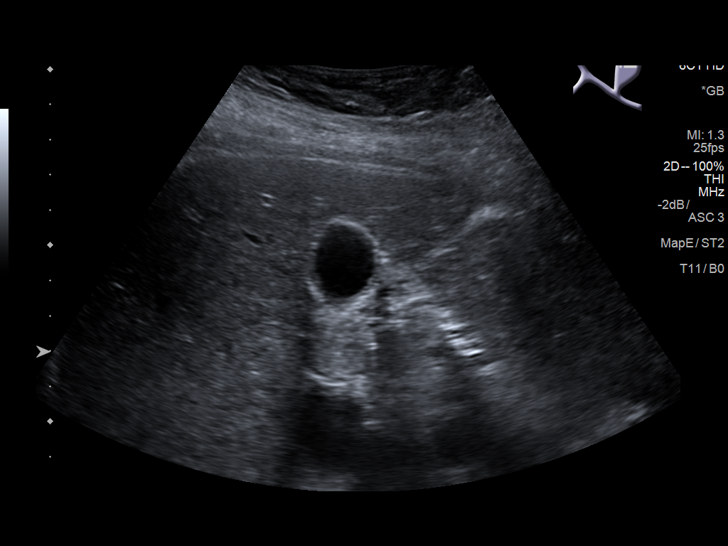
[im 12/46]
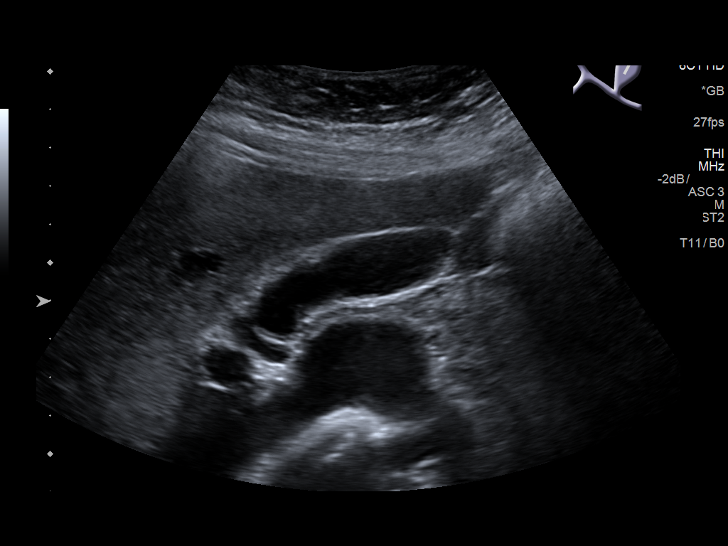
[im 16/46]
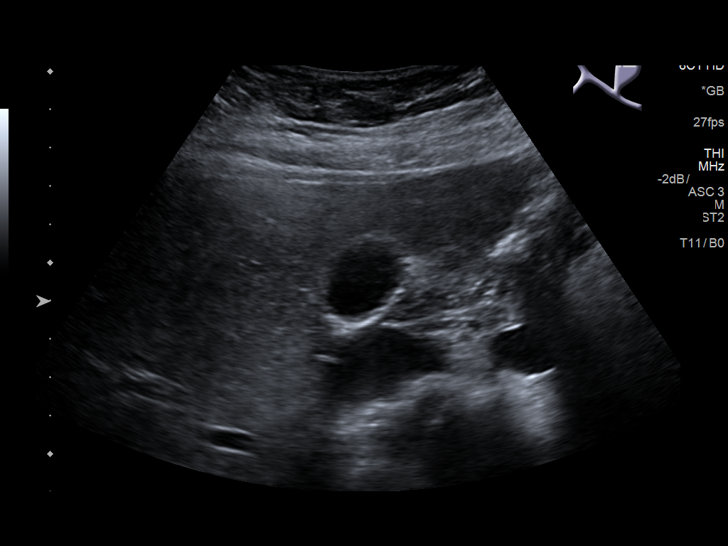
[im 17/46]
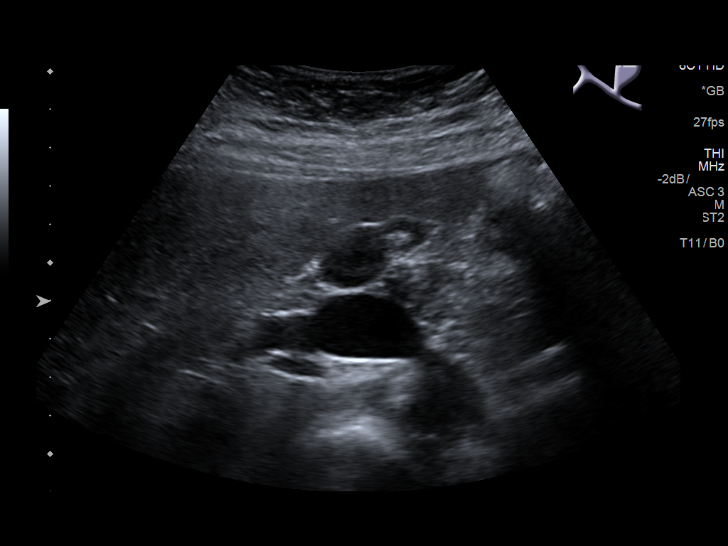
[im 21/46]
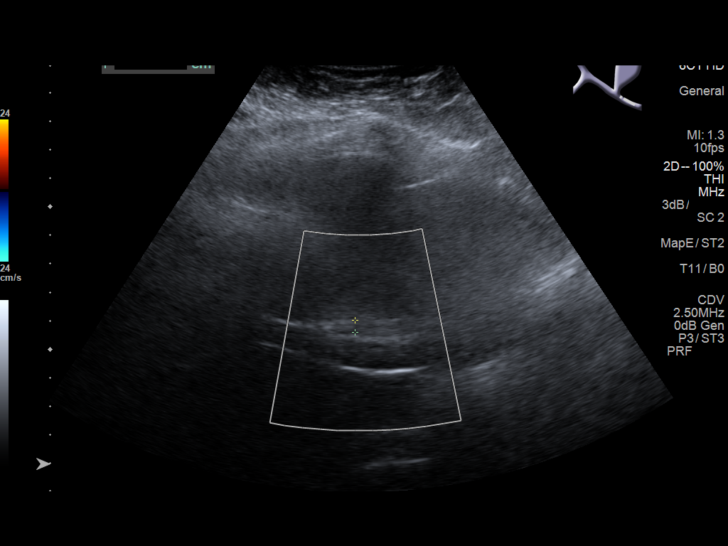
[im 25/46]
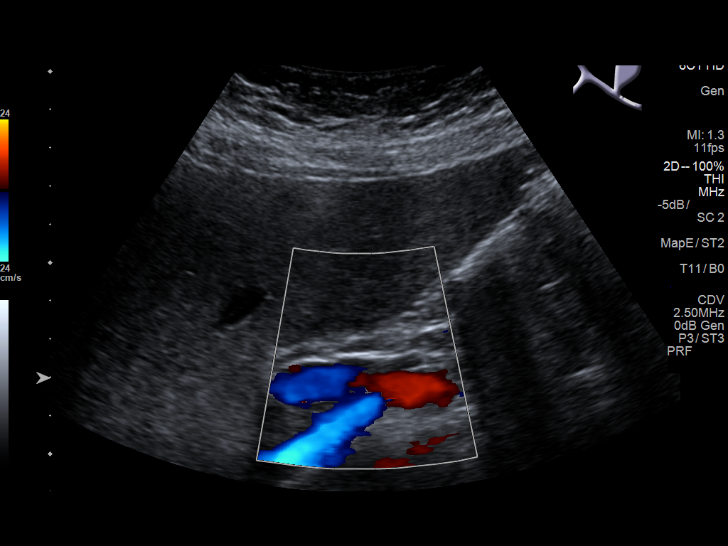
[im 29/46]
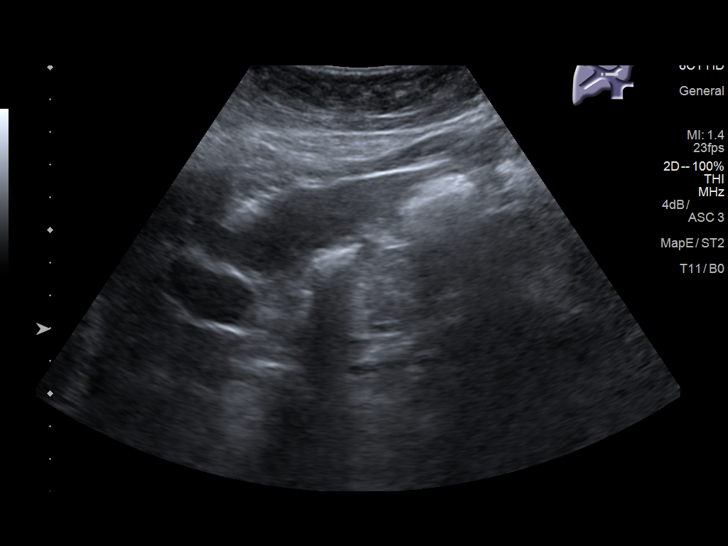
[im 31/46]
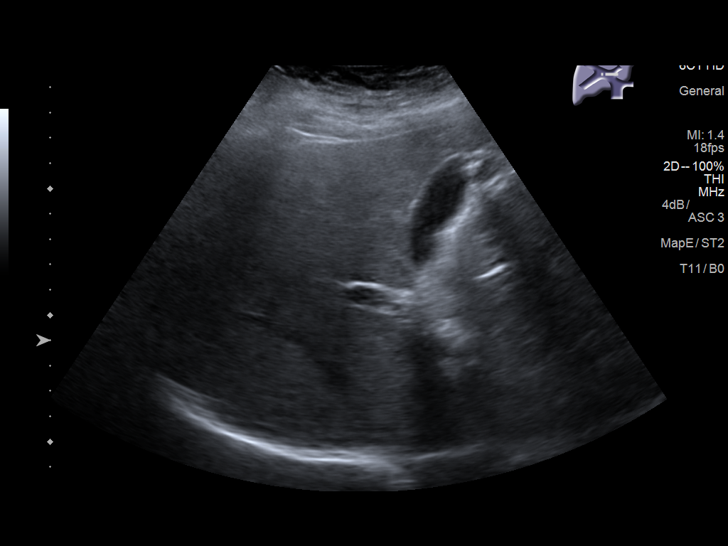
[im 34/46]
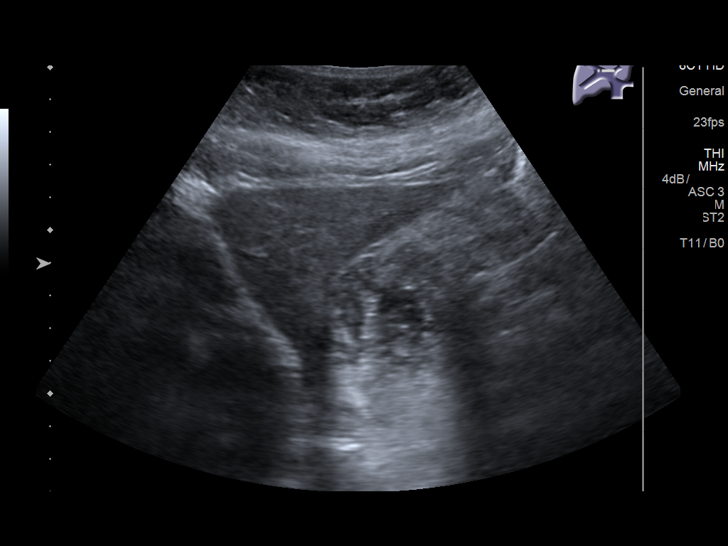
[im 38/46]
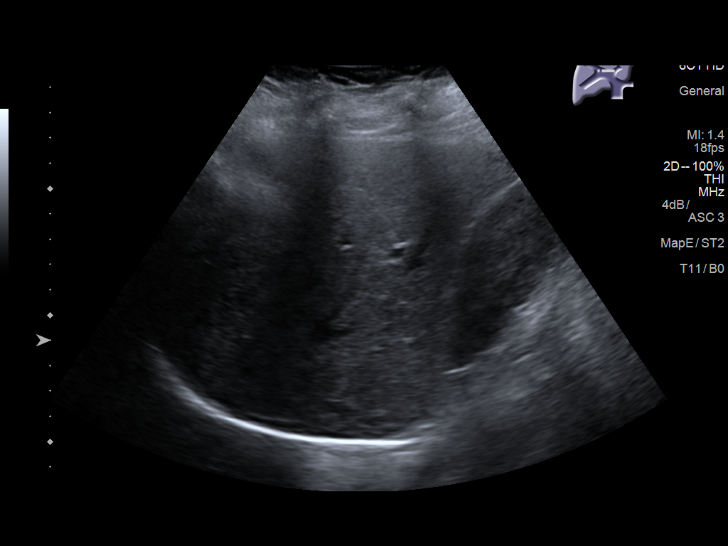
[im 42/46]
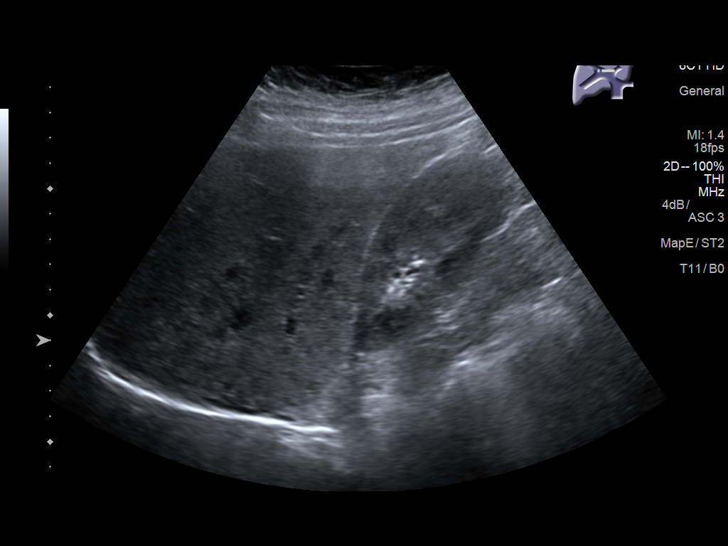
[im 46/46]
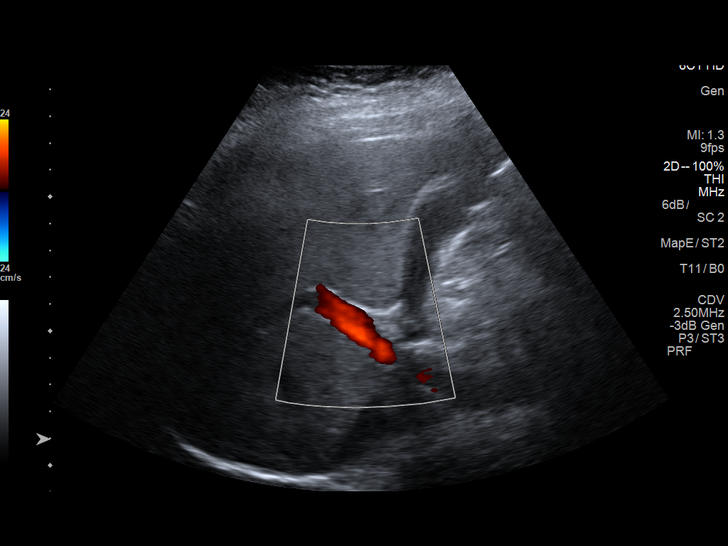

[14 of 25 positions shown; findings below may reference images not displayed]

FINDINGS: Gallbladder:

Calcified stone in the gallbladder measuring up to 6 mm. Negative
sonographic Murphy. Normal wall thickness.

Common bile duct:

Diameter: 4.2 mm

Liver:

No focal lesion identified. Within normal limits in parenchymal
echogenicity.
IMPRESSION: 1. Cholelithiasis without sonographic evidence for acute
cholecystitis or biliary dilatation

## 2017-12-02 IMAGING — US US OB COMP LESS 14 WK
1 series · 14 of 28 positions shown · non-contrast
Comparison: None.

CLINICAL DATA: Pelvic pain pelvi

EXAM:
OBSTETRIC <14 WK US AND TRANSVAGINAL OB US
TECHNIQUE: Both transabdominal and transvaginal ultrasound examinations were
performed for complete evaluation of the gestation as well as the
maternal uterus, adnexal regions, and pelvic cul-de-sac.
Transvaginal technique was performed to assess early pregnancy.

[Series 1: us ob comp less 14 wk · 0.23mm/px · 14 of 61 slices shown]
[im 3/61]
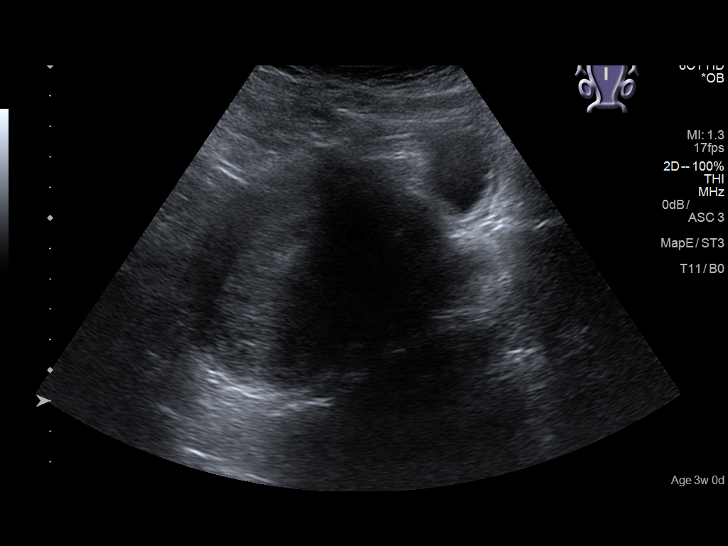
[im 7/61]
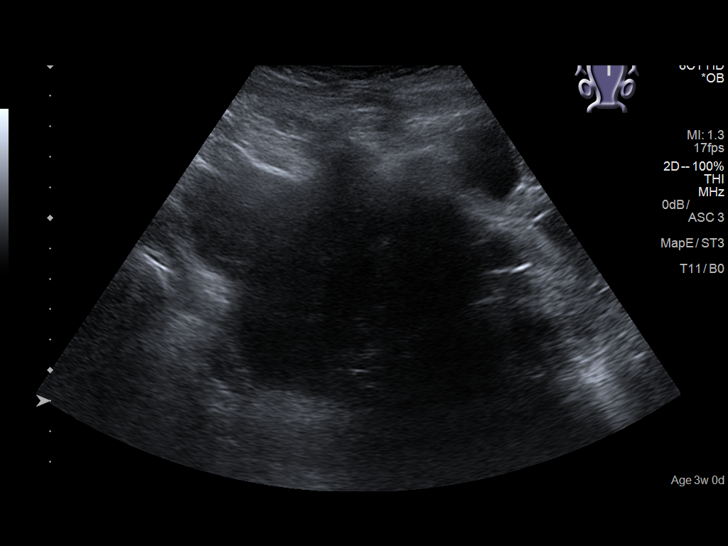
[im 12/61]
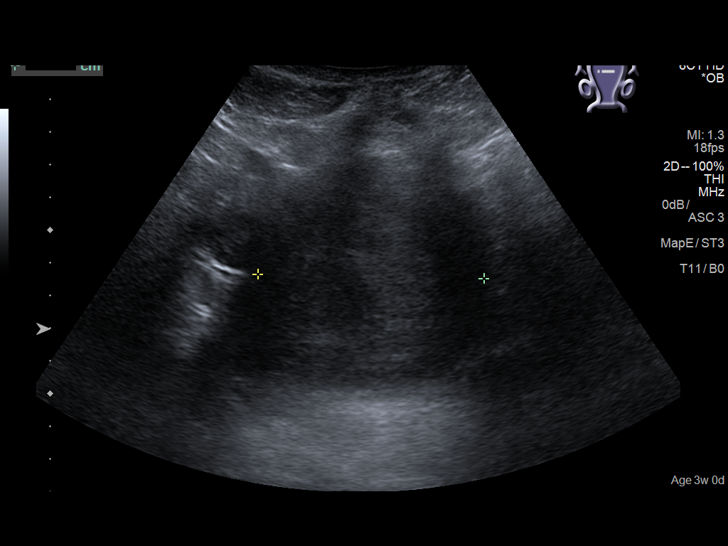
[im 16/61]
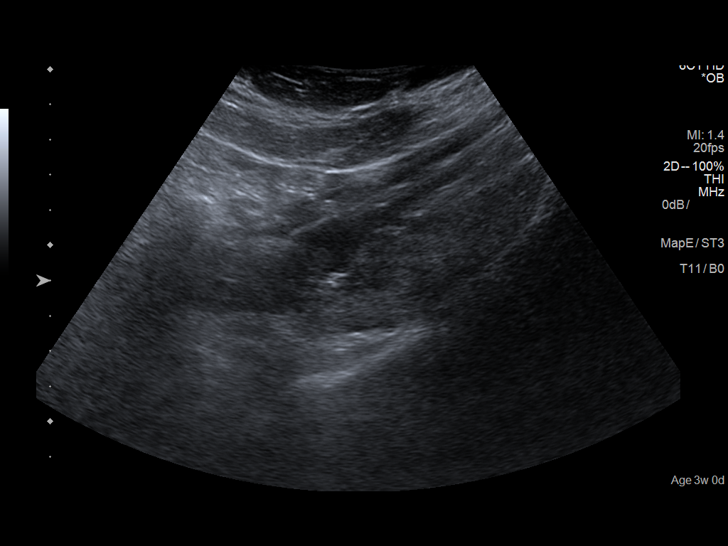
[im 21/61]
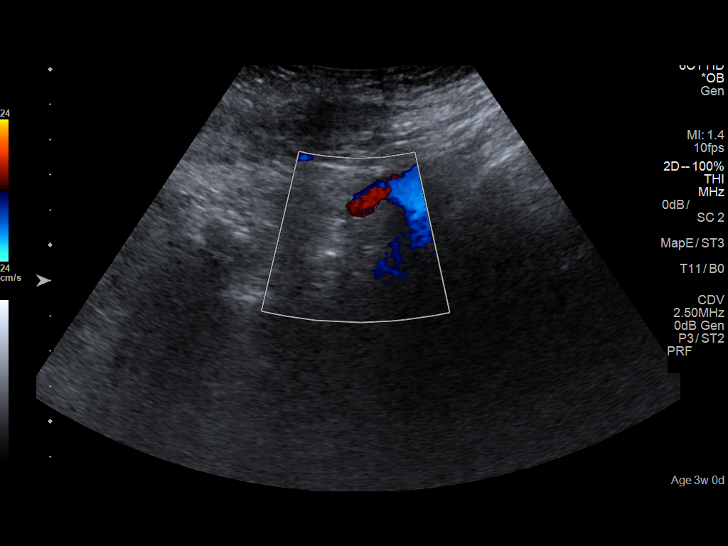
[im 25/61]
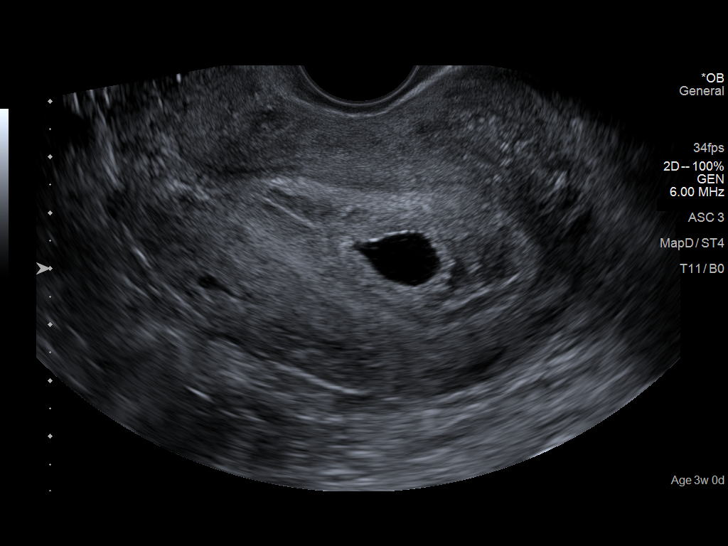
[im 29/61]
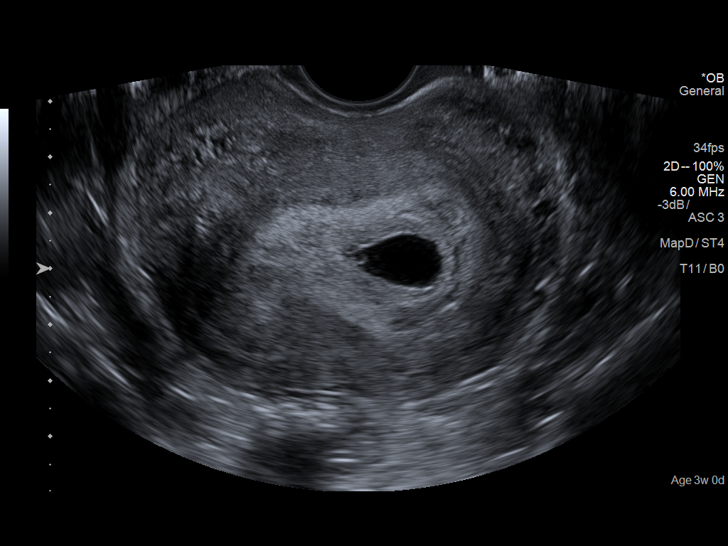
[im 34/61]
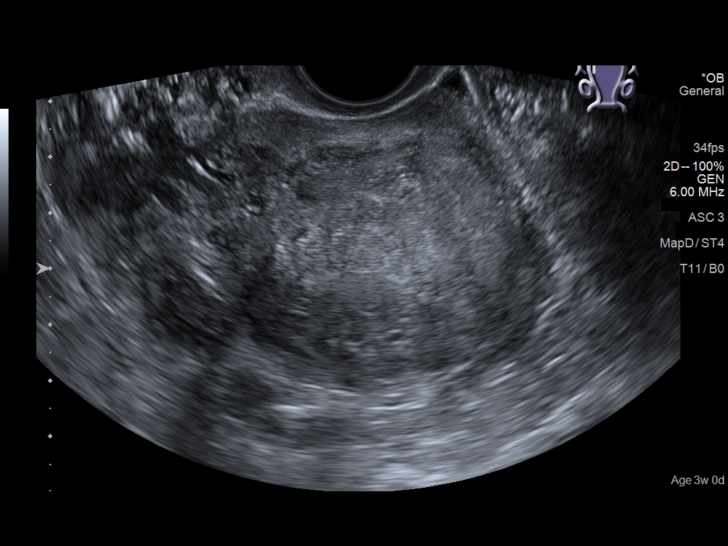
[im 38/61]
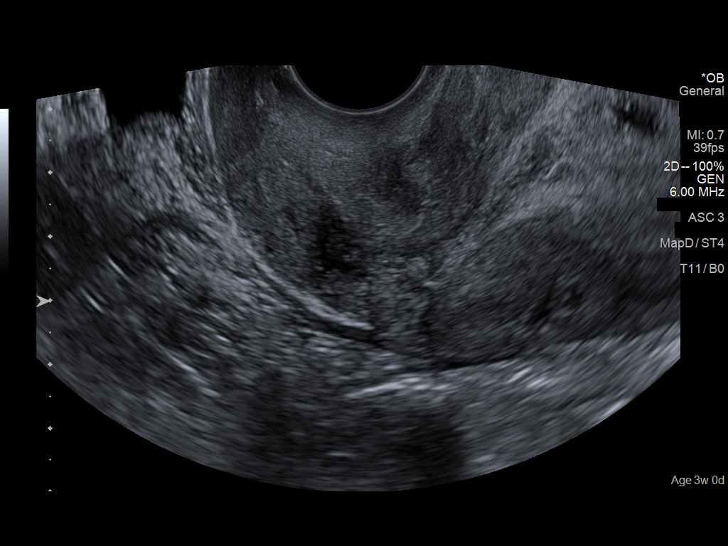
[im 43/61]
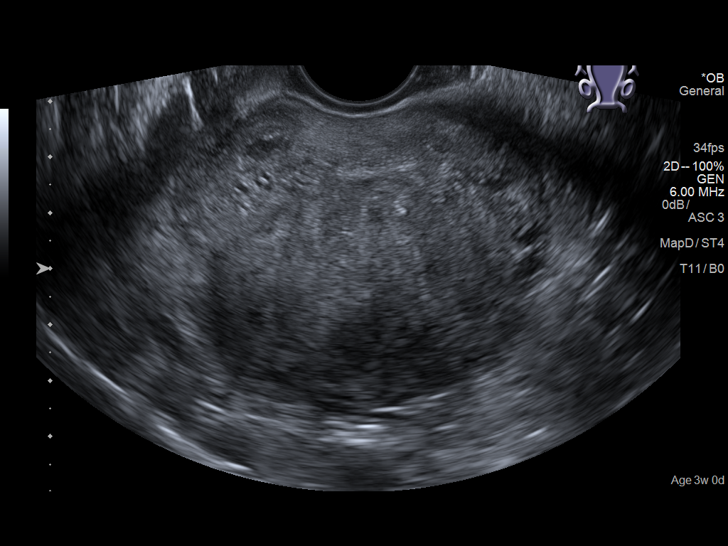
[im 47/61]
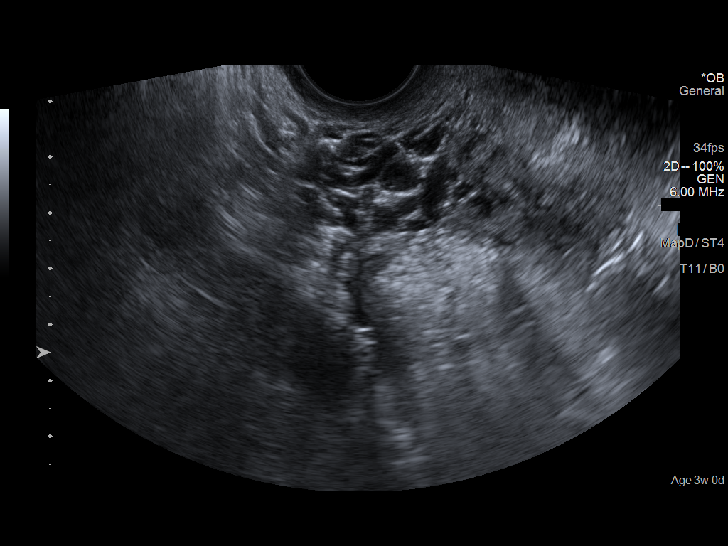
[im 52/61]
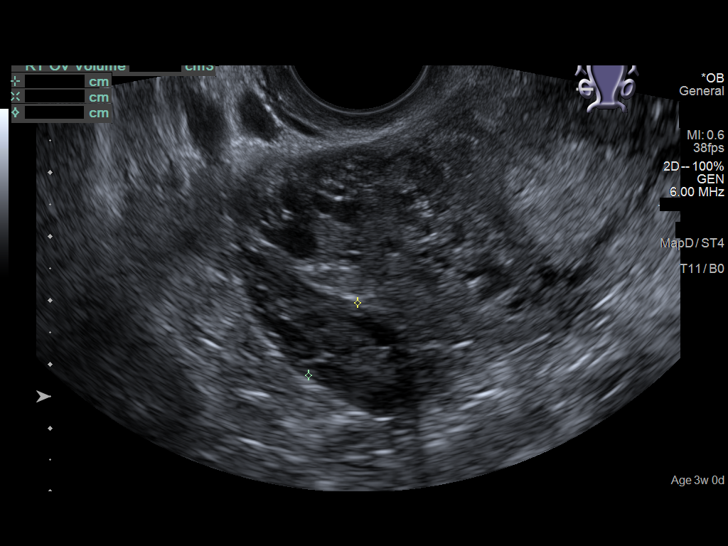
[im 56/61]
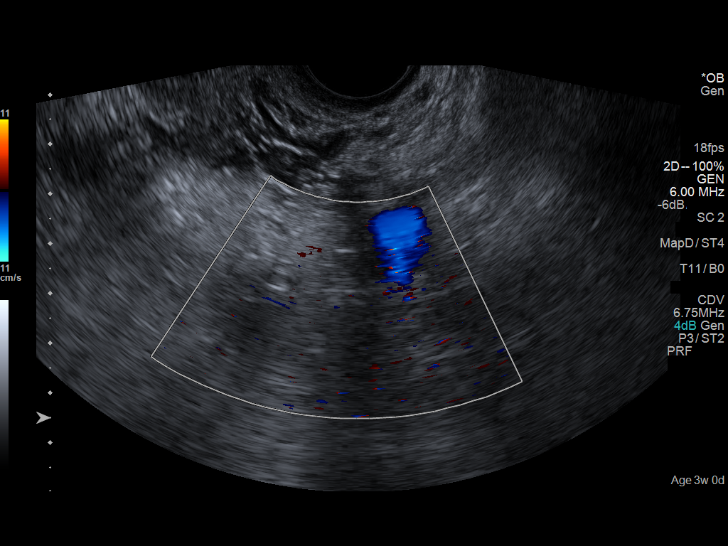
[im 61/61]
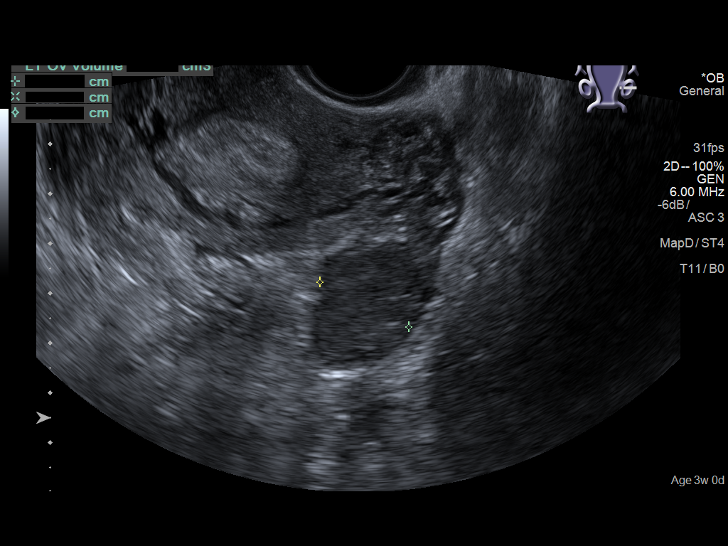

[14 of 28 positions shown; findings below may reference images not displayed]

FINDINGS: Intrauterine gestational sac: Single intrauterine gestational sac

Yolk sac:  Visualized

Embryo:  Not visualized

Cardiac Activity: Not visualized

MSD: 16  mm   6 w   3  d

Subchorionic hemorrhage:  None visualized.

Maternal uterus/adnexae: Bilateral ovaries are within normal limits.
Left ovary measures 3.2 x 2.3 x 2 cm. Right ovary measures 2.2 x
x 1.4 cm.
IMPRESSION: 1. Intrauterine pregnancy with visualization of a gestational sac
and yolk sac; fetal pole not yet identified. Sonographic follow-up
as clinically indicated.
2. No other abnormalities are visualized.

## 2017-12-09 ENCOUNTER — Encounter (HOSPITAL_COMMUNITY): Payer: Self-pay

## 2017-12-09 ENCOUNTER — Inpatient Hospital Stay (HOSPITAL_COMMUNITY)
Admission: AD | Admit: 2017-12-09 | Discharge: 2017-12-09 | Disposition: A | Discharge status: Home / Self Care | Payer: Medicaid Other | Source: Ambulatory Visit | Attending: Family Medicine | Admitting: Family Medicine

## 2017-12-09 DIAGNOSIS — R51 Headache: Secondary | ICD-10-CM | POA: Diagnosis not present

## 2017-12-09 DIAGNOSIS — R109 Unspecified abdominal pain: Secondary | ICD-10-CM | POA: Diagnosis present

## 2017-12-09 DIAGNOSIS — O26892 Other specified pregnancy related conditions, second trimester: Secondary | ICD-10-CM | POA: Diagnosis not present

## 2017-12-09 DIAGNOSIS — Z3A17 17 weeks gestation of pregnancy: Secondary | ICD-10-CM | POA: Insufficient documentation

## 2017-12-09 DIAGNOSIS — O219 Vomiting of pregnancy, unspecified: Secondary | ICD-10-CM | POA: Insufficient documentation

## 2017-12-09 DIAGNOSIS — R519 Headache, unspecified: Secondary | ICD-10-CM

## 2017-12-09 DIAGNOSIS — O26899 Other specified pregnancy related conditions, unspecified trimester: Secondary | ICD-10-CM

## 2017-12-09 LAB — URINALYSIS, ROUTINE W REFLEX MICROSCOPIC
Bilirubin Urine: NEGATIVE
GLUCOSE, UA: NEGATIVE mg/dL
Hgb urine dipstick: NEGATIVE
Ketones, ur: NEGATIVE mg/dL
LEUKOCYTES UA: NEGATIVE
NITRITE: NEGATIVE
PROTEIN: NEGATIVE mg/dL
Specific Gravity, Urine: 1.02 (ref 1.005–1.030)
pH: 6 (ref 5.0–8.0)

## 2017-12-09 MED ORDER — ACETAMINOPHEN 500 MG PO TABS
1000.0000 mg | ORAL_TABLET | Freq: Once | ORAL | Status: AC
  Administered 2017-12-09: 1000 mg via ORAL
  Filled 2017-12-09: qty 2

## 2017-12-09 MED ORDER — ONDANSETRON 8 MG PO TBDP
8.0000 mg | ORAL_TABLET | Freq: Once | ORAL | Status: DC
  Filled 2017-12-09: qty 1

## 2017-12-09 MED ORDER — METOCLOPRAMIDE HCL 10 MG PO TABS: 10.0000 mg | ORAL_TABLET | Freq: Three times a day (TID) | ORAL | 0 refills | Status: DC | PRN

## 2017-12-09 NOTE — MAU Note (Signed)
SAYS CRAMPING  STARTED  AT 2300.    CRAMPS SAME - LOWER ABD   AND  RIGHT SIDE.    H/A  STARTED AT 2300-  NO MEDS

## 2017-12-09 NOTE — Discharge Instructions (Signed)
Abdominal Pain During Pregnancy Belly (abdominal) pain is common during pregnancy. Most of the time, it is not a serious problem. Other times, it can be a sign that something is wrong with the pregnancy. Always tell your doctor if you have belly pain. Follow these instructions at home: Monitor your belly pain for any changes. The following actions may help you feel better:  Do not have sex (intercourse) or put anything in your vagina until you feel better.  Rest until your pain stops.  Drink clear fluids if you feel sick to your stomach (nauseous). Do not eat solid food until you feel better.  Only take medicine as told by your doctor.  Keep all doctor visits as told.  Get help right away if:  You are bleeding, leaking fluid, or pieces of tissue come out of your vagina.  You have more pain or cramping.  You keep throwing up (vomiting).  You have pain when you pee (urinate) or have blood in your pee.  You have a fever.  You do not feel your baby moving as much.  You feel very weak or feel like passing out.  You have trouble breathing, with or without belly pain.  You have a very bad headache and belly pain.  You have fluid leaking from your vagina and belly pain.  You keep having watery poop (diarrhea).  Your belly pain does not go away after resting, or the pain gets worse. This information is not intended to replace advice given to you by your health care provider. Make sure you discuss any questions you have with your health care provider. Document Released: 07/02/2009 Document Revised: 02/20/2016 Document Reviewed: 02/10/2013 Elsevier Interactive Patient Education  2018 Elsevier Inc. General Headache Without Cause A headache is pain or discomfort felt around the head or neck area. The specific cause of a headache may not be found. There are many causes and types of headaches. A few common ones are:  Tension headaches.  Migraine headaches.  Cluster  headaches.  Chronic daily headaches.  Follow these instructions at home: Watch your condition for any changes. Take these steps to help with your condition: Managing pain  Take over-the-counter and prescription medicines only as told by your health care provider.  Lie down in a dark, quiet room when you have a headache.  If directed, apply ice to the head and neck area: ? Put ice in a plastic bag. ? Place a towel between your skin and the bag. ? Leave the ice on for 20 minutes, 2-3 times per day.  Use a heating pad or hot shower to apply heat to the head and neck area as told by your health care provider.  Keep lights dim if bright lights bother you or make your headaches worse. Eating and drinking  Eat meals on a regular schedule.  Limit alcohol use.  Decrease the amount of caffeine you drink, or stop drinking caffeine. General instructions  Keep all follow-up visits as told by your health care provider. This is important.  Keep a headache journal to help find out what may trigger your headaches. For example, write down: ? What you eat and drink. ? How much sleep you get. ? Any change to your diet or medicines.  Try massage or other relaxation techniques.  Limit stress.  Sit up straight, and do not tense your muscles.  Do not use tobacco products, including cigarettes, chewing tobacco, or e-cigarettes. If you need help quitting, ask your health care provider.  Exercise regularly as told by your health care provider.  Sleep on a regular schedule. Get 7-9 hours of sleep, or the amount recommended by your health care provider. Contact a health care provider if:  Your symptoms are not helped by medicine.  You have a headache that is different from the usual headache.  You have nausea or you vomit.  You have a fever. Get help right away if:  Your headache becomes severe.  You have repeated vomiting.  You have a stiff neck.  You have a loss of vision.  You  have problems with speech.  You have pain in the eye or ear.  You have muscular weakness or loss of muscle control.  You lose your balance or have trouble walking.  You feel faint or pass out.  You have confusion. This information is not intended to replace advice given to you by your health care provider. Make sure you discuss any questions you have with your health care provider. Document Released: 07/14/2005 Document Revised: 12/20/2015 Document Reviewed: 11/06/2014 Elsevier Interactive Patient Education  [employer].

## 2017-12-09 NOTE — MAU Provider Note (Signed)
History     CSN: [id number]  Arrival date and time: 12/09/17 0000   First Provider Initiated Contact with Patient 12/09/17 0044      Chief Complaint  Patient presents with  . Abdominal Pain   HPI  Julia Armstrong is a 27 y.o. [id number] at [redacted]w[redacted]d who presents with abdominal cramping and headache. Symptoms began as soon as she arrived for her work shift this evening. Works night shift at [employer].  Reports frontal headache that she rates 7/10. Nothing makes better or worse. Has not treated headache.  Lower abdominal cramping is constant. Rates pain 7/10. Has not treated pain. Nothing makes better or worse. Denies diarrhea, constipation, dysuria, vaginal discharge, vaginal bleeding, or LOF. Endorses nausea; states this is not new, has been ongoing during pregnancy. Previously using diclegis; stopped using b/c it wasn't working. Last ate this morning, had a hamburger.   OB History    Gravida  6   Para  3   Term  3   Preterm      AB  2   Living  3     SAB  2   TAB      Ectopic      Multiple      Live Births  3           Past Medical History:  Diagnosis Date  . Gallstones     Past Surgical History:  Procedure Laterality Date  . CESAREAN SECTION    . DILATION AND EVACUATION N/A 02/18/2017   Procedure: DILATATION AND EVACUATION;  Surgeon: Tereso Newcomer, MD;  Location: [facility];  Service: Gynecology;  Laterality: N/A;    Family History  Problem Relation Age of Onset  . Cancer Other   . Cancer Maternal Grandmother        breast cancer    Social History   Tobacco Use  . Smoking status: Never Smoker  . Smokeless tobacco: Never Used  Substance Use Topics  . Alcohol use: No  . Drug use: No    Allergies: No Known Allergies  Medications Prior to Admission  Medication Sig Dispense Refill Last Dose  . acetaminophen (TYLENOL) 325 MG tablet Take 650 mg by mouth every 6 (six) hours as needed for headache.   Past Week at Unknown time  . Prenatal Vit-Fe  Fumarate-FA (PREPLUS) 27-1 MG TABS Take 1 tablet by mouth daily. 30 tablet 13 12/08/2017 at Unknown time  . promethazine (PHENERGAN) 25 MG tablet Take 1 tablet (25 mg total) by mouth every 6 (six) hours as needed for nausea or vomiting. 30 tablet 2 12/08/2017 at Unknown time  . butalbital-acetaminophen-caffeine (FIORICET, ESGIC) 50-325-40 MG tablet Take 1-2 tablets by mouth every 6 (six) hours as needed for headache. (Patient not taking: Reported on 12/01/2017) 20 tablet 0 Not Taking    Review of Systems  Constitutional: Negative.   Gastrointestinal: Positive for abdominal pain, nausea and vomiting. Negative for constipation and diarrhea.  Genitourinary: Negative.   Neurological: Positive for headaches.   Physical Exam   Blood pressure 113/67, pulse 80, temperature 98.1 F (36.7 C), temperature source Oral, resp. rate 20, height  (1.702 m), weight 214 lb (97.1 kg), last menstrual period 08/05/2017.  Physical Exam  Nursing note and vitals reviewed. Constitutional: She is oriented to person, place, and time. She appears well-developed and well-nourished. No distress.  HENT:  Head: Normocephalic and atraumatic.  Eyes: Conjunctivae are normal. Right eye exhibits no discharge. Left eye exhibits no discharge. No scleral icterus.  Neck: Normal range of motion.  Respiratory: Effort normal. No respiratory distress.  GI: Soft. She exhibits no distension. There is no tenderness.  Genitourinary:  Genitourinary Comments: Dilation: Closed Exam by:: Judeth Horn NP   Neurological: She is alert and oriented to person, place, and time.  Skin: Skin is warm and dry. She is not diaphoretic.  Psychiatric: She has a normal mood and affect. Her behavior is normal. Judgment and thought content normal.    MAU Course  Procedures Results for orders placed or performed during the hospital encounter of 12/09/17 (from the past 24 hour(s))  Urinalysis, Routine w reflex microscopic     Status: None    Collection Time: 12/09/17 12:10 AM  Result Value Ref Range   Color, Urine YELLOW YELLOW   APPearance CLEAR CLEAR   Specific Gravity, Urine 1.020 1.005 - 1.030   pH 6.0 5.0 - 8.0   Glucose, UA NEGATIVE NEGATIVE mg/dL   Hgb urine dipstick NEGATIVE NEGATIVE   Bilirubin Urine NEGATIVE NEGATIVE   Ketones, ur NEGATIVE NEGATIVE mg/dL   Protein, ur NEGATIVE NEGATIVE mg/dL   Nitrite NEGATIVE NEGATIVE   Leukocytes, UA NEGATIVE NEGATIVE    MDM FHT 140 by doppler U/a negative Tylenol 1 gm PO for headache Cervix closed/thick  Assessment and Plan  A: 1. Headache in pregnancy, antepartum, second trimester   2. Abdominal cramping affecting pregnancy   3. [redacted] weeks gestation of pregnancy   4. Nausea and vomiting during pregnancy prior to [redacted] weeks gestation    P: Discharge home Rx reglan Discussed eating and drinking throughout the day and tx of future headaches Discussed reasons to return to MAU F/u with OB  Judeth Horn 12/09/2017, 12:44 AM

## 2017-12-22 ENCOUNTER — Other Ambulatory Visit: Payer: Self-pay | Admitting: Nurse Practitioner

## 2017-12-22 ENCOUNTER — Encounter (HOSPITAL_COMMUNITY): Payer: Self-pay

## 2017-12-22 ENCOUNTER — Ambulatory Visit (HOSPITAL_COMMUNITY)
Admission: RE | Admit: 2017-12-22 | Discharge: 2017-12-22 | Disposition: A | Discharge status: Home / Self Care | Payer: Medicaid Other | Source: Ambulatory Visit | Attending: Nurse Practitioner | Admitting: Nurse Practitioner

## 2017-12-22 DIAGNOSIS — Z3A19 19 weeks gestation of pregnancy: Secondary | ICD-10-CM | POA: Diagnosis not present

## 2017-12-22 DIAGNOSIS — E669 Obesity, unspecified: Secondary | ICD-10-CM | POA: Insufficient documentation

## 2017-12-22 DIAGNOSIS — Z3689 Encounter for other specified antenatal screening: Secondary | ICD-10-CM | POA: Insufficient documentation

## 2017-12-22 DIAGNOSIS — O34219 Maternal care for unspecified type scar from previous cesarean delivery: Secondary | ICD-10-CM | POA: Insufficient documentation

## 2017-12-22 DIAGNOSIS — Z8659 Personal history of other mental and behavioral disorders: Secondary | ICD-10-CM | POA: Insufficient documentation

## 2017-12-22 DIAGNOSIS — O99212 Obesity complicating pregnancy, second trimester: Secondary | ICD-10-CM | POA: Diagnosis not present

## 2017-12-22 DIAGNOSIS — Z3480 Encounter for supervision of other normal pregnancy, unspecified trimester: Secondary | ICD-10-CM

## 2017-12-22 DIAGNOSIS — O328XX0 Maternal care for other malpresentation of fetus, not applicable or unspecified: Secondary | ICD-10-CM | POA: Diagnosis not present

## 2017-12-25 ENCOUNTER — Other Ambulatory Visit: Payer: Self-pay

## 2017-12-25 ENCOUNTER — Inpatient Hospital Stay (HOSPITAL_COMMUNITY)
Admission: AD | Admit: 2017-12-25 | Discharge: 2017-12-25 | Disposition: A | Discharge status: Home / Self Care | Payer: Medicaid Other | Source: Ambulatory Visit | Attending: Emergency Medicine | Admitting: Emergency Medicine

## 2017-12-25 ENCOUNTER — Encounter (HOSPITAL_COMMUNITY): Payer: Self-pay | Admitting: *Deleted

## 2017-12-25 DIAGNOSIS — R102 Pelvic and perineal pain: Secondary | ICD-10-CM | POA: Insufficient documentation

## 2017-12-25 DIAGNOSIS — O219 Vomiting of pregnancy, unspecified: Secondary | ICD-10-CM

## 2017-12-25 DIAGNOSIS — Z3A19 19 weeks gestation of pregnancy: Secondary | ICD-10-CM | POA: Diagnosis not present

## 2017-12-25 DIAGNOSIS — Z8659 Personal history of other mental and behavioral disorders: Secondary | ICD-10-CM

## 2017-12-25 DIAGNOSIS — Z79899 Other long term (current) drug therapy: Secondary | ICD-10-CM | POA: Insufficient documentation

## 2017-12-25 DIAGNOSIS — O26892 Other specified pregnancy related conditions, second trimester: Secondary | ICD-10-CM | POA: Diagnosis not present

## 2017-12-25 DIAGNOSIS — O34219 Maternal care for unspecified type scar from previous cesarean delivery: Secondary | ICD-10-CM

## 2017-12-25 DIAGNOSIS — R109 Unspecified abdominal pain: Secondary | ICD-10-CM | POA: Diagnosis present

## 2017-12-25 LAB — URINALYSIS, ROUTINE W REFLEX MICROSCOPIC
Bilirubin Urine: NEGATIVE
GLUCOSE, UA: NEGATIVE mg/dL
HGB URINE DIPSTICK: NEGATIVE
Ketones, ur: NEGATIVE mg/dL
Leukocytes, UA: NEGATIVE
Nitrite: NEGATIVE
Protein, ur: NEGATIVE mg/dL
Specific Gravity, Urine: 1.013 (ref 1.005–1.030)
pH: 7 (ref 5.0–8.0)

## 2017-12-25 NOTE — Discharge Instructions (Signed)
Return to MAU if the pelvic pressure worsens or if having vaginal bleeding or leaking. Continue to drink lots of fluids - 64 oz or more daily. Use wet cloths on your neck at work to try to keep more cool. Keep your appointments in the office.

## 2017-12-25 NOTE — MAU Note (Signed)
First felt like contractions, now is like really bad cramps, started last night.  No bleeding or d/c. No diarrhea or constipation, has been vomiting.

## 2017-12-25 NOTE — MAU Provider Note (Signed)
History     CSN: [id number]  Arrival date and time: 12/25/17 1255   First Provider Initiated Contact with Patient 12/25/17 1342      Chief Complaint  Patient presents with  . Abdominal Pain  . Emesis   HPI Julia Armstrong 27 y.o. [redacted]w[redacted]d  Comes to MAU as she has been having lower abdominal pain like contractions and is feeling pelvic pressure like she is in labor.  Went home from work last night and was too tired to come to MAU but her symptoms have persisted throughout the day today.  Feels very tired at work.  Is very hot in the warehouse.  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  No vaginal bleeding.  No leaking of fluid.  Having some pain when she urinates. Has vomited 3 times today and took Phenergan and vomited one hour later.  Has been able to drink water without vomiting.    OB History    Gravida  6   Para  3   Term  3   Preterm      AB  2   Living  3     SAB  2   TAB      Ectopic      Multiple      Live Births  3           Past Medical History:  Diagnosis Date  . Gallstones     Past Surgical History:  Procedure Laterality Date  . CESAREAN SECTION     C/S x 3  . DILATION AND EVACUATION N/A 02/18/2017   Procedure: DILATATION AND EVACUATION;  Surgeon: Tereso Newcomer, MD;  Location: [facility];  Service: Gynecology;  Laterality: N/A;  . WISDOM TOOTH EXTRACTION      Family History  Problem Relation Age of Onset  . Cancer Other   . Cancer Maternal Grandmother        breast cancer    Social History   Tobacco Use  . Smoking status: Never Smoker  . Smokeless tobacco: Never Used  Substance Use Topics  . Alcohol use: No  . Drug use: No    Allergies: No Known Allergies  Medications Prior to Admission  Medication Sig Dispense Refill Last Dose  . acetaminophen (TYLENOL) 325 MG tablet Take 650 mg by mouth every 6 (six) hours as needed for headache.   Past Week at Unknown time  . Prenatal Vit-Fe Fumarate-FA (PREPLUS) 27-1 MG TABS Take 1 tablet  by mouth daily. 30 tablet 13 Past Week at Unknown time  . butalbital-acetaminophen-caffeine (FIORICET, ESGIC) 50-325-40 MG tablet Take 1-2 tablets by mouth every 6 (six) hours as needed for headache. (Patient not taking: Reported on 12/01/2017) 20 tablet 0 Not Taking at Unknown time  . metoCLOPramide (REGLAN) 10 MG tablet Take 1 tablet (10 mg total) by mouth every 8 (eight) hours as needed for nausea. (Patient not taking: Reported on 12/22/2017) 30 tablet 0 Not Taking at Unknown time  . promethazine (PHENERGAN) 25 MG tablet Take 1 tablet (25 mg total) by mouth every 6 (six) hours as needed for nausea or vomiting. (Patient not taking: Reported on 12/22/2017) 30 tablet 2 Not Taking at Unknown time    Review of Systems Physical Exam   Blood pressure (!) 119/58, pulse 82, temperature 98.4 F (36.9 C), temperature source Oral, resp. rate 18, weight 212 lb 12 oz (96.5 kg), last menstrual period 08/05/2017, SpO2 100 %.  Physical Exam  Nursing note and vitals reviewed. Constitutional:  She is oriented to person, place, and time. She appears well-developed and well-nourished.  HENT:  Head: Normocephalic.  Eyes: EOM are normal.  Neck: Neck supple.  Respiratory: Effort normal.  GI: Soft. There is no tenderness. There is no rebound and no guarding.  No contractions felt on abdominal exam.  No vomiting in MAU.  Genitourinary:  Genitourinary Comments: Cervical exam - cervix closed - no presenting part felt in the pelvis.  Reviewed ultrasound done this week - cervix was 3.5 cm and baby was footling breech.  Musculoskeletal: Normal range of motion.  Neurological: She is alert and oriented to person, place, and time.  Skin: Skin is warm and dry.  Psychiatric: She has a normal mood and affect.    MAU Course  Procedures Results for orders placed or performed during the hospital encounter of 12/25/17 (from the past 24 hour(s))  Urinalysis, Routine w reflex microscopic     Status: None   Collection Time:  12/25/17  1:11 PM  Result Value Ref Range   Color, Urine YELLOW YELLOW   APPearance CLEAR CLEAR   Specific Gravity, Urine 1.013 1.005 - 1.030   pH 7.0 5.0 - 8.0   Glucose, UA NEGATIVE NEGATIVE mg/dL   Hgb urine dipstick NEGATIVE NEGATIVE   Bilirubin Urine NEGATIVE NEGATIVE   Ketones, ur NEGATIVE NEGATIVE mg/dL   Protein, ur NEGATIVE NEGATIVE mg/dL   Nitrite NEGATIVE NEGATIVE   Leukocytes, UA NEGATIVE NEGATIVE    MDM Declines any zofran here in MAU.  Feels fine when resting in bed but when up walking, then feeling pressure and nausea.  Has phenergan at home and reviewed how to take it.  Will go home and rest - note given to be out of work tonight.  Works again on Monday night.  Denies any vaginal itching or burning.  Given good instructions that we only have a snapshot of what is going on today and her cervix is closed.  If the pain worsens or if she has vaginal bleeding or leaking she should return for reevaluation.  Client voices understanding.  Assessment and Plan  Vomiting in pregnancy Pelvic pressure - likely normal in pregnancy -   Plan Return to MAU if the pelvic pressure worsens or if having vaginal bleeding or leaking. Continue to drink lots of fluids - 64 oz or more daily. Use wet cloths on your neck at work to try to keep more cool. Keep your appointments in the office.  Terri L Burleson 12/25/2017, 2:08 PM

## 2017-12-31 ENCOUNTER — Inpatient Hospital Stay (HOSPITAL_COMMUNITY)
Admission: AD | Admit: 2017-12-31 | Discharge: 2017-12-31 | Disposition: A | Discharge status: Home / Self Care | Payer: Medicaid Other | Source: Ambulatory Visit | Attending: Obstetrics & Gynecology | Admitting: Obstetrics & Gynecology

## 2017-12-31 ENCOUNTER — Encounter (HOSPITAL_COMMUNITY): Payer: Self-pay | Admitting: *Deleted

## 2017-12-31 DIAGNOSIS — O9989 Other specified diseases and conditions complicating pregnancy, childbirth and the puerperium: Secondary | ICD-10-CM | POA: Diagnosis not present

## 2017-12-31 DIAGNOSIS — O26892 Other specified pregnancy related conditions, second trimester: Secondary | ICD-10-CM | POA: Insufficient documentation

## 2017-12-31 DIAGNOSIS — R42 Dizziness and giddiness: Secondary | ICD-10-CM | POA: Diagnosis not present

## 2017-12-31 DIAGNOSIS — Z3A20 20 weeks gestation of pregnancy: Secondary | ICD-10-CM | POA: Diagnosis not present

## 2017-12-31 LAB — URINALYSIS, ROUTINE W REFLEX MICROSCOPIC
Bilirubin Urine: NEGATIVE
Glucose, UA: NEGATIVE mg/dL
HGB URINE DIPSTICK: NEGATIVE
Ketones, ur: NEGATIVE mg/dL
Leukocytes, UA: NEGATIVE
Nitrite: NEGATIVE
PH: 6 (ref 5.0–8.0)
Protein, ur: NEGATIVE mg/dL
SPECIFIC GRAVITY, URINE: 1.024 (ref 1.005–1.030)

## 2017-12-31 LAB — CBC
HEMATOCRIT: 34.1 % — AB (ref 36.0–46.0)
Hemoglobin: 11.5 g/dL — ABNORMAL LOW (ref 12.0–15.0)
MCH: 26.4 pg (ref 26.0–34.0)
MCHC: 33.7 g/dL (ref 30.0–36.0)
MCV: 78.4 fL (ref 78.0–100.0)
PLATELETS: 224 10*3/uL (ref 150–400)
RBC: 4.35 MIL/uL (ref 3.87–5.11)
RDW: 14.5 % (ref 11.5–15.5)
WBC: 6.6 10*3/uL (ref 4.0–10.5)

## 2017-12-31 LAB — GLUCOSE, CAPILLARY: Glucose-Capillary: 101 mg/dL — ABNORMAL HIGH (ref 65–99)

## 2017-12-31 MED ORDER — ACETAMINOPHEN 500 MG PO TABS
1000.0000 mg | ORAL_TABLET | Freq: Once | ORAL | Status: AC
  Administered 2017-12-31: 1000 mg via ORAL
  Filled 2017-12-31: qty 2

## 2017-12-31 NOTE — MAU Provider Note (Addendum)
History     CSN: [id number]  Arrival date and time: 12/31/17 [id number]   First Provider Initiated Contact with Patient 12/31/17 2029      Chief Complaint  Patient presents with  . Dizziness   HPI Julia Armstrong is a 27 y.o. [phone] at 2738w4d who presents with a headache after a fall. She states around 1pm she bent down to pick up clothes and thinks she passed out. She states she hit her head on a cabinet in the hallway and is unsure how long she was unconscious. She states her children went to another house down the street to get help and she woke up when they arrived. She states she thinks she passed out on Tuesday too but did not come in. She states now she is having a headache and it is sore behind her right ear where she feels like she hit it on the cabinet. She reports eating a large lunch before she passed out and has had 5 bottles of water today. She states after she fell, she took a nap and still had a headache so she came in. She denies any vaginal bleeding or leaking of fluid. Reports good fetal movement.   OB History    Gravida  6   Para  3   Term  3   Preterm      AB  2   Living  3     SAB  2   TAB      Ectopic      Multiple      Live Births  3           Past Medical History:  Diagnosis Date  . Gallstones     Past Surgical History:  Procedure Laterality Date  . CESAREAN SECTION     C/S x 3  . DILATION AND EVACUATION N/A 02/18/2017   Procedure: DILATATION AND EVACUATION;  Surgeon: Tereso NewcomerAnyanwu, Ugonna A, MD;  Location: [facility];  Service: Gynecology;  Laterality: N/A;  . WISDOM TOOTH EXTRACTION      Family History  Problem Relation Age of Onset  . Cancer Other   . Cancer Maternal Grandmother        breast cancer    Social History   Tobacco Use  . Smoking status: Never Smoker  . Smokeless tobacco: Never Used  Substance Use Topics  . Alcohol use: No  . Drug use: No    Allergies: No Known Allergies  Medications Prior to Admission  Medication  Sig Dispense Refill Last Dose  . acetaminophen (TYLENOL) 325 MG tablet Take 650 mg by mouth every 6 (six) hours as needed for headache.   Past Week at Unknown time  . metoCLOPramide (REGLAN) 10 MG tablet Take 1 tablet (10 mg total) by mouth every 8 (eight) hours as needed for nausea. 30 tablet 0 Past Month at Unknown time  . Prenatal Vit-Fe Fumarate-FA (PREPLUS) 27-1 MG TABS Take 1 tablet by mouth daily. 30 tablet 13 12/31/2017 at Unknown time  . promethazine (PHENERGAN) 25 MG tablet Take 1 tablet (25 mg total) by mouth every 6 (six) hours as needed for nausea or vomiting. 30 tablet 2 Past Month at Unknown time    Review of Systems  Constitutional: Negative.  Negative for fatigue and fever.  HENT: Negative.   Respiratory: Negative.  Negative for shortness of breath.   Cardiovascular: Negative.  Negative for chest pain.  Gastrointestinal: Negative.  Negative for abdominal pain, constipation, diarrhea, nausea and vomiting.  Genitourinary: Negative.  Negative for dysuria, vaginal bleeding and vaginal discharge.  Neurological: Positive for dizziness, syncope and headaches.   Physical Exam   Blood pressure 113/70, pulse 90, last menstrual period 08/05/2017.  Patient Vitals for the past 24 hrs:  BP Pulse  12/31/17 2059 113/70 90  12/31/17 2057 100/71 71  12/31/17 2054 112/60 73   Physical Exam  Nursing note and vitals reviewed. Constitutional: She is oriented to person, place, and time. She appears well-developed and well-nourished. No distress.  HENT:  Head: Normocephalic.  Eyes: Pupils are equal, round, and reactive to light.  Cardiovascular: Normal rate, regular rhythm and normal heart sounds.  Respiratory: Effort normal and breath sounds normal. No respiratory distress.  GI: Soft. Bowel sounds are normal. She exhibits no distension. There is no tenderness.  Neurological: She is alert and oriented to person, place, and time. She displays normal reflexes. No cranial nerve deficit.  Coordination normal.  Skin: Skin is warm and dry.  Psychiatric: She has a normal mood and affect. Her behavior is normal. Judgment and thought content normal.   FHR: 142 bpm  MAU Course  Procedures Results for orders placed or performed during the hospital encounter of 12/31/17 (from the past 24 hour(s))  Urinalysis, Routine w reflex microscopic     Status: None   Collection Time: 12/31/17  8:00 PM  Result Value Ref Range   Color, Urine YELLOW YELLOW   APPearance CLEAR CLEAR   Specific Gravity, Urine 1.024 1.005 - 1.030   pH 6.0 5.0 - 8.0   Glucose, UA NEGATIVE NEGATIVE mg/dL   Hgb urine dipstick NEGATIVE NEGATIVE   Bilirubin Urine NEGATIVE NEGATIVE   Ketones, ur NEGATIVE NEGATIVE mg/dL   Protein, ur NEGATIVE NEGATIVE mg/dL   Nitrite NEGATIVE NEGATIVE   Leukocytes, UA NEGATIVE NEGATIVE  CBC     Status: Abnormal   Collection Time: 12/31/17  8:50 PM  Result Value Ref Range   WBC 6.6 4.0 - 10.5 K/uL   RBC 4.35 3.87 - 5.11 MIL/uL   Hemoglobin 11.5 (L) 12.0 - 15.0 g/dL   HCT 16.1 (L) 09.6 - 04.5 %   MCV 78.4 78.0 - 100.0 fL   MCH 26.4 26.0 - 34.0 pg   MCHC 33.7 30.0 - 36.0 g/dL   RDW 40.9 81.1 - 91.4 %   Platelets 224 150 - 400 K/uL  Glucose, capillary     Status: Abnormal   Collection Time: 12/31/17  9:06 PM  Result Value Ref Range   Glucose-Capillary 101 (H) 65 - 99 mg/dL   MDM UA CBC CBG Orthostatic vital signs Tylenol 1000mg  PO  Normal neurological exam Patient eating and drinking normally while in MAU Consulted with Dr. Erin Fulling- ok to discharge patient home.  Assessment and Plan   1. Dizziness   2. [redacted] weeks gestation of pregnancy    -Discharge home in stable condition -Encouraged patient to increase fluid and food intake. Cautioned patient to change positions slowly. -Patient advised [facility] care -Patient may return to MAU as needed or if her condition were to change or worsen  Rolm Bookbinder  CNM 12/31/2017, 8:29 PM

## 2017-12-31 NOTE — MAU Note (Signed)
Pt was walking down the hallway and hit the left side of her  head on the wall. Did not have anyone to watch her children so she went to sleep. Today at 1300, she bent over to pick up clothes out of the basket and became light headed and hit her head on a curio cabinet. She says she had a knot on the right side of her head behind her right ear but it has gone down throughout the day; but still very tender.  She did not take anything for the pain.

## 2017-12-31 NOTE — Discharge Instructions (Signed)
Dizziness °Dizziness is a common problem. It makes you feel unsteady or light-headed. You may feel like you are about to pass out (faint). Dizziness can lead to getting hurt if you stumble or fall. Dizziness can be caused by many things, including: °· Medicines. °· Not having enough water in your body (dehydration). °· Illness. ° °Follow these instructions at home: °Eating and drinking °· Drink enough fluid to keep your pee (urine) clear or pale yellow. This helps to keep you from getting dehydrated. Try to drink more clear fluids, such as water. °· Do not drink alcohol. °· Limit how much caffeine you drink or eat, if your doctor tells you to do that. °· Limit how much salt (sodium) you drink or eat, if your doctor tells you to do that. °Activity °· Avoid making quick movements. °? When you stand up from sitting in a chair, steady yourself until you feel okay. °? In the morning, first sit up on the side of the bed. When you feel okay, stand slowly while you hold onto something. Do this until you know that your balance is fine. °· If you need to stand in one place for a long time, move your legs often. Tighten and relax the muscles in your legs while you are standing. °· Do not drive or use heavy machinery if you feel dizzy. °· Avoid bending down if you feel dizzy. Place items in your home so you can reach them easily without leaning over. °Lifestyle °· Do not use any products that contain nicotine or tobacco, such as cigarettes and e-cigarettes. If you need help quitting, ask your doctor. °· Try to lower your stress level. You can do this by using methods such as yoga or meditation. Talk with your doctor if you need help. °General instructions °· Watch your dizziness for any changes. °· Take over-the-counter and prescription medicines only as told by your doctor. Talk with your doctor if you think that you are dizzy because of a medicine that you are taking. °· Tell a friend or a family member that you are feeling  dizzy. If he or she notices any changes in your behavior, have this person call your doctor. °· Keep all follow-up visits as told by your doctor. This is important. °Contact a doctor if: °· Your dizziness does not go away. °· Your dizziness or light-headedness gets worse. °· You feel sick to your stomach (nauseous). °· You have trouble hearing. °· You have new symptoms. °· You are unsteady on your feet. °· You feel like the room is spinning. °Get help right away if: °· You throw up (vomit) or have watery poop (diarrhea), and you cannot eat or drink anything. °· You have trouble: °? Talking. °? Walking. °? Swallowing. °? Using your arms, hands, or legs. °· You feel generally weak. °· You are not thinking clearly, or you have trouble forming sentences. A friend or family member may notice this. °· You have: °? Chest pain. °? Pain in your belly (abdomen). °? Shortness of breath. °? Sweating. °· Your vision changes. °· You are bleeding. °· You have a very bad headache. °· You have neck pain or a stiff neck. °· You have a fever. °These symptoms may be an emergency. Do not wait to see if the symptoms will go away. Get medical help right away. Call your local emergency services (911 in the [place] Do not drive yourself to the hospital. °Summary °· Dizziness makes you feel unsteady or light-headed. You may   feel like you are about to pass out (faint).  Drink enough fluid to keep your pee (urine) clear or pale yellow. Do not drink alcohol.  Avoid making quick movements if you feel dizzy.  Watch your dizziness for any changes. This information is not intended to replace advice given to you by your health care provider. Make sure you discuss any questions you have with your health care provider. Document Released: 07/03/2011 Document Revised: 07/31/2016 Document Reviewed: 07/31/2016 Elsevier Interactive Patient Education  2017 [employer]  Eating Plan for Pregnant Women While you are pregnant, your body will  require additional nutrition to help support your growing baby. It is recommended that you consume:  150 additional calories each day during your first trimester.  300 additional calories each day during your second trimester.  300 additional calories each day during your third trimester.  Eating a healthy, well-balanced diet is very important for your health and for your baby's health. You also have a higher need for some vitamins and minerals, such as folic acid, calcium, iron, and vitamin D. What do I need to know about eating during pregnancy?  Do not try to lose weight or go on a diet during pregnancy.  Choose healthy, nutritious foods. Choose  of a sandwich with a glass of milk instead of a candy bar or a high-calorie sugar-sweetened beverage.  Limit your overall intake of foods that have "empty calories." These are foods that have little nutritional value, such as sweets, desserts, candies, sugar-sweetened beverages, and fried foods.  Eat a variety of foods, especially fruits and vegetables.  Take a prenatal vitamin to help meet the additional needs during pregnancy, specifically for folic acid, iron, calcium, and vitamin D.  Remember to stay active. Ask your health care provider for exercise recommendations that are specific to you.  Practice good food safety and cleanliness, such as washing your hands before you eat and after you prepare raw meat. This helps to prevent foodborne illnesses, such as listeriosis, that can be very dangerous for your baby. Ask your health care provider for more information about listeriosis. What does 150 extra calories look like? Healthy options for an additional 150 calories each day could be any of the following:  Plain low-fat yogurt (6-8 oz) with  cup of berries.  1 apple with 2 teaspoons of peanut butter.  Cut-up vegetables with  cup of hummus.  Low-fat chocolate milk (8 oz or 1 cup).  1 string cheese with 1 medium orange.   of a  peanut butter and jelly sandwich on whole-wheat bread (1 tsp of peanut butter).  For 300 calories, you could eat two of those healthy options each day. What is a healthy amount of weight to gain? The recommended amount of weight for you to gain is based on your pre-pregnancy BMI. If your pre-pregnancy BMI was:  Less than 18 (underweight), you should gain 28-40 lb.  18-24.9 (normal), you should gain 25-35 lb.  25-29.9 (overweight), you should gain 15-25 lb.  Greater than 30 (obese), you should gain 11-20 lb.  What if I am having twins or multiples? Generally, pregnant women who will be having twins or multiples may need to increase their daily calories by 300-600 calories each day. The recommended range for total weight gain is 25-54 lb, depending on your pre-pregnancy BMI. Talk with your health care provider for specific guidance about additional nutritional needs, weight gain, and exercise during your pregnancy. What foods can I eat? Grains Any grains. Try to  choose whole grains, such as whole-wheat bread, oatmeal, or brown rice. Vegetables Any vegetables. Try to eat a variety of colors and types of vegetables to get a full range of vitamins and minerals. Remember to wash your vegetables well before eating. Fruits Any fruits. Try to eat a variety of colors and types of fruit to get a full range of vitamins and minerals. Remember to wash your fruits well before eating. Meats and Other Protein Sources Lean meats, including chicken, [place], fish, and lean cuts of beef, veal, or pork. Make sure that all meats are cooked to "well done." Tofu. Tempeh. Beans. Eggs. Peanut butter and other nut butters. Seafood, such as shrimp, crab, and lobster. If you choose fish, select types that are higher in omega-3 fatty acids, including salmon, herring, mussels, trout, sardines, and pollock. Make sure that all meats are cooked to food-safe temperatures. Dairy Pasteurized milk and milk alternatives.  Pasteurized yogurt and pasteurized cheese. Cottage cheese. Sour cream. Beverages Water. Juices that contain 100% fruit juice or vegetable juice. Caffeine-free teas and decaffeinated coffee. Drinks that contain caffeine are okay to drink, but it is better to avoid caffeine. Keep your total caffeine intake to less than 200 mg each day (12 oz of coffee, tea, or soda) or as directed by your health care provider. Condiments Any pasteurized condiments. Sweets and Desserts Any sweets and desserts. Fats and Oils Any fats and oils. The items listed above may not be a complete list of recommended foods or beverages. Contact your dietitian for more options. What foods are not recommended? Vegetables Unpasteurized (raw) vegetable juices. Fruits Unpasteurized (raw) fruit juices. Meats and Other Protein Sources Cured meats that have nitrates, such as bacon, salami, and hotdogs. Luncheon meats, bologna, or other deli meats (unless they are reheated until they are steaming hot). Refrigerated pate, meat spreads from a meat counter, smoked seafood that is found in the refrigerated section of a store. Raw fish, such as sushi or sashimi. High mercury content fish, such as tilefish, shark, swordfish, and king mackerel. Raw meats, such as tuna or beef tartare. Undercooked meats and poultry. Make sure that all meats are cooked to food-safe temperatures. Dairy Unpasteurized (raw) milk and any foods that have raw milk in them. Soft cheeses, such as feta, queso blanco, queso fresco, Brie, Camembert cheeses, blue-veined cheeses, and Panela cheese (unless it is made with pasteurized milk, which must be stated on the label). Beverages Alcohol. Sugar-sweetened beverages, such as sodas, teas, or energy drinks. Condiments Homemade fermented foods and drinks, such as pickles, sauerkraut, or kombucha drinks. (Store-bought pasteurized versions of these are okay.) Other Salads that are made in the store, such as ham salad,  chicken salad, egg salad, tuna salad, and seafood salad. The items listed above may not be a complete list of foods and beverages to avoid. Contact your dietitian for more information. This information is not intended to replace advice given to you by your health care provider. Make sure you discuss any questions you have with your health care provider. Document Released: 04/28/2014 Document Revised: 12/20/2015 Document Reviewed: 12/27/2013 Elsevier Interactive Patient Education  [employer]2018 Elsevier Inc.

## 2018-01-05 ENCOUNTER — Ambulatory Visit (INDEPENDENT_AMBULATORY_CARE_PROVIDER_SITE_OTHER): Payer: Medicaid Other | Admitting: Nurse Practitioner

## 2018-01-05 ENCOUNTER — Encounter: Payer: Self-pay | Admitting: Nurse Practitioner

## 2018-01-05 ENCOUNTER — Ambulatory Visit: Payer: Self-pay | Admitting: Clinical

## 2018-01-05 ENCOUNTER — Encounter: Payer: Self-pay | Admitting: Family Medicine

## 2018-01-05 VITALS — BP 114/69 | HR 98 | Wt 211.8 lb

## 2018-01-05 DIAGNOSIS — E669 Obesity, unspecified: Secondary | ICD-10-CM

## 2018-01-05 DIAGNOSIS — Z8659 Personal history of other mental and behavioral disorders: Secondary | ICD-10-CM

## 2018-01-05 DIAGNOSIS — O34219 Maternal care for unspecified type scar from previous cesarean delivery: Secondary | ICD-10-CM

## 2018-01-05 DIAGNOSIS — Z3482 Encounter for supervision of other normal pregnancy, second trimester: Secondary | ICD-10-CM

## 2018-01-05 DIAGNOSIS — F4323 Adjustment disorder with mixed anxiety and depressed mood: Secondary | ICD-10-CM

## 2018-01-05 DIAGNOSIS — Z3480 Encounter for supervision of other normal pregnancy, unspecified trimester: Secondary | ICD-10-CM

## 2018-01-05 MED ORDER — METOCLOPRAMIDE HCL 10 MG PO TABS: 10.0000 mg | ORAL_TABLET | Freq: Three times a day (TID) | ORAL | 1 refills | Status: DC | PRN

## 2018-01-05 NOTE — Progress Notes (Signed)
Subjective:  Julia Armstrong is a 27 y.o. [phone] at [redacted]w[redacted]d being seen today for ongoing prenatal care.  She is currently monitored for the following issues for this low-risk pregnancy and has Supervision of other normal pregnancy, antepartum; Previous cesarean delivery, antepartum; History of depression; and Obesity (BMI 35.0-39.9 without comorbidity) on their problem list.    Patient reports nausea.  Has taken Reglan once today.  Generally just takes it once a day. Contractions: Not present. Vag. Bleeding: None.  Movement: Present. Denies leaking of fluid.  Client has been out of work several times and has had 2 MAU visits since her last prenatal visit.  Reviewed MAU notes.  Requesting a note for work.  Advised we can give a note but cannot medically take her out of work.  Note written requesting work where she is seated.  She has not talked with HR department at her job.  Advised her to see them to review her work benefits.  She has been at the job less than one year.  She did not go to work last night because she did not feel like she could do it.  Client did see behavioral health specialist here in the office today and info was given to her on depression.  Denies begin depressed.  The following portions of the patient's history were reviewed and updated as appropriate: allergies, current medications, past family history, past medical history, past social history, past surgical history and problem list. Problem list updated.  Objective:   Vitals:   01/05/18 1621  BP: 114/69  Pulse: 98  Weight: 211 lb 12.8 oz (96.1 kg)    Fetal Status: Fetal Heart Rate (bpm): 150 Fundal Height: 23 cm Movement: Present     General:  Alert, oriented and cooperative. Patient is in no acute distress.  Skin: Skin is warm and dry. No rash noted.   Cardiovascular: Normal heart rate noted  Respiratory: Normal respiratory effort, no problems with respiration noted  Abdomen: Soft, gravid, appropriate for  gestational age. Pain/Pressure: Present     Pelvic:  Cervical exam deferred        Extremities: Normal range of motion.  Edema: Trace  Mental Status: Normal mood and affect. Normal behavior. Normal judgment and thought content.  Heart - regular rate, no murmur, normal rate,  Lungs - clear to auscultation bilaterally    Assessment and Plan:  Pregnancy: [id number] at [redacted]w[redacted]d  1. Supervision of other normal pregnancy, antepartum Nausea also - taking reglan.  Refilled reglan and advised Q8H for nausea.  Still has phenergan. Today Is not vomiting but feels bad. Discussed contraception -  Advised IUD but client is unsure.  She had signed papers late for BTL in last pregnancy.  She did not return for a tubal after discharge.  Needs to talk to partner.  Is not sure about BTL this time. Drink at least 8 8-oz glasses of water every day. Note given to take to work.  Advised to see HR at her job to discuss her benefits for this pregnancy.  2. Previous cesarean delivery, antepartum Plans C/S this pregnancy  3. History of depression Saw behavioral health provider in clinic today.  4. Obesity (BMI 35.0-39.9 without comorbidity)   Preterm labor symptoms and general obstetric precautions including but not limited to vaginal bleeding, contractions, leaking of fluid and fetal movement were reviewed in detail with the patient. Please refer to After Visit Summary for other counseling recommendations.  Return in about 2 weeks (around 01/19/2018).  Nolene BernheimERRI BURLESON, RN, MSN, NP-BC Nurse Practitioner, [facility], [facility] 01/05/2018 4:50 PM

## 2018-01-05 NOTE — BH Specialist Note (Signed)
Integrated Behavioral Health Initial Visit  MRN: [id number] Name: Julia Armstrong  Number of Integrated Behavioral Health Clinician visits:: 1/6 Session Start time: 4:10  Session End time: 4:20 Total time: 20 minutes  Type of Service: Integrated Behavioral Health- Individual/Family Interpretor:No. Interpretor Name and Language: n/a   Warm Hand Off Completed.       SUBJECTIVE: Julia Armstrong is a 27 y.o. female accompanied by n/a Patient was referred by Nolene Bernheimerri Burleson, NP for past symptoms of depression and anxiety Patient reports the following symptoms/concerns: Pt states her primary concern today is feeling tired, sick, irritable, and sleep difficulty, attributed to current pregnancy while working 3rd shift; patient open to receiving educational materials about preventing symptoms of depression and anxiety in pregnancy.  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 Maintain reduction of symptoms of: anxiety and depression  INTERVENTIONS: Interventions utilized: Psychoeducation and/or Health Education  Standardized Assessments completed: GAD-7 and PHQ 9  ASSESSMENT: Patient currently experiencing Adjustment  Disorder with mixed anxious and depressed mood   Patient may benefit from psychoeducation regarding maintaining reduction of symptoms of anxiety and depression. [name].  PLAN: 1. Follow up with behavioral health clinician on : As needed 2. Behavioral recommendations:  -Consider reading educational  material regarding maintaining reduction of symptoms of anxiety and depression 3. Referral(s): Integrated Behavioral Health Services (In Clinic) 4. "From scale of 1-10, how likely are you to follow plan?": -  Julia LipsJamie C Raquell Richer, LCSW  Depression screen [facility] 01/05/2018 11/03/2017 03/04/2017  Decreased Interest 2 2 2   Down, Depressed, Hopeless 0 1 1  PHQ - 2 Score 2 3 3   Altered sleeping 0 1 1  Tired, decreased energy 3 1 3   Change in appetite 1 1 1   Feeling bad or failure about yourself  0 3 0  Trouble concentrating 0 3 0  Moving slowly or fidgety/restless 0 0 0  Suicidal thoughts 0 0 0  PHQ-9 Score 6 12 8    GAD 7 : Generalized Anxiety Score 01/05/2018 11/03/2017 03/04/2017  Nervous, Anxious, on Edge 0 0 0  Control/stop worrying 2 1 0  Worry too much - different things 0 1 0  Trouble relaxing 0 1 3  Restless 0 0 0  Easily annoyed or irritable 2 1 3   Afraid - awful might happen 2 0 0  Total GAD 7 Score 6 4 6

## 2018-01-05 NOTE — Patient Instructions (Signed)
Second Trimester of Pregnancy The second trimester is from week 13 through week 28, month 4 through 6. This is often the time in pregnancy that you feel your best. Often times, morning sickness has lessened or quit. You may have more energy, and you may get hungry more often. Your unborn baby (fetus) is growing rapidly. At the end of the sixth month, he or she is about 9 inches long and weighs about 1 pounds. You will likely feel the baby move (quickening) between 18 and 20 weeks of pregnancy. Follow these instructions at home:  Avoid all smoking, herbs, and alcohol. Avoid drugs not approved by your doctor.  Do not use any tobacco products, including cigarettes, chewing tobacco, and electronic cigarettes. If you need help quitting, ask your doctor. You may get counseling or other support to help you quit.  Only take medicine as told by your doctor. Some medicines are safe and some are not during pregnancy.  Exercise only as told by your doctor. Stop exercising if you start having cramps.  Eat regular, healthy meals.  Wear a good support bra if your breasts are tender.  Do not use hot tubs, steam rooms, or saunas.  Wear your seat belt when driving.  Avoid raw meat, uncooked cheese, and liter boxes and soil used by cats.  Take your prenatal vitamins.  Take 1500-2000 milligrams of calcium daily starting at the 20th week of pregnancy until you deliver your baby.  Try taking medicine that helps you poop (stool softener) as needed, and if your doctor approves. Eat more fiber by eating fresh fruit, vegetables, and whole grains. Drink enough fluids to keep your pee (urine) clear or pale yellow.  Take warm water baths (sitz baths) to soothe pain or discomfort caused by hemorrhoids. Use hemorrhoid cream if your doctor approves.  If you have puffy, bulging veins (varicose veins), wear support hose. Raise (elevate) your feet for 15 minutes, 3-4 times a day. Limit salt in your diet.  Avoid heavy  lifting, wear low heals, and sit up straight.  Rest with your legs raised if you have leg cramps or low back pain.  Visit your dentist if you have not gone during your pregnancy. Use a soft toothbrush to brush your teeth. Be gentle when you floss.  You can have sex (intercourse) unless your doctor tells you not to.  Go to your doctor visits. Get help if:  You feel dizzy.  You have mild cramps or pressure in your lower belly (abdomen).  You have a nagging pain in your belly area.  You continue to feel sick to your stomach (nauseous), throw up (vomit), or have watery poop (diarrhea).  You have bad smelling fluid coming from your vagina.  You have pain with peeing (urination). Get help right away if:  You have a fever.  You are leaking fluid from your vagina.  You have spotting or bleeding from your vagina.  You have severe belly cramping or pain.  You lose or gain weight rapidly.  You have trouble catching your breath and have chest pain.  You notice sudden or extreme puffiness (swelling) of your face, hands, ankles, feet, or legs.  You have not felt the baby move in over an hour.  You have severe headaches that do not go away with medicine.  You have vision changes. This information is not intended to replace advice given to you by your health care provider. Make sure you discuss any questions you have with your health care   provider. Document Released: 10/08/2009 Document Revised: 12/20/2015 Document Reviewed: 09/14/2012 Elsevier Interactive Patient Education  2017 Elsevier Inc.  

## 2018-01-19 ENCOUNTER — Other Ambulatory Visit: Payer: Self-pay

## 2018-01-19 ENCOUNTER — Ambulatory Visit (INDEPENDENT_AMBULATORY_CARE_PROVIDER_SITE_OTHER): Payer: Medicaid Other | Admitting: Advanced Practice Midwife

## 2018-01-19 VITALS — BP 106/44 | HR 72 | Wt 211.0 lb

## 2018-01-19 DIAGNOSIS — R55 Syncope and collapse: Secondary | ICD-10-CM

## 2018-01-19 DIAGNOSIS — O219 Vomiting of pregnancy, unspecified: Secondary | ICD-10-CM

## 2018-01-19 DIAGNOSIS — O2612 Low weight gain in pregnancy, second trimester: Secondary | ICD-10-CM

## 2018-01-19 MED ORDER — DOXYLAMINE-PYRIDOXINE 10-10 MG PO TBEC: DELAYED_RELEASE_TABLET | ORAL | 5 refills | Status: DC

## 2018-01-19 MED ORDER — PROMETHAZINE HCL 25 MG RE SUPP: 25.0000 mg | Freq: Four times a day (QID) | RECTAL | 5 refills | Status: DC | PRN

## 2018-01-19 MED ORDER — ENSURE COMPLETE PO LIQD: 237.0000 mL | Freq: Two times a day (BID) | ORAL | 10 refills | Status: DC

## 2018-01-19 MED ORDER — METOCLOPRAMIDE HCL 10 MG PO TABS: 10.0000 mg | ORAL_TABLET | Freq: Three times a day (TID) | ORAL | 1 refills | Status: DC | PRN

## 2018-01-19 MED ORDER — ONDANSETRON HCL 4 MG PO TABS: 4.0000 mg | ORAL_TABLET | Freq: Three times a day (TID) | ORAL | 3 refills | Status: DC | PRN

## 2018-01-19 NOTE — Progress Notes (Signed)
PRENATAL VISIT NOTE  Subjective:  Julia Armstrong is a 27 y.o. [phone] at 3238w3d being seen today for ongoing prenatal care.  She is currently monitored for the following issues for this low-risk pregnancy and has Supervision of other normal pregnancy, antepartum; Previous cesarean delivery, antepartum; History of depression; and Obesity (BMI 35.0-39.9 without comorbidity) on their problem list.  Patient reports nausea, vomiting and syncope and missed work.  Contractions: Not present. Vag. Bleeding: None.   . Denies leaking of fluid.   The following portions of the patient's history were reviewed and updated as appropriate: allergies, current medications, past family history, past medical history, past social history, past surgical history and problem list. Problem list updated.  Objective:   Vitals:   01/19/18 1648  BP: (!) 106/44  Pulse: 72  Weight: 211 lb (95.7 kg)    Fetal Status:           VS reviewed, nursing note reviewed,  Constitutional: well developed, well nourished, no distress HEENT: normocephalic CV: normal rate HEART: normal rate, heart sounds, regular rhythm RESP: normal effort, lung sounds clear and equal bilaterally Abdomen: soft Neuro: alert and oriented x 3 Skin: warm, dry Psych: affect normal   Assessment and Plan:  Pregnancy: [id number] at 5638w3d  1. Poor weight gain of pregnancy, second trimester --Pt with 14 lbs weight loss documented since initial prenatal visit. She is unable to eat, vomits frequently, has had poor success with medications.  See n/v below for medication management. [facility] and try in between meals for extra calories.  - feeding supplement, ENSURE COMPLETE, (ENSURE COMPLETE) LIQD; Take 237 mLs by mouth 2 (two) times daily between meals.  Dispense: 50 Bottle; Refill: 10  2. Nausea and vomiting during pregnancy --Pt taking Diclegis intermittently.  Discussed taking daily, at least 2 tabs at bedtime,  add 1 tab in morning and one more in afternoon. --Continue Reglan TID with meals. --Add Zofran for breakthrough nausea --Also Phenergan suppositories for when vomiting so much nothing staying down --Pt to follow up in 2 weeks in office to see if improved  - Doxylamine-Pyridoxine (DICLEGIS) 10-10 MG TBEC; Take 2 tabs at bedtime. If needed, add another tab in the morning. If needed, add another tab in the afternoon, up to 4 tabs/day.  Dispense: 100 tablet; Refill: 5 - metoCLOPramide (REGLAN) 10 MG tablet; Take 1 tablet (10 mg total) by mouth every 8 (eight) hours as needed for nausea.  Dispense: 60 tablet; Refill: 1 - ondansetron (ZOFRAN) 4 MG tablet; Take 1 tablet (4 mg total) by mouth every 8 (eight) hours as needed for nausea or vomiting.  Dispense: 20 tablet; Refill: 3 - promethazine (PHENERGAN) 25 MG suppository; Place 1 suppository (25 mg total) rectally every 6 (six) hours as needed for nausea or vomiting.  Dispense: 12 suppository; Refill: 5  3. Syncope, unspecified syncope type --Pt with several episodes of weakness and true syncope since early pregnancy.  She has missed work due to her n/v and to near syncopal episodes at her job. I authorize/approve her use of FMLA for missed work for her syncopal episodes. With today's treatment plan, I am recommending she return to work in 1 week, on 01/26/18.  --Her exam is wnl today, heart and lung sounds normal. EKG done 2 months ago wnl.  CBC, CMP drawn today. --Syncope is likely due to poor calorie intake/dehydration.  She has had 14 lbs documented weight loss during pregnancy. --Regimen changed to  manage pt nausea and increase PO intake. Ensure added daily between meals. --Pt to follow up in 2 weeks. If syncope or near syncope persists, consider cardiology referral.  Preterm labor symptoms and general obstetric precautions including but not limited to vaginal bleeding, contractions, leaking of fluid and fetal movement were reviewed in detail with the  patient. Please refer to After Visit Summary for other counseling recommendations.  Return in about 2 weeks (around 02/02/2018).  Future Appointments  Date Time Provider Department Center  02/02/2018  2:55 PM Kendell B[facility]  02/16/2018  8:20 AM [facility]  02/16/2018 10:15 AM Judeth Horn, NP [facility]    Sharen Counter, CNM

## 2018-01-19 NOTE — Patient Instructions (Addendum)
BENEFITS OF BREASTFEEDING Many women wonder if they should breastfeed. Research shows that breast milk contains the perfect balance of vitamins, protein and fat that your baby needs to grow. It also contains antibodies that help your baby's immune system to fight off viruses and bacteria and can reduce the risk of sudden infant death syndrome (SIDS). In addition, the colostrum (a fluid secreted from the breast in the first few days after delivery) helps your newborn's digestive system to grow and function well. Breast milk is easier to digest than formula. Also, if your baby is born preterm, breast milk can help to reduce both short- and long-term health problems. BENEFITS OF BREASTFEEDING FOR MOM . Breastfeeding causes a hormone to be released that helps the uterus to contract and return to its normal size more quickly. . It aids in postpartum weight loss, reduces risk of breast and ovarian cancer, heart disease and rheumatoid arthritis. . It decreases the amount of bleeding after the baby is born. benefits of breastfeeding for baby . Provides comfort and nutrition . Protects baby against - Obesity - Diabetes - Asthma - Childhood cancers - Heart disease - Ear infections - Diarrhea - Pneumonia - Stomach problems - Serious allergies - Skin rashes . Promotes growth and development . Reduces the risk of baby having Sudden Infant Death Syndrome (SIDS) only breastmilk for the first 6 months . Protects baby against diseases/allergies . It's the perfect amount for tiny bellies . It restores baby's energy . Provides the best nutrition for baby . Giving water or formula can make baby more likely to get sick, decrease Mom's milk supply, make baby less content with breastfeeding Skin to Skin After delivery, the staff will place your baby on your chest. This helps with the following: . Regulates baby's temperature, breathing, heart rate and blood sugar . Increases Mom's milk supply . Promotes  bonding . Keeps baby and Mom calm and decreases baby's crying Rooming In Your baby will stay in your room with you for the entire time you are in the hospital. This helps with the following: . Allows Mom to learn baby's feeding cues - Fluttering eyes - Sucking on tongue or hand - Rooting (opens mouth and turns head) - Nuzzling into the breast - Bringing hand to mouth . Allows breastfeeding on demand (when your baby is ready) . Helps baby to be calm and content . Ensures a good milk supply . Prevents complications with breastfeeding . Allows parents to learn to care for baby . Allows you to request assistance with breastfeeding Importance of a good latch . Increases milk transfer to baby - baby gets enough milk . Ensures you have enough milk for your baby . Decreases nipple soreness . Don't use pacifiers and bottles - these cause baby to suck differently than breastfeeding . Promotes continuation of breastfeeding Risks of Formula Supplementation with Breastfeeding Giving your infant formula in addition to your breast-milk EXCEPT when medically necessary can lead to: . Decreases your milk supply  . Loss of confidence in yourself for providing baby's nutrition  . Engorgement and possibly mastitis  . Asthma & allergies in the baby BREASTFEEDING FAQS How long should I breastfeed my baby? It is recommended that you provide your baby with breast milk only for the first 6 months and then continue for the first year and longer as desired. During the first few weeks after birth, your baby will need to feed 8-12 times every 24 hours, or every 2-3 hours. They will likely feed   for 15-30 minutes. How can I help my baby begin breastfeeding? Babies are born with an instinct to breastfeed. A healthy baby can begin breastfeeding right away without specific help. At the hospital, a nurse (or lactation consultant) will help you begin the process and will give you tips on good positioning. It may be  helpful to take a breastfeeding class before you deliver in order to know what to expect. How can I help my baby latch on? In order to assist your baby in latching-on, cup your breast in your hand and stroke your baby's lower lip with your nipple to stimulate your baby's rooting reflex. Your baby will look like he or she is yawning, at which point you should bring the baby towards your breast, while aiming the nipple at the roof of his or her mouth. Remember to bring the baby towards you and not your breast towards the baby. How can I tell if my baby is latched-on? Your baby will have all of your nipple and part of the dark area around the nipple in his or her mouth and your baby's nose will be touching your breast. You should see or hear the baby swallowing. If the baby is not latched-on properly, start the process over. To remove the suction, insert a clean finger between your breast and the baby's mouth. Should I switch breasts during feeding? After feeding on one side, switch the baby to your other breast. If he or she does not continue feeding - that is OK. Your baby will not necessarily need to feed from both breasts in a single feeding. On the next feeding, start with the other breast for efficiency and comfort. How can I tell if my baby is hungry? When your baby is hungry, they will nuzzle against your breast, make sucking noises and tongue motions and may put their hands near their mouth. Crying is a late sign of hunger, so you should not wait until this point. When they have received enough milk, they will unlatch from the breast. Is it okay to use a pacifier? Until your baby gets the hang of breastfeeding, experts recommend limiting pacifier usage. If you have questions about this, please contact your pediatrician. What can I do to ensure proper nutrition while breastfeeding? . Make sure that you support your own health and your baby's by eating a healthy, well-balanced diet . Your provider  may recommend that you continue to take your prenatal vitamin . Drink plenty of fluids. It is a good rule to drink one glass of water before or after feeding . Alcohol will remain in the breast milk for as long as it will remain in the blood stream. If you choose to have a drink, it is recommended that you wait at least 2 hours before feeding . Moderate amounts of caffeine are OK . Some over-the-counter or prescription medications are not recommended during breastfeeding. Check with your provider if you have questions What types of birth control methods are safe while breastfeeding? Progestin-only methods, including a daily pill, an IUD, the implant and the injection are safe while breastfeeding. Methods that contain estrogen (such as combination birth control pills, the vaginal ring and the patch) should not be used during the first month of breastfeeding as these can decrease your milk supply.   Syncope Syncope is when you temporarily lose consciousness. Syncope may also be called fainting or passing out. It is caused by a sudden decrease in blood flow to the brain. Even though most  causes of syncope are not dangerous, syncope can be a sign of a serious medical problem. Signs that you may be about to faint include:  Feeling dizzy or light-headed.  Feeling nauseous.  Seeing all white or all black in your field of vision.  Having cold, clammy skin.  If you fainted, get medical help right away.Call your local emergency services (911 in the [place] Do not drive yourself to the hospital. Follow these instructions at home: Pay attention to any changes in your symptoms. Take these actions to help with your condition:  Have someone stay with you until you feel stable.  Do not drive, use machinery, or play sports until your health care provider says it is okay.  Keep all follow-up visits as told by your health care provider. This is important.  If you start to feel like you might faint, lie  down right away and raise (elevate) your feet above the level of your heart. Breathe deeply and steadily. Wait until all of the symptoms have passed.  Drink enough fluid to keep your urine clear or pale yellow.  If you are taking blood pressure or heart medicine, get up slowly and take several minutes to sit and then stand. This can reduce dizziness.  Take over-the-counter and prescription medicines only as told by your health care provider.  Get help right away if:  You have a severe headache.  You have unusual pain in your chest, abdomen, or back.  You are bleeding from your mouth or rectum, or you have black or tarry stool.  You have a very fast or irregular heartbeat (palpitations).  You have pain with breathing.  You faint once or repeatedly.  You have a seizure.  You are confused.  You have trouble walking.  You have severe weakness.  You have vision problems. These symptoms may represent a serious problem that is an emergency. Do not wait to see if your symptoms will go away. Get medical help right away. Call your local emergency services (911 in the [place] Do not drive yourself to the hospital. This information is not intended to replace advice given to you by your health care provider. Make sure you discuss any questions you have with your health care provider. Document Released: 07/14/2005 Document Revised: 12/20/2015 Document Reviewed: 03/28/2015 Elsevier Interactive Patient Education  [employer]2018 [employer]

## 2018-01-20 ENCOUNTER — Encounter: Payer: Self-pay | Admitting: Family Medicine

## 2018-02-02 ENCOUNTER — Ambulatory Visit (INDEPENDENT_AMBULATORY_CARE_PROVIDER_SITE_OTHER): Payer: Medicaid Other | Admitting: Advanced Practice Midwife

## 2018-02-02 VITALS — BP 123/73 | HR 80 | Wt 215.6 lb

## 2018-02-02 DIAGNOSIS — O2612 Low weight gain in pregnancy, second trimester: Secondary | ICD-10-CM

## 2018-02-02 DIAGNOSIS — O34219 Maternal care for unspecified type scar from previous cesarean delivery: Secondary | ICD-10-CM

## 2018-02-02 DIAGNOSIS — R55 Syncope and collapse: Secondary | ICD-10-CM

## 2018-02-02 DIAGNOSIS — O368120 Decreased fetal movements, second trimester, not applicable or unspecified: Secondary | ICD-10-CM

## 2018-02-02 DIAGNOSIS — Z3480 Encounter for supervision of other normal pregnancy, unspecified trimester: Secondary | ICD-10-CM

## 2018-02-02 NOTE — Patient Instructions (Signed)
Syncope Syncope is when you temporarily lose consciousness. Syncope may also be called fainting or passing out. It is caused by a sudden decrease in blood flow to the brain. Even though most causes of syncope are not dangerous, syncope can be a sign of a serious medical problem. Signs that you may be about to faint include:  Feeling dizzy or light-headed.  Feeling nauseous.  Seeing all white or all black in your field of vision.  Having cold, clammy skin.  If you fainted, get medical help right away.Call your local emergency services (911 in the [place] Do not drive yourself to the hospital. Follow these instructions at home: Pay attention to any changes in your symptoms. Take these actions to help with your condition:  Have someone stay with you until you feel stable.  Do not drive, use machinery, or play sports until your health care provider says it is okay.  Keep all follow-up visits as told by your health care provider. This is important.  If you start to feel like you might faint, lie down right away and raise (elevate) your feet above the level of your heart. Breathe deeply and steadily. Wait until all of the symptoms have passed.  Drink enough fluid to keep your urine clear or pale yellow.  If you are taking blood pressure or heart medicine, get up slowly and take several minutes to sit and then stand. This can reduce dizziness.  Take over-the-counter and prescription medicines only as told by your health care provider.  Get help right away if:  You have a severe headache.  You have unusual pain in your chest, abdomen, or back.  You are bleeding from your mouth or rectum, or you have black or tarry stool.  You have a very fast or irregular heartbeat (palpitations).  You have pain with breathing.  You faint once or repeatedly.  You have a seizure.  You are confused.  You have trouble walking.  You have severe weakness.  You have vision problems. These  symptoms may represent a serious problem that is an emergency. Do not wait to see if your symptoms will go away. Get medical help right away. Call your local emergency services (911 in the [place] Do not drive yourself to the hospital. This information is not intended to replace advice given to you by your health care provider. Make sure you discuss any questions you have with your health care provider. Document Released: 07/14/2005 Document Revised: 12/20/2015 Document Reviewed: 03/28/2015 Elsevier Interactive Patient Education  2018 [employer] Inc.  

## 2018-02-02 NOTE — Progress Notes (Signed)
   PRENATAL VISIT NOTE  Subjective:  Julia Armstrong is a 27 y.o. [id number] at 3611w2d being seen today for ongoing prenatal care.  She is currently monitored for the following issues for this low-risk pregnancy and has Supervision of other normal pregnancy, antepartum; Previous cesarean delivery, antepartum; History of depression; and Obesity (BMI 35.0-39.9 without comorbidity) on their problem list.  Patient reports continuing episodes of syncope, not feeling as much baby movement as normal today.  Contractions: Not present. Vag. Bleeding: None.  Movement: (!) Decreased. Denies leaking of fluid.   The following portions of the patient's history were reviewed and updated as appropriate: allergies, current medications, past family history, past medical history, past social history, past surgical history and problem list. Problem list updated.  Objective:   Vitals:   02/02/18 1521  BP: 123/73  Pulse: 80  Weight: 215 lb 9.6 oz (97.8 kg)    Fetal Status: Fetal Heart Rate (bpm): 156   Movement: (!) Decreased     General:  Alert, oriented and cooperative. Patient is in no acute distress.  Skin: Skin is warm and dry. No rash noted.   Cardiovascular: Normal heart rate noted  Respiratory: Normal respiratory effort, no problems with respiration noted  Abdomen: Soft, gravid, appropriate for gestational age.  Pain/Pressure: Present     Pelvic: Cervical exam deferred        Extremities: Normal range of motion.  Edema: Trace  Mental Status: Normal mood and affect. Normal behavior. Normal judgment and thought content.   Assessment and Plan:  Pregnancy: [id number] at 511w2d  1. Syncope, unspecified syncope type --Pt with syncopal episodes and falls to the ground 3-4 times/week.  At last prenatal visit on 6/25, nausea medications changed [facility] Pt is doing better with nausea and is drinking Ensure.  Has gained 1 lb this week.  But syncopal episodes worsening with some loss of memory  surrounding episodes. Called EMS on 01/29/18 but declined to come in to hospital at that time. - Ambulatory referral to Cardiology --Pt to MAU now for evaluation  --Pt unable to work, job will not let her return with syncopal episodes.  Approve with FMLA to be out of work due to medical condition.    2. Poor weight gain of pregnancy, second trimester   3. Supervision of other normal pregnancy, antepartum   4. Previous cesarean delivery, antepartum --C/S x 3, plans repeat  5. Decreased fetal movement --FHT wnl today but to MAU to evaluate for syncopal episodes, needs NST  Preterm labor symptoms and general obstetric precautions including but not limited to vaginal bleeding, contractions, leaking of fluid and fetal movement were reviewed in detail with the patient. Please refer to After Visit Summary for other counseling recommendations.  No follow-ups on file.  Future Appointments  Date Time Provider Department Center  02/16/2018  8:20 AM [facility]  02/16/2018 10:15 AM Judeth HornLawrence, Erin, NP [facility]    Sharen CounterLisa Leftwich-Kirby, CNM

## 2018-02-02 NOTE — Progress Notes (Signed)
[facility]  11:45am 02/03/2018  [address]. Patient Notified.

## 2018-02-02 NOTE — Progress Notes (Signed)
Syncope Episodes

## 2018-02-03 ENCOUNTER — Encounter: Payer: Self-pay | Admitting: Physician Assistant

## 2018-02-03 ENCOUNTER — Inpatient Hospital Stay (HOSPITAL_COMMUNITY)
Admission: AD | Admit: 2018-02-03 | Discharge: 2018-02-03 | Disposition: A | Discharge status: Home / Self Care | Payer: Medicaid Other | Source: Ambulatory Visit | Attending: Obstetrics and Gynecology | Admitting: Obstetrics and Gynecology

## 2018-02-03 ENCOUNTER — Ambulatory Visit: Payer: Medicaid Other | Admitting: Cardiovascular Disease

## 2018-02-03 ENCOUNTER — Ambulatory Visit (INDEPENDENT_AMBULATORY_CARE_PROVIDER_SITE_OTHER): Payer: Medicaid Other | Admitting: Physician Assistant

## 2018-02-03 ENCOUNTER — Encounter (HOSPITAL_COMMUNITY): Payer: Self-pay

## 2018-02-03 VITALS — BP 118/80 | HR 85 | Ht 67.0 in | Wt 216.8 lb

## 2018-02-03 DIAGNOSIS — R55 Syncope and collapse: Secondary | ICD-10-CM | POA: Diagnosis not present

## 2018-02-03 DIAGNOSIS — Z79899 Other long term (current) drug therapy: Secondary | ICD-10-CM | POA: Diagnosis not present

## 2018-02-03 DIAGNOSIS — O9989 Other specified diseases and conditions complicating pregnancy, childbirth and the puerperium: Secondary | ICD-10-CM | POA: Diagnosis not present

## 2018-02-03 DIAGNOSIS — Z3A25 25 weeks gestation of pregnancy: Secondary | ICD-10-CM

## 2018-02-03 DIAGNOSIS — R42 Dizziness and giddiness: Secondary | ICD-10-CM | POA: Diagnosis present

## 2018-02-03 DIAGNOSIS — O26892 Other specified pregnancy related conditions, second trimester: Secondary | ICD-10-CM | POA: Insufficient documentation

## 2018-02-03 LAB — CBC
HCT: 35.7 % — ABNORMAL LOW (ref 36.0–46.0)
Hemoglobin: 12 g/dL (ref 12.0–15.0)
MCH: 26.7 pg (ref 26.0–34.0)
MCHC: 33.6 g/dL (ref 30.0–36.0)
MCV: 79.3 fL (ref 78.0–100.0)
Platelets: 212 10*3/uL (ref 150–400)
RBC: 4.5 MIL/uL (ref 3.87–5.11)
RDW: 14.6 % (ref 11.5–15.5)
WBC: 9.8 10*3/uL (ref 4.0–10.5)

## 2018-02-03 LAB — URINALYSIS, ROUTINE W REFLEX MICROSCOPIC
Bilirubin Urine: NEGATIVE
Glucose, UA: NEGATIVE mg/dL
Hgb urine dipstick: NEGATIVE
Ketones, ur: NEGATIVE mg/dL
Leukocytes, UA: NEGATIVE
Nitrite: NEGATIVE
Protein, ur: NEGATIVE mg/dL
Specific Gravity, Urine: 1.024 (ref 1.005–1.030)
pH: 5 (ref 5.0–8.0)

## 2018-02-03 LAB — COMPREHENSIVE METABOLIC PANEL
ALT: 11 U/L (ref 0–44)
AST: 17 U/L (ref 15–41)
Albumin: 2.6 g/dL — ABNORMAL LOW (ref 3.5–5.0)
Alkaline Phosphatase: 56 U/L (ref 38–126)
Anion gap: 8 (ref 5–15)
BUN: 8 mg/dL (ref 6–20)
CO2: 22 mmol/L (ref 22–32)
Calcium: 8.8 mg/dL — ABNORMAL LOW (ref 8.9–10.3)
Chloride: 104 mmol/L (ref 98–111)
Creatinine, Ser: 0.67 mg/dL (ref 0.44–1.00)
GFR calc Af Amer: >60 mL/min (ref >60)
GFR calc non Af Amer: >60 mL/min (ref >60)
Glucose, Bld: 89 mg/dL (ref 70–99)
Potassium: 3.5 mmol/L (ref 3.5–5.1)
Sodium: 134 mmol/L — ABNORMAL LOW (ref 135–145)
Total Bilirubin: 0.3 mg/dL (ref 0.3–1.2)
Total Protein: 6.4 g/dL — ABNORMAL LOW (ref 6.5–8.1)

## 2018-02-03 NOTE — MAU Note (Signed)
Pt states she was suppose to come here yesterday because she has episodes of "passing out" but could not come in until tonight. States she passed out last night. States boyfriend witnessed the episode and that she was sitting in a chair and body went limp. States she saw a cardiologist today and was told heart rate was a little irregular. States that he was going follow up with patient and possible monitoring. States she has not passed out today. States she just felt lightheaded. State she has only eaten anything since this morning. Pt reports not feeling baby move since last week and had an appt yesterday with OB and everything was normal. Pt denies pain or vaginal bleeding.

## 2018-02-03 NOTE — Progress Notes (Signed)
Cardiology Office Note    Date:  02/05/2018   ID:  Julia Armstrong, DOB September 15, 1990, MRN [id number]  PCP:  [facility]  Cardiologist:  New -Case discussed with Dr. Peter Armstrong  Chief Complaint  Patient presents with  . New Patient (Initial Visit)    syncope, pt pass out last night. Referred by OB/GYN Julia Armstrong    History of Present Illness:  Julia Armstrong is a 27 y.o. female with 2 abortion and 3 living child current at her six pregnancy.  Her previous 3 deliveries all C-section.  According to the patient, she never had any complications during her previous pregnancies.  She has never had passing out spells before with previous pregnancy.  She denies any family history of seizure nor does she have any childhood seizure.  She used to work at [employer], however unable to do so after she had a passing out spells 3 months ago.  She says the room she was in was very hot at the time and she spontaneously fell to the ground after passing out.  Since then, she has had multiple passing out spells.  At this point, she passes out about 3-4 times a week.  I highly recommend her to seek urgent medical attention at the local ED the next time she passed out.  Her OB visit also demonstrated she is not picking up weight as she should.  She has been placed on Ensure which she uses everyday.  She did have severe morning sickness with nausea and vomiting during the first trimester, however her symptom is improving during the second trimester.  She is trying to eat more.  She is also keeping herself hydrated as well.  According to her partner, during one of her passing out spell last week, she was biting her tongue and was shaking at the time.  However that was the only time she exhibited the symptom.  She was laying down next to her partner last night using her cell phone when she passed out again.  Her cell phone dropped to the ground and that caught the attention of her partner.   Her partner try to wake her up for the next few minutes.  She did have prodromal symptoms of dizziness prior to the event.  So far it appears her baby is doing fine despite the recent recurrent seizure episodes.  I discussed the case with DOD Dr. [place], eventually we recommended obtaining echocardiogram and 2-week event monitor.  Even though she had at that one episode where she was biting her tongue and shaking at the same time, however there is no recurrence of the symptom to consistently suggest seizure.  I will hold off on neuro referral at this time.  However if her symptoms occur again, I would really recommend her to go to the ED and potentially have EEG done in the hospital.    Past Medical History:  Diagnosis Date  . Gallstones     Past Surgical History:  Procedure Laterality Date  . CESAREAN SECTION     C/S x 3  . DILATION AND EVACUATION N/A 02/18/2017   Procedure: DILATATION AND EVACUATION;  Surgeon: Tereso Newcomer, MD;  Location: [facility];  Service: Gynecology;  Laterality: N/A;  . WISDOM TOOTH EXTRACTION      Current Medications: Outpatient Medications Prior to Visit  Medication Sig Dispense Refill  . acetaminophen (TYLENOL) 325 MG tablet Take 650 mg by mouth every 6 (six) hours as needed for headache.    [name]  butalbital-acetaminophen-caffeine (FIORICET, ESGIC) 50-325-40 MG tablet     . Doxylamine-Pyridoxine (DICLEGIS) 10-10 MG TBEC Take 2 tabs at bedtime. If needed, add another tab in the morning. If needed, add another tab in the afternoon, up to 4 tabs/day. 100 tablet 5  . feeding supplement, ENSURE COMPLETE, (ENSURE COMPLETE) LIQD Take 237 mLs by mouth 2 (two) times daily between meals. 50 Bottle 10  . metoCLOPramide (REGLAN) 10 MG tablet Take 1 tablet (10 mg total) by mouth every 8 (eight) hours as needed for nausea. 60 tablet 1  . ondansetron (ZOFRAN) 4 MG tablet Take 1 tablet (4 mg total) by mouth every 8 (eight) hours as needed for nausea or vomiting. 20 tablet 3  .  Prenatal Vit-Fe Fumarate-FA (PREPLUS) 27-1 MG TABS Take 1 tablet by mouth daily. 30 tablet 13  . promethazine (PHENERGAN) 25 MG suppository Place 1 suppository (25 mg total) rectally every 6 (six) hours as needed for nausea or vomiting. 12 suppository 5   No facility-administered medications prior to visit.      Allergies:   Patient has no known allergies.   Social History   Socioeconomic History  . Marital status: Single    Spouse name: Not on file  . Number of children: Not on file  . Years of education: Not on file  . Highest education level: Not on file  Occupational History  . Not on file  Social Needs  . Financial resource strain: Not on file  . Food insecurity:    Worry: Not on file    Inability: Not on file  . Transportation needs:    Medical: Not on file    Non-medical: Not on file  Tobacco Use  . Smoking status: Never Smoker  . Smokeless tobacco: Never Used  Substance and Sexual Activity  . Alcohol use: No  . Drug use: No  . Sexual activity: Yes    Birth control/protection: None  Lifestyle  . Physical activity:    Days per week: Not on file    Minutes per session: Not on file  . Stress: Not on file  Relationships  . Social connections:    Talks on phone: Not on file    Gets together: Not on file    Attends religious service: Not on file    Active member of club or organization: Not on file    Attends meetings of clubs or organizations: Not on file    Relationship status: Not on file  Other Topics Concern  . Not on file  Social History Narrative  . Not on file     Family History:  The patient's family history includes Cancer in her maternal grandmother and other.   ROS:   Please see the history of present illness.    ROS All other systems reviewed and are negative.   PHYSICAL EXAM:   VS:  BP 118/80   Pulse 85   Ht 5\' 7"  (1.702 m)   Wt 216 lb 12.8 oz (98.3 kg)   LMP 08/05/2017 (LMP Unknown)   BMI 33.96 kg/m    GEN: Well nourished, well  developed, in no acute distress  HEENT: normal  Neck: no JVD, carotid bruits, or masses Cardiac: RRR; no murmurs, rubs, or gallops,no edema  Respiratory:  clear to auscultation bilaterally, normal work of breathing GI: soft, nontender, nondistended, + BS MS: no deformity or atrophy  Skin: warm and dry, no rash Neuro:  Alert and Oriented x 3, Strength and sensation are intact Psych: euthymic mood,  full affect  Wt Readings from Last 3 Encounters:  02/03/18 218 lb (98.9 kg)  02/03/18 216 lb 12.8 oz (98.3 kg)  02/02/18 215 lb 9.6 oz (97.8 kg)      Studies/Labs Reviewed:   EKG:  EKG is ordered today.  The ekg ordered today demonstrates NSR with TWI in V1-V2,   Recent Labs: 02/03/2018: ALT 11; BUN 8; Creatinine, Ser 0.67; Hemoglobin 12.0; Platelets 212; Potassium 3.5; Sodium 134; TSH 1.230   Lipid Panel No results found for: CHOL, TRIG, HDL, CHOLHDL, VLDL, LDLCALC, LDLDIRECT  Additional studies/ records that were reviewed today include:   N/A  ASSESSMENT:    1. Syncope, unspecified syncope type   2. [redacted] weeks gestation of pregnancy      PLAN:  In order of problems listed above:  1. Syncope: Unclear etiology at this time, could be potentially related to malnutrition versus arrhythmia versus neurogenic assuming no presence of factitious disorder.  During one of her syncopal event last week, she was biting her tongue and shaking, that was the only episode where she exhibited the symptoms.  Most of the time where she passed out, she is simply not responding.  Her growing baby seems to be quite stable despite multiple syncope.  I discussed the case with DOD Dr. Peter Armstrong, I recommend echocardiogram to rule out structural heart disease and a 2-week event monitor.  If those tests are normal, she may benefit from a EEG by neurology.  She will also need secondary lab work include complete metabolic panel and a TSH.  2. [redacted] weeks gestation: This is her 6 pregnancy, all previous pregnancy  seems to be normal.  During the current pregnancy, she had a severe morning sickness and had trouble gaining weight.    Medication Adjustments/Labs and Tests Ordered: Current medicines are reviewed at length with the patient today.  Concerns regarding medicines are outlined above.  Medication changes, Labs and Tests ordered today are listed in the Patient Instructions below. Patient Instructions  Medication Instructions:  Your physician recommends that you continue on your current medications as directed. Please refer to the Current Medication list given to you today.  Labwork: Your physician recommends that you return for lab work in: TODAY-CMET, TSH  Testing/Procedures: Your physician has recommended that you wear an 2 weeks event monitor. Event monitors are medical devices that record the heart's electrical activity. Doctors most often [place] these monitors to diagnose arrhythmias. Arrhythmias are problems with the speed or rhythm of the heartbeat. The monitor is a small, portable device. You can wear one while you do your normal daily activities. This is usually used to diagnose what is causing palpitations/syncope (passing out).  Your physician has requested that you have an echocardiogram. Echocardiography is a painless test that uses sound waves to create images of your heart. It provides your doctor with information about the size and shape of your heart and how well your heart's chambers and valves are working. This procedure takes approximately one hour. There are no restrictions for this procedure. [address]  Follow-Up: Your physician recommends that you schedule a follow-up appointment in: 6 WEEKS with Temitope Flammer OR DR Armstrong.  Any Other Special Instructions Will Be Listed Below (If Applicable). If you need a refill on your cardiac medications before your next appointment, please call your pharmacy.     Ramond Dial, [place]  02/05/2018 11:43 PM    [facility] [address] [place], [place], [place]  [address] Phone: [phone]; Fax: [phone]

## 2018-02-03 NOTE — Patient Instructions (Signed)
Medication Instructions:  Your physician recommends that you continue on your current medications as directed. Please refer to the Current Medication list given to you today.  Labwork: Your physician recommends that you return for lab work in: TODAY-CMET, TSH  Testing/Procedures: Your physician has recommended that you wear an 2 weeks event monitor. Event monitors are medical devices that record the heart's electrical activity. Doctors most often us these monitors to diagnose arrhythmias. Arrhythmias are problems with the speed or rhythm of the heartbeat. The monitor is a small, portable device. You can wear one while you do your normal daily activities. This is usually used to diagnose what is causing palpitations/syncope (passing out).  Your physician has requested that you have an echocardiogram. Echocardiography is a painless test that uses sound waves to create images of your heart. It provides your doctor with information about the size and shape of your heart and how well your heart's chambers and valves are working. This procedure takes approximately one hour. There are no restrictions for this procedure. [address]  Follow-Up: Your physician recommends that you schedule a follow-up appointment in: 6 WEEKS with HAO OR DR SwazilandJORDAN.  Any Other Special Instructions Will Be Listed Below (If Applicable). If you need a refill on your cardiac medications before your next appointment, please call your pharmacy.

## 2018-02-03 NOTE — Discharge Instructions (Signed)
Near-Syncope °Near-syncope is when you suddenly get weak or dizzy, or you feel like you might pass out (faint). During an episode of near-syncope, you may: °· Feel dizzy or light-headed. °· Feel sick to your stomach (nauseous). °· See all white or all black. °· Have cold, clammy skin. ° °If you passed out, get help right away.Call your local emergency services (911 in the [place] Do not drive yourself to the hospital. °Follow these instructions at home: °Pay attention to any changes in your symptoms. Take these actions to help with your condition: °· Have someone stay with you until you feel stable. °· Do not drive, use machinery, or play sports until your doctor says it is okay. °· Keep all follow-up visits as told by your doctor. This is important. °· If you start to feel like you might pass out, lie down right away and raise (elevate) your feet above the level of your heart. Breathe deeply and steadily. Wait until all of the symptoms are gone. °· Drink enough fluid to keep your pee (urine) clear or pale yellow. °· If you are taking blood pressure or heart medicine, get up slowly and spend many minutes getting ready to sit and then stand. This can help with dizziness. °· Take over-the-counter and prescription medicines only as told by your doctor. ° °Get help right away if: °· You have a very bad headache. °· You have unusual pain in your chest, tummy, or back. °· You are bleeding from your mouth or rectum. °· You have black or tarry poop (stool). °· You have a very fast or uneven heartbeat (palpitations). °· You pass out one time or more than once. °· You have jerky movements that you cannot control (seizure). °· You are confused. °· You have trouble walking. °· You are very weak. °· You have vision problems. °These symptoms may be an emergency. Do not wait to see if the symptoms will go away. Get medical help right away. Call your local emergency services (911 in the [place] Do not drive yourself to the  hospital. °This information is not intended to replace advice given to you by your health care provider. Make sure you discuss any questions you have with your health care provider. °Document Released: 12/31/2007 Document Revised: 12/20/2015 Document Reviewed: 03/28/2015 °Elsevier Interactive Patient Education © 2017 Else[employer] Inc. ° °

## 2018-02-03 NOTE — MAU Provider Note (Signed)
Chief Complaint:  Dizziness   First Provider Initiated Contact with Patient 02/03/18 2054     HPI: Julia Armstrong is a 27 y.o. [id number] at 54w3dwho presents to maternity admissions reporting lightheaded, dizzy and passing out at work and home. She reports these symptoms have been occurring for the past 3-4 weeks. She has been seen in the office, MAU and saw Cardiology today for symptoms. She reports she being at home last night and was cleaning, started to feel lightheaded and dizzy. Sat down and boyfriends reported she passed out. She denies being referred to neuro or having a hx of seizures. She reports cardiology states her HR was slightly irregular and the possibility of monitoring. She reports only eating this morning but she regularly eat 3-4 times a day and meals with high protein per patient.  She reports flutters but reports she has difficulty feeling baby move- external fetal movement palpated. She denies LOF, vaginal bleeding, vaginal itching/burning, urinary symptoms, or fever/chills.  Boyfriend of patient believes passing out spells are actually seizures.  Boyfriend reports patient eyes are open during passing out and that she is not responsive to commands.  Past Medical History: Past Medical History:  Diagnosis Date  . Gallstones     Past obstetric history: OB History  Gravida Para Term Preterm AB Living  6 3 3   2 3   SAB TAB Ectopic Multiple Live Births  2       3    # Outcome Date GA Lbr Len/2nd Weight Sex Delivery Anes PTL Lv  6 Current           5 SAB 01/25/17          4 Term 02/21/15    M CS-LTranv   LIV  3 Term 06/27/13    M CS-LTranv   LIV  2 Term 07/09/12    M CS-LTranv   LIV  1 SAB             Past Surgical History: Past Surgical History:  Procedure Laterality Date  . CESAREAN SECTION     C/S x 3  . DILATION AND EVACUATION N/A 02/18/2017   Procedure: DILATATION AND EVACUATION;  Surgeon: Tereso Newcomer, MD;  Location: [facility];  Service: Gynecology;   Laterality: N/A;  . WISDOM TOOTH EXTRACTION      Family History: Family History  Problem Relation Age of Onset  . Cancer Other   . Cancer Maternal Grandmother        breast cancer    Social History: Social History   Tobacco Use  . Smoking status: Never Smoker  . Smokeless tobacco: Never Used  Substance Use Topics  . Alcohol use: No  . Drug use: No    Allergies: No Known Allergies  Meds:  Medications Prior to Admission  Medication Sig Dispense Refill Last Dose  . acetaminophen (TYLENOL) 325 MG tablet Take 650 mg by mouth every 6 (six) hours as needed for headache.   Taking  . butalbital-acetaminophen-caffeine (FIORICET, ESGIC) 50-325-40 MG tablet    Taking  . Doxylamine-Pyridoxine (DICLEGIS) 10-10 MG TBEC Take 2 tabs at bedtime. If needed, add another tab in the morning. If needed, add another tab in the afternoon, up to 4 tabs/day. 100 tablet 5 Taking  . feeding supplement, ENSURE COMPLETE, (ENSURE COMPLETE) LIQD Take 237 mLs by mouth 2 (two) times daily between meals. 50 Bottle 10 Taking  . metoCLOPramide (REGLAN) 10 MG tablet Take 1 tablet (10 mg total) by mouth every 8 (eight)  hours as needed for nausea. 60 tablet 1 Taking  . ondansetron (ZOFRAN) 4 MG tablet Take 1 tablet (4 mg total) by mouth every 8 (eight) hours as needed for nausea or vomiting. 20 tablet 3 Taking  . Prenatal Vit-Fe Fumarate-FA (PREPLUS) 27-1 MG TABS Take 1 tablet by mouth daily. 30 tablet 13 Taking  . promethazine (PHENERGAN) 25 MG suppository Place 1 suppository (25 mg total) rectally every 6 (six) hours as needed for nausea or vomiting. 12 suppository 5 Taking    ROS:  Review of Systems  Respiratory: Negative.   Cardiovascular: Negative.   Gastrointestinal: Negative.   Genitourinary: Negative.   Musculoskeletal: Negative.   Neurological: Positive for dizziness, syncope and light-headedness.   I have reviewed patient's Past Medical Hx, Surgical Hx, Family Hx, Social Hx, medications and  allergies.   Physical Exam   Patient Vitals for the past 24 hrs:  BP Temp Temp src Pulse Resp SpO2 Height Weight  02/03/18 2220 126/72 - - - - - - -  02/03/18 1941 121/68 98.4 F (36.9 C) Oral 89 18 98 % 5\' 7"  (1.702 m) 218 lb (98.9 kg)   Constitutional: Well-developed, well-nourished female in no acute distress.  Cardiovascular: normal rate Respiratory: normal effort GI: Abd soft, non-tender, gravid appropriate for gestational age. Movement externally palpated.  MS: Extremities nontender, no edema, normal ROM Neurologic: Alert and oriented x 4.  PELVIC EXAM: deferred   FHT:  Baseline 140 , moderate variability, accelerations present (10x10), no decelerations Contractions: none   Labs: Results for orders placed or performed during the hospital encounter of 02/03/18 (from the past 24 hour(s))  Urinalysis, Routine w reflex microscopic     Status: None   Collection Time: 02/03/18  7:28 PM  Result Value Ref Range   Color, Urine YELLOW YELLOW   APPearance CLEAR CLEAR   Specific Gravity, Urine 1.024 1.005 - 1.030   pH 5.0 5.0 - 8.0   Glucose, UA NEGATIVE NEGATIVE mg/dL   Hgb urine dipstick NEGATIVE NEGATIVE   Bilirubin Urine NEGATIVE NEGATIVE   Ketones, ur NEGATIVE NEGATIVE mg/dL   Protein, ur NEGATIVE NEGATIVE mg/dL   Nitrite NEGATIVE NEGATIVE   Leukocytes, UA NEGATIVE NEGATIVE  CBC     Status: Abnormal   Collection Time: 02/03/18  9:12 PM  Result Value Ref Range   WBC 9.8 4.0 - 10.5 K/uL   RBC 4.50 3.87 - 5.11 MIL/uL   Hemoglobin 12.0 12.0 - 15.0 g/dL   HCT 16.1 (L) 09.6 - 04.5 %   MCV 79.3 78.0 - 100.0 fL   MCH 26.7 26.0 - 34.0 pg   MCHC 33.6 30.0 - 36.0 g/dL   RDW 40.9 81.1 - 91.4 %   Platelets 212 150 - 400 K/uL  Comprehensive metabolic panel     Status: Abnormal   Collection Time: 02/03/18  9:12 PM  Result Value Ref Range   Sodium 134 (L) 135 - 145 mmol/L   Potassium 3.5 3.5 - 5.1 mmol/L   Chloride 104 98 - 111 mmol/L   CO2 22 22 - 32 mmol/L   Glucose, Bld 89  70 - 99 mg/dL   BUN 8 6 - 20 mg/dL   Creatinine, Ser 7.82 0.44 - 1.00 mg/dL   Calcium 8.8 (L) 8.9 - 10.3 mg/dL   Total Protein 6.4 (L) 6.5 - 8.1 g/dL   Albumin 2.6 (L) 3.5 - 5.0 g/dL   AST 17 15 - 41 U/L   ALT 11 0 - 44 U/L   Alkaline Phosphatase  56 38 - 126 U/L   Total Bilirubin 0.3 0.3 - 1.2 mg/dL   GFR calc non Af Amer >60 >60 mL/min   GFR calc Af Amer >60 >60 mL/min   Anion gap 8 5 - 15   B/Positive/-- (04/09 1141)  MAU Course/MDM: Orders Placed This Encounter  Procedures  . Urinalysis, Routine w reflex microscopic  . CBC  . Comprehensive metabolic panel   Urinalysis negative CBC and CMP within normal limits NST reviewed-reactive  Educated and discussed importance of increasing the amount of protein patient eats in her diet, discussed importance of increasing number of meals and snacks patient to eat and not to only eat one time a day or have long breaks in between eating.  Patient verbalizes understanding  Discussed with patient need to continue follow-up with cardiology.  Discussed with patient will refer to neurology due to reports from patient boyfriend who is present at each occurrence of symptoms.   Pt discharge.  Patient is stable at time of discharge.  Assessment: 1. Syncope, unspecified syncope type   2. [redacted] weeks gestation of pregnancy     Plan: Discharge home Preterm labor precautions Follow-up with cardiology Referral to neurology Follow with prenatal appointments as scheduled Return to MAU as needed    Allergies as of 02/03/2018   No Known Allergies     Medication List    TAKE these medications   acetaminophen 325 MG tablet Commonly known as:  TYLENOL Take 650 mg by mouth every 6 (six) hours as needed for headache.   butalbital-acetaminophen-caffeine 50-325-40 MG tablet Commonly known as:  FIORICET, ESGIC   Doxylamine-Pyridoxine 10-10 MG Tbec Commonly known as:  DICLEGIS Take 2 tabs at bedtime. If needed, add another tab in the morning.  If needed, add another tab in the afternoon, up to 4 tabs/day.   feeding supplement (ENSURE COMPLETE) Liqd Take 237 mLs by mouth 2 (two) times daily between meals.   metoCLOPramide 10 MG tablet Commonly known as:  REGLAN Take 1 tablet (10 mg total) by mouth every 8 (eight) hours as needed for nausea.   ondansetron 4 MG tablet Commonly known as:  ZOFRAN Take 1 tablet (4 mg total) by mouth every 8 (eight) hours as needed for nausea or vomiting.   PREPLUS 27-1 MG Tabs Take 1 tablet by mouth daily.   promethazine 25 MG suppository Commonly known as:  PHENERGAN Place 1 suppository (25 mg total) rectally every 6 (six) hours as needed for nausea or vomiting.       Steward DroneVeronica Ranie Chinchilla Certified Nurse-Midwife 02/04/2018 5:43 AM

## 2018-02-04 LAB — COMPREHENSIVE METABOLIC PANEL
ALK PHOS: 67 IU/L (ref 39–117)
ALT: 9 IU/L (ref 0–32)
AST: 17 IU/L (ref 0–40)
Albumin/Globulin Ratio: 1.1 — ABNORMAL LOW (ref 1.2–2.2)
Albumin: 3.5 g/dL (ref 3.5–5.5)
BUN/Creatinine Ratio: 12 (ref 9–23)
BUN: 6 mg/dL (ref 6–20)
Bilirubin Total: <0.2 mg/dL (ref 0.0–1.2)
CALCIUM: 9.1 mg/dL (ref 8.7–10.2)
CO2: 21 mmol/L (ref 20–29)
CREATININE: 0.5 mg/dL — AB (ref 0.57–1.00)
Chloride: 102 mmol/L (ref 96–106)
GFR calc Af Amer: 156 mL/min/{1.73_m2} (ref >59)
GFR, EST NON AFRICAN AMERICAN: 135 mL/min/{1.73_m2} (ref >59)
GLOBULIN, TOTAL: 3.1 g/dL (ref 1.5–4.5)
Glucose: 78 mg/dL (ref 65–99)
Potassium: 4.3 mmol/L (ref 3.5–5.2)
SODIUM: 136 mmol/L (ref 134–144)
Total Protein: 6.6 g/dL (ref 6.0–8.5)

## 2018-02-04 LAB — TSH: TSH: 1.23 u[IU]/mL (ref 0.450–4.500)

## 2018-02-05 ENCOUNTER — Other Ambulatory Visit: Payer: Self-pay | Admitting: General Practice

## 2018-02-05 ENCOUNTER — Encounter: Payer: Self-pay | Admitting: General Practice

## 2018-02-05 ENCOUNTER — Encounter: Payer: Self-pay | Admitting: Physician Assistant

## 2018-02-05 DIAGNOSIS — R55 Syncope and collapse: Secondary | ICD-10-CM

## 2018-02-11 ENCOUNTER — Ambulatory Visit (INDEPENDENT_AMBULATORY_CARE_PROVIDER_SITE_OTHER): Payer: Medicaid Other

## 2018-02-11 ENCOUNTER — Ambulatory Visit (HOSPITAL_COMMUNITY): Payer: Medicaid Other | Attending: Cardiovascular Disease

## 2018-02-11 ENCOUNTER — Other Ambulatory Visit: Payer: Self-pay

## 2018-02-11 DIAGNOSIS — R55 Syncope and collapse: Secondary | ICD-10-CM

## 2018-02-15 ENCOUNTER — Telehealth: Payer: Self-pay

## 2018-02-15 ENCOUNTER — Other Ambulatory Visit: Payer: Self-pay

## 2018-02-15 DIAGNOSIS — Z3480 Encounter for supervision of other normal pregnancy, unspecified trimester: Secondary | ICD-10-CM

## 2018-02-15 NOTE — Telephone Encounter (Signed)
Spoke with patient to give lab results. She stated she is supposed to be wearing a heart Monitor but had an allergic reaction and is waiting for the company to mail her some hyopalleregic pads. Patient states she passed out today and wanted to know if there was anything she needs to be doing.  Please advise.

## 2018-02-16 ENCOUNTER — Encounter: Payer: Self-pay | Admitting: *Deleted

## 2018-02-16 ENCOUNTER — Other Ambulatory Visit: Payer: Medicaid Other

## 2018-02-16 ENCOUNTER — Ambulatory Visit (INDEPENDENT_AMBULATORY_CARE_PROVIDER_SITE_OTHER): Payer: Medicaid Other | Admitting: Student

## 2018-02-16 ENCOUNTER — Encounter: Payer: Self-pay | Admitting: Advanced Practice Midwife

## 2018-02-16 VITALS — BP 132/76 | HR 80 | Wt 221.0 lb

## 2018-02-16 DIAGNOSIS — R55 Syncope and collapse: Secondary | ICD-10-CM

## 2018-02-16 DIAGNOSIS — Z3480 Encounter for supervision of other normal pregnancy, unspecified trimester: Secondary | ICD-10-CM

## 2018-02-16 DIAGNOSIS — Z23 Encounter for immunization: Secondary | ICD-10-CM

## 2018-02-16 DIAGNOSIS — O34219 Maternal care for unspecified type scar from previous cesarean delivery: Secondary | ICD-10-CM

## 2018-02-16 NOTE — Patient Instructions (Signed)
Research childbirth classes and hospital preregistration at [email or web address]  Fetal Movement Counts Patient Name: ________________________________________________ Patient Due Date: ____________________ What is a fetal movement count? A fetal movement count is the number of times that you feel your baby move during a certain amount of time. This may also be called a fetal kick count. A fetal movement count is recommended for every pregnant woman. You may be asked to start counting fetal movements as early as week 28 of your pregnancy. Pay attention to when your baby is most active. You may notice your baby's sleep and wake cycles. You may also notice things that make your baby move more. You should do a fetal movement count:  When your baby is normally most active.  At the same time each day.  A good time to count movements is while you are resting, after having something to eat and drink. How do I count fetal movements? 1. Find a quiet, comfortable area. Sit, or lie down on your side. 2. Write down the date, the start time and stop time, and the number of movements that you felt between those two times. Take this information with you to your health care visits. 3. For 2 hours, count kicks, flutters, swishes, rolls, and jabs. You should feel at least 10 movements during 2 hours. 4. You may stop counting after you have felt 10 movements. 5. If you do not feel 10 movements in 2 hours, have something to eat and drink. Then, keep resting and counting for 1 hour. If you feel at least 4 movements during that hour, you may stop counting. Contact a health care provider if:  You feel fewer than 4 movements in 2 hours.  Your baby is not moving like he or she usually does. Date: ____________ Start time: ____________ Stop time: ____________ Movements: ____________ Date: ____________ Start time: ____________ Stop time: ____________ Movements: ____________ Date: ____________ Start time: ____________  Stop time: ____________ Movements: ____________ Date: ____________ Start time: ____________ Stop time: ____________ Movements: ____________ Date: ____________ Start time: ____________ Stop time: ____________ Movements: ____________ Date: ____________ Start time: ____________ Stop time: ____________ Movements: ____________ Date: ____________ Start time: ____________ Stop time: ____________ Movements: ____________ Date: ____________ Start time: ____________ Stop time: ____________ Movements: ____________ Date: ____________ Start time: ____________ Stop time: ____________ Movements: ____________ This information is not intended to replace advice given to you by your health care provider. Make sure you discuss any questions you have with your health care provider. Document Released: 08/13/2006 Document Revised: 03/12/2016 Document Reviewed: 08/23/2015 Elsevier Interactive Patient Education  2018 [employer]  Braxton Hicks Contractions Contractions of the uterus can occur throughout pregnancy, but they are not always a sign that you are in labor. You may have practice contractions called Braxton Hicks contractions. These false labor contractions are sometimes confused with true labor. What are Braxton Hicks contractions? Braxton Hicks contractions are tightening movements that occur in the muscles of the uterus before labor. Unlike true labor contractions, these contractions do not result in opening (dilation) and thinning of the cervix. Toward the end of pregnancy (32-34 weeks), Braxton Hicks contractions can happen more often and may become stronger. These contractions are sometimes difficult to tell apart from true labor because they can be very uncomfortable. You should not feel embarrassed if you go to the hospital with false labor. Sometimes, the only way to tell if you are in true labor is for your health care provider to look for changes in the cervix. The health care provider will   do a physical  exam and may monitor your contractions. If you are not in true labor, the exam should show that your cervix is not dilating and your water has not broken. If there are other health problems associated with your pregnancy, it is completely safe for you to be sent home with false labor. You may continue to have Braxton Hicks contractions until you go into true labor. How to tell the difference between true labor and false labor True labor  Contractions last 30-70 seconds.  Contractions become very regular.  Discomfort is usually felt in the top of the uterus, and it spreads to the lower abdomen and low back.  Contractions do not go away with walking.  Contractions usually become more intense and increase in frequency.  The cervix dilates and gets thinner. False labor  Contractions are usually shorter and not as strong as true labor contractions.  Contractions are usually irregular.  Contractions are often felt in the front of the lower abdomen and in the groin.  Contractions may go away when you walk around or change positions while lying down.  Contractions get weaker and are shorter-lasting as time goes on.  The cervix usually does not dilate or become thin. Follow these instructions at home:  Take over-the-counter and prescription medicines only as told by your health care provider.  Keep up with your usual exercises and follow other instructions from your health care provider.  Eat and drink lightly if you think you are going into labor.  If Braxton Hicks contractions are making you uncomfortable: ? Change your position from lying down or resting to walking, or change from walking to resting. ? Sit and rest in a tub of warm water. ? Drink enough fluid to keep your urine pale yellow. Dehydration may cause these contractions. ? Do slow and deep breathing several times an hour.  Keep all follow-up prenatal visits as told by your health care provider. This is  important. Contact a health care provider if:  You have a fever.  You have continuous pain in your abdomen. Get help right away if:  Your contractions become stronger, more regular, and closer together.  You have fluid leaking or gushing from your vagina.  You pass blood-tinged mucus (bloody show).  You have bleeding from your vagina.  You have low back pain that you never had before.  You feel your baby's head pushing down and causing pelvic pressure.  Your baby is not moving inside you as much as it used to. Summary  Contractions that occur before labor are called Braxton Hicks contractions, false labor, or practice contractions.  Braxton Hicks contractions are usually shorter, weaker, farther apart, and less regular than true labor contractions. True labor contractions usually become progressively stronger and regular and they become more frequent.  Manage discomfort from Braxton Hicks contractions by changing position, resting in a warm bath, drinking plenty of water, or practicing deep breathing. This information is not intended to replace advice given to you by your health care provider. Make sure you discuss any questions you have with your health care provider. Document Released: 11/27/2016 Document Revised: 11/27/2016 Document Reviewed: 11/27/2016 Elsevier Interactive Patient Education  2018 Elsevier Inc.    

## 2018-02-16 NOTE — Progress Notes (Signed)
   PRENATAL VISIT NOTE  Subjective:  Julia Armstrong is a 27 y.o. [id number] at 4759w2d being seen today for ongoing prenatal care.  She is currently monitored for the following issues for this high-risk pregnancy and has Supervision of other normal pregnancy, antepartum; Previous cesarean delivery, antepartum; History of depression; Obesity (BMI 35.0-39.9 without comorbidity); and Syncope on their problem list.  Patient reports no complaints.  Contractions: Not present. Vag. Bleeding: None.  Movement: Present. Denies leaking of fluid.  Reports continued episode of syncope, last time yesterday. Has been seen by cardiology & is currently wearing a holter monitor.   The following portions of the patient's history were reviewed and updated as appropriate: allergies, current medications, past family history, past medical history, past social history, past surgical history and problem list. Problem list updated.  Objective:   Vitals:   02/16/18 1049  BP: 132/76  Pulse: 80  Weight: 221 lb (100.2 kg)    Fetal Status: Fetal Heart Rate (bpm): 158 Fundal Height: 32 cm Movement: Present     General:  Alert, oriented and cooperative. Patient is in no acute distress.  Skin: Skin is warm and dry. No rash noted.   Cardiovascular: Normal heart rate noted  Respiratory: Normal respiratory effort, no problems with respiration noted  Abdomen: Soft, gravid, appropriate for gestational age.  Pain/Pressure: Present     Pelvic: Cervical exam deferred        Extremities: Normal range of motion.  Edema: Trace  Mental Status: Normal mood and affect. Normal behavior. Normal judgment and thought content.   Assessment and Plan:  Pregnancy: [id number] at 7659w2d  1. Supervision of other normal pregnancy, antepartum   2. Syncope, unspecified syncope type -last episode yesterday. Going to cardiology. Wearing holter monitor - in the future, recommend coming to MAU for fetal monitoring if she falls with her syncopal  episodes  3. Previous cesarean delivery, antepartum -schedule with MD for next visit -hx c/s x 3 & will have a repeat  4. Need for Tdap vaccination  - Tdap vaccine greater than or equal to 7yo IM  Preterm labor symptoms and general obstetric precautions including but not limited to vaginal bleeding, contractions, leaking of fluid and fetal movement were reviewed in detail with the patient. Please refer to After Visit Summary for other counseling recommendations.  Return in about 2 weeks (around 03/02/2018) for Routine OB.  Future Appointments  Date Time Provider Department Center  03/04/2018 10:35 AM Adam PhenixArnold, James G, MD [facility]  03/19/2018  2:00 PM Azalee CourseMeng, Hao, [place] [facility]    Judeth HornErin Tinna Kolker, NP

## 2018-02-17 LAB — CBC
Hematocrit: 36.4 % (ref 34.0–46.6)
Hemoglobin: 11.8 g/dL (ref 11.1–15.9)
MCH: 25.5 pg — ABNORMAL LOW (ref 26.6–33.0)
MCHC: 32.4 g/dL (ref 31.5–35.7)
MCV: 79 fL (ref 79–97)
Platelets: 210 10*3/uL (ref 150–450)
RBC: 4.62 x10E6/uL (ref 3.77–5.28)
RDW: 14.6 % (ref 12.3–15.4)
WBC: 7.3 10*3/uL (ref 3.4–10.8)

## 2018-02-17 LAB — GLUCOSE TOLERANCE, 2 HOURS W/ 1HR
GLUCOSE, 2 HOUR: 108 mg/dL (ref 65–152)
GLUCOSE, FASTING: 83 mg/dL (ref 65–91)
Glucose, 1 hour: 98 mg/dL (ref 65–179)

## 2018-02-17 LAB — HIV ANTIBODY (ROUTINE TESTING W REFLEX): HIV Screen 4th Generation wRfx: NONREACTIVE

## 2018-02-17 LAB — RPR: RPR Ser Ql: NONREACTIVE

## 2018-02-18 NOTE — Telephone Encounter (Signed)
So far, despite multiple syncope per week, her baby followup seems to be pretty stable. So I am ok with her wait for the hypoallergenic pads.

## 2018-02-19 NOTE — Telephone Encounter (Signed)
Spoke with patient and she states she has received her hypoallergenic pads and notified directly of Hao's recommendations.

## 2018-02-25 ENCOUNTER — Encounter (HOSPITAL_COMMUNITY): Payer: Self-pay | Admitting: Emergency Medicine

## 2018-02-25 ENCOUNTER — Inpatient Hospital Stay (HOSPITAL_COMMUNITY)
Admission: AD | Admit: 2018-02-25 | Discharge: 2018-02-26 | Discharge status: Against Medical Advice | Payer: BLUE CROSS/BLUE SHIELD | Source: Ambulatory Visit | Attending: Obstetrics & Gynecology | Admitting: Obstetrics & Gynecology

## 2018-02-25 DIAGNOSIS — O26893 Other specified pregnancy related conditions, third trimester: Secondary | ICD-10-CM | POA: Diagnosis not present

## 2018-02-25 DIAGNOSIS — Z79899 Other long term (current) drug therapy: Secondary | ICD-10-CM | POA: Insufficient documentation

## 2018-02-25 DIAGNOSIS — O99323 Drug use complicating pregnancy, third trimester: Secondary | ICD-10-CM

## 2018-02-25 DIAGNOSIS — R55 Syncope and collapse: Secondary | ICD-10-CM | POA: Insufficient documentation

## 2018-02-25 DIAGNOSIS — O9989 Other specified diseases and conditions complicating pregnancy, childbirth and the puerperium: Secondary | ICD-10-CM | POA: Diagnosis not present

## 2018-02-25 DIAGNOSIS — O99213 Obesity complicating pregnancy, third trimester: Secondary | ICD-10-CM | POA: Insufficient documentation

## 2018-02-25 DIAGNOSIS — Z3A28 28 weeks gestation of pregnancy: Secondary | ICD-10-CM | POA: Insufficient documentation

## 2018-02-25 DIAGNOSIS — F1990 Other psychoactive substance use, unspecified, uncomplicated: Secondary | ICD-10-CM | POA: Clinically undetermined

## 2018-02-25 LAB — CBC WITH DIFFERENTIAL/PLATELET
ABS IMMATURE GRANULOCYTES: 0 10*3/uL (ref 0.0–0.1)
BASOS ABS: 0 10*3/uL (ref 0.0–0.1)
BASOS PCT: 1 %
EOS ABS: 0.1 10*3/uL (ref 0.0–0.7)
Eosinophils Relative: 1 %
HCT: 36 % (ref 36.0–46.0)
Hemoglobin: 11.3 g/dL — ABNORMAL LOW (ref 12.0–15.0)
IMMATURE GRANULOCYTES: 1 %
Lymphocytes Relative: 27 %
Lymphs Abs: 2.2 10*3/uL (ref 0.7–4.0)
MCH: 25.2 pg — AB (ref 26.0–34.0)
MCHC: 31.4 g/dL (ref 30.0–36.0)
MCV: 80.2 fL (ref 78.0–100.0)
MONOS PCT: 6 %
Monocytes Absolute: 0.5 10*3/uL (ref 0.1–1.0)
NEUTROS ABS: 5.5 10*3/uL (ref 1.7–7.7)
NEUTROS PCT: 66 %
PLATELETS: 204 10*3/uL (ref 150–400)
RBC: 4.49 MIL/uL (ref 3.87–5.11)
RDW: 14.1 % (ref 11.5–15.5)
WBC: 8.4 10*3/uL (ref 4.0–10.5)

## 2018-02-25 LAB — COMPREHENSIVE METABOLIC PANEL
ALT: 11 U/L (ref 0–44)
AST: 19 U/L (ref 15–41)
Albumin: 2.7 g/dL — ABNORMAL LOW (ref 3.5–5.0)
Alkaline Phosphatase: 66 U/L (ref 38–126)
Anion gap: 12 (ref 5–15)
BUN: <5 mg/dL — ABNORMAL LOW (ref 6–20)
CHLORIDE: 106 mmol/L (ref 98–111)
CO2: 19 mmol/L — ABNORMAL LOW (ref 22–32)
Calcium: 8.7 mg/dL — ABNORMAL LOW (ref 8.9–10.3)
Creatinine, Ser: 0.51 mg/dL (ref 0.44–1.00)
Glucose, Bld: 76 mg/dL (ref 70–99)
POTASSIUM: 3.3 mmol/L — AB (ref 3.5–5.1)
SODIUM: 137 mmol/L (ref 135–145)
Total Bilirubin: 0.3 mg/dL (ref 0.3–1.2)
Total Protein: 6.3 g/dL — ABNORMAL LOW (ref 6.5–8.1)

## 2018-02-25 LAB — URINALYSIS, ROUTINE W REFLEX MICROSCOPIC
Bilirubin Urine: NEGATIVE
Glucose, UA: NEGATIVE mg/dL
Hgb urine dipstick: NEGATIVE
Ketones, ur: 5 mg/dL — AB
Leukocytes, UA: NEGATIVE
NITRITE: NEGATIVE
Protein, ur: NEGATIVE mg/dL
SPECIFIC GRAVITY, URINE: 1.008 (ref 1.005–1.030)
pH: 7 (ref 5.0–8.0)

## 2018-02-25 LAB — CBG MONITORING, ED: GLUCOSE-CAPILLARY: 82 mg/dL (ref 70–99)

## 2018-02-25 NOTE — ED Triage Notes (Signed)
Patient reports syncopal episode at home this evening and fell with right lateral abdominal pain . She is [redacted] weeks pregnant , denies vaginal bleeding , alert and oriented , denies fever or chills . Patient screened by PA at triage .

## 2018-02-25 NOTE — ED Notes (Signed)
Pt. In triage a3

## 2018-02-25 NOTE — ED Notes (Signed)
Rapid response nurse advised triage nurse to monitor pt.'s fetal ( doppler) heart rate and call her back if needed.

## 2018-02-25 NOTE — ED Notes (Signed)
Rapid response nurse paged by Diplomatic Services operational officersecretary .

## 2018-02-26 ENCOUNTER — Encounter (HOSPITAL_COMMUNITY): Payer: Self-pay | Admitting: *Deleted

## 2018-02-26 DIAGNOSIS — R55 Syncope and collapse: Secondary | ICD-10-CM

## 2018-02-26 DIAGNOSIS — F1990 Other psychoactive substance use, unspecified, uncomplicated: Secondary | ICD-10-CM

## 2018-02-26 DIAGNOSIS — O99323 Drug use complicating pregnancy, third trimester: Secondary | ICD-10-CM

## 2018-02-26 DIAGNOSIS — O26893 Other specified pregnancy related conditions, third trimester: Secondary | ICD-10-CM | POA: Diagnosis present

## 2018-02-26 DIAGNOSIS — Z79899 Other long term (current) drug therapy: Secondary | ICD-10-CM | POA: Diagnosis not present

## 2018-02-26 DIAGNOSIS — Z3A28 28 weeks gestation of pregnancy: Secondary | ICD-10-CM | POA: Diagnosis not present

## 2018-02-26 DIAGNOSIS — O99213 Obesity complicating pregnancy, third trimester: Secondary | ICD-10-CM | POA: Diagnosis not present

## 2018-02-26 DIAGNOSIS — O9989 Other specified diseases and conditions complicating pregnancy, childbirth and the puerperium: Secondary | ICD-10-CM

## 2018-02-26 HISTORY — DX: Other psychoactive substance use, unspecified, uncomplicated: F19.90

## 2018-02-26 LAB — RAPID URINE DRUG SCREEN, HOSP PERFORMED
Amphetamines: NOT DETECTED
Barbiturates: NOT DETECTED
Benzodiazepines: NOT DETECTED
Cocaine: NOT DETECTED
Opiates: NOT DETECTED
Tetrahydrocannabinol: POSITIVE — AB

## 2018-02-26 LAB — URINALYSIS, ROUTINE W REFLEX MICROSCOPIC
BILIRUBIN URINE: NEGATIVE
GLUCOSE, UA: NEGATIVE mg/dL
HGB URINE DIPSTICK: NEGATIVE
Ketones, ur: 20 mg/dL — AB
Leukocytes, UA: NEGATIVE
Nitrite: NEGATIVE
PH: 7 (ref 5.0–8.0)
Protein, ur: NEGATIVE mg/dL
SPECIFIC GRAVITY, URINE: 1.006 (ref 1.005–1.030)

## 2018-02-26 LAB — HCG, QUANTITATIVE, PREGNANCY: HCG, BETA CHAIN, QUANT, S: 8736 m[IU]/mL — AB (ref <5)

## 2018-02-26 MED ORDER — LACTATED RINGERS IV BOLUS
1000.0000 mL | Freq: Once | INTRAVENOUS | Status: AC
  Administered 2018-02-26: 1000 mL via INTRAVENOUS

## 2018-02-26 MED ORDER — POTASSIUM CHLORIDE CRYS ER 20 MEQ PO TBCR
40.0000 meq | EXTENDED_RELEASE_TABLET | Freq: Once | ORAL | Status: AC
  Administered 2018-02-26: 40 meq via ORAL
  Filled 2018-02-26: qty 2

## 2018-02-26 NOTE — ED Notes (Signed)
Rapid OB paged, pt placed on OB monitoring

## 2018-02-26 NOTE — Progress Notes (Signed)
Patient is a G6P3 at 9728wks 5da, [facility] after a syncopal episode this evening.   Intermitent tracing since 0050, baseline fetal heart tones 120-130bpm. Monitor hand-held from 0148 to 0204 with fetal heart tones adible throughout, reactive NST Baseline 120bpm, with two accelerations to 140-150 bpm in 20 minute period and no decelerations. No contractions noted.

## 2018-02-26 NOTE — ED Notes (Signed)
Rapid OB repaged

## 2018-02-26 NOTE — ED Notes (Signed)
Rapid OB arrived. At bedside.

## 2018-02-26 NOTE — ED Provider Notes (Signed)
[facility] EMERGENCY DEPARTMENT Provider Note   CSN: [id number] Arrival date & time: 02/25/18  2210     History   Chief Complaint Chief Complaint  Patient presents with  . Syncope    [redacted] weeks pregnant    HPI Julia Armstrong is a 27 y.o. female.  The history is provided by the patient.  Loss of Consciousness   This is a new problem. The current episode started 1 to 2 hours ago. The problem has been gradually improving. She lost consciousness for a period of 1 to 5 minutes. The problem is associated with normal activity. Associated symptoms include abdominal pain. Pertinent negatives include chest pain, fever and headaches. She has tried nothing for the symptoms.  Patient is currently [redacted] weeks pregnant.  She reports a episode of syncope tonight.  She was getting out of shower, and next thing she knew she was lying in the floor.  On the way to the floor she hit her abdomen against a fan.  She is now reporting abdominal pain.  She denies any new chest pain or shortness of breath.  No headache.  She denies any vaginal bleeding or loss of fluid.  No change in fetal activity.  She has had extensive work-up for syncope during this pregnancy.  No new medications.  Past Medical History:  Diagnosis Date  . Gallstones     Patient Active Problem List   Diagnosis Date Noted  . Syncope 02/16/2018  . Obesity (BMI 35.0-39.9 without comorbidity) 12/01/2017  . Supervision of other normal pregnancy, antepartum 11/03/2017  . Previous cesarean delivery, antepartum 11/03/2017  . History of depression 11/03/2017    Past Surgical History:  Procedure Laterality Date  . CESAREAN SECTION     C/S x 3  . DILATION AND EVACUATION N/A 02/18/2017   Procedure: DILATATION AND EVACUATION;  Surgeon: Tereso Newcomer, MD;  Location: [facility];  Service: Gynecology;  Laterality: N/A;  . WISDOM TOOTH EXTRACTION       OB History    Gravida  6   Para  3   Term  3   Preterm      AB  2     Living  3     SAB  2   TAB      Ectopic      Multiple      Live Births  3            Home Medications    Prior to Admission medications   Medication Sig Start Date End Date Taking? Authorizing Provider  acetaminophen (TYLENOL) 325 MG tablet Take 650 mg by mouth every 6 (six) hours as needed for headache.    [provider]  butalbital-acetaminophen-caffeine (FIORICET, ESGIC) [phone] MG tablet  01/14/18   [provider]  Doxylamine-Pyridoxine (DICLEGIS) 10-10 MG TBEC Take 2 tabs at bedtime. If needed, add another tab in the morning. If needed, add another tab in the afternoon, up to 4 tabs/day. 01/19/18   Leftwich-Kirby, Wilmer Floor, CNM  feeding supplement, ENSURE COMPLETE, (ENSURE COMPLETE) LIQD Take 237 mLs by mouth 2 (two) times daily between meals. 01/19/18   Leftwich-Kirby, Wilmer Floor, CNM  metoCLOPramide (REGLAN) 10 MG tablet Take 1 tablet (10 mg total) by mouth every 8 (eight) hours as needed for nausea. 01/19/18   Leftwich-Kirby, Wilmer Floor, CNM  ondansetron (ZOFRAN) 4 MG tablet Take 1 tablet (4 mg total) by mouth every 8 (eight) hours as needed for nausea or vomiting. 01/19/18  Leftwich-Kirby, Wilmer Floor, CNM  Prenatal Vit-Fe Fumarate-FA (PREPLUS) 27-1 MG TABS Take 1 tablet by mouth daily. 11/03/17   Sharyon Cable, CNM  promethazine (PHENERGAN) 25 MG suppository Place 1 suppository (25 mg total) rectally every 6 (six) hours as needed for nausea or vomiting. 01/19/18   Leftwich-Kirby, Wilmer Floor, CNM    Family History Family History  Problem Relation Age of Onset  . Cancer Other   . Cancer Maternal Grandmother        breast cancer    Social History Social History   Tobacco Use  . Smoking status: Never Smoker  . Smokeless tobacco: Never Used  Substance Use Topics  . Alcohol use: No  . Drug use: No     Allergies   Patient has no known allergies.   Review of Systems Review of Systems  Constitutional: Negative for chills and fever.  Respiratory:  Negative for shortness of breath.   Cardiovascular: Positive for syncope. Negative for chest pain.  Gastrointestinal: Positive for abdominal pain.  Genitourinary: Negative for vaginal bleeding and vaginal discharge.  Neurological: Positive for syncope. Negative for headaches.  All other systems reviewed and are negative.    Physical Exam Updated Vital Signs BP 125/74 (BP Location: Right Arm)   Pulse 76   Temp 98.5 F (36.9 C) (Oral)   Resp 18   Ht 1.702 m (5\' 7" )   LMP 08/05/2017 (LMP Unknown)   SpO2 100%   BMI 34.61 kg/m   Physical Exam  CONSTITUTIONAL: Well developed/well nourished HEAD: Normocephalic/atraumatic EYES: EOMI/PERRL ENMT: Mucous membranes moist, no tongue laceration NECK: supple no meningeal signs SPINE/BACK:entire spine nontender, no bruising/crepitance/stepoffs noted to spine CV: S1/S2 noted, no murmurs/rubs/gallops noted LUNGS: Lungs are clear to auscultation bilaterally, no apparent distress ABDOMEN: soft, gravid, mild tenderness to the lower abdomen, no bruising noted, no rebound or guarding, bowel sounds noted throughout abdomen GU:no cva tenderness, speculum exam reveals no bleeding, no fluid, no fetal products, nurse chaperone present for exam NEURO: Pt is awake/alert/appropriate, moves all extremitiesx4.  No facial droop.   EXTREMITIES: pulses normal/equal, full ROM, no calf tenderness, no signs of trauma SKIN: warm, color normal PSYCH: no abnormalities of mood noted, alert and oriented to situation  ED Treatments / Results  Labs (all labs ordered are listed, but only abnormal results are displayed) Labs Reviewed  CBC WITH DIFFERENTIAL/PLATELET - Abnormal; Notable for the following components:      Result Value   Hemoglobin 11.3 (*)    MCH 25.2 (*)    All other components within normal limits  COMPREHENSIVE METABOLIC PANEL - Abnormal; Notable for the following components:   Potassium 3.3 (*)    CO2 19 (*)    BUN <5 (*)    Calcium 8.7 (*)     Total Protein 6.3 (*)    Albumin 2.7 (*)    All other components within normal limits  URINALYSIS, ROUTINE W REFLEX MICROSCOPIC - Abnormal; Notable for the following components:   Ketones, ur 5 (*)    All other components within normal limits  HCG, QUANTITATIVE, PREGNANCY - Abnormal; Notable for the following components:   hCG, Beta Chain, Quant, S 8,736 (*)    All other components within normal limits  CBG MONITORING, ED  CBG MONITORING, ED    EKG EKG Interpretation  Date/Time:  Thursday February 25 2018 22:16:59 EDT Ventricular Rate:  79 PR Interval:  180 QRS Duration: 84 QT Interval:  378 QTC Calculation: 433 R Axis:   47 Text Interpretation:  Normal sinus rhythm Normal ECG No significant change since last tracing Confirmed by Zadie RhineWickline, Maelle Sheaffer ([id number]) on 02/25/2018 11:57:29 PM   Radiology No results found.  Procedures Procedures   Medications Ordered in ED Medications  lactated ringers bolus 1,000 mL (has no administration in time range)  potassium chloride SA (K-DUR,KLOR-CON) CR tablet 40 mEq (has no administration in time range)     Initial Impression / Assessment and Plan / ED Course  I have reviewed the triage vital signs and the nursing notes.  Pertinent labs results that were available during my care of the patient were reviewed by me and considered in my medical decision making (see chart for details).     12:20 AM Revealed mild dehydration and hypokalemia.  She is had extensive work-up from cardiology including echocardiogram for her syncope.  Unclear cause of her syncope recently.  No change in her EKG tonight.  This remains stable at this time.  Vitals are appropriate.  However she will need to undergo fetal monitoring due to fall and injury to the abdomen.  No other signs of trauma  currently fetal heart rate is in the 130s.  Rapid OB response nurse has been paged.  I discussed the case with Dr. Macon LargeAnyanwu, on[facility] at [facility].  She will accept in  transfer 12:36 AM Patient resting comfortably, awake and alert and in no distress.  She is on the phone at this time. IV fluids and oral potassium been ordered.  She is awaiting transfer.  FHT is still in the 130s.  Due to technical difficulties there is no toco/fetal monitoring this time, however patient is in no distress and no worsening symptoms at this time. Final Clinical Impressions(s) / ED Diagnoses   Final diagnoses:  Syncope and collapse  [redacted] weeks gestation of pregnancy    ED Discharge Orders    None       Zadie RhineWickline, Tamie Minteer, MD 02/26/18 [phone]

## 2018-02-26 NOTE — MAU Note (Addendum)
Pt [facility] to [facility] for a syncope episode in which she hit the right side of her abdomen. Pt states the pain is a 7/10 and is sharp and constant. Pt reports nausea and pressure in her vagina. No bleeding or LOF. Pt states not really feeling her move pt states that is a on going problem.

## 2018-02-26 NOTE — MAU Note (Signed)
Pt left AMA form reviewed and signed with patient.

## 2018-02-26 NOTE — ED Notes (Signed)
[facility] called for transport to MAU.  

## 2018-02-26 NOTE — MAU Provider Note (Signed)
Chief Complaint:  Syncope ([redacted] weeks pregnant)   First Provider Initiated Contact with Patient 02/26/18 0326     HPI: Julia Armstrong is a 27 y.o. [id number] at 33w5dwho presents to maternity admissions reporting having a syncopal episode tonight resulting in falling onto right side.  Was seen in the ED but Monitoring was not done    They only did a short NST. So she reports here for extended fetal monitoring. She reports good fetal movement, denies LOF, vaginal bleeding, vaginal itching/burning, urinary symptoms, h/a, n/v, diarrhea, constipation or fever/chills.  Dizziness  This is a recurrent problem. The current episode started today. The problem occurs intermittently. Associated symptoms include abdominal pain (on right sided from fall). Pertinent negatives include no chest pain, chills, fever, headaches, myalgias, nausea, urinary symptoms, visual change, vomiting or weakness. Nothing aggravates the symptoms. She has tried nothing for the symptoms.   Has had Cardiology workup for repeated episodes of syncope.  Has not seen neurology. Unclear etiology.  RN Note: Pt presents to MAU via [facility] from [facility] for a syncope episode in which she hit the right side of her abdomen. Pt states the pain is a 7/10 and is sharp and constant. Pt reports nausea and pressure in her vagina. No bleeding or LOF. Pt states not really feeling her move pt states that is a on going problem.   Past Medical History: Past Medical History:  Diagnosis Date  . Gallstones     Past obstetric history: OB History  Gravida Para Term Preterm AB Living  6 3 3   2 3   SAB TAB Ectopic Multiple Live Births  2       3    # Outcome Date GA Lbr Len/2nd Weight Sex Delivery Anes PTL Lv  6 Current           5 SAB 01/25/17          4 Term 02/21/15    M CS-LTranv   LIV  3 Term 06/27/13    M CS-LTranv   LIV  2 Term 07/09/12    M CS-LTranv   LIV  1 SAB             Past Surgical History: Past Surgical History:  Procedure  Laterality Date  . CESAREAN SECTION     C/S x 3  . DILATION AND EVACUATION N/A 02/18/2017   Procedure: DILATATION AND EVACUATION;  Surgeon: Tereso Newcomer, MD;  Location: [facility];  Service: Gynecology;  Laterality: N/A;  . WISDOM TOOTH EXTRACTION      Family History: Family History  Problem Relation Age of Onset  . Cancer Other   . Cancer Maternal Grandmother        breast cancer    Social History: Social History   Tobacco Use  . Smoking status: Never Smoker  . Smokeless tobacco: Never Used  Substance Use Topics  . Alcohol use: No  . Drug use: No    Allergies: No Known Allergies  Meds:  Medications Prior to Admission  Medication Sig Dispense Refill Last Dose  . Doxylamine-Pyridoxine (DICLEGIS) 10-10 MG TBEC Take 2 tabs at bedtime. If needed, add another tab in the morning. If needed, add another tab in the afternoon, up to 4 tabs/day. 100 tablet 5 02/25/2018 at Unknown time  . feeding supplement, ENSURE COMPLETE, (ENSURE COMPLETE) LIQD Take 237 mLs by mouth 2 (two) times daily between meals. 50 Bottle 10 02/25/2018 at Unknown time  . metoCLOPramide (REGLAN) 10 MG tablet Take 1  tablet (10 mg total) by mouth every 8 (eight) hours as needed for nausea. 60 tablet 1 Past Month at Unknown time  . Prenatal Vit-Fe Fumarate-FA (PREPLUS) 27-1 MG TABS Take 1 tablet by mouth daily. 30 tablet 13 Past Week at Unknown time  . acetaminophen (TYLENOL) 325 MG tablet Take 650 mg by mouth every 6 (six) hours as needed for headache.   Unknown at Unknown time  . butalbital-acetaminophen-caffeine (FIORICET, ESGIC) 50-325-40 MG tablet    Unknown at Unknown time  . ondansetron (ZOFRAN) 4 MG tablet Take 1 tablet (4 mg total) by mouth every 8 (eight) hours as needed for nausea or vomiting. 20 tablet 3 Unknown at Unknown time  . promethazine (PHENERGAN) 25 MG suppository Place 1 suppository (25 mg total) rectally every 6 (six) hours as needed for nausea or vomiting. 12 suppository 5 Unknown at Unknown time     I have reviewed patient's Past Medical Hx, Surgical Hx, Family Hx, Social Hx, medications and allergies.   ROS:  Review of Systems  Constitutional: Negative for chills and fever.  Cardiovascular: Negative for chest pain.  Gastrointestinal: Positive for abdominal pain (on right sided from fall). Negative for nausea and vomiting.  Musculoskeletal: Negative for myalgias.  Neurological: Positive for dizziness. Negative for weakness and headaches.   Other systems negative  Physical Exam   Patient Vitals for the past 24 hrs:  BP Temp Temp src Pulse Resp SpO2 Height  02/26/18 0303 127/70 97.7 F (36.5 C) - 74 - - -  02/26/18 0235 - - - 71 (!) 28 99 % -  02/26/18 0115 - - - 72 20 100 % -  02/26/18 0105 - - - 78 19 100 % -  02/26/18 0015 (!) 110/57 - - 73 (!) 21 98 % -  02/25/18 2215 125/74 98.5 F (36.9 C) Oral 76 18 100 % 5\' 7"  (1.702 m)   Constitutional: Well-developed, well-nourished female in no acute distress.  Cardiovascular: normal rate and rhythm Respiratory: normal effort, clear to auscultation bilaterally GI: Abd soft, non-tender, gravid appropriate for gestational age.   No rebound or guarding. MS: Extremities nontender, no edema, normal ROM Neurologic: Alert and oriented x 4.  GU: Neg CVAT.  PELVIC EXAM: c/o rectal pressure, need "to push". Dilation: Closed Effacement (%): Thick Cervical Position: Posterior Exam by:: Wynelle Bourgeois CNM   FHT:  Baseline 120-130 , moderate variability, accelerations present, no decelerations Contractions: uterine irritability with occasional  Irregular contractions   Labs: Results for orders placed or performed during the hospital encounter of 02/25/18 (from the past 24 hour(s))  CBG monitoring, ED     Status: None   Collection Time: 02/25/18 10:28 PM  Result Value Ref Range   Glucose-Capillary 82 70 - 99 mg/dL   Comment 1 Notify RN    Comment 2 Document in Chart   CBC with Differential     Status: Abnormal   Collection  Time: 02/25/18 10:30 PM  Result Value Ref Range   WBC 8.4 4.0 - 10.5 K/uL   RBC 4.49 3.87 - 5.11 MIL/uL   Hemoglobin 11.3 (L) 12.0 - 15.0 g/dL   HCT 16.1 09.6 - 04.5 %   MCV 80.2 78.0 - 100.0 fL   MCH 25.2 (L) 26.0 - 34.0 pg   MCHC 31.4 30.0 - 36.0 g/dL   RDW 40.9 81.1 - 91.4 %   Platelets 204 150 - 400 K/uL   Neutrophils Relative % 66 %   Neutro Abs 5.5 1.7 - 7.7 K/uL  Lymphocytes Relative 27 %   Lymphs Abs 2.2 0.7 - 4.0 K/uL   Monocytes Relative 6 %   Monocytes Absolute 0.5 0.1 - 1.0 K/uL   Eosinophils Relative 1 %   Eosinophils Absolute 0.1 0.0 - 0.7 K/uL   Basophils Relative 1 %   Basophils Absolute 0.0 0.0 - 0.1 K/uL   Immature Granulocytes 1 %   Abs Immature Granulocytes 0.0 0.0 - 0.1 K/uL  Comprehensive metabolic panel     Status: Abnormal   Collection Time: 02/25/18 10:30 PM  Result Value Ref Range   Sodium 137 135 - 145 mmol/L   Potassium 3.3 (L) 3.5 - 5.1 mmol/L   Chloride 106 98 - 111 mmol/L   CO2 19 (L) 22 - 32 mmol/L   Glucose, Bld 76 70 - 99 mg/dL   BUN <5 (L) 6 - 20 mg/dL   Creatinine, Ser 2.950.51 0.44 - 1.00 mg/dL   Calcium 8.7 (L) 8.9 - 10.3 mg/dL   Total Protein 6.3 (L) 6.5 - 8.1 g/dL   Albumin 2.7 (L) 3.5 - 5.0 g/dL   AST 19 15 - 41 U/L   ALT 11 0 - 44 U/L   Alkaline Phosphatase 66 38 - 126 U/L   Total Bilirubin 0.3 0.3 - 1.2 mg/dL   GFR calc non Af Amer >60 >60 mL/min   GFR calc Af Amer >60 >60 mL/min   Anion gap 12 5 - 15  hCG, quantitative, pregnancy     Status: Abnormal   Collection Time: 02/25/18 10:30 PM  Result Value Ref Range   hCG, Beta Chain, Quant, S 8,736 (H) <5 mIU/mL  Urinalysis, Routine w reflex microscopic     Status: Abnormal   Collection Time: 02/25/18 10:40 PM  Result Value Ref Range   Color, Urine YELLOW YELLOW   APPearance CLEAR CLEAR   Specific Gravity, Urine 1.008 1.005 - 1.030   pH 7.0 5.0 - 8.0   Glucose, UA NEGATIVE NEGATIVE mg/dL   Hgb urine dipstick NEGATIVE NEGATIVE   Bilirubin Urine NEGATIVE NEGATIVE   Ketones, ur  5 (A) NEGATIVE mg/dL   Protein, ur NEGATIVE NEGATIVE mg/dL   Nitrite NEGATIVE NEGATIVE   Leukocytes, UA NEGATIVE NEGATIVE   B/Positive/-- (04/09 1141)  Imaging:  No results found.   MAU Course/MDM: I have reviewed results. They gave her PO potassium in ED.  States feels pressure but cervix closed and long.  Occasional contractions.  FHR reassuring.   Noted + UDS a year ago with cocaine and THC.  No other UDS's before or after that one. WIll recheck tonight.  >> + for THC only .   Patient notified of results.  States has not smoked MJ in "a very long time".  Discussed possibility that it could contribute to syncope. Consult Dr Macon LargeAnyanwu with presentation, exam findings and test results.  Treatments in MAU included EFM.    Assessment: 1. Syncope and collapse   2. [redacted] weeks gestation of pregnancy   3.     Reassuring fetal heart rate tracing 4.     Marijuana use in pregnancy  Plan: After some time of monitoring, patient decided she needed to leave because the FOB needs to sleep after night shift and she will not have a ride home. Discussed recommendation to monitor baby and she states she accepts risk of leaving AMA   Wynelle BourgeoisMarie Modell Fendrick CNM, MSN Certified Nurse-Midwife 02/26/2018 3:57 AM

## 2018-03-04 ENCOUNTER — Encounter: Payer: Self-pay | Admitting: Obstetrics & Gynecology

## 2018-03-04 ENCOUNTER — Encounter: Payer: Self-pay | Admitting: Advanced Practice Midwife

## 2018-03-10 ENCOUNTER — Encounter (HOSPITAL_COMMUNITY): Payer: Self-pay | Admitting: Emergency Medicine

## 2018-03-10 ENCOUNTER — Encounter: Payer: Self-pay | Admitting: Family Medicine

## 2018-03-10 ENCOUNTER — Other Ambulatory Visit: Payer: Self-pay

## 2018-03-10 ENCOUNTER — Other Ambulatory Visit (HOSPITAL_COMMUNITY): Payer: Self-pay

## 2018-03-10 ENCOUNTER — Emergency Department (HOSPITAL_COMMUNITY)
Admission: EM | Admit: 2018-03-10 | Discharge: 2018-03-10 | Disposition: A | Discharge status: Home / Self Care | Payer: Medicaid Other | Attending: Emergency Medicine | Admitting: Emergency Medicine

## 2018-03-10 ENCOUNTER — Emergency Department (HOSPITAL_COMMUNITY): Payer: Medicaid Other

## 2018-03-10 ENCOUNTER — Encounter (HOSPITAL_COMMUNITY): Payer: Self-pay

## 2018-03-10 ENCOUNTER — Ambulatory Visit (INDEPENDENT_AMBULATORY_CARE_PROVIDER_SITE_OTHER): Payer: Medicaid Other | Admitting: Medical

## 2018-03-10 ENCOUNTER — Encounter: Payer: Self-pay | Admitting: Medical

## 2018-03-10 VITALS — BP 119/78 | HR 100 | Wt 218.0 lb

## 2018-03-10 DIAGNOSIS — N898 Other specified noninflammatory disorders of vagina: Secondary | ICD-10-CM | POA: Insufficient documentation

## 2018-03-10 DIAGNOSIS — E669 Obesity, unspecified: Secondary | ICD-10-CM

## 2018-03-10 DIAGNOSIS — O9989 Other specified diseases and conditions complicating pregnancy, childbirth and the puerperium: Secondary | ICD-10-CM | POA: Diagnosis not present

## 2018-03-10 DIAGNOSIS — O2613 Low weight gain in pregnancy, third trimester: Secondary | ICD-10-CM

## 2018-03-10 DIAGNOSIS — O26893 Other specified pregnancy related conditions, third trimester: Secondary | ICD-10-CM

## 2018-03-10 DIAGNOSIS — R109 Unspecified abdominal pain: Secondary | ICD-10-CM

## 2018-03-10 DIAGNOSIS — Z3480 Encounter for supervision of other normal pregnancy, unspecified trimester: Secondary | ICD-10-CM

## 2018-03-10 DIAGNOSIS — O2612 Low weight gain in pregnancy, second trimester: Secondary | ICD-10-CM

## 2018-03-10 DIAGNOSIS — R55 Syncope and collapse: Secondary | ICD-10-CM

## 2018-03-10 DIAGNOSIS — O23593 Infection of other part of genital tract in pregnancy, third trimester: Secondary | ICD-10-CM | POA: Diagnosis not present

## 2018-03-10 DIAGNOSIS — O26843 Uterine size-date discrepancy, third trimester: Secondary | ICD-10-CM

## 2018-03-10 DIAGNOSIS — R52 Pain, unspecified: Secondary | ICD-10-CM

## 2018-03-10 DIAGNOSIS — O34219 Maternal care for unspecified type scar from previous cesarean delivery: Secondary | ICD-10-CM

## 2018-03-10 DIAGNOSIS — R102 Pelvic and perineal pain: Secondary | ICD-10-CM | POA: Insufficient documentation

## 2018-03-10 LAB — WET PREP, GENITAL
Clue Cells Wet Prep HPF POC: NONE SEEN
Trich, Wet Prep: NONE SEEN
Yeast Wet Prep HPF POC: NONE SEEN

## 2018-03-10 LAB — URINALYSIS, ROUTINE W REFLEX MICROSCOPIC
Bilirubin Urine: NEGATIVE
Glucose, UA: NEGATIVE mg/dL
Hgb urine dipstick: NEGATIVE
KETONES UR: NEGATIVE mg/dL
LEUKOCYTES UA: NEGATIVE
NITRITE: NEGATIVE
Protein, ur: NEGATIVE mg/dL
SPECIFIC GRAVITY, URINE: 1.014 (ref 1.005–1.030)
pH: 6 (ref 5.0–8.0)

## 2018-03-10 LAB — POC URINE PREG, ED: Preg Test, Ur: POSITIVE — AB

## 2018-03-10 LAB — GC/CHLAMYDIA PROBE AMP (~~LOC~~) NOT AT ARMC
CHLAMYDIA, DNA PROBE: NEGATIVE
NEISSERIA GONORRHEA: NEGATIVE

## 2018-03-10 LAB — RPR: RPR: NONREACTIVE

## 2018-03-10 LAB — HIV ANTIBODY (ROUTINE TESTING W REFLEX): HIV Screen 4th Generation wRfx: NONREACTIVE

## 2018-03-10 MED ORDER — TERCONAZOLE 0.8 % VA CREA: 1.0000 | TOPICAL_CREAM | Freq: Every day | VAGINAL | 0 refills | Status: DC

## 2018-03-10 MED ORDER — PREPLUS 27-1 MG PO TABS
1.0000 | ORAL_TABLET | Freq: Every day | ORAL | 13 refills | Status: DC
Start: 2018-03-10 — End: 2018-05-13

## 2018-03-10 MED ORDER — CEFTRIAXONE SODIUM 250 MG IJ SOLR
250.0000 mg | Freq: Once | INTRAMUSCULAR | Status: AC
  Administered 2018-03-10: 250 mg via INTRAMUSCULAR
  Filled 2018-03-10: qty 250

## 2018-03-10 MED ORDER — AZITHROMYCIN 250 MG PO TABS
1000.0000 mg | ORAL_TABLET | Freq: Once | ORAL | Status: AC
  Administered 2018-03-10: 1000 mg via ORAL
  Filled 2018-03-10: qty 4

## 2018-03-10 MED ORDER — LIDOCAINE HCL (PF) 1 % IJ SOLN
INTRAMUSCULAR | Status: AC
  Administered 2018-03-10: 0.9 mL
  Filled 2018-03-10: qty 5

## 2018-03-10 MED ORDER — ENSURE COMPLETE PO LIQD: 237.0000 mL | Freq: Two times a day (BID) | ORAL | 10 refills | Status: DC

## 2018-03-10 NOTE — Progress Notes (Signed)
G6P3 at 30 3/7 weeks [facility]/o painful vaginal [facility].  10/10.  No bleeding or leaking.  No reports of sexual intercourse.  No complaints of cxn pain.  Abdomen soft and nontender. Has regular scheduled appt with the Clinic today at 1355.  Dr Adrian BlackwaterStinson updated on pt arrival and complaints.  NST ordered. [facility] with reassuring NST.

## 2018-03-10 NOTE — ED Provider Notes (Signed)
[facility] EMERGENCY DEPARTMENT Provider Note   CSN: [id number] Arrival date & time: 03/10/18  4098     History   Chief Complaint Chief Complaint  Patient presents with  . Vaginal Discharge    HPI Yuritzi Kham is a 27 y.o. female.  The history is provided by the patient. No language interpreter was used.  Vaginal Discharge   Associated symptoms include abdominal pain. Pertinent negatives include no fever.     27 year old G5, P72 female presenting for evaluation of vaginal discharge.  Patient report for the past 2 days she has had vaginal discharge with vaginal irritation.  Symptoms moderate in severity.  She reports symptoms started shortly after she has sexual intercourse with her significant other.  No report of any fever nausea vomiting diarrhea constipation dysuria or vaginal bleeding.  Patient is 30 weeks and 2 days pregnant.  Her [facility]N is a [facility].  She denies any prior history of STI.  She describes her pain as sharp, non radiating and moderate.   Past Medical History:  Diagnosis Date  . Gallstones     Patient Active Problem List   Diagnosis Date Noted  . Drug use 02/26/2018  . Syncope 02/16/2018  . Obesity (BMI 35.0-39.9 without comorbidity) 12/01/2017  . Supervision of other normal pregnancy, antepartum 11/03/2017  . Previous cesarean delivery, antepartum 11/03/2017  . History of depression 11/03/2017  . Second degree burn of multiple fingers of right hand including thumb 10/09/2016  . Wound infection after surgery 02/28/2015  . Other known or suspected fetal abnormality, not elsewhere classified, affecting management of mother, antepartum condition or complication 02/05/2012    Past Surgical History:  Procedure Laterality Date  . CESAREAN SECTION     C/S x 3  . DILATION AND EVACUATION N/A 02/18/2017   Procedure: DILATATION AND EVACUATION;  Surgeon: Tereso Newcomer, MD;  Location: [facility];  Service: Gynecology;  Laterality: N/A;   . WISDOM TOOTH EXTRACTION       OB History    Gravida  6   Para  3   Term  3   Preterm      AB  2   Living  3     SAB  2   TAB      Ectopic      Multiple      Live Births  3            Home Medications    Prior to Admission medications   Medication Sig Start Date End Date Taking? Authorizing Provider  acetaminophen (TYLENOL) 325 MG tablet Take 650 mg by mouth every 6 (six) hours as needed for headache.    [provider]  butalbital-acetaminophen-caffeine (FIORICET, ESGIC) [phone] MG tablet  01/14/18   [provider]  Doxylamine-Pyridoxine (DICLEGIS) 10-10 MG TBEC Take 2 tabs at bedtime. If needed, add another tab in the morning. If needed, add another tab in the afternoon, up to 4 tabs/day. 01/19/18   Leftwich-Kirby, Wilmer Floor, CNM  feeding supplement, ENSURE COMPLETE, (ENSURE COMPLETE) LIQD Take 237 mLs by mouth 2 (two) times daily between meals. 01/19/18   Leftwich-Kirby, Wilmer Floor, CNM  metoCLOPramide (REGLAN) 10 MG tablet Take 1 tablet (10 mg total) by mouth every 8 (eight) hours as needed for nausea. 01/19/18   Leftwich-Kirby, Wilmer Floor, CNM  ondansetron (ZOFRAN) 4 MG tablet Take 1 tablet (4 mg total) by mouth every 8 (eight) hours as needed for nausea or vomiting. 01/19/18   Leftwich-Kirby, Wilmer Floor,  CNM  Prenatal Vit-Fe Fumarate-FA (PREPLUS) 27-1 MG TABS Take 1 tablet by mouth daily. 11/03/17   Sharyon Cableogers, Veronica C, CNM  promethazine (PHENERGAN) 25 MG suppository Place 1 suppository (25 mg total) rectally every 6 (six) hours as needed for nausea or vomiting. 01/19/18   Leftwich-Kirby, Wilmer FloorLisa A, CNM    Family History Family History  Problem Relation Age of Onset  . Cancer Other   . Cancer Maternal Grandmother        breast cancer    Social History Social History   Tobacco Use  . Smoking status: Never Smoker  . Smokeless tobacco: Never Used  Substance Use Topics  . Alcohol use: No  . Drug use: No     Allergies   Patient has no known  allergies.   Review of Systems Review of Systems  Constitutional: Negative for fever.  Gastrointestinal: Positive for abdominal pain.  Genitourinary: Positive for vaginal discharge.     Physical Exam Updated Vital Signs BP 127/69 (BP Location: Right Arm)   Pulse 80   Temp 98.9 F (37.2 C) (Oral)   Resp 16   LMP 08/05/2017 (LMP Unknown)   SpO2 100%   Physical Exam  Constitutional: She appears well-developed and well-nourished. No distress.  HENT:  Head: Atraumatic.  Eyes: Conjunctivae are normal.  Neck: Neck supple.  Cardiovascular: Normal rate and regular rhythm.  Pulmonary/Chest: Effort normal and breath sounds normal.  Abdominal: There is tenderness (Very mild suprapubic tenderness without guarding or rebound tenderness.).  Gravid abdomen  Genitourinary:  Genitourinary Comments: Chaperone present during exam.  No inguinal lymphadenopathy or inguinal hernia noted.  Normal external genitalia.  Discomfort with insertion of speculum.  Minimal vaginal discharge noted in vaginal vault.  Closed cervical os, no bleeding.  On bimanual examination, bilateral adnexal tenderness with cervical motion tenderness.  Neurological: She is alert.  Skin: No rash noted.  Psychiatric: She has a normal mood and affect.  Nursing note and vitals reviewed.    ED Treatments / Results  Labs (all labs ordered are listed, but only abnormal results are displayed) Labs Reviewed  WET PREP, GENITAL - Abnormal; Notable for the following components:      Result Value   WBC, Wet Prep HPF POC FEW (*)    All other components within normal limits  POC URINE PREG, ED - Abnormal; Notable for the following components:   Preg Test, Ur POSITIVE (*)    All other components within normal limits  URINALYSIS, ROUTINE W REFLEX MICROSCOPIC  RPR  HIV ANTIBODY (ROUTINE TESTING)  GC/CHLAMYDIA PROBE AMP ([place]) NOT AT [facility]    EKG None  Radiology [place] Pelvis Limited (transabdominal Only)  Result Date:  03/10/2018 CLINICAL DATA:  Thirty weeks pregnant. Pain. Rule out ovarian torsion EXAM: LIMITED ULTRASOUND OF PELVIS DOPPLER ULTRASOUND OF OVARIES TECHNIQUE: Limited transabdominal ultrasound examination of the pelvis was performed to evaluate the ovaries and adnexa regions only. Color and duplex Doppler ultrasound was utilized to evaluate blood flow to the ovaries. COMPARISON:  None. FINDINGS: Right ovary Measurements: Not visualized.  No adnexal mass seen. Left ovary Measurements: Not visualized.  No adnexal mass seen. Pulsed Doppler evaluation cannot be performed due to inability to localize and visualize ovaries. IMPRESSION: Neither ovary visualized.  No adnexal mass seen. Electronically Signed   By: Charlett NoseKevin  Dover M.D.   On: 03/10/2018 10:20   [place] Art/ven Flow Abd Pelv Doppler  Result Date: 03/10/2018 CLINICAL DATA:  Thirty weeks pregnant. Pain. Rule out ovarian torsion EXAM: LIMITED ULTRASOUND  OF PELVIS DOPPLER ULTRASOUND OF OVARIES TECHNIQUE: Limited transabdominal ultrasound examination of the pelvis was performed to evaluate the ovaries and adnexa regions only. Color and duplex Doppler ultrasound was utilized to evaluate blood flow to the ovaries. COMPARISON:  None. FINDINGS: Right ovary Measurements: Not visualized.  No adnexal mass seen. Left ovary Measurements: Not visualized.  No adnexal mass seen. Pulsed Doppler evaluation cannot be performed due to inability to localize and visualize ovaries. IMPRESSION: Neither ovary visualized.  No adnexal mass seen. Electronically Signed   By: Charlett NoseKevin  Dover M.D.   On: 03/10/2018 10:20    Procedures Procedures (including critical care time)  Medications Ordered in ED Medications  cefTRIAXone (ROCEPHIN) injection 250 mg (250 mg Intramuscular Given 03/10/18 0857)  azithromycin (ZITHROMAX) tablet 1,000 mg (1,000 mg Oral Given 03/10/18 0857)  lidocaine (PF) (XYLOCAINE) 1 % injection (0.9 mLs  Given 03/10/18 0857)     Initial Impression / Assessment and Plan /  ED Course  I have reviewed the triage vital signs and the nursing notes.  Pertinent labs & imaging results that were available during my care of the patient were reviewed by me and considered in my medical decision making (see chart for details).     BP 114/72   Pulse 99   Temp 98.9 F (37.2 C) (Oral)   Resp 16   LMP 08/05/2017 (LMP Unknown)   SpO2 97%    Final Clinical Impressions(s) / ED Diagnoses   Final diagnoses:  Vaginal discharge  Abdominal pain during pregnancy, third trimester    ED Discharge Orders    None     8:22 AM Patient is 30 weeks and 2 days pregnant who is here with vaginal discharge and lower pelvic pain after sexual intercourse 2 days prior.  She does have moderate pelvic discomfort on exam without significant vaginal discharge.  She is otherwise well-appearing.  [facility] nurse contacted.  Work-up initiated.  Will obtain ultrasound to rule out torsion.  12:09 PM [facility] nurse has seen and evaluated patient.  Patient/s fetus has normal fetal heart tone, no contraction.  Her wet prep is unremarkable, urine shows no signs of urinary tract infection, STD screening sent.  Patient was given Rocephin Zithromax prophylactically.  She is resting comfortably.  I did reach out and spoke with on-call OB/GYN, Dr. Ashok PallWouk who is aware of findings and felt patient is stable to follow-up outpatient as needed.  Return precautions discussed.  I have low suspicion for appendicitis or other acute abdominal pathology.   Fayrene Helperran, Rylee Nuzum, PA-C 03/10/18 1212    Jacalyn LefevreHaviland, Julie, MD 03/10/18 1357

## 2018-03-10 NOTE — Progress Notes (Signed)
Reassuring NST for 30 3/[redacted] weeks gestation pregnancy.  May D/C monitoring.  Dr Ashok PallWouk updated on pt status.  Informed of GC cultures and pending US.  [facility]  Please contact Dr Shonna ChockNoah Wouk at [phone] with results.

## 2018-03-10 NOTE — Patient Instructions (Signed)
Research childbirth classes and hospital preregistration at [email or web address]  Fetal Movement Counts Patient Name: ________________________________________________ Patient Due Date: ____________________ What is a fetal movement count? A fetal movement count is the number of times that you feel your baby move during a certain amount of time. This may also be called a fetal kick count. A fetal movement count is recommended for every pregnant woman. You may be asked to start counting fetal movements as early as week 28 of your pregnancy. Pay attention to when your baby is most active. You may notice your baby's sleep and wake cycles. You may also notice things that make your baby move more. You should do a fetal movement count:  When your baby is normally most active.  At the same time each day.  A good time to count movements is while you are resting, after having something to eat and drink. How do I count fetal movements? 1. Find a quiet, comfortable area. Sit, or lie down on your side. 2. Write down the date, the start time and stop time, and the number of movements that you felt between those two times. Take this information with you to your health care visits. 3. For 2 hours, count kicks, flutters, swishes, rolls, and jabs. You should feel at least 10 movements during 2 hours. 4. You may stop counting after you have felt 10 movements. 5. If you do not feel 10 movements in 2 hours, have something to eat and drink. Then, keep resting and counting for 1 hour. If you feel at least 4 movements during that hour, you may stop counting. Contact a health care provider if:  You feel fewer than 4 movements in 2 hours.  Your baby is not moving like he or she usually does. Date: ____________ Start time: ____________ Stop time: ____________ Movements: ____________ Date: ____________ Start time: ____________ Stop time: ____________ Movements: ____________ Date: ____________ Start time: ____________  Stop time: ____________ Movements: ____________ Date: ____________ Start time: ____________ Stop time: ____________ Movements: ____________ Date: ____________ Start time: ____________ Stop time: ____________ Movements: ____________ Date: ____________ Start time: ____________ Stop time: ____________ Movements: ____________ Date: ____________ Start time: ____________ Stop time: ____________ Movements: ____________ Date: ____________ Start time: ____________ Stop time: ____________ Movements: ____________ Date: ____________ Start time: ____________ Stop time: ____________ Movements: ____________ This information is not intended to replace advice given to you by your health care provider. Make sure you discuss any questions you have with your health care provider. Document Released: 08/13/2006 Document Revised: 03/12/2016 Document Reviewed: 08/23/2015 Elsevier Interactive Patient Education  2018 [employer]  Braxton Hicks Contractions Contractions of the uterus can occur throughout pregnancy, but they are not always a sign that you are in labor. You may have practice contractions called Braxton Hicks contractions. These false labor contractions are sometimes confused with true labor. What are Braxton Hicks contractions? Braxton Hicks contractions are tightening movements that occur in the muscles of the uterus before labor. Unlike true labor contractions, these contractions do not result in opening (dilation) and thinning of the cervix. Toward the end of pregnancy (32-34 weeks), Braxton Hicks contractions can happen more often and may become stronger. These contractions are sometimes difficult to tell apart from true labor because they can be very uncomfortable. You should not feel embarrassed if you go to the hospital with false labor. Sometimes, the only way to tell if you are in true labor is for your health care provider to look for changes in the cervix. The health care provider will   do a physical  exam and may monitor your contractions. If you are not in true labor, the exam should show that your cervix is not dilating and your water has not broken. If there are other health problems associated with your pregnancy, it is completely safe for you to be sent home with false labor. You may continue to have Braxton Hicks contractions until you go into true labor. How to tell the difference between true labor and false labor True labor  Contractions last 30-70 seconds.  Contractions become very regular.  Discomfort is usually felt in the top of the uterus, and it spreads to the lower abdomen and low back.  Contractions do not go away with walking.  Contractions usually become more intense and increase in frequency.  The cervix dilates and gets thinner. False labor  Contractions are usually shorter and not as strong as true labor contractions.  Contractions are usually irregular.  Contractions are often felt in the front of the lower abdomen and in the groin.  Contractions may go away when you walk around or change positions while lying down.  Contractions get weaker and are shorter-lasting as time goes on.  The cervix usually does not dilate or become thin. Follow these instructions at home:  Take over-the-counter and prescription medicines only as told by your health care provider.  Keep up with your usual exercises and follow other instructions from your health care provider.  Eat and drink lightly if you think you are going into labor.  If Braxton Hicks contractions are making you uncomfortable: ? Change your position from lying down or resting to walking, or change from walking to resting. ? Sit and rest in a tub of warm water. ? Drink enough fluid to keep your urine pale yellow. Dehydration may cause these contractions. ? Do slow and deep breathing several times an hour.  Keep all follow-up prenatal visits as told by your health care provider. This is  important. Contact a health care provider if:  You have a fever.  You have continuous pain in your abdomen. Get help right away if:  Your contractions become stronger, more regular, and closer together.  You have fluid leaking or gushing from your vagina.  You pass blood-tinged mucus (bloody show).  You have bleeding from your vagina.  You have low back pain that you never had before.  You feel your baby's head pushing down and causing pelvic pressure.  Your baby is not moving inside you as much as it used to. Summary  Contractions that occur before labor are called Braxton Hicks contractions, false labor, or practice contractions.  Braxton Hicks contractions are usually shorter, weaker, farther apart, and less regular than true labor contractions. True labor contractions usually become progressively stronger and regular and they become more frequent.  Manage discomfort from Braxton Hicks contractions by changing position, resting in a warm bath, drinking plenty of water, or practicing deep breathing. This information is not intended to replace advice given to you by your health care provider. Make sure you discuss any questions you have with your health care provider. Document Released: 11/27/2016 Document Revised: 11/27/2016 Document Reviewed: 11/27/2016 Elsevier Interactive Patient Education  2018 Elsevier Inc.    

## 2018-03-10 NOTE — Discharge Instructions (Signed)
Please call and follow up with your OBGYN or continue with your current appointment today.

## 2018-03-10 NOTE — Progress Notes (Signed)
PRENATAL VISIT NOTE  Subjective:  Julia Armstrong is a 27 y.o. [phone] at 2283w3d being seen today for ongoing prenatal care.  She is currently monitored for the following issues for this high-risk pregnancy and has Supervision of other normal pregnancy, antepartum; Previous cesarean delivery, antepartum; History of depression; Obesity (BMI 35.0-39.9 without comorbidity); Syncope; Other known or suspected fetal abnormality, not elsewhere classified, affecting management of mother, antepartum condition or complication; Second degree burn of multiple fingers of right hand including thumb; Wound infection after surgery; and Drug use on their problem list.  Patient reports vaginal irritation.  Contractions: Not present. Vag. Bleeding: None.  Movement: (!) Decreased. Denies leaking of fluid.  [facility] today without discharge. She had negative infection testing, but it appears she was treated presumptively for all possible infections including STDs. She also had NST which was documented as reassuring. Will not repeat NST again this afternoon for decreased FM since it was performed this morning with RROB. Recurrent syncopal episodes are improving, however still occur ~ 1x/week. Patient is seeing cardiology for this. 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   The following portions of the patient's history were reviewed and updated as appropriate: allergies, current medications, past family history, past medical history, past social history, past surgical history and problem list. Problem list updated.  Objective:   Vitals:   03/10/18 1438  BP: 119/78  Pulse: 100  Weight: 218 lb (98.9 kg)    Fetal Status: Fetal Heart Rate (bpm): 142 Fundal Height: 36 cm Movement: (!) Decreased     General:  Alert, oriented and cooperative. Patient is in no acute distress.   Skin: Skin is warm and dry. No rash noted.   Cardiovascular: Normal heart rate noted  Respiratory: Normal respiratory effort, no problems with respiration noted  Abdomen: Soft, gravid, appropriate for gestational age.  Pain/Pressure: Present     Pelvic: Cervical exam deferred        Extremities: Normal range of motion.  Edema: Trace  Mental Status: Normal mood and affect. Normal behavior. Normal judgment and thought content.   Assessment and Plan:  Pregnancy: [id number] at 2283w3d  1. Supervision of other normal pregnancy, antepartum - Prenatal Vit-Fe Fumarate-FA (PREPLUS) 27-1 MG TABS; Take 1 tablet by mouth daily.  Dispense: 30 tablet; Refill: 13  2. Previous cesarean delivery, antepartum - x3 - Plan repeat, in-basket message sent to schedule   3. Obesity (BMI 35.0-39.9 without comorbidity) - US MFM OB FOLLOW UP; scheduled  4. Syncope, unspecified syncope type - Improving per patient, continue work-up with Cariology   5. Poor weight gain of pregnancy, second trimester - feeding supplement, ENSURE COMPLETE, (ENSURE COMPLETE) LIQD; Take 237 mLs by mouth 2 (two) times daily between meals.  Dispense: 50 Bottle; Refill: 10  6. Significant discrepancy between uterine size and clinical dates, antepartum, third trimester - Measurements today at 36 cm - Last US was 12/22/17 and EFW was normal - US MFM OB FOLLOW UP; scheduled for growth  7. Vagina itching - terconazole (TERAZOL 3) 0.8 % vaginal cream; Place 1 applicator vaginally at bedtime.  Dispense: 20 g; Refill: 0  Preterm labor symptoms and general obstetric precautions including but not limited to vaginal bleeding, contractions, leaking of fluid and fetal movement were reviewed in detail with the patient. Please refer to After Visit Summary for other counseling recommendations.  [facility] about 2 weeks (around 03/24/2018) [facility]  Future Appointments  Date Time Provider Department Center  03/12/2018  1:15 PM [facility] US 4 [facility]  [facility]  03/19/2018  2:00 PM Azalee CourseMeng, Hao, [place] [facility]    Vonzella NippleJulie Ysabel Cowgill, [place]-C

## 2018-03-10 NOTE — ED Notes (Signed)
Notified [facility] to see pt

## 2018-03-10 NOTE — ED Notes (Signed)
Pt is [redacted] weeks pregnant.

## 2018-03-10 NOTE — ED Notes (Signed)
Ob rapid response at bedside. 

## 2018-03-10 NOTE — ED Notes (Signed)
[facility] at bedside 

## 2018-03-10 NOTE — ED Notes (Signed)
Patient transported to Ultrasound 

## 2018-03-10 NOTE — ED Triage Notes (Signed)
Pt to ER for evaluation of vaginal discharge, states is white, x1 day with pain with urination. Pt in NAD.

## 2018-03-11 DIAGNOSIS — Z029 Encounter for administrative examinations, unspecified: Secondary | ICD-10-CM

## 2018-03-12 ENCOUNTER — Encounter (HOSPITAL_COMMUNITY): Payer: Self-pay

## 2018-03-12 ENCOUNTER — Ambulatory Visit (HOSPITAL_COMMUNITY)
Admission: RE | Admit: 2018-03-12 | Discharge: 2018-03-12 | Disposition: A | Discharge status: Home / Self Care | Payer: BLUE CROSS/BLUE SHIELD | Source: Ambulatory Visit | Attending: Medical | Admitting: Medical

## 2018-03-12 DIAGNOSIS — O26843 Uterine size-date discrepancy, third trimester: Secondary | ICD-10-CM | POA: Insufficient documentation

## 2018-03-12 DIAGNOSIS — R55 Syncope and collapse: Secondary | ICD-10-CM

## 2018-03-12 DIAGNOSIS — O34219 Maternal care for unspecified type scar from previous cesarean delivery: Secondary | ICD-10-CM | POA: Insufficient documentation

## 2018-03-12 DIAGNOSIS — O99343 Other mental disorders complicating pregnancy, third trimester: Secondary | ICD-10-CM

## 2018-03-12 DIAGNOSIS — O99213 Obesity complicating pregnancy, third trimester: Secondary | ICD-10-CM | POA: Insufficient documentation

## 2018-03-12 DIAGNOSIS — Z3A30 30 weeks gestation of pregnancy: Secondary | ICD-10-CM | POA: Insufficient documentation

## 2018-03-12 DIAGNOSIS — O99212 Obesity complicating pregnancy, second trimester: Secondary | ICD-10-CM

## 2018-03-12 DIAGNOSIS — E669 Obesity, unspecified: Secondary | ICD-10-CM

## 2018-03-12 DIAGNOSIS — O99340 Other mental disorders complicating pregnancy, unspecified trimester: Secondary | ICD-10-CM | POA: Insufficient documentation

## 2018-03-12 DIAGNOSIS — Z8659 Personal history of other mental and behavioral disorders: Secondary | ICD-10-CM

## 2018-03-18 ENCOUNTER — Encounter (HOSPITAL_COMMUNITY): Payer: Self-pay | Admitting: *Deleted

## 2018-03-18 ENCOUNTER — Inpatient Hospital Stay (HOSPITAL_COMMUNITY)
Admission: AD | Admit: 2018-03-18 | Discharge: 2018-03-19 | Disposition: A | Discharge status: Home / Self Care | Payer: Medicaid Other | Source: Ambulatory Visit | Attending: Obstetrics and Gynecology | Admitting: Obstetrics and Gynecology

## 2018-03-18 ENCOUNTER — Inpatient Hospital Stay (HOSPITAL_COMMUNITY): Payer: Medicaid Other

## 2018-03-18 DIAGNOSIS — O99353 Diseases of the nervous system complicating pregnancy, third trimester: Secondary | ICD-10-CM | POA: Insufficient documentation

## 2018-03-18 DIAGNOSIS — G8918 Other acute postprocedural pain: Secondary | ICD-10-CM

## 2018-03-18 DIAGNOSIS — O9A213 Injury, poisoning and certain other consequences of external causes complicating pregnancy, third trimester: Secondary | ICD-10-CM | POA: Diagnosis not present

## 2018-03-18 DIAGNOSIS — Z79899 Other long term (current) drug therapy: Secondary | ICD-10-CM | POA: Diagnosis not present

## 2018-03-18 DIAGNOSIS — R51 Headache: Secondary | ICD-10-CM | POA: Insufficient documentation

## 2018-03-18 DIAGNOSIS — T148XXA Other injury of unspecified body region, initial encounter: Secondary | ICD-10-CM | POA: Diagnosis not present

## 2018-03-18 DIAGNOSIS — O26893 Other specified pregnancy related conditions, third trimester: Secondary | ICD-10-CM

## 2018-03-18 DIAGNOSIS — T7491XA Unspecified adult maltreatment, confirmed, initial encounter: Secondary | ICD-10-CM

## 2018-03-18 DIAGNOSIS — R1310 Dysphagia, unspecified: Secondary | ICD-10-CM | POA: Insufficient documentation

## 2018-03-18 DIAGNOSIS — M545 Low back pain: Secondary | ICD-10-CM

## 2018-03-18 DIAGNOSIS — N898 Other specified noninflammatory disorders of vagina: Secondary | ICD-10-CM | POA: Insufficient documentation

## 2018-03-18 DIAGNOSIS — R109 Unspecified abdominal pain: Secondary | ICD-10-CM | POA: Diagnosis not present

## 2018-03-18 DIAGNOSIS — Z3A31 31 weeks gestation of pregnancy: Secondary | ICD-10-CM | POA: Insufficient documentation

## 2018-03-18 DIAGNOSIS — T07XXXA Unspecified multiple injuries, initial encounter: Secondary | ICD-10-CM

## 2018-03-18 LAB — URINALYSIS, ROUTINE W REFLEX MICROSCOPIC
BILIRUBIN URINE: NEGATIVE
Bacteria, UA: NONE SEEN
GLUCOSE, UA: NEGATIVE mg/dL
HGB URINE DIPSTICK: NEGATIVE
KETONES UR: NEGATIVE mg/dL
Leukocytes, UA: NEGATIVE
Nitrite: NEGATIVE
PH: 6 (ref 5.0–8.0)
PROTEIN: 30 mg/dL — AB
Specific Gravity, Urine: 1.023 (ref 1.005–1.030)

## 2018-03-18 MED ORDER — NIFEDIPINE 10 MG PO CAPS
10.0000 mg | ORAL_CAPSULE | ORAL | Status: DC | PRN
  Administered 2018-03-18: 10 mg via ORAL
  Filled 2018-03-18: qty 1

## 2018-03-18 MED ORDER — BUTALBITAL-APAP-CAFFEINE 50-325-40 MG PO TABS
2.0000 | ORAL_TABLET | Freq: Once | ORAL | Status: AC
  Administered 2018-03-18: 2 via ORAL
  Filled 2018-03-18: qty 2
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
## 2018-03-18 NOTE — MAU Note (Signed)
Urine in lab 

## 2018-03-19 ENCOUNTER — Ambulatory Visit: Payer: Self-pay | Admitting: Physician Assistant
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
## 2018-03-22 ENCOUNTER — Encounter: Payer: Self-pay | Admitting: *Deleted

## 2018-03-26 ENCOUNTER — Encounter: Payer: Self-pay | Admitting: Obstetrics & Gynecology

## 2018-04-06 ENCOUNTER — Encounter: Payer: Self-pay | Admitting: Student

## 2018-04-09 ENCOUNTER — Encounter: Payer: Self-pay | Admitting: Obstetrics & Gynecology

## 2018-04-20 ENCOUNTER — Encounter: Payer: Self-pay | Admitting: Student

## 2018-04-21 ENCOUNTER — Encounter: Payer: Self-pay | Admitting: Obstetrics & Gynecology

## 2018-04-23 ENCOUNTER — Encounter (HOSPITAL_COMMUNITY): Payer: Self-pay

## 2018-04-30 NOTE — Patient Instructions (Signed)
Julia Armstrong  04/30/2018   Your procedure is scheduled on:  05/10/2018  Enter through the Main Entrance of [facility] at 1145 AM.  Pick up the phone at the desk and dial [phone]  Call this number if you have problems the morning of surgery:[phone]  Remember:   Do not eat food:(After Midnight) Desps de medianoche.  Do not drink clear liquids: (6 Hours before arrival) 6 horas ante llegada.  Take these medicines the morning of surgery with A SIP OF WATER: none   Do not wear jewelry, make-up or nail polish.  Do not wear lotions, powders, or perfumes. Do not wear deodorant.  Do not shave 48 hours prior to surgery.  Do not bring valuables to the hospital.  [facility] is not   responsible for any belongings or valuables brought to the hospital.  Contacts, dentures or bridgework may not be worn into surgery.  Leave suitcase in the car. After surgery it may be brought to your room.  For patients admitted to the hospital, checkout time is 11:00 AM the day of              discharge.    N/A   Please read over the following fact sheets that you were given:   Surgical Site Infection Prevention

## 2018-05-03 ENCOUNTER — Encounter: Payer: Self-pay | Admitting: Obstetrics & Gynecology

## 2018-05-07 ENCOUNTER — Encounter (HOSPITAL_COMMUNITY)
Admission: RE | Admit: 2018-05-07 | Discharge: 2018-05-07 | Disposition: A | Discharge status: Home / Self Care | Payer: Medicaid Other | Source: Ambulatory Visit | Attending: Obstetrics and Gynecology | Admitting: Obstetrics and Gynecology

## 2018-05-07 ENCOUNTER — Other Ambulatory Visit: Payer: Self-pay | Admitting: Obstetrics and Gynecology

## 2018-05-07 LAB — CBC
HCT: 34.9 % — ABNORMAL LOW (ref 36.0–46.0)
Hemoglobin: 11.3 g/dL — ABNORMAL LOW (ref 12.0–15.0)
MCH: 24.7 pg — AB (ref 26.0–34.0)
MCHC: 32.4 g/dL (ref 30.0–36.0)
MCV: 76.4 fL — ABNORMAL LOW (ref 80.0–100.0)
NRBC: 0 % (ref 0.0–0.2)
PLATELETS: 162 10*3/uL (ref 150–400)
RBC: 4.57 MIL/uL (ref 3.87–5.11)
RDW: 15.5 % (ref 11.5–15.5)
WBC: 7.2 10*3/uL (ref 4.0–10.5)

## 2018-05-08 LAB — RPR: RPR: NONREACTIVE

## 2018-05-09 NOTE — Anesthesia Preprocedure Evaluation (Addendum)
Anesthesia Evaluation  Patient identified by MRN, date of birth, ID band Patient awake    Reviewed: Allergy & Precautions, NPO status , Patient's Chart, lab work & pertinent test results  History of Anesthesia Complications Negative for: history of anesthetic complications  Airway Mallampati: II  TM Distance: >3 FB Neck ROM: Full    Dental no notable dental hx. (+) Teeth Intact   Pulmonary neg pulmonary ROS,    Pulmonary exam normal breath sounds clear to auscultation       Cardiovascular negative cardio ROS Normal cardiovascular exam Rhythm:Regular Rate:Normal     Neuro/Psych negative neurological ROS  negative psych ROS   GI/Hepatic negative GI ROS, Neg liver ROS,   Endo/Other  negative endocrine ROS  Renal/GU negative Renal ROS  negative genitourinary   Musculoskeletal negative musculoskeletal ROS (+)   Abdominal   Peds negative pediatric ROS (+)  Hematology negative hematology ROS (+)   Anesthesia Other Findings Hx of undifferentiated syncopal episodes during pregnancy, last 1 month ago  Reproductive/Obstetrics (+) Pregnancy Hx of C/S x3                            Anesthesia Physical Anesthesia Plan  ASA: II  Anesthesia Plan: Combined Spinal and Epidural   Post-op Pain Management:    Induction:   PONV Risk Score and Plan: 2 and Ondansetron, Dexamethasone and Treatment may vary due to age or medical condition  Airway Management Planned: Natural Airway  Additional Equipment:   Intra-op Plan:   Post-operative Plan:   Informed Consent: I have reviewed the patients History and Physical, chart, labs and discussed the procedure including the risks, benefits and alternatives for the proposed anesthesia with the patient or authorized representative who has indicated his/her understanding and acceptance.     Plan Discussed with: CRNA and Surgeon  Anesthesia Plan Comments:        Anesthesia Quick Evaluation

## 2018-05-10 ENCOUNTER — Encounter: Payer: Self-pay | Admitting: Obstetrics and Gynecology

## 2018-05-10 ENCOUNTER — Inpatient Hospital Stay (HOSPITAL_COMMUNITY)
Admission: RE | Admit: 2018-05-10 | Discharge: 2018-05-13 | DRG: 787 | Disposition: A | Discharge status: Home / Self Care | Payer: Medicaid Other | Attending: Obstetrics and Gynecology | Admitting: Obstetrics and Gynecology

## 2018-05-10 ENCOUNTER — Encounter (HOSPITAL_COMMUNITY)
Admission: RE | Disposition: A | Discharge status: Home / Self Care | Payer: Self-pay | Source: Home / Self Care | Attending: Obstetrics and Gynecology

## 2018-05-10 ENCOUNTER — Inpatient Hospital Stay (HOSPITAL_COMMUNITY): Payer: Medicaid Other | Admitting: Anesthesiology

## 2018-05-10 ENCOUNTER — Other Ambulatory Visit: Payer: Self-pay | Admitting: Obstetrics and Gynecology

## 2018-05-10 ENCOUNTER — Encounter (HOSPITAL_COMMUNITY): Payer: Self-pay | Admitting: Obstetrics and Gynecology

## 2018-05-10 DIAGNOSIS — F1290 Cannabis use, unspecified, uncomplicated: Secondary | ICD-10-CM | POA: Diagnosis present

## 2018-05-10 DIAGNOSIS — O34211 Maternal care for low transverse scar from previous cesarean delivery: Secondary | ICD-10-CM | POA: Diagnosis present

## 2018-05-10 DIAGNOSIS — Z3480 Encounter for supervision of other normal pregnancy, unspecified trimester: Secondary | ICD-10-CM

## 2018-05-10 DIAGNOSIS — E669 Obesity, unspecified: Secondary | ICD-10-CM | POA: Diagnosis present

## 2018-05-10 DIAGNOSIS — O99214 Obesity complicating childbirth: Secondary | ICD-10-CM | POA: Diagnosis present

## 2018-05-10 DIAGNOSIS — Z3A39 39 weeks gestation of pregnancy: Secondary | ICD-10-CM

## 2018-05-10 DIAGNOSIS — O99324 Drug use complicating childbirth: Secondary | ICD-10-CM | POA: Diagnosis present

## 2018-05-10 DIAGNOSIS — O361990 Maternal care for other isoimmunization, unspecified trimester, not applicable or unspecified: Secondary | ICD-10-CM | POA: Insufficient documentation

## 2018-05-10 DIAGNOSIS — Z98891 History of uterine scar from previous surgery: Secondary | ICD-10-CM

## 2018-05-10 HISTORY — DX: Maternal care for other isoimmunization, unspecified trimester, not applicable or unspecified: O36.1990

## 2018-05-10 HISTORY — DX: Infection following a procedure, other surgical site, initial encounter: T81.49XA

## 2018-05-10 LAB — RAPID URINE DRUG SCREEN, HOSP PERFORMED
Amphetamines: NOT DETECTED
BENZODIAZEPINES: NOT DETECTED
Barbiturates: NOT DETECTED
COCAINE: NOT DETECTED
OPIATES: NOT DETECTED
Tetrahydrocannabinol: POSITIVE — AB

## 2018-05-10 SURGERY — Surgical Case
Anesthesia: Regional

## 2018-05-10 MED ORDER — ZOLPIDEM TARTRATE 5 MG PO TABS: 5.0000 mg | ORAL_TABLET | Freq: Every evening | ORAL | Status: DC | PRN

## 2018-05-10 MED ORDER — OXYCODONE HCL 5 MG PO TABS
10.0000 mg | ORAL_TABLET | ORAL | Status: DC | PRN
  Administered 2018-05-12 – 2018-05-13 (×5): 10 mg via ORAL
  Filled 2018-05-10 (×5): qty 2

## 2018-05-10 MED ORDER — ONDANSETRON HCL 4 MG/2ML IJ SOLN
4.0000 mg | Freq: Three times a day (TID) | INTRAMUSCULAR | Status: DC | PRN
  Administered 2018-05-10: 4 mg via INTRAVENOUS
  Filled 2018-05-10: qty 2

## 2018-05-10 MED ORDER — OXYCODONE HCL 5 MG PO TABS
5.0000 mg | ORAL_TABLET | ORAL | Status: DC | PRN
  Administered 2018-05-11 (×3): 5 mg via ORAL
  Filled 2018-05-10 (×3): qty 1

## 2018-05-10 MED ORDER — MENTHOL 3 MG MT LOZG: 1.0000 | LOZENGE | OROMUCOSAL | Status: DC | PRN

## 2018-05-10 MED ORDER — WITCH HAZEL-GLYCERIN EX PADS: 1.0000 "application " | MEDICATED_PAD | CUTANEOUS | Status: DC | PRN

## 2018-05-10 MED ORDER — LACTATED RINGERS IV SOLN
INTRAVENOUS | Status: DC | PRN
  Administered 2018-05-10 (×3): via INTRAVENOUS

## 2018-05-10 MED ORDER — CEFAZOLIN SODIUM-DEXTROSE 2-4 GM/100ML-% IV SOLN
2.0000 g | INTRAVENOUS | Status: AC
  Administered 2018-05-10: 2 g via INTRAVENOUS

## 2018-05-10 MED ORDER — LACTATED RINGERS IV SOLN: INTRAVENOUS | Status: DC | PRN

## 2018-05-10 MED ORDER — BUPIVACAINE IN DEXTROSE 0.75-8.25 % IT SOLN
INTRATHECAL | Status: DC | PRN
  Administered 2018-05-10: 1.8 mL via INTRATHECAL

## 2018-05-10 MED ORDER — SIMETHICONE 80 MG PO CHEW: 80.0000 mg | CHEWABLE_TABLET | ORAL | Status: DC | PRN

## 2018-05-10 MED ORDER — LACTATED RINGERS IV SOLN
INTRAVENOUS | Status: DC | PRN
  Administered 2018-05-10: 14:00:00 via INTRAVENOUS

## 2018-05-10 MED ORDER — DEXAMETHASONE SODIUM PHOSPHATE 4 MG/ML IJ SOLN
INTRAMUSCULAR | Status: AC
  Filled 2018-05-10: qty 1

## 2018-05-10 MED ORDER — DEXAMETHASONE SODIUM PHOSPHATE 4 MG/ML IJ SOLN
INTRAMUSCULAR | Status: DC | PRN
  Administered 2018-05-10: 4 mg via INTRAVENOUS

## 2018-05-10 MED ORDER — LACTATED RINGERS IV SOLN: INTRAVENOUS | Status: DC

## 2018-05-10 MED ORDER — ACETAMINOPHEN 325 MG PO TABS
650.0000 mg | ORAL_TABLET | ORAL | Status: DC | PRN
  Administered 2018-05-11 – 2018-05-13 (×2): 650 mg via ORAL
  Filled 2018-05-10 (×2): qty 2

## 2018-05-10 MED ORDER — DIPHENHYDRAMINE HCL 25 MG PO CAPS: 25.0000 mg | ORAL_CAPSULE | Freq: Four times a day (QID) | ORAL | Status: DC | PRN

## 2018-05-10 MED ORDER — TETANUS-DIPHTH-ACELL PERTUSSIS 5-2.5-18.5 LF-MCG/0.5 IM SUSP: 0.5000 mL | Freq: Once | INTRAMUSCULAR | Status: DC

## 2018-05-10 MED ORDER — MEASLES, MUMPS & RUBELLA VAC ~~LOC~~ INJ: 0.5000 mL | INJECTION | Freq: Once | SUBCUTANEOUS | Status: DC

## 2018-05-10 MED ORDER — NALBUPHINE HCL 10 MG/ML IJ SOLN: 5.0000 mg | INTRAMUSCULAR | Status: DC | PRN

## 2018-05-10 MED ORDER — PHENYLEPHRINE 8 MG IN D5W 100 ML (0.08MG/ML) PREMIX OPTIME
INJECTION | INTRAVENOUS | Status: DC | PRN
  Administered 2018-05-10: 60 ug/min via INTRAVENOUS

## 2018-05-10 MED ORDER — MORPHINE SULFATE (PF) 0.5 MG/ML IJ SOLN
INTRAMUSCULAR | Status: DC | PRN
  Administered 2018-05-10: .15 mg via INTRATHECAL

## 2018-05-10 MED ORDER — NALOXONE HCL 0.4 MG/ML IJ SOLN: 0.4000 mg | INTRAMUSCULAR | Status: DC | PRN

## 2018-05-10 MED ORDER — NALBUPHINE HCL 10 MG/ML IJ SOLN
INTRAMUSCULAR | Status: AC
  Filled 2018-05-10: qty 1

## 2018-05-10 MED ORDER — SENNOSIDES-DOCUSATE SODIUM 8.6-50 MG PO TABS
2.0000 | ORAL_TABLET | ORAL | Status: DC
  Administered 2018-05-11 – 2018-05-12 (×3): 2 via ORAL
  Filled 2018-05-10 (×3): qty 2

## 2018-05-10 MED ORDER — ONDANSETRON HCL 4 MG/2ML IJ SOLN
INTRAMUSCULAR | Status: AC
  Filled 2018-05-10: qty 2

## 2018-05-10 MED ORDER — SIMETHICONE 80 MG PO CHEW
80.0000 mg | CHEWABLE_TABLET | ORAL | Status: DC
  Administered 2018-05-11 – 2018-05-12 (×3): 80 mg via ORAL
  Filled 2018-05-10 (×4): qty 1

## 2018-05-10 MED ORDER — IBUPROFEN 600 MG PO TABS
600.0000 mg | ORAL_TABLET | Freq: Four times a day (QID) | ORAL | Status: DC
  Administered 2018-05-11 – 2018-05-13 (×11): 600 mg via ORAL
  Filled 2018-05-10 (×11): qty 1

## 2018-05-10 MED ORDER — KETOROLAC TROMETHAMINE 30 MG/ML IJ SOLN
30.0000 mg | Freq: Once | INTRAMUSCULAR | Status: AC | PRN
  Administered 2018-05-10: 30 mg via INTRAVENOUS

## 2018-05-10 MED ORDER — PRENATAL MULTIVITAMIN CH
1.0000 | ORAL_TABLET | Freq: Every day | ORAL | Status: DC
  Administered 2018-05-11 – 2018-05-13 (×3): 1 via ORAL
  Filled 2018-05-10 (×3): qty 1

## 2018-05-10 MED ORDER — DIBUCAINE 1 % RE OINT: 1.0000 "application " | TOPICAL_OINTMENT | RECTAL | Status: DC | PRN

## 2018-05-10 MED ORDER — SODIUM CHLORIDE 0.9% FLUSH: 3.0000 mL | INTRAVENOUS | Status: DC | PRN

## 2018-05-10 MED ORDER — MORPHINE SULFATE (PF) 0.5 MG/ML IJ SOLN
INTRAMUSCULAR | Status: AC
  Filled 2018-05-10: qty 10

## 2018-05-10 MED ORDER — NALOXONE HCL 4 MG/10ML IJ SOLN: 1.0000 ug/kg/h | INTRAVENOUS | Status: DC | PRN

## 2018-05-10 MED ORDER — ONDANSETRON HCL 4 MG/2ML IJ SOLN
INTRAMUSCULAR | Status: DC | PRN
  Administered 2018-05-10: 4 mg via INTRAVENOUS

## 2018-05-10 MED ORDER — PROMETHAZINE HCL 25 MG/ML IJ SOLN: 6.2500 mg | INTRAMUSCULAR | Status: DC | PRN

## 2018-05-10 MED ORDER — ENOXAPARIN SODIUM 60 MG/0.6ML ~~LOC~~ SOLN
50.0000 mg | SUBCUTANEOUS | Status: DC
  Filled 2018-05-10 (×3): qty 0.6

## 2018-05-10 MED ORDER — NALBUPHINE HCL 10 MG/ML IJ SOLN: 5.0000 mg | Freq: Once | INTRAMUSCULAR | Status: AC | PRN

## 2018-05-10 MED ORDER — SIMETHICONE 80 MG PO CHEW
80.0000 mg | CHEWABLE_TABLET | Freq: Three times a day (TID) | ORAL | Status: DC
  Administered 2018-05-11 – 2018-05-13 (×5): 80 mg via ORAL
  Filled 2018-05-10 (×5): qty 1

## 2018-05-10 MED ORDER — FENTANYL CITRATE (PF) 100 MCG/2ML IJ SOLN: 25.0000 ug | INTRAMUSCULAR | Status: DC | PRN

## 2018-05-10 MED ORDER — FENTANYL CITRATE (PF) 100 MCG/2ML IJ SOLN
INTRAMUSCULAR | Status: AC
  Filled 2018-05-10: qty 2

## 2018-05-10 MED ORDER — FENTANYL CITRATE (PF) 100 MCG/2ML IJ SOLN
INTRAMUSCULAR | Status: DC | PRN
  Administered 2018-05-10: 15 ug via INTRATHECAL

## 2018-05-10 MED ORDER — SODIUM CHLORIDE 0.9 % IR SOLN
Status: DC | PRN
  Administered 2018-05-10: 1

## 2018-05-10 MED ORDER — KETOROLAC TROMETHAMINE 30 MG/ML IJ SOLN
INTRAMUSCULAR | Status: AC
  Filled 2018-05-10: qty 1

## 2018-05-10 MED ORDER — OXYTOCIN 40 UNITS IN LACTATED RINGERS INFUSION - SIMPLE MED
2.5000 [IU]/h | INTRAVENOUS | Status: AC
  Administered 2018-05-10: 2.5 [IU]/h via INTRAVENOUS
  Filled 2018-05-10: qty 1000

## 2018-05-10 MED ORDER — SODIUM CHLORIDE 0.9% IV SOLUTION: Freq: Once | INTRAVENOUS | Status: DC

## 2018-05-10 MED ORDER — OXYTOCIN 10 UNIT/ML IJ SOLN
INTRAMUSCULAR | Status: AC
  Filled 2018-05-10: qty 4

## 2018-05-10 MED ORDER — SOD CITRATE-CITRIC ACID 500-334 MG/5ML PO SOLN
30.0000 mL | ORAL | Status: AC
  Administered 2018-05-10: 30 mL via ORAL

## 2018-05-10 MED ORDER — COCONUT OIL OIL: 1.0000 "application " | TOPICAL_OIL | Status: DC | PRN

## 2018-05-10 MED ORDER — OXYTOCIN 10 UNIT/ML IJ SOLN
INTRAVENOUS | Status: DC | PRN
  Administered 2018-05-10: 40 [IU] via INTRAVENOUS

## 2018-05-10 MED ORDER — NALBUPHINE HCL 10 MG/ML IJ SOLN
5.0000 mg | Freq: Once | INTRAMUSCULAR | Status: AC | PRN
  Administered 2018-05-10: 5 mg via SUBCUTANEOUS

## 2018-05-10 MED ORDER — SOD CITRATE-CITRIC ACID 500-334 MG/5ML PO SOLN
ORAL | Status: AC
  Administered 2018-05-10: 30 mL via ORAL
  Filled 2018-05-10: qty 15

## 2018-05-10 SURGICAL SUPPLY — 38 items
BENZOIN TINCTURE PRP APPL 2/3 (GAUZE/BANDAGES/DRESSINGS) ×2 IMPLANT
CANISTER SUCT 3000ML PPV (MISCELLANEOUS) ×2 IMPLANT
CHLORAPREP W/TINT 26ML (MISCELLANEOUS) ×2 IMPLANT
DRESSING PREVENA PLUS CUSTOM (GAUZE/BANDAGES/DRESSINGS) ×1 IMPLANT
DRSG OPSITE POSTOP 4X10 (GAUZE/BANDAGES/DRESSINGS) ×2 IMPLANT
DRSG PREVENA PLUS CUSTOM (GAUZE/BANDAGES/DRESSINGS) ×2
ELECT REM PT RETURN 9FT ADLT (ELECTROSURGICAL) ×2
ELECTRODE REM PT RTRN 9FT ADLT (ELECTROSURGICAL) ×1 IMPLANT
EXTRACTOR VACUUM KIWI (MISCELLANEOUS) ×2 IMPLANT
GLOVE BIOGEL PI IND STRL 7.0 (GLOVE) ×2 IMPLANT
GLOVE BIOGEL PI IND STRL 7.5 (GLOVE) ×1 IMPLANT
GLOVE BIOGEL PI INDICATOR 7.0 (GLOVE) ×2
GLOVE BIOGEL PI INDICATOR 7.5 (GLOVE) ×1
GLOVE SKINSENSE NS SZ7.0 (GLOVE) ×1
GLOVE SKINSENSE STRL SZ7.0 (GLOVE) ×1 IMPLANT
GOWN STRL REUS W/ TWL LRG LVL3 (GOWN DISPOSABLE) ×2 IMPLANT
GOWN STRL REUS W/ TWL XL LVL3 (GOWN DISPOSABLE) ×1 IMPLANT
GOWN STRL REUS W/TWL LRG LVL3 (GOWN DISPOSABLE) ×2
GOWN STRL REUS W/TWL XL LVL3 (GOWN DISPOSABLE) ×1
KIT PREVENA INCISION MGT20CM45 (CANNISTER) ×2 IMPLANT
NS IRRIG 1000ML POUR BTL (IV SOLUTION) ×2 IMPLANT
PACK C SECTION WH (CUSTOM PROCEDURE TRAY) ×2 IMPLANT
PAD ABD 7.5X8 STRL (GAUZE/BANDAGES/DRESSINGS) ×2 IMPLANT
PAD OB MATERNITY 4.3X12.25 (PERSONAL CARE ITEMS) ×2 IMPLANT
PAD PREP 24X48 CUFFED NSTRL (MISCELLANEOUS) ×2 IMPLANT
PENCIL SMOKE EVAC W/HOLSTER (ELECTROSURGICAL) ×2 IMPLANT
RTRCTR C-SECT PINK 25CM LRG (MISCELLANEOUS) ×2 IMPLANT
SPONGE LAP 18X18 RF (DISPOSABLE) ×8 IMPLANT
STRIP CLOSURE SKIN 1/2X4 (GAUZE/BANDAGES/DRESSINGS) ×2 IMPLANT
SUT CHROMIC 1 CTX 36 (SUTURE) ×2 IMPLANT
SUT MNCRL 0 VIOLET CTX 36 (SUTURE) ×2 IMPLANT
SUT MON AB 4-0 PS1 27 (SUTURE) ×2 IMPLANT
SUT MONOCRYL 0 CTX 36 (SUTURE) ×2
SUT PLAIN 2 0 XLH (SUTURE) ×2 IMPLANT
SUT VIC AB 0 CT1 36 (SUTURE) ×4 IMPLANT
SUT VIC AB 3-0 CT1 27 (SUTURE) ×1
SUT VIC AB 3-0 CT1 TAPERPNT 27 (SUTURE) ×1 IMPLANT
TOWEL OR 17X24 6PK STRL BLUE (TOWEL DISPOSABLE) ×4 IMPLANT

## 2018-05-10 NOTE — Anesthesia Postprocedure Evaluation (Signed)
Anesthesia Post Note  Patient: Julia Armstrong  Procedure(s) Performed: REPEAT CESAREAN SECTION (N/A )     Patient location during evaluation: PACU Anesthesia Type: Combined Spinal/Epidural Level of consciousness: awake and alert Pain management: pain level controlled Vital Signs Assessment: post-procedure vital signs reviewed and stable Respiratory status: spontaneous breathing, nonlabored ventilation and respiratory function stable Cardiovascular status: blood pressure returned to baseline and stable Postop Assessment: no apparent nausea or vomiting and spinal receding Anesthetic complications: no Comments: Epidural removed at end of procedure. Tip intact.    Last Vitals:  Vitals:   05/10/18 1532 05/10/18 1533  BP:  (!) 119/56  Pulse: 65 68  Resp: 13 13  Temp:    SpO2: 100% 100%    Last Pain:  Vitals:   05/10/18 1532  TempSrc:   PainSc: 0-No pain   Pain Goal:                 Kaylyn Layer
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
## 2018-05-10 NOTE — Anesthesia Procedure Notes (Signed)
Spinal  Patient location during procedure: OR Start time: 05/10/2018 1:33 PM End time: 05/10/2018 1:36 PM Staffing Anesthesiologist: Kaylyn Layer, MD Performed: anesthesiologist  Preanesthetic Checklist Completed: patient identified, site marked, pre-op evaluation, timeout performed, IV checked, risks and benefits discussed and monitors and equipment checked Spinal Block Patient position: sitting Prep: DuraPrep Patient monitoring: heart rate, cardiac monitor and continuous pulse ox Approach: midline Location: L3-4 Injection technique: single-shot Needle Needle type: Pencan and Sprotte  Needle gauge: 24 G Needle length: 15 cm Needle insertion depth: 6 cm Catheter type: closed end flexible Catheter size: 20 g Catheter at skin depth: 12 cm Assessment Sensory level: T4 Additional Notes Risks, benefits, and alternative discussed. Patient gave consent to procedure. Prepped and draped in sitting position. Crisp LOR to air with Tuohy needle at 6cm. Spinal needle introduced through Tuohy with clear CSF obtained. Intrathecal medications given as charted. No pain or paraesthesias with injection. Spinal needle withdrawn and epidural catheter threaded to 12cm. Patient tolerated procedure well. Vital signs stable. Amalia Greenhouse, MD

## 2018-05-10 NOTE — Transfer of Care (Signed)
Immediate Anesthesia Transfer of Care Note  Patient: Julia Armstrong  Procedure(s) Performed: REPEAT CESAREAN SECTION (N/A )  Patient Location: PACU  Anesthesia Type:Spinal  Level of Consciousness: awake, alert  and oriented  Airway & Oxygen Therapy: Patient Spontanous Breathing  Post-op Assessment: Report given to RN and Post -op Vital signs reviewed and stable  Post vital signs: Reviewed and stable  Last Vitals:  Vitals Value Taken Time  BP    Temp    Pulse 71 05/10/2018  3:30 PM  Resp 19 05/10/2018  3:30 PM  SpO2 95 % 05/10/2018  3:30 PM  Vitals shown include unvalidated device data.  Last Pain:  Vitals:   05/10/18 1140  TempSrc: Oral  PainSc:          Complications: No apparent anesthesia complications

## 2018-05-10 NOTE — Op Note (Addendum)
Cesarean Section Operative Report  Julia Armstrong  PROCEDURE DATE: 05/10/2018  PREOPERATIVE DIAGNOSES: Intrauterine pregnancy at [redacted]w[redacted]d weeks gestation. Patient desired repeat c-section  POSTOPERATIVE DIAGNOSES: The same  PROCEDURE: Repeat Low Transverse Cesarean Section with Prevena incision device placed  SURGEON:   Surgeon(s) and Role:    * Draper Bing, MD - Primary  ASSISTANT:  Marcy Siren, DO - OB Fellow  FINDINGS:  Viable [phone] female infant in cephalic presentation.  Apgars 7 and 7.  Clear amniotic fluid.  Intact placenta, three vessel cord.  Normal uterus, fallopian tubes and ovaries bilaterally. Omental adhesion at bladder without bleeding at defect.   ANESTHESIA: Combined Spinal and Epidural  INTRAVENOUS FLUIDS: 3200 mL  ESTIMATED BLOOD LOSS: 207 mL URINE OUTPUT:  100 ml SPECIMENS: Placenta sent to L&D COMPLICATIONS: None immediate  PROCEDURE IN DETAIL:  The patient was taken to the operating room where anesthesia was administered and normal fetal heart tones were confirmed. She was then prepped and draped in the normal fashion in the dorsal supine position with a leftward tilt.  After a time out was performed, a pfannensteil  skin incision was made with the scalpel and carried through to the underlying layer of fascia. The fascia was then incised at the midline and this incision was extended laterally with the mayo scissors. Attention was turned to the superior aspect of the fascial incision which was grasped with the kocher clamps x 2, tented up and the rectus muscles were dissected off with the scalpel and bluntly. In a similar fashion the inferior aspect of the fascial incision was grasped with the kocher clamps, tented up and the rectus muscles dissected off with the mayo scissors. The rectus muscles were then separated in the midline and the peritoneum was entered bluntly and the uterus apparent with a large omental defect exposing the uterus noted (see above).  An alexis wound retractor was then inserted.   A low transverse hysterotomy was made with the scalpel until the endometrial cavity was breached and the amniotic sac ruptured with the Allis clamp, yielding clear amniotic fluid. This incision was extended bluntly and the infant's head, shoulders and body were delivered atraumatically.The cord was clamped x 2 and cut, and the infant was handed to the awaiting pediatricians, after delayed cord clamping was done.  The placenta was then gradually expressed from the uterus and then the uterus was exteriorized and cleared of all clots and debris. The hysterotomy was repaired with a running suture of 1-0 vicryl. A second imbricating layer of 1-0 vicrly suture was then placed. A figure-of-eight suture of 1-0 chromic was added to achieve excellent hemostasis.  The uterus and adnexa were then returned to the abdomen, and the hysterotomy and all operative sites were reinspected and excellent hemostasis was noted after irrigation and suction of the abdomen with warm saline. The omentum was reinspected and no bleeding noted. The peritoneum was closed with a running stitch of 3-0 Vicryl. The fascia was reapproximated with 0 Vicryl in a simple running fashion bilaterally. The subcutaneous layer was then reapproximated with interrupted sutures of 2-0 plain gut, and the skin was then closed with 4-0 monocryl, in a subcuticular fashion. A Prevena wound vac was placed after skin closure.   The patient tolerated the procedure well. Sponge, lap, instrument and needle counts were correct x 3.  She was taken to the recovery room in stable condition.     Disposition: PACU - hemodynamically stable.   Maternal Condition: stable   Marcy Siren,  D.O. OB Fellow  05/10/2018, 3:19 PM    Agree with above. I was present and scrubbed for the entire procedure.   Cornelia Copa MD Attending [facility] [employer] [facility])

## 2018-05-11 LAB — TYPE AND SCREEN
ABO/RH(D): AB POS
Antibody Screen: NEGATIVE
UNIT DIVISION: 0
UNIT DIVISION: 0

## 2018-05-11 LAB — BPAM RBC
BLOOD PRODUCT EXPIRATION DATE: 201911142359
Blood Product Expiration Date: 201911142359
UNIT TYPE AND RH: 5100
Unit Type and Rh: 5100

## 2018-05-11 LAB — CREATININE, SERUM: CREATININE: 0.54 mg/dL (ref 0.44–1.00)

## 2018-05-11 LAB — CBC
HCT: 33.3 % — ABNORMAL LOW (ref 36.0–46.0)
Hemoglobin: 11 g/dL — ABNORMAL LOW (ref 12.0–15.0)
MCH: 24.8 pg — AB (ref 26.0–34.0)
MCHC: 33 g/dL (ref 30.0–36.0)
MCV: 75.2 fL — AB (ref 80.0–100.0)
PLATELETS: 161 10*3/uL (ref 150–400)
RBC: 4.43 MIL/uL (ref 3.87–5.11)
RDW: 15.2 % (ref 11.5–15.5)
WBC: 11.3 10*3/uL — ABNORMAL HIGH (ref 4.0–10.5)
nRBC: 0 % (ref 0.0–0.2)

## 2018-05-11 NOTE — Anesthesia Postprocedure Evaluation (Signed)
Anesthesia Post Note  Patient: AMERIKA NOURSE  Procedure(s) Performed: REPEAT CESAREAN SECTION (N/A )     Patient location during evaluation: Mother Baby Anesthesia Type: Combined Spinal/Epidural Level of consciousness: awake Pain management: pain level controlled Vital Signs Assessment: post-procedure vital signs reviewed and stable Respiratory status: spontaneous breathing Cardiovascular status: stable Postop Assessment: patient able to bend at knees, no backache, epidural receding, no headache and spinal receding Anesthetic complications: no    Last Vitals:  Vitals:   05/11/18 0000 05/11/18 0913  BP:  116/67  Pulse:  63  Resp: 16   Temp: 37.1 C 36.8 C  SpO2:  100%    Last Pain:  Vitals:   05/11/18 0913  TempSrc: Oral  PainSc:    Pain Goal:                 Edison Pace

## 2018-05-11 NOTE — Addendum Note (Signed)
Addendum  created 05/11/18 1003 by Earmon Phoenix, CRNA   Sign clinical note

## 2018-05-11 NOTE — Lactation Note (Signed)
This note was copied from a baby's chart. Lactation Consultation Note  Patient Name: Julia Armstrong [id number] Date: 05/11/2018 Reason for consult: Initial assessment;Term P4, term infant , Mom's  feeding choice at admission was breastfeeding.  LC entered room,  per mom , my current feeding choice is formula only.  Maternal Data    Feeding Feeding Type: Bottle Fed - Formula Nipple Type: Slow - flow  LATCH Score                   Interventions    Lactation Tools Discussed/Used     Consult Status      Danelle Earthly 05/11/2018, 8:45 PM
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
## 2018-05-12 NOTE — Progress Notes (Signed)
CSW spoke with CPS supervisor, L. Harper via telephone.   There are no barriers to discharge and CPS will continue to offer resources and supports to family.  CSW will provide CPS with CDS results when they become available.   Eldrige Pitkin Boyd-Gilyard, MSW, LCSW Clinical Social Work [phone]  

## 2018-05-12 NOTE — Progress Notes (Signed)
POSTPARTUM PROGRESS NOTE  POD #2  Subjective:  Julia Armstrong is a 27 y.o. [id number] s/p rLTCS at [redacted]w[redacted]d.  She reports she doing well. No acute events overnight. She reports she is doing well. She denies any problems with ambulating, voiding or po intake. Denies nausea or vomiting. She has passed flatus. Pain is moderately controlled.  Lochia is appropriate.  Objective: Blood pressure 115/75, pulse 63, temperature 97.6 F (36.4 C), temperature source Oral, resp. rate 18, height 5\' 7"  (1.702 m), weight 97.1 kg, last menstrual period 08/05/2017, SpO2 100 %, unknown if currently breastfeeding.  Physical Exam:  General: alert, cooperative and no distress Chest: no respiratory distress Heart:regular rate, distal pulses intact Abdomen: soft, nontender,  Uterine Fundus: firm, appropriately tender DVT Evaluation: No calf swelling or tenderness Extremities: No LE edema Skin: warm, dry; incision clean/dry/intact w/ Prevena in place  Recent Labs    05/11/18 0555  HGB 11.0*  HCT 33.3*    Assessment/Plan: Julia Armstrong is a 27 y.o. [id number] s/p rLTCS at [redacted]w[redacted]d for elective.  POD#2 - Doing welll; pain moderately controlled. H/H appropriate  Routine postpartum care  OOB, ambulated  Lovenox for VTE prophylaxis SW has seen patient and has found no barriers to discharge.  Contraception: undecided  Feeding: bottle   Dispo: Plan for discharge PPD#3. Patient requests to stay another day for better pain control.    LOS: 2 days   Marcy Siren, D.O. OB Fellow  05/12/2018, 2:48 PM

## 2018-05-13 MED ORDER — SENNOSIDES-DOCUSATE SODIUM 8.6-50 MG PO TABS: 2.0000 | ORAL_TABLET | ORAL | 0 refills | Status: DC

## 2018-05-13 MED ORDER — IBUPROFEN 600 MG PO TABS: 600.0000 mg | ORAL_TABLET | Freq: Four times a day (QID) | ORAL | 0 refills | Status: DC

## 2018-05-13 MED ORDER — OXYCODONE HCL 5 MG PO TABS: 5.0000 mg | ORAL_TABLET | ORAL | 0 refills | Status: DC | PRN
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
## 2018-05-13 NOTE — Plan of Care (Signed)
Progressing appropriately. Encouraged to call for assistance as needed.  

## 2018-05-21 ENCOUNTER — Inpatient Hospital Stay (HOSPITAL_COMMUNITY)
Admission: AD | Admit: 2018-05-21 | Discharge: 2018-05-21 | Disposition: A | Discharge status: Home / Self Care | Payer: Medicaid Other | Source: Ambulatory Visit | Attending: Obstetrics and Gynecology | Admitting: Obstetrics and Gynecology

## 2018-05-21 ENCOUNTER — Encounter (HOSPITAL_COMMUNITY): Payer: Self-pay

## 2018-05-21 DIAGNOSIS — Z5189 Encounter for other specified aftercare: Secondary | ICD-10-CM

## 2018-05-21 DIAGNOSIS — L299 Pruritus, unspecified: Secondary | ICD-10-CM | POA: Insufficient documentation

## 2018-05-21 LAB — CBC
HCT: 35.3 % — ABNORMAL LOW (ref 36.0–46.0)
HEMOGLOBIN: 11.5 g/dL — AB (ref 12.0–15.0)
MCH: 25 pg — AB (ref 26.0–34.0)
MCHC: 32.6 g/dL (ref 30.0–36.0)
MCV: 76.7 fL — AB (ref 80.0–100.0)
NRBC: 0 % (ref 0.0–0.2)
PLATELETS: 273 10*3/uL (ref 150–400)
RBC: 4.6 MIL/uL (ref 3.87–5.11)
RDW: 15.9 % — ABNORMAL HIGH (ref 11.5–15.5)
WBC: 6.6 10*3/uL (ref 4.0–10.5)

## 2018-05-21 NOTE — MAU Note (Signed)
Pt states she went to the bathroom around 1:00am and had a large gush of blood come out. States she passed a golf ball sized clot. Reports some incisional pain. Pt had a c/d on 05/10/18. PT has a wound vac-was suppose to be removed on Monday but missed her appt.

## 2018-05-21 NOTE — Discharge Instructions (Signed)
Postpartum Hemorrhage Postpartum hemorrhage is excessive blood loss after childbirth. YOUR BLOOD LOSS HAS NOT BEEN EXCESSIVE Vaginal bleeding after delivery is normal and should be expected. Bleeding (lochia) will occur for several days after childbirth. This can be expected with normal vaginal deliveries and cesarean deliveries. However, postpartum hemorrhage is a potentially serious condition. What are the causes? This condition is caused by:  A loss of muscle tone in the uterus after childbirth. This can be caused by: ? An abnormal placenta. ? Infection. ? Bladder swelling (distension).  Failure to deliver all of the placenta or the retention of clots.  Wounds in the birth canal caused by delivery of the fetus.  Infection of the uterus.  Infection of tissue around the fetus.  A tear in the uterus.  Tearing of the vagina or cervix during delivery.  A maternal bleeding disorder that prevents blood from clotting (rare).  What increases the risk? This condition is likely to develop in people who:  Have a history of postpartum hemorrhage.  Had a delivery that lasted longer than usual.  Have an excess of amniotic fluid in the amniotic sac (polyhydramnios), leading the uterus to stretch too much.  Have delivered quintuplets or more babies.  Had high blood pressure, seizures, or coma during pregnancy.  Had a condition called preeclampsia or eclampsia during pregnancy.  Had problems with the placenta.  Had complications during labor or delivery.  Are obese.  Are 26 years old or older.  Are Asian or Hispanic.  What are the signs or symptoms? Symptoms of this condition include:  Passing large clots or pieces of tissue. These may be small pieces of placenta left after delivery.  Soaking more than one sanitary pad per hour for several hours.  Heavy, bright-red bleeding that occurs 4 days or more after delivery.  Discharge that has a bad smell.  An unexplained  fever.  Nausea or vomiting.  Pain or swelling near the vagina or perineum.  A drop in blood pressure.  Lightheadedness or fainting.  Shortness of breath.  A fast heart rate that happens with very little activity.  Signs of shock, such as: ? Blurry vision. ? Chills. ? Dizziness. ? Weakness.  How is this diagnosed? This condition may be diagnosed based on:  Your symptoms.  A physical exam of your perineum, vagina, cervix, and uterus.  Tests, including: ? Blood pressure and pulse measurements. ? Blood tests. ? Blood clotting tests. ? Ultrasonography.  How is this treated? Treatment for this condition depends on the severity of your symptoms. It may include:  Uterine massage.  Medicines to help the uterus contract.  Blood transfusions.  Fluids given through the vein.  A medical procedure to compress arteries supplying the uterus.  Sometimes bleeding occurs if portions of the placenta are left behind in the uterus after delivery. If this happens, a curettage or scraping of the inside of the uterus must be done (rare). This usually stops the bleeding. If curettage does not stop the bleeding, surgery may be done to remove the uterus (hysterectomy), but this rarely occurs. If bleeding is due to clotting or bleeding problems that are not related to the pregnancy, other treatments may be needed. Follow these instructions at home:  Limit your activity as directed by your health care provider.Your health care provider may order bed rest (getting up to go to the bathroom only) or may allow you to continue light activity.  Keep track of the number of pads you use each day and  how soaked (saturated) they are. Write down this information.  Do not use tampons.  Do not douche or have sexual intercourse until your health care provider approves.  Drink enough fluids to keep your urine clear or pale yellow.  Get enough rest.  Eat foods that are rich in iron, such as spinach,  red meat, and legumes.  Take any over-the-counter and prescription medicines only as told by your health care provider.  Keep all follow-up visits as told by your health care provider. This is important. Get help right away if:  You experience severe cramps in your stomach, back, or abdomen.  You have a fever.  You pass large clots or tissue. Save any tissue for your health care provider to look at.  Your bleeding increases.  You have heavy bleeding that soaks one pad per hour for 2 hours in a row.  You faint or become dizzy, weak, or lightheaded.  Your sanitary pad count per hour is increasing.  You are urinating less than usual or not at all.  You have shortness of breath.  Your heart rate is faster than usual.  You have sudden chest pain. This information is not intended to replace advice given to you by your health care provider. Make sure you discuss any questions you have with your health care provider. Document Released: 10/04/2003 Document Revised: 03/12/2016 Document Reviewed: 02/15/2016 Elsevier Interactive Patient Education  [employer].

## 2018-05-21 NOTE — MAU Provider Note (Signed)
Chief Complaint:  Postpartum Complications and Vaginal Bleeding   First Provider Initiated Contact with Patient 05/21/18 0208     HPI: Julia Armstrong is a 27 y.o. [id number] who presents to maternity admissions reporting passage of a blood clot at home.  States what she really wants it to get the Preveena removed because it is itching her a lot.  She missed her appointment in the clinic to have it removed.  States it is too hard for her to come to appointments during the day because of child care so she came here tonight. . She reports vaginal bleeding, but no vaginal itching/burning, urinary symptoms, h/a, dizziness, n/v, or fever/chills.    C/S on 05/10/18  HPI RN Note: Pt states she went to the bathroom around 1:00am and had a large gush of blood come out. States she passed a golf ball sized clot. Reports some incisional pain. Pt had a c/d on 05/10/18. PT has a wound vac-was suppose to be removed on Monday but missed her appt.   Past Medical History: Past Medical History:  Diagnosis Date  . Gallstones   . Wound infection after surgery 2016   c-section incision had to be reopened and wound vac placed    Past obstetric history: OB History  Gravida Para Term Preterm AB Living  6 4 4   2 4   SAB TAB Ectopic Multiple Live Births  2     0 4    # Outcome Date GA Lbr Len/2nd Weight Sex Delivery Anes PTL Lv  6 Term 05/10/18 [redacted]w[redacted]d  3650 g F CS-LTranv Other  LIV     Birth Comments: cesection  5 SAB 01/25/17          4 Term 02/21/15    M CS-LTranv   LIV  3 Term 06/27/13    M CS-LTranv   LIV  2 Term 07/09/12    M CS-LTranv   LIV  1 SAB             Past Surgical History: Past Surgical History:  Procedure Laterality Date  . CESAREAN SECTION     C/S x 3  . CESAREAN SECTION N/A 05/10/2018   Procedure: REPEAT CESAREAN SECTION;  Surgeon: [place] Bing, MD;  Location: [facility] BIRTHING SUITES;  Service: Obstetrics;  Laterality: N/A;  . DILATION AND EVACUATION N/A 02/18/2017   Procedure:  DILATATION AND EVACUATION;  Surgeon: Tereso Newcomer, MD;  Location: [facility] ORS;  Service: Gynecology;  Laterality: N/A;  . WISDOM TOOTH EXTRACTION      Family History: Family History  Problem Relation Age of Onset  . Cancer Other   . Cancer Maternal Grandmother        breast cancer    Social History: Social History   Tobacco Use  . Smoking status: Never Smoker  . Smokeless tobacco: Never Used  Substance Use Topics  . Alcohol use: No  . Drug use: No    Allergies: No Known Allergies  Meds:  Medications Prior to Admission  Medication Sig Dispense Refill Last Dose  . ibuprofen (ADVIL,MOTRIN) 600 MG tablet Take 1 tablet (600 mg total) by mouth every 6 (six) hours. 30 tablet 0   . oxyCODONE (OXY IR/ROXICODONE) 5 MG immediate release tablet Take 1 tablet (5 mg total) by mouth every 4 (four) hours as needed (pain scale 4-7). 6 tablet 0   . senna-docusate (SENOKOT-S) 8.6-50 MG tablet Take 2 tablets by mouth daily. 12 tablet 0     I have reviewed patient's  Past Medical Hx, Surgical Hx, Family Hx, Social Hx, medications and allergies.  ROS:  Review of Systems Other systems negative     Physical Exam   Patient Vitals for the past 24 hrs:  BP Temp Temp src Pulse Resp SpO2  05/21/18 0116 116/76 98.2 F (36.8 C) Oral 83 18 100 %   Constitutional: Well-developed, well-nourished female in no acute distress.  Cardiovascular: normal rate and rhythm Respiratory: normal effort, no distress GI: Abd soft, and appropriately tender.  Nondistended.  No rebound, No guarding.  Bowel Sounds audible  Wound clean and intact with wound vac in place.   MS: Extremities nontender, no edema, normal ROM Neurologic: Alert and oriented x 4.   Grossly nonfocal. GU: Neg CVAT. Skin:  Warm and Dry Psych:  Affect appropriate.  PELVIC EXAM: Cervix pink, visually closed, without lesion, scant bloody discharge, vaginal walls and external genitalia normal No clots or active bleeding noted    Labs: Results for orders placed or performed during the hospital encounter of 05/21/18 (from the past 24 hour(s))  CBC     Status: Abnormal   Collection Time: 05/21/18  2:12 AM  Result Value Ref Range   WBC 6.6 4.0 - 10.5 K/uL   RBC 4.60 3.87 - 5.11 MIL/uL   Hemoglobin 11.5 (L) 12.0 - 15.0 g/dL   HCT 29.5 (L) 28.4 - 13.2 %   MCV 76.7 (L) 80.0 - 100.0 fL   MCH 25.0 (L) 26.0 - 34.0 pg   MCHC 32.6 30.0 - 36.0 g/dL   RDW 44.0 (H) 10.2 - 72.5 %   Platelets 273 150 - 400 K/uL   nRBC 0.0 0.0 - 0.2 %   --/--/AB POS (10/11 1040)  Imaging:  No results found.  MAU Course/MDM: I have ordered labs as follows: CBC which showed no decrease in Hemoglobin.   Imaging ordered: none    Treatments in MAU included removal of wound vac.  Wound is clean and intact Pt stable at time of discharge.  Assessment: 11 days postop Postpartum bleeding Itching under wound vac, missed appt in clinic for removal  Plan: Discharge home Recommend Keep wound clean and dry Followup with postop appointment Encouraged to return here or to other Urgent Care/ED if she develops worsening of symptoms, increase in pain, fever, or other concerning symptoms.   Wynelle Bourgeois CNM, MSN Certified Nurse-Midwife 05/21/2018 3:20 AM

## 2018-06-17 ENCOUNTER — Encounter: Payer: Self-pay | Admitting: Family Medicine

## 2018-06-17 ENCOUNTER — Ambulatory Visit: Payer: Self-pay | Admitting: Family Medicine

## 2018-06-17 NOTE — Progress Notes (Signed)
Patient did not keep appointment today. She will be called to reschedule.  

## 2018-07-11 ENCOUNTER — Inpatient Hospital Stay (HOSPITAL_COMMUNITY)
Admission: AD | Admit: 2018-07-11 | Discharge: 2018-07-11 | Discharge status: Against Medical Advice | Payer: Medicaid Other | Source: Ambulatory Visit | Attending: Family Medicine | Admitting: Family Medicine

## 2018-07-11 NOTE — MAU Note (Signed)
Pt not in lobby.  

## 2018-07-11 NOTE — MAU Note (Signed)
Pt called for Triage and not in lobby. Admission personnel states pt left.

## 2018-08-16 ENCOUNTER — Other Ambulatory Visit: Payer: Self-pay

## 2018-08-16 ENCOUNTER — Ambulatory Visit (HOSPITAL_COMMUNITY)
Admission: EM | Admit: 2018-08-16 | Discharge: 2018-08-16 | Disposition: A | Discharge status: Home / Self Care | Payer: Medicaid Other | Attending: Family Medicine | Admitting: Family Medicine

## 2018-08-16 ENCOUNTER — Encounter (HOSPITAL_COMMUNITY): Payer: Self-pay | Admitting: Emergency Medicine

## 2018-08-16 DIAGNOSIS — N760 Acute vaginitis: Secondary | ICD-10-CM | POA: Diagnosis not present

## 2018-08-16 MED ORDER — METRONIDAZOLE 500 MG PO TABS: 500.0000 mg | ORAL_TABLET | Freq: Two times a day (BID) | ORAL | 0 refills | Status: DC

## 2018-08-16 NOTE — ED Triage Notes (Signed)
Vaginal irritation for 2 days.  Unknown if discharge or odor present. Denies urinary issues

## 2018-08-16 NOTE — Discharge Instructions (Addendum)
Exam consistent with bacterial vaginosis We will go ahead and treat this with Flagyl twice a day for the next 7 days Do not drink alcohol with taking this medication, you very sick. Swab sent for further testing we will call you with any positive results Follow up as needed for continued or worsening symptoms

## 2018-08-16 NOTE — ED Provider Notes (Signed)
[facility]    CSN: [id number] Arrival date & time: 08/16/18  1326     History   Chief Complaint Chief Complaint  Patient presents with  . Vaginal Itching    HPI BREKKEN FANG is a 28 y.o. female.   Patient is a 28 year old female who presents with vaginal irritation with mild itching.  She reports this started today.  Her symptoms been constant.  She denies any vaginal discharge, odor.  She denies any dysuria, hematuria, urinary frequency.  She denies any fevers.  She is having mild lower abdominal discomfort.  She is currently sexually active.  Her last menstrual period was 08/05/2018.  ROS per HPI      Past Medical History:  Diagnosis Date  . Gallstones   . Wound infection after surgery 2016   c-section incision had to be reopened and wound vac placed    Patient Active Problem List   Diagnosis Date Noted  . 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 Status post repeat low transverse cesarean section 05/10/2018  . Drug use 02/26/2018  . Syncope 02/16/2018  . Obesity (BMI 35.0-39.9 without comorbidity) 12/01/2017  . Supervision of other normal pregnancy, antepartum 11/03/2017  . Previous cesarean delivery, antepartum 11/03/2017  . History of depression 11/03/2017  . Second degree burn of multiple fingers of right hand including thumb 10/09/2016  . Wound infection after surgery 02/28/2015  . Other known or suspected fetal abnormality, not elsewhere classified, affecting management of mother, antepartum condition or complication 02/05/2012    Past Surgical History:  Procedure Laterality Date  . CESAREAN SECTION     C/S x 3  . CESAREAN SECTION N/A 05/10/2018   Procedure: REPEAT CESAREAN SECTION;  Surgeon: Mellette Bing, MD;  Location: [facility] BIRTHING SUITES;  Service: Obstetrics;  Laterality: N/A;  . DILATION AND EVACUATION N/A 02/18/2017   Procedure: DILATATION AND EVACUATION;  Surgeon: Tereso Newcomer, MD;  Location: [facility];  Service: Gynecology;   Laterality: N/A;  . WISDOM TOOTH EXTRACTION      OB History    Gravida  6   Para  4   Term  4   Preterm      AB  2   Living  4     SAB  2   TAB      Ectopic      Multiple  0   Live Births  4            Home Medications    Prior to Admission medications   Medication Sig Start Date End Date Taking? Authorizing Provider  ibuprofen (ADVIL,MOTRIN) 600 MG tablet Take 1 tablet (600 mg total) by mouth every 6 (six) hours. 05/13/18   Gwenevere Abbot, MD  metroNIDAZOLE (FLAGYL) 500 MG tablet Take 1 tablet (500 mg total) by mouth 2 (two) times daily. 08/16/18   Dahlia Byes A, NP  oxyCODONE (OXY IR/ROXICODONE) 5 MG immediate release tablet Take 1 tablet (5 mg total) by mouth every 4 (four) hours as needed (pain scale 4-7). 05/13/18   Gwenevere Abbot, MD  senna-docusate (SENOKOT-S) 8.6-50 MG tablet Take 2 tablets by mouth daily. 05/14/18   Gwenevere Abbot, MD    Family History Family History  Problem Relation Age of Onset  . Cancer Other   . Cancer Maternal Grandmother        breast cancer    Social History Social History   Tobacco Use  . Smoking status: Never Smoker  . Smokeless tobacco: Never Used  Substance Use  Topics  . Alcohol use: Yes  . Drug use: No     Allergies   Patient has no known allergies.   Review of Systems Review of Systems   Physical Exam Triage Vital Signs ED Triage Vitals  Enc Vitals Group     BP 08/16/18 1452 116/77     Pulse Rate 08/16/18 1452 76     Resp 08/16/18 1452 18     Temp 08/16/18 1452 97.9 F (36.6 C)     Temp Source 08/16/18 1452 Temporal     SpO2 08/16/18 1452 100 %     Weight --      Height --      Head Circumference --      Peak Flow --      Pain Score 08/16/18 1451 0     Pain Loc --      Pain Edu? --      Excl. in GC? --    No data found.  Updated Vital Signs BP 116/77 (BP Location: Right Arm)   Pulse 76   Temp 97.9 F (36.6 C) (Temporal)   Resp 18   LMP 08/05/2018   SpO2 100%   Visual  Acuity Right Eye Distance:   Left Eye Distance:   Bilateral Distance:    Right Eye Near:   Left Eye Near:    Bilateral Near:     Physical Exam Vitals signs and nursing note reviewed.  Constitutional:      General: She is not in acute distress.    Appearance: She is well-developed.  HENT:     Head: Normocephalic and atraumatic.  Eyes:     Conjunctiva/sclera: Conjunctivae normal.  Neck:     Musculoskeletal: Neck supple.  Cardiovascular:     Rate and Rhythm: Normal rate and regular rhythm.     Heart sounds: No murmur.  Pulmonary:     Effort: Pulmonary effort is normal. No respiratory distress.     Breath sounds: Normal breath sounds.  Abdominal:     Palpations: Abdomen is soft.     Tenderness: There is no abdominal tenderness.  Genitourinary:    Comments: External vaginal area appears normal without lesions White discharge noted around the vaginal opening  Speculum exam reveals white milky vaginal discharge with fishy odor  No cervical motion tenderness Musculoskeletal: Normal range of motion.  Skin:    General: Skin is warm and dry.  Neurological:     Mental Status: She is alert.  Psychiatric:        Mood and Affect: Mood normal.      UC Treatments / Results  Labs (all labs ordered are listed, but only abnormal results are displayed) Labs Reviewed - No data to display  EKG None  Radiology No results found.  Procedures Procedures (including critical care time)  Medications Ordered in UC Medications - No data to display  Initial Impression / Assessment and Plan / UC Course  I have reviewed the triage vital signs and the nursing notes.  Pertinent labs & imaging results that were available during my care of the patient were reviewed by me and considered in my medical decision making (see chart for details).     Symptoms and exam most consistent with bacterial vaginosis We will go ahead and treat with Flagyl twice a day for 7 days Swab sent for  further testing.  Will call with any positive results. Final Clinical Impressions(s) / UC Diagnoses   Final diagnoses:  Vaginitis and vulvovaginitis  Discharge Instructions     Exam consistent with bacterial vaginosis We will go ahead and treat this with Flagyl twice a day for the next 7 days Do not drink alcohol with taking this medication, you very sick. Swab sent for further testing we will call you with any positive results Follow up as needed for continued or worsening symptoms     ED Prescriptions    Medication Sig Dispense Auth. Provider   metroNIDAZOLE (FLAGYL) 500 MG tablet Take 1 tablet (500 mg total) by mouth 2 (two) times daily. 14 tablet Dahlia Byes A, NP     Controlled Substance Prescriptions [place] Controlled Substance Registry consulted? Not Applicable   Janace Aris, NP 08/16/18 1527

## 2018-08-17 LAB — CERVICOVAGINAL ANCILLARY ONLY
Bacterial vaginitis: POSITIVE — AB
CANDIDA VAGINITIS: POSITIVE — AB
Chlamydia: NEGATIVE
Neisseria Gonorrhea: NEGATIVE
Trichomonas: NEGATIVE

## 2018-08-19 ENCOUNTER — Telehealth (HOSPITAL_COMMUNITY): Payer: Self-pay | Admitting: Emergency Medicine

## 2018-08-19 MED ORDER — FLUCONAZOLE 150 MG PO TABS: 150.0000 mg | ORAL_TABLET | Freq: Once | ORAL | 0 refills | Status: AC

## 2018-08-19 NOTE — Telephone Encounter (Signed)
Bacterial Vaginosis test is positive.  Prescription for metronidazole was given at the urgent care visit.  Test for candida (yeast) was positive.  Prescription for fluconazole 150mg po now, repeat dose in 3d if needed, #2 no refills, sent to the pharmacy of record.  Recheck or followup with PCP for further evaluation if symptoms are not improving.    Patient contacted and made aware of all results, all questions answered.   

## 2018-08-29 ENCOUNTER — Ambulatory Visit (HOSPITAL_COMMUNITY)
Admission: EM | Admit: 2018-08-29 | Discharge: 2018-08-29 | Disposition: A | Discharge status: Home / Self Care | Payer: Self-pay | Attending: Family Medicine | Admitting: Family Medicine

## 2018-08-29 ENCOUNTER — Telehealth (HOSPITAL_COMMUNITY): Payer: Self-pay | Admitting: Emergency Medicine

## 2018-08-29 DIAGNOSIS — B373 Candidiasis of vulva and vagina: Secondary | ICD-10-CM

## 2018-08-29 DIAGNOSIS — Z760 Encounter for issue of repeat prescription: Secondary | ICD-10-CM

## 2018-08-29 DIAGNOSIS — B3731 Acute candidiasis of vulva and vagina: Secondary | ICD-10-CM

## 2018-08-29 MED ORDER — FLUCONAZOLE 150 MG PO TABS: 150.0000 mg | ORAL_TABLET | Freq: Once | ORAL | 0 refills | Status: AC

## 2018-08-29 NOTE — Telephone Encounter (Signed)
Pt only here for med refill, states she dropped her diflucan and was prescribed it a week ago, requesting a refill, per Amy PA okay to do a nurse visit.

## 2018-08-29 NOTE — ED Triage Notes (Signed)
Here for med refill, dropped diflucan, per Amy PA okay to do nurse visit.

## 2018-09-03 NOTE — ED Provider Notes (Signed)
Medicine refill only   Julia Moore, MD 09/03/18 2016

## 2018-09-05 ENCOUNTER — Encounter (HOSPITAL_COMMUNITY): Payer: Self-pay | Admitting: Emergency Medicine

## 2018-09-05 ENCOUNTER — Other Ambulatory Visit: Payer: Self-pay

## 2018-09-05 ENCOUNTER — Ambulatory Visit (HOSPITAL_COMMUNITY)
Admission: EM | Admit: 2018-09-05 | Discharge: 2018-09-05 | Disposition: A | Discharge status: Home / Self Care | Payer: Self-pay | Attending: Family Medicine | Admitting: Family Medicine

## 2018-09-05 DIAGNOSIS — R69 Illness, unspecified: Secondary | ICD-10-CM

## 2018-09-05 DIAGNOSIS — J111 Influenza due to unidentified influenza virus with other respiratory manifestations: Secondary | ICD-10-CM

## 2018-09-05 MED ORDER — OSELTAMIVIR PHOSPHATE 75 MG PO CAPS: 75.0000 mg | ORAL_CAPSULE | Freq: Two times a day (BID) | ORAL | 0 refills | Status: DC

## 2018-09-05 NOTE — Discharge Instructions (Signed)
Most likely this is a flu like illness Tamiflu twice a day for the next 5 days Tylenol/ibuprofen for fever or body aches Mucinex for cough and congestion as needed every 12 hours Follow up as needed for continued or worsening symptoms

## 2018-09-05 NOTE — ED Triage Notes (Signed)
The patient presented to the [facility] with a complaint of a cough x 3 days. 

## 2018-09-07 NOTE — ED Provider Notes (Signed)
[facility]    CSN: [id number] Arrival date & time: 09/05/18  1358     History   Chief Complaint Chief Complaint  Patient presents with  . Cough    HPI Julia Armstrong is a 28 y.o. female.   Pt is a 28 year old female  that presents with flu like symptoms to include; fever, chills, myalgias, cough, congestion and rhinorrhea. This has been present and worsened over the past 2 days. Taking OTC meds for fever and symptoms with come relief and decrease in the fever. Positive sick contact. No recent traveling. Decrease in appetite but drinking fluids. No N,V,D.   ROS per HPI       Past Medical History:  Diagnosis Date  . Gallstones   . Wound infection after surgery 2016   c-section incision had to be reopened and wound vac placed    Patient Active Problem List   Diagnosis Date Noted  . 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 Status post repeat low transverse cesarean section 05/10/2018  . Drug use 02/26/2018  . Syncope 02/16/2018  . Obesity (BMI 35.0-39.9 without comorbidity) 12/01/2017  . Supervision of other normal pregnancy, antepartum 11/03/2017  . Previous cesarean delivery, antepartum 11/03/2017  . History of depression 11/03/2017  . Second degree burn of multiple fingers of right hand including thumb 10/09/2016  . Wound infection after surgery 02/28/2015  . Other known or suspected fetal abnormality, not elsewhere classified, affecting management of mother, antepartum condition or complication 02/05/2012    Past Surgical History:  Procedure Laterality Date  . CESAREAN SECTION     C/S x 3  . CESAREAN SECTION N/A 05/10/2018   Procedure: REPEAT CESAREAN SECTION;  Surgeon: [place] Bing, MD;  Location: [facility] BIRTHING SUITES;  Service: Obstetrics;  Laterality: N/A;  . DILATION AND EVACUATION N/A 02/18/2017   Procedure: DILATATION AND EVACUATION;  Surgeon: Tereso Newcomer, MD;  Location: [facility];  Service: Gynecology;  Laterality: N/A;  . WISDOM  TOOTH EXTRACTION      OB History    Gravida  6   Para  4   Term  4   Preterm      AB  2   Living  4     SAB  2   TAB      Ectopic      Multiple  0   Live Births  4            Home Medications    Prior to Admission medications   Medication Sig Start Date End Date Taking? Authorizing Provider  oseltamivir (TAMIFLU) 75 MG capsule Take 1 capsule (75 mg total) by mouth every 12 (twelve) hours. 09/05/18   Janace Aris, NP    Family History Family History  Problem Relation Age of Onset  . Cancer Other   . Cancer Maternal Grandmother        breast cancer    Social History Social History   Tobacco Use  . Smoking status: Never Smoker  . Smokeless tobacco: Never Used  Substance Use Topics  . Alcohol use: Yes  . Drug use: No     Allergies   Patient has no known allergies.   Review of Systems Review of Systems   Physical Exam Triage Vital Signs ED Triage Vitals [09/05/18 1415]  Enc Vitals Group     BP 129/78     Pulse Rate (!) 114     Resp 18     Temp 99.6 F (37.6  C)     Temp Source Temporal     SpO2 100 %     Weight      Height      Head Circumference      Peak Flow      Pain Score 8     Pain Loc      Pain Edu?      Excl. in GC?    No data found.  Updated Vital Signs BP 129/78 (BP Location: Right Arm)   Pulse (!) 114   Temp 99.6 F (37.6 C) (Temporal)   Resp 18   LMP 08/15/2018   SpO2 100%   Visual Acuity Right Eye Distance:   Left Eye Distance:   Bilateral Distance:    Right Eye Near:   Left Eye Near:    Bilateral Near:     Physical Exam Vitals signs and nursing note reviewed.  Constitutional:      General: She is not in acute distress.    Appearance: Normal appearance. She is well-developed. She is not toxic-appearing.  HENT:     Head: Normocephalic and atraumatic.     Right Ear: Tympanic membrane and ear canal normal.     Left Ear: Tympanic membrane and ear canal normal.     Mouth/Throat:     Pharynx:  Oropharynx is clear. Posterior oropharyngeal erythema present.  Eyes:     Conjunctiva/sclera: Conjunctivae normal.  Neck:     Musculoskeletal: Neck supple.  Cardiovascular:     Rate and Rhythm: Normal rate and regular rhythm.     Heart sounds: No murmur.  Pulmonary:     Effort: Pulmonary effort is normal. No respiratory distress.     Breath sounds: Normal breath sounds.  Abdominal:     Palpations: Abdomen is soft.     Tenderness: There is no abdominal tenderness.  Musculoskeletal: Normal range of motion.  Skin:    General: Skin is warm and dry.  Neurological:     Mental Status: She is alert.  Psychiatric:        Mood and Affect: Mood normal.      UC Treatments / Results  Labs (all labs ordered are listed, but only abnormal results are displayed) Labs Reviewed - No data to display  EKG None  Radiology No results found.  Procedures Procedures (including critical care time)  Medications Ordered in UC Medications - No data to display  Initial Impression / Assessment and Plan / UC Course  I have reviewed the triage vital signs and the nursing notes.  Pertinent labs & imaging results that were available during my care of the patient were reviewed by me and considered in my medical decision making (see chart for details).     Symptoms consistent with flu like illness Tamiflu twice a day for 5 days Symptomatic treatment with OTC cough and congestion medication such as mucinex Tylenol/ibuprofen for the myalgias and fever.  Follow up as needed for continued or worsening symptoms  Final Clinical Impressions(s) / UC Diagnoses   Final diagnoses:  Influenza-like illness     Discharge Instructions     Most likely this is a flu like illness Tamiflu twice a day for the next 5 days Tylenol/ibuprofen for fever or body aches Mucinex for cough and congestion as needed every 12 hours Follow up as needed for continued or worsening symptoms     ED Prescriptions     Medication Sig Dispense Auth. Provider   oseltamivir (TAMIFLU) 75 MG capsule Take 1 capsule (  75 mg total) by mouth every 12 (twelve) hours. 10 capsule Janace Aris, NP     Controlled Substance Prescriptions [place] Controlled Substance Registry consulted? no   Janace Aris, NP 09/07/18 [phone]

## 2018-12-01 ENCOUNTER — Encounter: Payer: Self-pay | Admitting: *Deleted

## 2019-01-29 ENCOUNTER — Emergency Department (HOSPITAL_COMMUNITY): Payer: Self-pay

## 2019-01-29 ENCOUNTER — Emergency Department (HOSPITAL_COMMUNITY)
Admission: EM | Admit: 2019-01-29 | Discharge: 2019-01-29 | Disposition: A | Discharge status: Home / Self Care | Payer: Self-pay | Attending: Emergency Medicine | Admitting: Emergency Medicine

## 2019-01-29 ENCOUNTER — Encounter (HOSPITAL_COMMUNITY): Payer: Self-pay

## 2019-01-29 ENCOUNTER — Other Ambulatory Visit: Payer: Self-pay

## 2019-01-29 DIAGNOSIS — Y999 Unspecified external cause status: Secondary | ICD-10-CM | POA: Insufficient documentation

## 2019-01-29 DIAGNOSIS — Z3200 Encounter for pregnancy test, result unknown: Secondary | ICD-10-CM | POA: Insufficient documentation

## 2019-01-29 DIAGNOSIS — R0789 Other chest pain: Secondary | ICD-10-CM | POA: Insufficient documentation

## 2019-01-29 DIAGNOSIS — R112 Nausea with vomiting, unspecified: Secondary | ICD-10-CM | POA: Insufficient documentation

## 2019-01-29 DIAGNOSIS — Y929 Unspecified place or not applicable: Secondary | ICD-10-CM | POA: Insufficient documentation

## 2019-01-29 DIAGNOSIS — Y9389 Activity, other specified: Secondary | ICD-10-CM | POA: Insufficient documentation

## 2019-01-29 DIAGNOSIS — T189XXA Foreign body of alimentary tract, part unspecified, initial encounter: Secondary | ICD-10-CM | POA: Insufficient documentation

## 2019-01-29 DIAGNOSIS — R1012 Left upper quadrant pain: Secondary | ICD-10-CM | POA: Insufficient documentation

## 2019-01-29 DIAGNOSIS — X58XXXA Exposure to other specified factors, initial encounter: Secondary | ICD-10-CM | POA: Insufficient documentation

## 2019-01-29 DIAGNOSIS — R110 Nausea: Secondary | ICD-10-CM

## 2019-01-29 LAB — PREGNANCY, URINE: Preg Test, Ur: NEGATIVE

## 2019-01-29 MED ORDER — ONDANSETRON 4 MG PO TBDP
4.0000 mg | ORAL_TABLET | Freq: Once | ORAL | Status: AC
  Administered 2019-01-29: 4 mg via ORAL
  Filled 2019-01-29: qty 1

## 2019-01-29 MED ORDER — ONDANSETRON 4 MG PO TBDP: ORAL_TABLET | ORAL | 0 refills | Status: DC
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
## 2019-02-11 ENCOUNTER — Other Ambulatory Visit: Payer: Self-pay

## 2019-02-11 ENCOUNTER — Emergency Department (HOSPITAL_COMMUNITY)
Admission: EM | Admit: 2019-02-11 | Discharge: 2019-02-11 | Disposition: A | Discharge status: Home / Self Care | Payer: Self-pay | Attending: Emergency Medicine | Admitting: Emergency Medicine

## 2019-02-11 ENCOUNTER — Encounter (HOSPITAL_COMMUNITY): Payer: Self-pay | Admitting: Emergency Medicine

## 2019-02-11 DIAGNOSIS — R1012 Left upper quadrant pain: Secondary | ICD-10-CM | POA: Insufficient documentation

## 2019-02-11 DIAGNOSIS — R109 Unspecified abdominal pain: Secondary | ICD-10-CM

## 2019-02-11 LAB — POC URINE PREG, ED: Preg Test, Ur: NEGATIVE

## 2019-02-11 MED ORDER — ACETAMINOPHEN 325 MG PO TABS
650.0000 mg | ORAL_TABLET | Freq: Once | ORAL | Status: AC
  Administered 2019-02-11: 650 mg via ORAL
  Filled 2019-02-11: qty 2

## 2019-02-11 MED ORDER — CYCLOBENZAPRINE HCL 5 MG PO TABS: 5.0000 mg | ORAL_TABLET | Freq: Three times a day (TID) | ORAL | 0 refills | Status: DC | PRN

## 2019-02-11 MED ORDER — CYCLOBENZAPRINE HCL 10 MG PO TABS
5.0000 mg | ORAL_TABLET | Freq: Once | ORAL | Status: AC
  Administered 2019-02-11: 5 mg via ORAL
  Filled 2019-02-11: qty 1

## 2019-02-11 MED ORDER — IBUPROFEN 800 MG PO TABS: 800.0000 mg | ORAL_TABLET | Freq: Once | ORAL | Status: DC
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
## 2019-02-11 NOTE — ED Notes (Signed)
ED Provider at bedside. 

## 2019-02-11 NOTE — Discharge Instructions (Signed)
Take motrin for pain   Take flexeril for muscle spasms   You are likely going to be stiff and sore. Your pregnancy test is negative today  See your doctor  Return to ER if you have worse abdominal pain, vomiting, chest pain, headaches.
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
## 2019-02-24 ENCOUNTER — Emergency Department (HOSPITAL_COMMUNITY)
Admission: EM | Admit: 2019-02-24 | Discharge: 2019-02-25 | Disposition: A | Discharge status: Home / Self Care | Payer: Self-pay | Attending: Emergency Medicine | Admitting: Emergency Medicine

## 2019-02-24 ENCOUNTER — Other Ambulatory Visit: Payer: Self-pay

## 2019-02-24 ENCOUNTER — Encounter (HOSPITAL_COMMUNITY): Payer: Self-pay | Admitting: Emergency Medicine

## 2019-02-24 DIAGNOSIS — H5712 Ocular pain, left eye: Secondary | ICD-10-CM | POA: Insufficient documentation

## 2019-02-24 DIAGNOSIS — Z5321 Procedure and treatment not carried out due to patient leaving prior to being seen by health care provider: Secondary | ICD-10-CM | POA: Insufficient documentation
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
## 2019-02-25 NOTE — ED Notes (Signed)
Pt called for x4 and not seen in lobby. Labels found in lobby.

## 2019-05-16 ENCOUNTER — Other Ambulatory Visit: Payer: Self-pay

## 2019-05-16 ENCOUNTER — Encounter (HOSPITAL_COMMUNITY): Payer: Self-pay

## 2019-05-16 ENCOUNTER — Inpatient Hospital Stay (HOSPITAL_COMMUNITY)
Admission: AD | Admit: 2019-05-16 | Discharge: 2019-05-16 | Disposition: A | Discharge status: Home / Self Care | Payer: Self-pay | Attending: Family Medicine | Admitting: Family Medicine

## 2019-05-16 ENCOUNTER — Inpatient Hospital Stay (HOSPITAL_COMMUNITY): Payer: Self-pay

## 2019-05-16 DIAGNOSIS — R05 Cough: Secondary | ICD-10-CM | POA: Insufficient documentation

## 2019-05-16 DIAGNOSIS — Z20828 Contact with and (suspected) exposure to other viral communicable diseases: Secondary | ICD-10-CM | POA: Insufficient documentation

## 2019-05-16 DIAGNOSIS — Z3A10 10 weeks gestation of pregnancy: Secondary | ICD-10-CM

## 2019-05-16 DIAGNOSIS — R059 Cough, unspecified: Secondary | ICD-10-CM

## 2019-05-16 DIAGNOSIS — O26891 Other specified pregnancy related conditions, first trimester: Secondary | ICD-10-CM | POA: Insufficient documentation

## 2019-05-16 DIAGNOSIS — Z3491 Encounter for supervision of normal pregnancy, unspecified, first trimester: Secondary | ICD-10-CM

## 2019-05-16 DIAGNOSIS — Z3A01 Less than 8 weeks gestation of pregnancy: Secondary | ICD-10-CM | POA: Insufficient documentation

## 2019-05-16 DIAGNOSIS — W109XXA Fall (on) (from) unspecified stairs and steps, initial encounter: Secondary | ICD-10-CM | POA: Insufficient documentation

## 2019-05-16 DIAGNOSIS — R109 Unspecified abdominal pain: Secondary | ICD-10-CM | POA: Insufficient documentation

## 2019-05-16 LAB — CBC
HCT: 36 % (ref 36.0–46.0)
Hemoglobin: 11.9 g/dL — ABNORMAL LOW (ref 12.0–15.0)
MCH: 24.8 pg — ABNORMAL LOW (ref 26.0–34.0)
MCHC: 33.1 g/dL (ref 30.0–36.0)
MCV: 75 fL — ABNORMAL LOW (ref 80.0–100.0)
Platelets: 266 10*3/uL (ref 150–400)
RBC: 4.8 MIL/uL (ref 3.87–5.11)
RDW: 15.3 % (ref 11.5–15.5)
WBC: 6.7 10*3/uL (ref 4.0–10.5)
nRBC: 0 % (ref 0.0–0.2)

## 2019-05-16 LAB — URINALYSIS, ROUTINE W REFLEX MICROSCOPIC
Bilirubin Urine: NEGATIVE
Glucose, UA: NEGATIVE mg/dL
Hgb urine dipstick: NEGATIVE
Ketones, ur: NEGATIVE mg/dL
Leukocytes,Ua: NEGATIVE
Nitrite: NEGATIVE
Protein, ur: NEGATIVE mg/dL
Specific Gravity, Urine: 1.021 (ref 1.005–1.030)
pH: 7 (ref 5.0–8.0)

## 2019-05-16 LAB — SARS CORONAVIRUS 2 (TAT 6-24 HRS): SARS Coronavirus 2: NEGATIVE

## 2019-05-16 LAB — HCG, QUANTITATIVE, PREGNANCY: hCG, Beta Chain, Quant, S: 66308 m[IU]/mL — ABNORMAL HIGH (ref <5)

## 2019-05-16 LAB — POCT PREGNANCY, URINE: Preg Test, Ur: POSITIVE — AB

## 2019-05-16 MED ORDER — ALBUTEROL SULFATE HFA 108 (90 BASE) MCG/ACT IN AERS: 2.0000 | INHALATION_SPRAY | Freq: Four times a day (QID) | RESPIRATORY_TRACT | 0 refills | Status: DC | PRN

## 2019-05-16 NOTE — MAU Provider Note (Signed)
Chief Complaint: Abdominal Pain, Fall, Possible Pregnancy, and Cough   First Provider Initiated Contact with Patient 05/16/19 1656     SUBJECTIVE HPI: Julia Armstrong is a 28 y.o. [id number] at [redacted]w[redacted]d by unsure LMP who presents to [facility] reporting abdominal pain & cough.  States she fell down a flight of stair earlier today & landed on her abdomen. Has had generalized abdominal pain since then. Wants to make sure baby is ok. Did not hit her head or lose consciousness. Denies vaginal bleeding. Has had some n/v but that started prior to her fall.  Also reports cough for the last week & is requesting to be tested for covid. Illness started after her child was sick last week; she was not tested. Symptoms started with rhinorrhea & congestion. Since then has had a non productive cough with occasional SOB & wheezing. States she thinks she has asthma but hasn't ever been diagnosed. Denies fever/chills, sore throat, chest pain, or loss of sense of taste/smell.   Location: abdomen Quality: sore Severity: 7/10 on pain scale Duration: <1 day Timing:  constant Modifying factors: nothing makes better or worse Associated signs and symptoms: cough  Past Medical History:  Diagnosis Date  . Gallstones   . Wound infection after surgery 2016   c-section incision had to be reopened and wound vac placed   OB History  Gravida Para Term Preterm AB Living  7 4 4   2 4   SAB TAB Ectopic Multiple Live Births  2     0 4    # Outcome Date GA Lbr Len/2nd Weight Sex Delivery Anes PTL Lv  7 Current           6 Term 05/10/18 [redacted]w[redacted]d  3650 g F CS-LTranv Other  LIV     Birth Comments: cesection  5 SAB 01/25/17          4 Term 02/21/15    M CS-LTranv   LIV  3 Term 06/27/13    M CS-LTranv   LIV  2 Term 07/09/12    M CS-LTranv   LIV  1 SAB            Past Surgical History:  Procedure Laterality Date  . CESAREAN SECTION     C/S x 3  . CESAREAN SECTION N/A 05/10/2018   Procedure: REPEAT CESAREAN  SECTION;  Surgeon: Aletha Halim, MD;  Location: [facility];  Service: Obstetrics;  Laterality: N/A;  . DILATION AND EVACUATION N/A 02/18/2017   Procedure: DILATATION AND EVACUATION;  Surgeon: Osborne Oman, MD;  Location: [facility];  Service: Gynecology;  Laterality: N/A;  . WISDOM TOOTH EXTRACTION     Social History   Socioeconomic History  . Marital status: Single    Spouse name: Not on file  . Number of children: Not on file  . Years of education: Not on file  . Highest education level: Not on file  Occupational History  . Not on file  Social Needs  . Financial resource strain: Not hard at all  . Food insecurity    Worry: Never true    Inability: Never true  . Transportation needs    Medical: No    Non-medical: Not on file  Tobacco Use  . Smoking status: Never Smoker  . Smokeless tobacco: Never Used  Substance and Sexual Activity  . Alcohol use: Yes  . Drug use: No  . Sexual activity: Not Currently    Birth control/protection: None  Lifestyle  . Physical  activity    Days per week: Not on file    Minutes per session: Not on file  . Stress: Not at all  Relationships  . Social Musician on phone: Not on file    Gets together: Not on file    Attends religious service: Not on file    Active member of club or organization: Not on file    Attends meetings of clubs or organizations: Not on file    Relationship status: Not on file  . Intimate partner violence    Fear of current or ex partner: No    Emotionally abused: No    Physically abused: No    Forced sexual activity: No  Other Topics Concern  . Not on file  Social History Narrative  . Not on file   Family History  Problem Relation Age of Onset  . Cancer Other   . Cancer Maternal Grandmother        breast cancer   No current facility-administered medications on file prior to encounter.    Current Outpatient Medications on File Prior to Encounter  Medication Sig Dispense Refill  .  cyclobenzaprine (FLEXERIL) 5 MG tablet Take 1 tablet (5 mg total) by mouth 3 (three) times daily as needed for muscle spasms. 10 tablet 0  . ondansetron (ZOFRAN ODT) 4 MG disintegrating tablet  ODT q4 hours prn nausea/vomit 10 tablet 0   No Known Allergies  I have reviewed patient's Past Medical Hx, Surgical Hx, Family Hx, Social Hx, medications and allergies.   Review of Systems  Constitutional: Negative.   HENT: Negative for sore throat.   Respiratory: Positive for cough and wheezing. Negative for shortness of breath.   Cardiovascular: Negative for chest pain.  Gastrointestinal: Positive for abdominal pain, nausea and vomiting. Negative for constipation and diarrhea.  Genitourinary: Negative.   Neurological: Negative for syncope and headaches.    OBJECTIVE Patient Vitals for the past 24 hrs:  BP Temp Temp src Pulse Resp SpO2 Height Weight  05/16/19 1922 128/76 - - 76 - - - -  05/16/19 1621 129/76 98.3 F (36.8 C) Oral 71 16 100 % 5' 7.5" (1.715 m) 99.8 kg   Constitutional: Well-developed, well-nourished female in no acute distress.  Cardiovascular: normal rate & rhythm, no murmur Respiratory: normal rate and effort. Lung sounds clear throughout GI: Abd soft, non-tender, Pos BS x 4. No guarding or rebound tenderness MS: Extremities nontender, no edema, normal ROM Neurologic: Alert and oriented x 4.    LAB RESULTS Results for orders placed or performed during the hospital encounter of 05/16/19 (from the past 24 hour(s))  Pregnancy, urine POC     Status: Abnormal   Collection Time: 05/16/19  4:27 PM  Result Value Ref Range   Preg Test, Ur POSITIVE (A) NEGATIVE  Urinalysis, Routine w reflex microscopic     Status: Abnormal   Collection Time: 05/16/19  4:34 PM  Result Value Ref Range   Color, Urine YELLOW YELLOW   APPearance HAZY (A) CLEAR   Specific Gravity, Urine 1.021 1.005 - 1.030   pH 7.0 5.0 - 8.0   Glucose, UA NEGATIVE NEGATIVE mg/dL   Hgb urine dipstick NEGATIVE  NEGATIVE   Bilirubin Urine NEGATIVE NEGATIVE   Ketones, ur NEGATIVE NEGATIVE mg/dL   Protein, ur NEGATIVE NEGATIVE mg/dL   Nitrite NEGATIVE NEGATIVE   Leukocytes,Ua NEGATIVE NEGATIVE  CBC     Status: Abnormal   Collection Time: 05/16/19  5:44 PM  Result Value Ref  Range   WBC 6.7 4.0 - 10.5 K/uL   RBC 4.80 3.87 - 5.11 MIL/uL   Hemoglobin 11.9 (L) 12.0 - 15.0 g/dL   HCT 40.936.0 81.136.0 - 91.446.0 %   MCV 75.0 (L) 80.0 - 100.0 fL   MCH 24.8 (L) 26.0 - 34.0 pg   MCHC 33.1 30.0 - 36.0 g/dL   RDW 78.215.3 95.611.5 - 21.315.5 %   Platelets 266 150 - 400 K/uL   nRBC 0.0 0.0 - 0.2 %  hCG, quantitative, pregnancy     Status: Abnormal   Collection Time: 05/16/19  5:44 PM  Result Value Ref Range   hCG, Beta Chain, Quant, S 66,308 (H) <5 mIU/mL    IMAGING [place] Ob Less Than 14 Weeks With Ob Transvaginal  Result Date: 05/16/2019 CLINICAL DATA:  First trimester pregnancy. Unsure of LMP. Fell down steps. Abdominal and pelvic pain. Initial encounter. EXAM: OBSTETRIC <14 WK US AND TRANSVAGINAL OB US TECHNIQUE: Both transabdominal and transvaginal ultrasound examinations were performed for complete evaluation of the gestation as well as the maternal uterus, adnexal regions, and pelvic cul-de-sac. Transvaginal technique was performed to assess early pregnancy. COMPARISON:  None. FINDINGS: Intrauterine gestational sac: Single Yolk sac:  Not Visualized. Embryo:  Visualized. Cardiac Activity: Visualized. Heart Rate: 132 bpm CRL:  11 mm   7 w   1 d                  US EDC: 01/01/2020 Subchorionic hemorrhage:  None visualized. Maternal uterus/adnexae: Retroflexed uterus noted. Both ovaries are normal in appearance. No mass or abnormal free fluid identified. IMPRESSION: Single living IUP measuring 7 weeks 1 day, with US EDC of 01/01/2020. Retroflexed uterus noted. Electronically Signed   By: Danae OrleansJohn A Stahl M.D.   On: 05/16/2019 18:58    MAU COURSE Orders Placed This Encounter  Procedures  . SARS CORONAVIRUS 2 (TAT 6-24 HRS)  Nasopharyngeal Nasopharyngeal Swab  . US OB LESS THAN 14 WEEKS WITH OB TRANSVAGINAL  . Urinalysis, Routine w reflex microscopic  . CBC  . hCG, quantitative, pregnancy  . Airborne and Contact precautions  . Pregnancy, urine POC  . Discharge patient   Meds ordered this encounter  Medications  . albuterol (VENTOLIN HFA) 108 (90 Base) MCG/ACT inhaler    Sig: Inhale 2 puffs into the lungs every 6 (six) hours as needed for wheezing or shortness of breath.    Dispense:  8 g    Refill:  0    Order Specific Question:   Supervising Provider    Answer:   Jaynie CollinsANYANWU, UGONNA A [[id number]]    MDM Current pregnancy has not yet been evaluated. Ultrasound performed & shows live IUP measuring 1867w1d, EDD updated.  Discussed with patient her abdomen will likely be sore for the next few days. Encouraged to follow up with ED if symptoms worsen.   Lung sounds clear throughout. SpO2 100%. COVID swab pending. Discussed self quarantine until results come back. Will rx albuterol prn for wheezing symptoms. Given list of OTC meds safe in pregnancy. Present to ED for worsening symptoms.   ASSESSMENT 1. Abdominal pain during pregnancy in first trimester   2. Normal IUP (intrauterine pregnancy) on prenatal ultrasound, first trimester   3. [redacted] weeks gestation of pregnancy   4. Cough     PLAN Discharge home in stable condition. Discussed reasons to return to MAU vs ED Rx albuterol prn COVID swab pending Start prenatal care  Follow-up Information    [facility]  Follow up.   Specialty: Emergency Medicine Why: return for worsening respiratory symptoms or upper abdominal pain  Contact information: [address] [id number] mc [place] [address] [phone]       [facility] Follow up.   Specialty: Obstetrics and Gynecology Why: Call office to schedule prenatal care Contact information: [address],  [address] [id number] mc [place] [address] [phone]         Allergies as of 05/16/2019   No Known Allergies     Medication List    STOP taking these medications   oseltamivir 75 MG capsule Commonly known as: TAMIFLU     TAKE these medications   albuterol 108 (90 Base) MCG/ACT inhaler Commonly known as: VENTOLIN HFA Inhale 2 puffs into the lungs every 6 (six) hours as needed for wheezing or shortness of breath.   cyclobenzaprine 5 MG tablet Commonly known as: FLEXERIL Take 1 tablet (5 mg total) by mouth 3 (three) times daily as needed for muscle spasms.   ondansetron 4 MG disintegrating tablet Commonly known as: Zofran ODT 4mg  ODT q4 hours prn nausea/vomit        , NP 05/16/2019  7:29 PM
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
## 2019-05-16 NOTE — Discharge Instructions (Signed)
Person Under Monitoring Name: Julia Armstrong  Location: [address] [place] [address]   Infection Prevention Recommendations for Individuals Confirmed to have, or Being Evaluated for, 2019 Novel Coronavirus (COVID-19) Infection Who Receive Care at Home  Individuals who are confirmed to have, or are being evaluated for, COVID-19 should follow the prevention steps below until a healthcare provider or local or state health department says they can return to normal activities.  Stay home except to get medical care You should restrict activities outside your home, except for getting medical care. Do not go to work, school, or public areas, and do not use public transportation or taxis.  Call ahead before visiting your doctor Before your medical appointment, call the healthcare provider and tell them that you have, or are being evaluated for, COVID-19 infection. This will help the healthcare providers office take steps to keep other people from getting infected. Ask your healthcare provider to call the local or state health department.  Monitor your symptoms Seek prompt medical attention if your illness is worsening (e.g., difficulty breathing). Before going to your medical appointment, call the healthcare provider and tell them that you have, or are being evaluated for, COVID-19 infection. Ask your healthcare provider to call the local or state health department.  Wear a facemask You should wear a facemask that covers your nose and mouth when you are in the same room with other people and when you visit a healthcare provider. People who live with or visit you should also wear a facemask while they are in the same room with you.  Separate yourself from other people in your home As much as possible, you should stay in a different room from other people in your home. Also, you should use a separate bathroom, if available.  Avoid sharing household items You should not  share dishes, drinking glasses, cups, eating utensils, towels, bedding, or other items with other people in your home. After using these items, you should wash them thoroughly with soap and water.  Cover your coughs and sneezes Cover your mouth and nose with a tissue when you cough or sneeze, or you can cough or sneeze into your sleeve. Throw used tissues in a lined trash can, and immediately wash your hands with soap and water for at least 20 seconds or use an alcohol-based hand rub.  Wash your [facility] your hands often and thoroughly with soap and water for at least 20 seconds. You can use an alcohol-based hand sanitizer if soap and water are not available and if your hands are not visibly dirty. Avoid touching your eyes, nose, and mouth with unwashed hands.   Prevention Steps for Caregivers and Household Members of Individuals Confirmed to have, or Being Evaluated for, COVID-19 Infection Being Cared for in the Home  If you live with, or provide care at home for, a person confirmed to have, or being evaluated for, COVID-19 infection please follow these guidelines to prevent infection:  Follow healthcare providers instructions Make sure that you understand and can help the patient follow any healthcare provider instructions for all care.  Provide for the patients basic needs You should help the patient with basic needs in the home and provide support for getting groceries, prescriptions, and other personal needs.  Monitor the patients symptoms If they are getting sicker, call his or her medical provider and tell them that the patient has, or is being evaluated for, COVID-19 infection. This will help the healthcare providers office  take steps to keep other people from getting infected. Ask the healthcare provider to call the local or state health department.  Limit the number of people who have contact with the patient  If possible, have only one caregiver for the  patient.  Other household members should stay in another home or place of residence. If this is not possible, they should stay  in another room, or be separated from the patient as much as possible. Use a separate bathroom, if available.  Restrict visitors who do not have an essential need to be in the home.  Keep older adults, very young children, and other sick people away from the patient Keep older adults, very young children, and those who have compromised immune systems or chronic health conditions away from the patient. This includes people with chronic heart, lung, or kidney conditions, diabetes, and cancer.  Ensure good ventilation Make sure that shared spaces in the home have good air flow, such as from an air conditioner or an opened window, weather permitting.  Wash your hands often  Wash your hands often and thoroughly with soap and water for at least 20 seconds. You can use an alcohol based hand sanitizer if soap and water are not available and if your hands are not visibly dirty.  Avoid touching your eyes, nose, and mouth with unwashed hands.  Use disposable paper towels to dry your hands. If not available, use dedicated cloth towels and replace them when they become wet.  Wear a facemask and gloves  Wear a disposable facemask at all times in the room and gloves when you touch or have contact with the patients blood, body fluids, and/or secretions or excretions, such as sweat, saliva, sputum, nasal mucus, vomit, urine, or feces.  Ensure the mask fits over your nose and mouth tightly, and do not touch it during use.  Throw out disposable facemasks and gloves after using them. Do not reuse.  Wash your hands immediately after removing your facemask and gloves.  If your personal clothing becomes contaminated, carefully remove clothing and launder. Wash your hands after handling contaminated clothing.  Place all used disposable facemasks, gloves, and other waste in a lined  container before disposing them with other household waste.  Remove gloves and wash your hands immediately after handling these items.  Do not share dishes, glasses, or other household items with the patient  Avoid sharing household items. You should not share dishes, drinking glasses, cups, eating utensils, towels, bedding, or other items with a patient who is confirmed to have, or being evaluated for, COVID-19 infection.  After the person uses these items, you should wash them thoroughly with soap and water.  Wash laundry thoroughly  Immediately remove and wash clothes or bedding that have blood, body fluids, and/or secretions or excretions, such as sweat, saliva, sputum, nasal mucus, vomit, urine, or feces, on them.  Wear gloves when handling laundry from the patient.  Read and follow directions on labels of laundry or clothing items and detergent. In general, wash and dry with the warmest temperatures recommended on the label.  Clean all areas the individual has used often  Clean all touchable surfaces, such as counters, tabletops, doorknobs, bathroom fixtures, toilets, phones, keyboards, tablets, and bedside tables, every day. Also, clean any surfaces that may have blood, body fluids, and/or secretions or excretions on them.  Wear gloves when cleaning surfaces the patient has come in contact with.  Use a diluted bleach solution (e.g., dilute bleach with 1 part  bleach and 10 parts water) or a household disinfectant with a label that says EPA-registered for coronaviruses. To make a bleach solution at home, add 1 tablespoon of bleach to 1 quart (4 cups) of water. For a larger supply, add  cup of bleach to 1 gallon (16 cups) of water.  Read labels of cleaning products and follow recommendations provided on product labels. Labels contain instructions for safe and effective use of the cleaning product including precautions you should take when applying the product, such as wearing gloves or  eye protection and making sure you have good ventilation during use of the product.  Remove gloves and wash hands immediately after cleaning.  Monitor yourself for signs and symptoms of illness Caregivers and household members are considered close contacts, should monitor their health, and will be asked to limit movement outside of the home to the extent possible. Follow the monitoring steps for close contacts listed on the symptom monitoring form.   ? If you have additional questions, contact your local health department or call the epidemiologist on call at [phone] (available 24/7). ? This guidance is subject to change. For the most up-to-date guidance from CDC, please refer to their website: [employer]Met[employer].hu    Safe Medications in Pregnancy   Acne: Benzoyl Peroxide Salicylic Acid  Backache/Headache: Tylenol: 2 regular strength every 4 hours OR              2 Extra strength every 6 hours  Colds/Coughs/Allergies: Benadryl (alcohol free) 25 mg every 6 hours as needed Breath right strips Claritin Cepacol throat lozenges Chloraseptic throat spray Cold-Eeze- up to three times per day Cough drops, alcohol free Flonase (by prescription only) Guaifenesin Mucinex Robitussin DM (plain only, alcohol free) Saline nasal spray/drops Sudafed (pseudoephedrine) & Actifed ** use only after [redacted] weeks gestation and if you do not have high blood pressure Tylenol Vicks Vaporub Zinc lozenges Zyrtec   Constipation: Colace Ducolax suppositories Fleet enema Glycerin suppositories Metamucil Milk of magnesia Miralax Senokot Smooth move tea  Diarrhea: Kaopectate Imodium A-D  *NO pepto Bismol  Hemorrhoids: Anusol Anusol HC Preparation H Tucks  Indigestion: Tums Maalox Mylanta Zantac  Pepcid  Insomnia: Benadryl (alcohol free) 25mg  every 6 hours as needed Tylenol PM Unisom, no Gelcaps  Leg  Cramps: Tums MagGel  Nausea/Vomiting:  Bonine Dramamine Emetrol Ginger extract Sea bands Meclizine  Nausea medication to take during pregnancy:  Unisom (doxylamine succinate 25 mg tablets) Take one tablet daily at bedtime. If symptoms are not adequately controlled, the dose can be increased to a maximum recommended dose of two tablets daily (1/2 tablet in the morning, 1/2 tablet mid-afternoon and one at bedtime). Vitamin B6 100mg  tablets. Take one tablet twice a day (up to 200 mg per day).  Skin Rashes: Aveeno products Benadryl cream or 25mg  every 6 hours as needed Calamine Lotion 1% cortisone cream  Yeast infection: Gyne-lotrimin 7 Monistat 7  Gum/tooth pain: Anbesol  **If taking multiple medications, please check labels to avoid duplicating the same active ingredients **take medication as directed on the label ** Do not exceed 4000 mg of tylenol in 24 hours **Do not take medications that contain aspirin or ibuprofen

## 2019-12-15 ENCOUNTER — Other Ambulatory Visit: Payer: Self-pay

## 2019-12-15 ENCOUNTER — Ambulatory Visit (HOSPITAL_COMMUNITY)
Admission: EM | Admit: 2019-12-15 | Discharge: 2019-12-15 | Disposition: A | Discharge status: Home / Self Care | Payer: Medicaid Other | Attending: Internal Medicine | Admitting: Internal Medicine

## 2019-12-15 ENCOUNTER — Encounter (HOSPITAL_COMMUNITY): Payer: Self-pay

## 2019-12-15 DIAGNOSIS — H10212 Acute toxic conjunctivitis, left eye: Secondary | ICD-10-CM

## 2019-12-15 MED ORDER — ALBUTEROL SULFATE HFA 108 (90 BASE) MCG/ACT IN AERS: 2.0000 | INHALATION_SPRAY | Freq: Four times a day (QID) | RESPIRATORY_TRACT | 0 refills | Status: DC | PRN

## 2019-12-15 MED ORDER — KETOROLAC TROMETHAMINE 0.5 % OP SOLN: 1.0000 [drp] | Freq: Four times a day (QID) | OPHTHALMIC | 0 refills | Status: DC | PRN

## 2019-12-15 MED ORDER — TETRACAINE HCL 0.5 % OP SOLN
OPHTHALMIC | Status: AC
  Filled 2019-12-15: qty 4

## 2019-12-15 MED ORDER — ERYTHROMYCIN 5 MG/GM OP OINT: TOPICAL_OINTMENT | OPHTHALMIC | 0 refills | Status: DC
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
## 2020-02-16 ENCOUNTER — Encounter (HOSPITAL_COMMUNITY): Payer: Self-pay | Admitting: Emergency Medicine

## 2020-02-16 ENCOUNTER — Emergency Department (HOSPITAL_COMMUNITY)
Admission: EM | Admit: 2020-02-16 | Discharge: 2020-02-16 | Disposition: A | Discharge status: Home / Self Care | Payer: Medicaid Other | Attending: Emergency Medicine | Admitting: Emergency Medicine

## 2020-02-16 DIAGNOSIS — M791 Myalgia, unspecified site: Secondary | ICD-10-CM | POA: Insufficient documentation

## 2020-02-16 DIAGNOSIS — R1110 Vomiting, unspecified: Secondary | ICD-10-CM | POA: Diagnosis not present

## 2020-02-16 DIAGNOSIS — Z5321 Procedure and treatment not carried out due to patient leaving prior to being seen by health care provider: Secondary | ICD-10-CM | POA: Diagnosis not present

## 2020-02-16 LAB — COMPREHENSIVE METABOLIC PANEL
ALT: 18 U/L (ref 0–44)
AST: 18 U/L (ref 15–41)
Albumin: 3.7 g/dL (ref 3.5–5.0)
Alkaline Phosphatase: 61 U/L (ref 38–126)
Anion gap: 11 (ref 5–15)
BUN: 7 mg/dL (ref 6–20)
CO2: 26 mmol/L (ref 22–32)
Calcium: 8.4 mg/dL — ABNORMAL LOW (ref 8.9–10.3)
Chloride: 101 mmol/L (ref 98–111)
Creatinine, Ser: 0.65 mg/dL (ref 0.44–1.00)
GFR calc Af Amer: >60 mL/min (ref >60)
GFR calc non Af Amer: >60 mL/min (ref >60)
Glucose, Bld: 95 mg/dL (ref 70–99)
Potassium: 3.4 mmol/L — ABNORMAL LOW (ref 3.5–5.1)
Sodium: 138 mmol/L (ref 135–145)
Total Bilirubin: 0.3 mg/dL (ref 0.3–1.2)
Total Protein: 7.9 g/dL (ref 6.5–8.1)

## 2020-02-16 LAB — CBC
HCT: 42.4 % (ref 36.0–46.0)
Hemoglobin: 13 g/dL (ref 12.0–15.0)
MCH: 23.7 pg — ABNORMAL LOW (ref 26.0–34.0)
MCHC: 30.7 g/dL (ref 30.0–36.0)
MCV: 77.2 fL — ABNORMAL LOW (ref 80.0–100.0)
Platelets: 266 10*3/uL (ref 150–400)
RBC: 5.49 MIL/uL — ABNORMAL HIGH (ref 3.87–5.11)
RDW: 15.5 % (ref 11.5–15.5)
WBC: 3.7 10*3/uL — ABNORMAL LOW (ref 4.0–10.5)
nRBC: 0 % (ref 0.0–0.2)

## 2020-02-16 LAB — URINALYSIS, ROUTINE W REFLEX MICROSCOPIC
Bilirubin Urine: NEGATIVE
Glucose, UA: NEGATIVE mg/dL
Hgb urine dipstick: NEGATIVE
Ketones, ur: NEGATIVE mg/dL
Leukocytes,Ua: NEGATIVE
Nitrite: NEGATIVE
Protein, ur: NEGATIVE mg/dL
Specific Gravity, Urine: 1.025 (ref 1.005–1.030)
pH: 6 (ref 5.0–8.0)

## 2020-02-16 LAB — I-STAT BETA HCG BLOOD, ED (MC, WL, AP ONLY): I-stat hCG, quantitative: <5 m[IU]/mL (ref <5)

## 2020-02-16 LAB — LIPASE, BLOOD: Lipase: 30 U/L (ref 11–51)

## 2020-02-16 NOTE — ED Triage Notes (Signed)
Patient reports emesis and body aches x2 days. Denies diarrhea. Afebrile in triage.

## 2020-02-16 NOTE — ED Notes (Signed)
Per registration, patient turned in labels and reports she is leaving. 

## 2020-03-05 ENCOUNTER — Other Ambulatory Visit: Payer: Self-pay

## 2020-03-05 ENCOUNTER — Other Ambulatory Visit: Payer: Medicaid Other

## 2020-03-05 ENCOUNTER — Inpatient Hospital Stay (HOSPITAL_COMMUNITY)
Admission: AD | Admit: 2020-03-05 | Discharge: 2020-03-05 | Disposition: A | Discharge status: Home / Self Care | Payer: Medicaid Other | Attending: Obstetrics and Gynecology | Admitting: Obstetrics and Gynecology

## 2020-03-05 ENCOUNTER — Telehealth: Payer: Self-pay | Admitting: Obstetrics and Gynecology

## 2020-03-05 ENCOUNTER — Encounter (HOSPITAL_COMMUNITY): Payer: Self-pay | Admitting: Obstetrics and Gynecology

## 2020-03-05 ENCOUNTER — Inpatient Hospital Stay (HOSPITAL_COMMUNITY): Payer: Medicaid Other

## 2020-03-05 DIAGNOSIS — O3481 Maternal care for other abnormalities of pelvic organs, first trimester: Secondary | ICD-10-CM | POA: Insufficient documentation

## 2020-03-05 DIAGNOSIS — O219 Vomiting of pregnancy, unspecified: Secondary | ICD-10-CM | POA: Diagnosis present

## 2020-03-05 DIAGNOSIS — N83202 Unspecified ovarian cyst, left side: Secondary | ICD-10-CM | POA: Insufficient documentation

## 2020-03-05 DIAGNOSIS — O26851 Spotting complicating pregnancy, first trimester: Secondary | ICD-10-CM

## 2020-03-05 DIAGNOSIS — R519 Headache, unspecified: Secondary | ICD-10-CM | POA: Diagnosis not present

## 2020-03-05 DIAGNOSIS — O3680X0 Pregnancy with inconclusive fetal viability, not applicable or unspecified: Secondary | ICD-10-CM | POA: Diagnosis not present

## 2020-03-05 DIAGNOSIS — Z3A01 Less than 8 weeks gestation of pregnancy: Secondary | ICD-10-CM | POA: Diagnosis not present

## 2020-03-05 DIAGNOSIS — R109 Unspecified abdominal pain: Secondary | ICD-10-CM

## 2020-03-05 DIAGNOSIS — O26891 Other specified pregnancy related conditions, first trimester: Secondary | ICD-10-CM | POA: Diagnosis not present

## 2020-03-05 DIAGNOSIS — Z79899 Other long term (current) drug therapy: Secondary | ICD-10-CM | POA: Diagnosis not present

## 2020-03-05 LAB — COMPREHENSIVE METABOLIC PANEL
ALT: 15 U/L (ref 0–44)
AST: 15 U/L (ref 15–41)
Albumin: 3.2 g/dL — ABNORMAL LOW (ref 3.5–5.0)
Alkaline Phosphatase: 48 U/L (ref 38–126)
Anion gap: 10 (ref 5–15)
BUN: 10 mg/dL (ref 6–20)
CO2: 24 mmol/L (ref 22–32)
Calcium: 8.6 mg/dL — ABNORMAL LOW (ref 8.9–10.3)
Chloride: 104 mmol/L (ref 98–111)
Creatinine, Ser: 0.67 mg/dL (ref 0.44–1.00)
GFR calc Af Amer: >60 mL/min (ref >60)
GFR calc non Af Amer: >60 mL/min (ref >60)
Glucose, Bld: 111 mg/dL — ABNORMAL HIGH (ref 70–99)
Potassium: 3.8 mmol/L (ref 3.5–5.1)
Sodium: 138 mmol/L (ref 135–145)
Total Bilirubin: 0.3 mg/dL (ref 0.3–1.2)
Total Protein: 6.7 g/dL (ref 6.5–8.1)

## 2020-03-05 LAB — URINALYSIS, ROUTINE W REFLEX MICROSCOPIC
Bilirubin Urine: NEGATIVE
Glucose, UA: NEGATIVE mg/dL
Hgb urine dipstick: NEGATIVE
Ketones, ur: NEGATIVE mg/dL
Leukocytes,Ua: NEGATIVE
Nitrite: NEGATIVE
Protein, ur: NEGATIVE mg/dL
Specific Gravity, Urine: 1.018 (ref 1.005–1.030)
pH: 5 (ref 5.0–8.0)

## 2020-03-05 LAB — WET PREP, GENITAL
Sperm: NONE SEEN
Trich, Wet Prep: NONE SEEN
Yeast Wet Prep HPF POC: NONE SEEN

## 2020-03-05 LAB — CBC
HCT: 37 % (ref 36.0–46.0)
Hemoglobin: 11.7 g/dL — ABNORMAL LOW (ref 12.0–15.0)
MCH: 24 pg — ABNORMAL LOW (ref 26.0–34.0)
MCHC: 31.6 g/dL (ref 30.0–36.0)
MCV: 76 fL — ABNORMAL LOW (ref 80.0–100.0)
Platelets: 256 10*3/uL (ref 150–400)
RBC: 4.87 MIL/uL (ref 3.87–5.11)
RDW: 16 % — ABNORMAL HIGH (ref 11.5–15.5)
WBC: 6.2 10*3/uL (ref 4.0–10.5)
nRBC: 0 % (ref 0.0–0.2)

## 2020-03-05 LAB — POCT PREGNANCY, URINE: Preg Test, Ur: POSITIVE — AB

## 2020-03-05 LAB — HCG, QUANTITATIVE, PREGNANCY: hCG, Beta Chain, Quant, S: 368 m[IU]/mL — ABNORMAL HIGH (ref <5)

## 2020-03-05 MED ORDER — METOCLOPRAMIDE HCL 10 MG PO TABS
10.0000 mg | ORAL_TABLET | Freq: Once | ORAL | Status: AC
  Administered 2020-03-05: 10 mg via ORAL
  Filled 2020-03-05: qty 1

## 2020-03-05 MED ORDER — ACETAMINOPHEN 325 MG PO TABS
650.0000 mg | ORAL_TABLET | Freq: Once | ORAL | Status: AC
  Administered 2020-03-05: 650 mg via ORAL
  Filled 2020-03-05: qty 2

## 2020-03-05 MED ORDER — METOCLOPRAMIDE HCL 10 MG PO TABS
10.0000 mg | ORAL_TABLET | Freq: Three times a day (TID) | ORAL | 0 refills | Status: DC | PRN
Start: 2020-03-05 — End: 2020-03-16

## 2020-03-05 MED ORDER — PRENATAL VITAMIN 27-0.8 MG PO TABS: 1.0000 | ORAL_TABLET | Freq: Every day | ORAL | 3 refills | Status: DC

## 2020-03-05 NOTE — Discharge Instructions (Signed)
You were seen today for concern of headaches, nausea, and abdominal cramping. Your pregnancy test was positive and your beta-hcg hormone level also indicated an early pregnancy. However, given how early you are in pregnancy, the ultrasound was unable to determine the location of your pregnancy.  It will be important to return to the Maternity Assessment Unit in 2 days for a repeat beta-hcg blood test. We will continue to trend your levels until the location of your pregnancy can be confirmed.  You may take reglan as needed for nausea and tylenol as needed for abdominal cramping. We also recommend starting a daily prenatal vitamin.   Please call to establish care at a prenatal clinic as noted below.  Morning Sickness  Morning sickness is when you feel sick to your stomach (nauseous) during pregnancy. You may feel sick to your stomach and throw up (vomit). You may feel sick in the morning, but you can feel this way at any time of day. Some women feel very sick to their stomach and cannot stop throwing up (hyperemesis gravidarum). Follow these instructions at home: Medicines  Take over-the-counter and prescription medicines only as told by your doctor. Do not take any medicines until you talk with your doctor about them first.  Taking multivitamins before getting pregnant can stop or lessen the harshness of morning sickness. Eating and drinking  Eat dry toast or crackers before getting out of bed.  Eat 5 or 6 small meals a day.  Eat dry and bland foods like rice and baked potatoes.  Do not eat greasy, fatty, or spicy foods.  Have someone cook for you if the smell of food causes you to feel sick or throw up.  If you feel sick to your stomach after taking prenatal vitamins, take them at night or with a snack.  Eat protein when you need a snack. Nuts, yogurt, and cheese are good choices.  Drink fluids throughout the day.  Try ginger ale made with real ginger, ginger tea made from fresh  grated ginger, or ginger candies. General instructions  Do not use any products that have nicotine or tobacco in them, such as cigarettes and e-cigarettes. If you need help quitting, ask your doctor.  Use an air purifier to keep the air in your house free of smells.  Get lots of fresh air.  Try to avoid smells that make you feel sick.  Try: ? Wearing a bracelet that is used for seasickness (acupressure wristband). ? Going to a doctor who puts thin needles into certain body points (acupuncture) to improve how you feel. Contact a doctor if:  You need medicine to feel better.  You feel dizzy or light-headed.  You are losing weight. Get help right away if:  You feel very sick to your stomach and cannot stop throwing up.  You pass out (faint).  You have very bad pain in your belly. Summary  Morning sickness is when you feel sick to your stomach (nauseous) during pregnancy.  You may feel sick in the morning, but you can feel this way at any time of day.  Making some changes to what you eat may help your symptoms go away. This information is not intended to replace advice given to you by your health care provider. Make sure you discuss any questions you have with your health care provider. Document Revised: 06/26/2017 Document Reviewed: 08/14/2016 Elsevier Patient Education  2020 [employer].     [facility] for [facility] Providers  Center for [facility] locations:  Hours may vary. Please call for an appointment  Center for [facility] @ [place]  [address] [place]  [phone]  Center for [facility] @ [facility]   [address]  [phone]  [facility] For [facility] @ [facility]       [address] [phone]            Center for [facility] @ [place]     [phone]          Center for [facility] Healthcare @ [facility]   [address] [phone]  Center for [facility] @ [facility]  [address] [phone]     Center for [facility] @ [facility]  [address]   [phone]

## 2020-03-05 NOTE — Telephone Encounter (Signed)
Attempted to call pt regarding clinic appointment scheduled for Wed, Aug 11th at 1:30PM for repeat beta-hcg. Given voicemail box NOT setup, sent message to pt's MyChart.

## 2020-03-05 NOTE — MAU Note (Signed)
Julia Armstrong is a 29 y.o. at [redacted]w[redacted]d here in MAU reporting: been having nausea, headache, and feeling lightheaded for 2 months. Is currently having nausea and lightheadedness. Having some light cramping but no vaginal bleeding. No abnormal discharge. Had + UPT last night.  LMP: 02/03/20  Onset of complaint: ongoing  Pain score: 5/10  Vitals:   03/05/20 1020  BP: 110/79  Pulse: 80  Resp: 16  Temp: 98.6 F (37 C)  SpO2: 99%     Lab orders placed from triage: UA, UPT

## 2020-03-05 NOTE — MAU Provider Note (Signed)
Chief Complaint: Abdominal Pain, Nausea, and Dizziness   First Provider Initiated Contact with Patient 03/05/20 1028    SUBJECTIVE HPI: Julia Armstrong is a 29 y.o. [id number] at [redacted]w[redacted]d by LMP who presents to maternity admissions reporting concern of worsening nausea, headache, and abdominal cramping s/p a positive home pregnancy test last night. Pt states that for the past several months she has felt fatigued, nausea and lightheaded but came into MAU today given worsening symptoms. HA described as "tension across her forehead" with intermittent photophobia but no blurred vision. Low abdominal cramping described as constant and "stronger than a period". She denies taking any medications for symptoms prior to coming into MAU today. She reports monthly periods but not always a 28-day cycle. She also reports going into the hospital on 7/22 given concern of emesis and body aches; triage note visible in Epic but no provider note available. No reported sick contacts. H/o CS x4 (first for NRFHT and then subsequent 3 all scheduled elective procedures). No prior issues with blood pressure. Denies tobacco and other substance use. No safety concerns.  She denies vaginal bleeding, vaginal itching/burning, urinary symptoms, constipation, diarrhea, dizziness, cough, sore throat, or fever/chills.  HPI  Past Medical History:  Diagnosis Date  . Gallstones   . Wound infection after surgery 2016   c-section incision had to be reopened and wound vac placed   Past Surgical History:  Procedure Laterality Date  . CESAREAN SECTION     C/S x 3  . CESAREAN SECTION N/A 05/10/2018   Procedure: REPEAT CESAREAN SECTION;  Surgeon: Annandale Bing, MD;  Location: [facility] BIRTHING SUITES;  Service: Obstetrics;  Laterality: N/A;  . DILATION AND EVACUATION N/A 02/18/2017   Procedure: DILATATION AND EVACUATION;  Surgeon: Tereso Newcomer, MD;  Location: [facility] ORS;  Service: Gynecology;  Laterality: N/A;  . WISDOM TOOTH EXTRACTION      Social History   Socioeconomic History  . Marital status: Significant Other    Spouse name: Not on file  . Number of children: Not on file  . Years of education: Not on file  . Highest education level: Not on file  Occupational History  . Not on file  Tobacco Use  . Smoking status: Never Smoker  . Smokeless tobacco: Never Used  Vaping Use  . Vaping Use: Never used  Substance and Sexual Activity  . Alcohol use: Not Currently  . Drug use: No  . Sexual activity: Yes    Birth control/protection: None  Other Topics Concern  . Not on file  Social History Narrative  . Not on file   Social Determinants of Health   Financial Resource Strain:   . Difficulty of Paying Living Expenses:   Food Insecurity:   . Worried About Programme researcher, broadcasting/film/video in the Last Year:   . Barista in the Last Year:   Transportation Needs:   . Freight forwarder (Medical):   [name] Lack of Transportation (Non-Medical):   Physical Activity:   . Days of Exercise per Week:   . Minutes of Exercise per Session:   Stress:   . Feeling of Stress :   Social Connections:   . Frequency of Communication with Friends and Family:   . Frequency of Social Gatherings with Friends and Family:   . Attends Religious Services:   . Active Member of Clubs or Organizations:   . Attends Banker Meetings:   [name] Marital Status:   Intimate Partner Violence:   . Fear  of Current or Ex-Partner:   . Emotionally Abused:   [name]. Physically Abused:   . Sexually Abused:    No current facility-administered medications on file prior to encounter.   Current Outpatient Medications on File Prior to Encounter  Medication Sig Dispense Refill  . albuterol (VENTOLIN HFA) 108 (90 Base) MCG/ACT inhaler Inhale 2 puffs into the lungs every 6 (six) hours as needed for wheezing or shortness of breath. 18 g 0   No Known Allergies  ROS:  Review of Systems  Constitutional: Positive for fatigue. Negative for activity change,  appetite change, chills and fever.  HENT: Negative for congestion and sore throat.   Eyes: Positive for photophobia. Negative for visual disturbance.  Respiratory: Negative for cough, chest tightness and shortness of breath.   Cardiovascular: Negative for chest pain.  Gastrointestinal: Positive for abdominal pain, nausea and vomiting. Negative for blood in stool, constipation and diarrhea.  Genitourinary: Negative for dyspareunia, dysuria, frequency, hematuria, vaginal bleeding, vaginal discharge and vaginal pain.  Musculoskeletal: Negative for back pain.  Neurological: Positive for light-headedness. Negative for dizziness and weakness.  Psychiatric/Behavioral: Negative for dysphoric mood and suicidal ideas.   I have reviewed patient's Past Medical Hx, Surgical Hx, Family Hx, Social Hx, medications and allergies.   Physical Exam   Patient Vitals for the past 24 hrs:  BP Temp Temp src Pulse Resp SpO2 Height Weight  03/05/20 1213 111/69 -- -- 80 -- -- -- --  03/05/20 1020 110/79 98.6 F (37 C) Oral 80 16 99 % -- --  03/05/20 1015 -- -- -- -- -- -- 5\' 7"  (1.702 m) 105.1 kg   Constitutional: Well-developed, well-nourished female in no acute distress.  Cardiovascular: normal rate Respiratory: normal effort GI: Abd soft, non-tender. Pos BS x 4. MS: Extremities nontender, no edema, normal ROM Neurologic: Alert and oriented x 4.  GU: Neg CVAT.  PELVIC EXAM: Cervix pink, visually closed, without lesion, scant white creamy discharge, vaginal walls and external genitalia normal Bimanual exam: Cervix 0/long/high, firm, anterior, neg CMT, uterus nontender, nonenlarged, adnexa without tenderness, enlargement, or mass  LAB RESULTS Results for orders placed or performed during the hospital encounter of 03/05/20 (from the past 24 hour(s))  Urinalysis, Routine w reflex microscopic     Status: Abnormal   Collection Time: 03/05/20 10:11 AM  Result Value Ref Range   Color, Urine YELLOW YELLOW    APPearance HAZY (A) CLEAR   Specific Gravity, Urine 1.018 1.005 - 1.030   pH 5.0 5.0 - 8.0   Glucose, UA NEGATIVE NEGATIVE mg/dL   Hgb urine dipstick NEGATIVE NEGATIVE   Bilirubin Urine NEGATIVE NEGATIVE   Ketones, ur NEGATIVE NEGATIVE mg/dL   Protein, ur NEGATIVE NEGATIVE mg/dL   Nitrite NEGATIVE NEGATIVE   Leukocytes,Ua NEGATIVE NEGATIVE  Pregnancy, urine POC     Status: Abnormal   Collection Time: 03/05/20 10:11 AM  Result Value Ref Range   Preg Test, Ur POSITIVE (A) NEGATIVE  Comprehensive metabolic panel     Status: Abnormal   Collection Time: 03/05/20 10:53 AM  Result Value Ref Range   Sodium 138 135 - 145 mmol/L   Potassium 3.8 3.5 - 5.1 mmol/L   Chloride 104 98 - 111 mmol/L   CO2 24 22 - 32 mmol/L   Glucose, Bld 111 (H) 70 - 99 mg/dL   BUN 10 6 - 20 mg/dL   Creatinine, Ser 1.610.67 0.44 - 1.00 mg/dL   Calcium 8.6 (L) 8.9 - 10.3 mg/dL   Total Protein 6.7 6.5 -  8.1 g/dL   Albumin 3.2 (L) 3.5 - 5.0 g/dL   AST 15 15 - 41 U/L   ALT 15 0 - 44 U/L   Alkaline Phosphatase 48 38 - 126 U/L   Total Bilirubin 0.3 0.3 - 1.2 mg/dL   GFR calc non Af Amer >60 >60 mL/min   GFR calc Af Amer >60 >60 mL/min   Anion gap 10 5 - 15  CBC     Status: Abnormal   Collection Time: 03/05/20 10:53 AM  Result Value Ref Range   WBC 6.2 4.0 - 10.5 K/uL   RBC 4.87 3.87 - 5.11 MIL/uL   Hemoglobin 11.7 (L) 12.0 - 15.0 g/dL   HCT 02.5 36 - 46 %   MCV 76.0 (L) 80.0 - 100.0 fL   MCH 24.0 (L) 26.0 - 34.0 pg   MCHC 31.6 30.0 - 36.0 g/dL   RDW 42.7 (H) 06.2 - 37.6 %   Platelets 256 150 - 400 K/uL   nRBC 0.0 0.0 - 0.2 %  hCG, quantitative, pregnancy     Status: Abnormal   Collection Time: 03/05/20 10:53 AM  Result Value Ref Range   hCG, Beta Chain, Quant, S 368 (H) <5 mIU/mL  Wet prep, genital     Status: Abnormal   Collection Time: 03/05/20 11:08 AM  Result Value Ref Range   Yeast Wet Prep HPF POC NONE SEEN NONE SEEN   Trich, Wet Prep NONE SEEN NONE SEEN   Clue Cells Wet Prep HPF POC PRESENT (A)  NONE SEEN   WBC, Wet Prep HPF POC MANY (A) NONE SEEN   Sperm NONE SEEN        IMAGING US OB LESS THAN 14 WEEKS WITH OB TRANSVAGINAL  Result Date: 03/05/2020 CLINICAL DATA:  Pelvic pain. Quantitative beta HCG pending. Estimated gestational age per LMP 4 weeks 3 days. EXAM: OBSTETRIC <14 WK [place] AND TRANSVAGINAL OB US TECHNIQUE: Both transabdominal and transvaginal ultrasound examinations were performed for complete evaluation of the gestation as well as the maternal uterus, adnexal regions, and pelvic cul-de-sac. Transvaginal technique was performed to assess early pregnancy. COMPARISON:  None. FINDINGS: Intrauterine gestational sac: Not visualized. Yolk sac:  Not visualized. Embryo:  Not visualized. Cardiac Activity: Not visualized. Heart Rate: Not visualized. MSD: Not visualized. CRL: Not visualized. Subchorionic hemorrhage:  None visualized. Maternal uterus/adnexae: Uterus is retroflexed and otherwise normal in size. Fundal endometrium measures 16 mm. Ovaries are normal size, shape and position with normal color Doppler. 3 cm cyst over the left ovary likely corpus luteal cyst. Trace free pelvic fluid. Tiny cyst in the region of the cervix likely small to both in cyst. IMPRESSION: No intrauterine gestational sac, yolk sac, fetal pole, or cardiac activity visualized. Differential considerations include intrauterine gestation too early to be sonographically visualized, spontaneous abortion, or ectopic pregnancy. Recommend correlation with quantitative beta HCG and consider follow-up ultrasound in 14 days and serial quantitative beta HCG follow-up. Electronically Signed   By: Elberta Fortis M.D.   On: 03/05/2020 11:43    MAU Management/MDM: Orders Placed This Encounter  Procedures  . Wet prep, genital  . US OB LESS THAN 14 WEEKS WITH OB TRANSVAGINAL  . Urinalysis, Routine w reflex microscopic Urine, Clean Catch  . Comprehensive metabolic panel  . CBC  . hCG, quantitative, pregnancy  . hCG,  quantitative, pregnancy  . Pregnancy, urine POC  . Discharge patient    Meds ordered this encounter  Medications  . acetaminophen (TYLENOL) tablet 650 mg  . metoCLOPramide (  REGLAN) tablet 10 mg  . metoCLOPramide (REGLAN) 10 MG tablet    Sig: Take 1 tablet (10 mg total) by mouth 3 (three) times daily with meals as needed for nausea or vomiting.    Dispense:  30 tablet    Refill:  0  . Prenatal Vit-Fe Fumarate-FA (PRENATAL VITAMIN) 27-0.8 MG TABS    Sig: Take 1 tablet by mouth daily.    Dispense:  90 tablet    Refill:  3    Treatments in MAU included tylenol and reglan for headache and nausea respectively. Pt discharged with strict bleeding precautions.  ASSESSMENT & PLAN 1. Nausea and vomiting in pregnancy prior to [redacted] weeks gestation: Pt reports several weeks of nausea and intermittent non-blood emesis that has worsened. No medications at home prior to presentation. Reassuringly, normal vital signs, reassuring abdominal exam and tolerating po intake well. -administered Reglan in MAU with complete resolution of symptoms -provided a script for Reglan every 6 hours as needed for nausea -recommended small meals throughout the daily in addition to peppermint and ginger -provided strict return precautions for persistent vomiting, inability to tolerate po intake, or other concerning symptoms  2. Pregnancy of unknown location  Abdominal pain in pregnancy, first trimester: Pt reports constant "period-like" cramping without vaginal bleeding for several days in s/o positive pregnancy test at home last night and again in MAU today. Beta-hcg 368, consistent with early pregnancy. As expected, [place] today unable to identify location of current pregnancy. -counseled pt on results as noted above and provided reassurance -instructed pt to return to MAU in 2 days for repeat beta-hcg -provided strict bleeding precautions  3. Headache in pregnancy, antepartum, first trimester: Pt reports near-daily, frontal  headaches with intermittent photophobia. No reported vision abnormalities. No current medications. No prior BP issues in pregnancy. -administered tylenol in MAU with complete resolution to symptoms -counseled on importance of good hydration and sleep hygiene   Discharge home with strict return precautions as noted above. Plan for repeat beta-hcg in 48hr in MAU as noted above. Provided list of prenatal clinics and encouraged pt to call to schedule initial prenatal appointment.  Allergies as of 03/05/2020   No Known Allergies     Medication List    STOP taking these medications   erythromycin ophthalmic ointment   ketorolac 0.5 % ophthalmic solution Commonly known as: ACULAR     TAKE these medications   albuterol 108 (90 Base) MCG/ACT inhaler Commonly known as: VENTOLIN HFA Inhale 2 puffs into the lungs every 6 (six) hours as needed for wheezing or shortness of breath.   metoCLOPramide 10 MG tablet Commonly known as: REGLAN Take 1 tablet (10 mg total) by mouth 3 (three) times daily with meals as needed for nausea or vomiting.   Prenatal Vitamin 27-0.8 MG Tabs Take 1 tablet by mouth daily.      Lynnda Shields, MD OB Fellow, Faculty [facility] 03/05/2020  1:57 PM

## 2020-03-06 LAB — GC/CHLAMYDIA PROBE AMP (~~LOC~~) NOT AT ARMC
Chlamydia: NEGATIVE
Comment: NEGATIVE
Comment: NORMAL
Neisseria Gonorrhea: NEGATIVE

## 2020-03-14 ENCOUNTER — Ambulatory Visit: Payer: Medicaid Other

## 2020-03-14 ENCOUNTER — Telehealth: Payer: Self-pay

## 2020-03-14 NOTE — Telephone Encounter (Signed)
Called pt to follow up on missed appt for stat beta HCG. VM not set up.

## 2020-03-15 NOTE — Telephone Encounter (Signed)
Called pt a second time; VM not set up. MyChart message sent.

## 2020-03-16 ENCOUNTER — Encounter (HOSPITAL_COMMUNITY): Payer: Self-pay | Admitting: Obstetrics and Gynecology

## 2020-03-16 ENCOUNTER — Inpatient Hospital Stay (HOSPITAL_COMMUNITY): Payer: Medicaid Other

## 2020-03-16 ENCOUNTER — Inpatient Hospital Stay (HOSPITAL_COMMUNITY)
Admission: AD | Admit: 2020-03-16 | Discharge: 2020-03-16 | Disposition: A | Discharge status: Home / Self Care | Payer: Medicaid Other | Attending: Obstetrics and Gynecology | Admitting: Obstetrics and Gynecology

## 2020-03-16 ENCOUNTER — Other Ambulatory Visit: Payer: Self-pay

## 2020-03-16 DIAGNOSIS — Z3A01 Less than 8 weeks gestation of pregnancy: Secondary | ICD-10-CM | POA: Diagnosis not present

## 2020-03-16 DIAGNOSIS — O26891 Other specified pregnancy related conditions, first trimester: Secondary | ICD-10-CM | POA: Insufficient documentation

## 2020-03-16 DIAGNOSIS — O34212 Maternal care for vertical scar from previous cesarean delivery: Secondary | ICD-10-CM | POA: Diagnosis not present

## 2020-03-16 DIAGNOSIS — R102 Pelvic and perineal pain: Secondary | ICD-10-CM | POA: Diagnosis not present

## 2020-03-16 DIAGNOSIS — Z9181 History of falling: Secondary | ICD-10-CM | POA: Insufficient documentation

## 2020-03-16 DIAGNOSIS — M25559 Pain in unspecified hip: Secondary | ICD-10-CM | POA: Diagnosis not present

## 2020-03-16 DIAGNOSIS — Z79899 Other long term (current) drug therapy: Secondary | ICD-10-CM | POA: Diagnosis not present

## 2020-03-16 DIAGNOSIS — Z3490 Encounter for supervision of normal pregnancy, unspecified, unspecified trimester: Secondary | ICD-10-CM

## 2020-03-16 DIAGNOSIS — O3481 Maternal care for other abnormalities of pelvic organs, first trimester: Secondary | ICD-10-CM | POA: Insufficient documentation

## 2020-03-16 DIAGNOSIS — O208 Other hemorrhage in early pregnancy: Secondary | ICD-10-CM | POA: Diagnosis not present

## 2020-03-16 DIAGNOSIS — N83202 Unspecified ovarian cyst, left side: Secondary | ICD-10-CM | POA: Diagnosis not present

## 2020-03-16 LAB — CBC
HCT: 38.1 % (ref 36.0–46.0)
Hemoglobin: 11.8 g/dL — ABNORMAL LOW (ref 12.0–15.0)
MCH: 23.4 pg — ABNORMAL LOW (ref 26.0–34.0)
MCHC: 31 g/dL (ref 30.0–36.0)
MCV: 75.4 fL — ABNORMAL LOW (ref 80.0–100.0)
Platelets: 292 10*3/uL (ref 150–400)
RBC: 5.05 MIL/uL (ref 3.87–5.11)
RDW: 16 % — ABNORMAL HIGH (ref 11.5–15.5)
WBC: 6.5 10*3/uL (ref 4.0–10.5)
nRBC: 0 % (ref 0.0–0.2)

## 2020-03-16 LAB — HCG, QUANTITATIVE, PREGNANCY: hCG, Beta Chain, Quant, S: 17983 m[IU]/mL — ABNORMAL HIGH (ref <5)

## 2020-03-16 NOTE — MAU Note (Signed)
.   Julia Armstrong is a 29 y.o. at [redacted]w[redacted]d here in MAU reporting: that she received a letter to return for lab work to make sure her pregnancy was not in her tube. Pt states she did fall down her steps on Wednesday and fell on her left hip and it hurts to walk on it. Denies any VB LMP: 02/03/20 Onset of complaint: ongoing Pain score: 8 Vitals:   03/16/20 1521 03/16/20 1522  BP: (!) 125/45   Pulse: 75   Resp: 16   Temp: 98.2 F (36.8 C)   SpO2:  100%     FHT: Lab orders placed from triage:

## 2020-03-16 NOTE — MAU Provider Note (Signed)
History     CSN: [id number]  Arrival date and time: 03/16/20 1333   First Provider Initiated Contact with Patient 03/16/20 1550      Chief Complaint  Patient presents with  . Abdominal Pain  . Hip Pain   Julia Armstrong is a 29 y.o. [id number] at [redacted]w[redacted]d who presents today with cramping. She states that this has continued since her visit on 03/05/2020. She did not have follow up blood work done and states that she got a letter saying she needed to be seen right away. She states that she wasn't able to come in because she fell and hit her leg a few weeks ago. She is still having pain in her hip from the fall.   Pelvic Pain The patient's primary symptoms include pelvic pain. The patient's pertinent negatives include no vaginal discharge. This is a new problem. The current episode started 1 to 4 weeks ago. The problem occurs constantly. The problem has been unchanged. The pain is mild. The problem affects both sides. She is pregnant. Pertinent negatives include no chills, dysuria, fever, frequency, nausea or vomiting. The vaginal discharge was normal. There has been no bleeding. Nothing aggravates the symptoms. She has tried nothing for the symptoms. She uses nothing for contraception.    OB History    Gravida  7   Para  4   Term  4   Preterm      AB  2   Living  4     SAB  2   TAB      Ectopic      Multiple  0   Live Births  4           Past Medical History:  Diagnosis Date  . Gallstones   . Wound infection after surgery 2016   c-section incision had to be reopened and wound vac placed    Past Surgical History:  Procedure Laterality Date  . CESAREAN SECTION     C/S x 3  . CESAREAN SECTION N/A 05/10/2018   Procedure: REPEAT CESAREAN SECTION;  Surgeon: Mor[place] Bing, MD;  Location: [facility] BIRTHING SUITES;  Service: Obstetrics;  Laterality: N/A;  . DILATION AND EVACUATION N/A 02/18/2017   Procedure: DILATATION AND EVACUATION;  Surgeon: Tereso Newcomer, MD;   Location: [facility] ORS;  Service: Gynecology;  Laterality: N/A;  . WISDOM TOOTH EXTRACTION      Family History  Problem Relation Age of Onset  . Cancer Other   . Cancer Maternal Grandmother        breast cancer  . Healthy Mother   . Healthy Father     Social History   Tobacco Use  . Smoking status: Never Smoker  . Smokeless tobacco: Never Used  Vaping Use  . Vaping Use: Never used  Substance Use Topics  . Alcohol use: Not Currently  . Drug use: No    Allergies: No Known Allergies  Medications Prior to Admission  Medication Sig Dispense Refill Last Dose  . albuterol (VENTOLIN HFA) 108 (90 Base) MCG/ACT inhaler Inhale 2 puffs into the lungs every 6 (six) hours as needed for wheezing or shortness of breath. 18 g 0   . metoCLOPramide (REGLAN) 10 MG tablet Take 1 tablet (10 mg total) by mouth 3 (three) times daily with meals as needed for nausea or vomiting. 30 tablet 0   . Prenatal Vit-Fe Fumarate-FA (PRENATAL VITAMIN) 27-0.8 MG TABS Take 1 tablet by mouth daily. 90 tablet 3     Review  of Systems  Constitutional: Negative for chills and fever.  Gastrointestinal: Negative for nausea and vomiting.  Genitourinary: Positive for pelvic pain. Negative for dysuria, frequency, vaginal bleeding and vaginal discharge.   Physical Exam   Blood pressure (!) 125/45, pulse 75, temperature 98.2 F (36.8 C), resp. rate 16, last menstrual period 02/03/2020, SpO2 100 %, unknown if currently breastfeeding.  Physical Exam Vitals and nursing note reviewed. Exam conducted with a chaperone present.  Constitutional:      General: She is not in acute distress. HENT:     Head: Normocephalic.  Cardiovascular:     Rate and Rhythm: Normal rate.  Pulmonary:     Effort: Pulmonary effort is normal.  Abdominal:     Palpations: Abdomen is soft.     Tenderness: There is no abdominal tenderness.  Skin:    General: Skin is warm and dry.  Neurological:     Mental Status: She is alert and oriented to  person, place, and time.  Psychiatric:        Mood and Affect: Mood normal.        Behavior: Behavior normal.    Results for orders placed or performed during the hospital encounter of 03/16/20 (from the past 24 hour(s))  CBC     Status: Abnormal   Collection Time: 03/16/20  1:50 PM  Result Value Ref Range   WBC 6.5 4.0 - 10.5 K/uL   RBC 5.05 3.87 - 5.11 MIL/uL   Hemoglobin 11.8 (L) 12.0 - 15.0 g/dL   HCT 29.5 36 - 46 %   MCV 75.4 (L) 80.0 - 100.0 fL   MCH 23.4 (L) 26.0 - 34.0 pg   MCHC 31.0 30.0 - 36.0 g/dL   RDW 28.4 (H) 13.2 - 44.0 %   Platelets 292 150 - 400 K/uL   nRBC 0.0 0.0 - 0.2 %  hCG, quantitative, pregnancy     Status: Abnormal   Collection Time: 03/16/20  1:50 PM  Result Value Ref Range   hCG, Beta Chain, Quant, S 17,983 (H) <5 mIU/mL   US OB Transvaginal  Result Date: 03/16/2020 CLINICAL DATA:  29 year old pregnant female with pelvic pain. LMP: 02/03/2020 corresponding to an estimated gestational age of [redacted] weeks, 0 days. EXAM: TRANSVAGINAL OB ULTRASOUND TECHNIQUE: Transvaginal ultrasound was performed for complete evaluation of the gestation as well as the maternal uterus, adnexal regions, and pelvic cul-de-sac. COMPARISON:  Ultrasound dated 03/05/2020. FINDINGS: Intrauterine gestational sac: Single intrauterine gestational sac. Yolk sac:  Seen Embryo:  Not present. Cardiac Activity: N/A MSD: 12 mm   5 w   6 d Subchorionic hemorrhage:  Possible trace perigestational hemorrhage. Maternal uterus/adnexae: The uterus is retroverted. The ovaries are unremarkable. There is a 3 cm corpus luteum cyst in the left ovary. Small free fluid within the pelvis. IMPRESSION: Single intrauterine gestational sac with an estimated gestational age of [redacted] weeks, 6 days based on ultrasound concordant with age based on LMP. No fetal pole identified at this time. Follow-up with ultrasound in 7-11 days, or earlier if clinically indicated, recommended. Electronically Signed   By: Elgie Collard M.D.    On: 03/16/2020 16:30    MAU Course  Procedures  MDM   Assessment and Plan   1. Intrauterine pregnancy   2. [redacted] weeks gestation of pregnancy    DC home Comfort measures reviewed  1st Trimester precautions  Bleeding precautions RX: none  Return to MAU as needed Plan for [place] for viability in 2 weeks    Follow-up Information  [facility] Follow up.   Specialty: Obstetrics and Gynecology Why: They will call you to schedule your ultrasound appointment  Contact information: [address] [place] [address] [phone]               Thressa Sheller DNP, CNM  03/16/20  3:57 PM

## 2020-03-16 NOTE — Discharge Instructions (Signed)
First Trimester of Pregnancy The first trimester of pregnancy is from week 1 until the end of week 13 (months 1 through 3). A week after a sperm fertilizes an egg, the egg will implant on the wall of the uterus. This embryo will begin to develop into a baby. Genes from you and your partner will form the baby. The female genes will determine whether the baby will be a boy or a girl. At 6-8 weeks, the eyes and face will be formed, and the heartbeat can be seen on ultrasound. At the end of 12 weeks, all the baby's organs will be formed. Now that you are pregnant, you will want to do everything you can to have a healthy baby. Two of the most important things are to get good prenatal care and to follow your health care provider's instructions. Prenatal care is all the medical care you receive before the baby's birth. This care will help prevent, find, and treat any problems during the pregnancy and childbirth. Body changes during your first trimester Your body goes through many changes during pregnancy. The changes vary from woman to woman.  You may gain or lose a couple of pounds at first.  You may feel sick to your stomach (nauseous) and you may throw up (vomit). If the vomiting is uncontrollable, call your health care provider.  You may tire easily.  You may develop headaches that can be relieved by medicines. All medicines should be approved by your health care provider.  You may urinate more often. Painful urination may mean you have a bladder infection.  You may develop heartburn as a result of your pregnancy.  You may develop constipation because certain hormones are causing the muscles that push stool through your intestines to slow down.  You may develop hemorrhoids or swollen veins (varicose veins).  Your breasts may begin to grow larger and become tender. Your nipples may stick out more, and the tissue that surrounds them (areola) may become darker.  Your gums may bleed and may be  sensitive to brushing and flossing.  Dark spots or blotches (chloasma, mask of pregnancy) may develop on your face. This will likely fade after the baby is born.  Your menstrual periods will stop.  You may have a loss of appetite.  You may develop cravings for certain kinds of food.  You may have changes in your emotions from day to day, such as being excited to be pregnant or being concerned that something may go wrong with the pregnancy and baby.  You may have more vivid and strange dreams.  You may have changes in your hair. These can include thickening of your hair, rapid growth, and changes in texture. Some women also have hair loss during or after pregnancy, or hair that feels dry or thin. Your hair will most likely return to normal after your baby is born. What to expect at prenatal visits During a routine prenatal visit:  You will be weighed to make sure you and the baby are growing normally.  Your blood pressure will be taken.  Your abdomen will be measured to track your baby's growth.  The fetal heartbeat will be listened to between weeks 10 and 14 of your pregnancy.  Test results from any previous visits will be discussed. Your health care provider may ask you:  How you are feeling.  If you are feeling the baby move.  If you have had any abnormal symptoms, such as leaking fluid, bleeding, severe headaches, or abdominal   cramping.  If you are using any tobacco products, including cigarettes, chewing tobacco, and electronic cigarettes.  If you have any questions. Other tests that may be performed during your first trimester include:  Blood tests to find your blood type and to check for the presence of any previous infections. The tests will also be used to check for low iron levels (anemia) and protein on red blood cells (Rh antibodies). Depending on your risk factors, or if you previously had diabetes during pregnancy, you may have tests to check for high blood sugar  that affects pregnant women (gestational diabetes).  Urine tests to check for infections, diabetes, or protein in the urine.  An ultrasound to confirm the proper growth and development of the baby.  Fetal screens for spinal cord problems (spina bifida) and Down syndrome.  HIV (human immunodeficiency virus) testing. Routine prenatal testing includes screening for HIV, unless you choose not to have this test.  You may need other tests to make sure you and the baby are doing well. Follow these instructions at home: Medicines  Follow your health care provider's instructions regarding medicine use. Specific medicines may be either safe or unsafe to take during pregnancy.  Take a prenatal vitamin that contains at least 600 micrograms (mcg) of folic acid.  If you develop constipation, try taking a stool softener if your health care provider approves. Eating and drinking   Eat a balanced diet that includes fresh fruits and vegetables, whole grains, good sources of protein such as meat, eggs, or tofu, and low-fat dairy. Your health care provider will help you determine the amount of weight gain that is right for you.  Avoid raw meat and uncooked cheese. These carry germs that can cause birth defects in the baby.  Eating four or five small meals rather than three large meals a day may help relieve nausea and vomiting. If you start to feel nauseous, eating a few soda crackers can be helpful. Drinking liquids between meals, instead of during meals, also seems to help ease nausea and vomiting.  Limit foods that are high in fat and processed sugars, such as fried and sweet foods.  To prevent constipation: ? Eat foods that are high in fiber, such as fresh fruits and vegetables, whole grains, and beans. ? Drink enough fluid to keep your urine clear or pale yellow. Activity  Exercise only as directed by your health care provider. Most women can continue their usual exercise routine during  pregnancy. Try to exercise for 30 minutes at least 5 days a week. Exercising will help you: ? Control your weight. ? Stay in shape. ? Be prepared for labor and delivery.  Experiencing pain or cramping in the lower abdomen or lower back is a good sign that you should stop exercising. Check with your health care provider before continuing with normal exercises.  Try to avoid standing for long periods of time. Move your legs often if you must stand in one place for a long time.  Avoid heavy lifting.  Wear low-heeled shoes and practice good posture.  You may continue to have sex unless your health care provider tells you not to. Relieving pain and discomfort  Wear a good support bra to relieve breast tenderness.  Take warm sitz baths to soothe any pain or discomfort caused by hemorrhoids. Use hemorrhoid cream if your health care provider approves.  Rest with your legs elevated if you have leg cramps or low back pain.  If you develop varicose veins in   your legs, wear support hose. Elevate your feet for 15 minutes, 3-4 times a day. Limit salt in your diet. Prenatal care  Schedule your prenatal visits by the twelfth week of pregnancy. They are usually scheduled monthly at first, then more often in the last 2 months before delivery.  Write down your questions. Take them to your prenatal visits.  Keep all your prenatal visits as told by your health care provider. This is important. Safety  Wear your seat belt at all times when driving.  Make a list of emergency phone numbers, including numbers for family, friends, the hospital, and police and fire departments. General instructions  Ask your health care provider for a referral to a local prenatal education class. Begin classes no later than the beginning of month 6 of your pregnancy.  Ask for help if you have counseling or nutritional needs during pregnancy. Your health care provider can offer advice or refer you to specialists for help  with various needs.  Do not use hot tubs, steam rooms, or saunas.  Do not douche or use tampons or scented sanitary pads.  Do not cross your legs for long periods of time.  Avoid cat litter boxes and soil used by cats. These carry germs that can cause birth defects in the baby and possibly loss of the fetus by miscarriage or stillbirth.  Avoid all smoking, herbs, alcohol, and medicines not prescribed by your health care provider. Chemicals in these products affect the formation and growth of the baby.  Do not use any products that contain nicotine or tobacco, such as cigarettes and e-cigarettes. If you need help quitting, ask your health care provider. You may receive counseling support and other resources to help you quit.  Schedule a dentist appointment. At home, brush your teeth with a soft toothbrush and be gentle when you floss. Contact a health care provider if:  You have dizziness.  You have mild pelvic cramps, pelvic pressure, or nagging pain in the abdominal area.  You have persistent nausea, vomiting, or diarrhea.  You have a bad smelling vaginal discharge.  You have pain when you urinate.  You notice increased swelling in your face, hands, legs, or ankles.  You are exposed to fifth disease or chickenpox.  You are exposed to German measles (rubella) and have never had it. Get help right away if:  You have a fever.  You are leaking fluid from your vagina.  You have spotting or bleeding from your vagina.  You have severe abdominal cramping or pain.  You have rapid weight gain or loss.  You vomit blood or material that looks like coffee grounds.  You develop a severe headache.  You have shortness of breath.  You have any kind of trauma, such as from a fall or a car accident. Summary  The first trimester of pregnancy is from week 1 until the end of week 13 (months 1 through 3).  Your body goes through many changes during pregnancy. The changes vary from  woman to woman.  You will have routine prenatal visits. During those visits, your health care provider will examine you, discuss any test results you may have, and talk with you about how you are feeling. This information is not intended to replace advice given to you by your health care provider. Make sure you discuss any questions you have with your health care provider. Document Revised: 06/26/2017 Document Reviewed: 06/25/2016 Elsevier Patient Education  2020 [employer]  

## 2020-04-04 ENCOUNTER — Encounter (HOSPITAL_COMMUNITY): Payer: Self-pay | Admitting: Obstetrics and Gynecology

## 2020-04-04 ENCOUNTER — Inpatient Hospital Stay (HOSPITAL_COMMUNITY)
Admission: AD | Admit: 2020-04-04 | Discharge: 2020-04-04 | Disposition: A | Discharge status: Home / Self Care | Payer: Medicaid Other | Attending: Obstetrics and Gynecology | Admitting: Obstetrics and Gynecology

## 2020-04-04 DIAGNOSIS — O219 Vomiting of pregnancy, unspecified: Secondary | ICD-10-CM | POA: Diagnosis present

## 2020-04-04 DIAGNOSIS — Z3A08 8 weeks gestation of pregnancy: Secondary | ICD-10-CM | POA: Insufficient documentation

## 2020-04-04 DIAGNOSIS — O34212 Maternal care for vertical scar from previous cesarean delivery: Secondary | ICD-10-CM | POA: Diagnosis not present

## 2020-04-04 DIAGNOSIS — O26851 Spotting complicating pregnancy, first trimester: Secondary | ICD-10-CM

## 2020-04-04 DIAGNOSIS — O209 Hemorrhage in early pregnancy, unspecified: Secondary | ICD-10-CM

## 2020-04-04 DIAGNOSIS — R112 Nausea with vomiting, unspecified: Secondary | ICD-10-CM

## 2020-04-04 LAB — URINALYSIS, ROUTINE W REFLEX MICROSCOPIC
Bilirubin Urine: NEGATIVE
Glucose, UA: NEGATIVE mg/dL
Hgb urine dipstick: NEGATIVE
Ketones, ur: NEGATIVE mg/dL
Leukocytes,Ua: NEGATIVE
Nitrite: NEGATIVE
Protein, ur: NEGATIVE mg/dL
Specific Gravity, Urine: 1.02 (ref 1.005–1.030)
pH: 6 (ref 5.0–8.0)

## 2020-04-04 MED ORDER — ONDANSETRON HCL 4 MG PO TABS
8.0000 mg | ORAL_TABLET | Freq: Once | ORAL | Status: AC
  Administered 2020-04-04: 8 mg via ORAL
  Filled 2020-04-04: qty 2

## 2020-04-04 MED ORDER — PANTOPRAZOLE SODIUM 20 MG PO TBEC
20.0000 mg | DELAYED_RELEASE_TABLET | Freq: Every day | ORAL | 0 refills | Status: DC
Start: 2020-04-04 — End: 2020-05-04

## 2020-04-04 MED ORDER — PANTOPRAZOLE SODIUM 20 MG PO TBEC
20.0000 mg | DELAYED_RELEASE_TABLET | Freq: Once | ORAL | Status: AC
  Administered 2020-04-04: 20 mg via ORAL
  Filled 2020-04-04: qty 1

## 2020-04-04 MED ORDER — PROMETHAZINE HCL 25 MG PO TABS
25.0000 mg | ORAL_TABLET | Freq: Four times a day (QID) | ORAL | 2 refills | Status: DC | PRN
Start: 2020-04-04 — End: 2020-05-04

## 2020-04-04 NOTE — Discharge Instructions (Signed)

## 2020-04-04 NOTE — MAU Provider Note (Signed)
Chief Complaint: Nausea and Emesis   First Provider Initiated Contact with Patient 04/04/20 0056        SUBJECTIVE HPI: Julia Armstrong is a 29 y.o. O2V0350 at [redacted]w[redacted]d by LMP who presents to maternity admissions reporting vomiting since Sunday.  Was seen on 03/05/20 for vomiting and given Reglan as that was what worked In [facility]U.  States it does not work now  Has not tried anything today.  Has had only salad today. . She denies vaginal bleeding, vaginal itching/burning, urinary symptoms, h/a, dizziness, or fever/chills.    Emesis  This is a recurrent problem. The current episode started today. The problem occurs intermittently. The problem has been unchanged. There has been no fever. Pertinent negatives include no abdominal pain, chest pain, chills, diarrhea, dizziness, fever or headaches. She has tried nothing for the symptoms.   RN Note: PT HAS ARRIVED VIA EMS - FOR VOMITING .  SAYS STARTED VOMITING ON Sunday.  WAS HERE 1 WEEK AGO-  WE GAVE MEDS  FOR VOMITING - TOOK AT 930PM-  .   ATE SALAD FOR BKFT.  NOTHING TO DRINK   Past Medical History:  Diagnosis Date   Gallstones    Wound infection after surgery 2016   c-section incision had to be reopened and wound vac placed   Past Surgical History:  Procedure Laterality Date   CESAREAN SECTION     C/S x 3   CESAREAN SECTION N/A 05/10/2018   Procedure: REPEAT CESAREAN SECTION;  Surgeon: Milo Bing, MD;  Location: [facility];  Service: Obstetrics;  Laterality: N/A;   DILATION AND EVACUATION N/A 02/18/2017   Procedure: DILATATION AND EVACUATION;  Surgeon: Tereso Newcomer, MD;  Location: [facility] ORS;  Service: Gynecology;  Laterality: N/A;   WISDOM TOOTH EXTRACTION     Social History   Socioeconomic History   Marital status: Significant Other    Spouse name: Not on file   Number of children: Not on file   Years of education: Not on file   Highest education level: Not on file  Occupational History   Not on file  Tobacco  Use   Smoking status: Never Smoker   Smokeless tobacco: Never Used  Vaping Use   Vaping Use: Never used  Substance and Sexual Activity   Alcohol use: Not Currently   Drug use: No   Sexual activity: Yes    Birth control/protection: None  Other Topics Concern   Not on file  Social History Narrative   Not on file   Social Determinants of Health   Financial Resource Strain:    Difficulty of Paying Living Expenses: Not on file  Food Insecurity:    Worried About Running Out of Food in the Last Year: Not on file   Ran Out of Food in the Last Year: Not on file  Transportation Needs:    Lack of Transportation (Medical): Not on file   Lack of Transportation (Non-Medical): Not on file  Physical Activity:    Days of Exercise per Week: Not on file   Minutes of Exercise per Session: Not on file  Stress:    Feeling of Stress : Not on file  Social Connections:    Frequency of Communication with Friends and Family: Not on file   Frequency of Social Gatherings with Friends and Family: Not on file   Attends Religious Services: Not on file   Active Member of Clubs or Organizations: Not on file   Attends Banker Meetings: Not on  file   Marital Status: Not on file  Intimate Partner Violence:    Fear of Current or Ex-Partner: Not on file   Emotionally Abused: Not on file   Physically Abused: Not on file   Sexually Abused: Not on file   No current facility-administered medications on file prior to encounter.   Current Outpatient Medications on File Prior to Encounter  Medication Sig Dispense Refill   albuterol (VENTOLIN HFA) 108 (90 Base) MCG/ACT inhaler Inhale 2 puffs into the lungs every 6 (six) hours as needed for wheezing or shortness of breath. 18 g 0   No Known Allergies  I have reviewed patient's Past Medical Hx, Surgical Hx, Family Hx, Social Hx, medications and allergies.   ROS:  Review of Systems  Constitutional: Negative for chills and  fever.  Cardiovascular: Negative for chest pain.  Gastrointestinal: Positive for vomiting. Negative for abdominal pain and diarrhea.  Neurological: Negative for dizziness and headaches.   Review of Systems  Other systems negative   Physical Exam  Physical Exam Patient Vitals for the past 24 hrs:  BP Temp Temp src Pulse Resp Height Weight  04/04/20 0034 122/64 98.1 F (36.7 C) Oral 68 20 5\' 7"  (1.702 m) 99.8 kg   Constitutional: Well-developed, well-nourished female in no acute distress.  Cardiovascular: normal rate Respiratory: normal effort GI: Abd soft, non-tender. Pos BS x 4 MS: Extremities nontender, no edema, normal ROM Neurologic: Alert and oriented x 4.  GU: Neg CVAT.  PELVIC EXAM: deferred  LAB RESULTS Results for orders placed or performed during the hospital encounter of 04/04/20 (from the past 24 hour(s))  Urinalysis, Routine w reflex microscopic     Status: None   Collection Time: 04/04/20 12:12 AM  Result Value Ref Range   Color, Urine YELLOW YELLOW   APPearance CLEAR CLEAR   Specific Gravity, Urine 1.020 1.005 - 1.030   pH 6.0 5.0 - 8.0   Glucose, UA NEGATIVE NEGATIVE mg/dL   Hgb urine dipstick NEGATIVE NEGATIVE   Bilirubin Urine NEGATIVE NEGATIVE   Ketones, ur NEGATIVE NEGATIVE mg/dL   Protein, ur NEGATIVE NEGATIVE mg/dL   Nitrite NEGATIVE NEGATIVE   Leukocytes,Ua NEGATIVE NEGATIVE     IMAGING   MAU Management/MDM: Ordered Zofran and Protonix.  Did not want dissolvable zofran so we gave tablet Had no more vomiting while here Tried PO intake and tolerated it well Discussed slow and steady intake at home  ASSESSMENT Pregnancy at [redacted]w[redacted]d Intermittent vomiting Intermittent spotting  PLAN Discharge home Rx Zofran and Protonix for vomiting Never went for her new OB.  Encouraged her to reschedule, pt states trying to go to [facility] Ordered outpatient [redacted]w[redacted]d to verify viability Pt stable at time of discharge. Encouraged to return here or to other Urgent  Care/ED if she develops worsening of symptoms, increase in pain, fever, or other concerning symptoms.    [place] CNM, MSN Certified Nurse-Midwife 04/04/2020  1:07 AM

## 2020-04-04 NOTE — MAU Note (Signed)
PT HAS ARRIVED VIA EMS - FOR VOMITING .  SAYS STARTED VOMITING ON Sunday.  WAS HERE 1 WEEK AGO-  WE GAVE MEDS  FOR VOMITING - TOOK AT 930PM-  .   ATE SALAD FOR BKFT.  NOTHING TO DRINK .

## 2020-04-06 ENCOUNTER — Other Ambulatory Visit: Payer: Self-pay

## 2020-04-06 ENCOUNTER — Inpatient Hospital Stay (HOSPITAL_COMMUNITY)
Admission: AD | Admit: 2020-04-06 | Discharge: 2020-04-06 | Disposition: A | Discharge status: Home / Self Care | Payer: Medicaid Other | Attending: Obstetrics & Gynecology | Admitting: Obstetrics & Gynecology

## 2020-04-06 ENCOUNTER — Inpatient Hospital Stay (HOSPITAL_COMMUNITY): Payer: Medicaid Other

## 2020-04-06 ENCOUNTER — Encounter (HOSPITAL_COMMUNITY): Payer: Self-pay | Admitting: Obstetrics & Gynecology

## 2020-04-06 DIAGNOSIS — O039 Complete or unspecified spontaneous abortion without complication: Secondary | ICD-10-CM | POA: Insufficient documentation

## 2020-04-06 DIAGNOSIS — Z3A09 9 weeks gestation of pregnancy: Secondary | ICD-10-CM

## 2020-04-06 DIAGNOSIS — O34212 Maternal care for vertical scar from previous cesarean delivery: Secondary | ICD-10-CM | POA: Insufficient documentation

## 2020-04-06 DIAGNOSIS — O4690 Antepartum hemorrhage, unspecified, unspecified trimester: Secondary | ICD-10-CM

## 2020-04-06 DIAGNOSIS — Z79899 Other long term (current) drug therapy: Secondary | ICD-10-CM | POA: Diagnosis not present

## 2020-04-06 DIAGNOSIS — R109 Unspecified abdominal pain: Secondary | ICD-10-CM | POA: Diagnosis not present

## 2020-04-06 DIAGNOSIS — O26891 Other specified pregnancy related conditions, first trimester: Secondary | ICD-10-CM | POA: Diagnosis not present

## 2020-04-06 LAB — CBC WITH DIFFERENTIAL/PLATELET
Abs Immature Granulocytes: 0.02 10*3/uL (ref 0.00–0.07)
Basophils Absolute: 0 10*3/uL (ref 0.0–0.1)
Basophils Relative: 0 %
Eosinophils Absolute: 0 10*3/uL (ref 0.0–0.5)
Eosinophils Relative: 0 %
HCT: 39.3 % (ref 36.0–46.0)
Hemoglobin: 12.5 g/dL (ref 12.0–15.0)
Immature Granulocytes: 0 %
Lymphocytes Relative: 22 %
Lymphs Abs: 1.5 10*3/uL (ref 0.7–4.0)
MCH: 23.9 pg — ABNORMAL LOW (ref 26.0–34.0)
MCHC: 31.8 g/dL (ref 30.0–36.0)
MCV: 75 fL — ABNORMAL LOW (ref 80.0–100.0)
Monocytes Absolute: 0.4 10*3/uL (ref 0.1–1.0)
Monocytes Relative: 6 %
Neutro Abs: 4.9 10*3/uL (ref 1.7–7.7)
Neutrophils Relative %: 72 %
Platelets: 318 10*3/uL (ref 150–400)
RBC: 5.24 MIL/uL — ABNORMAL HIGH (ref 3.87–5.11)
RDW: 15.5 % (ref 11.5–15.5)
WBC: 6.8 10*3/uL (ref 4.0–10.5)
nRBC: 0 % (ref 0.0–0.2)

## 2020-04-06 LAB — URINALYSIS, MICROSCOPIC (REFLEX): RBC / HPF: >50 RBC/hpf (ref 0–5)

## 2020-04-06 LAB — URINALYSIS, ROUTINE W REFLEX MICROSCOPIC

## 2020-04-06 MED ORDER — IBUPROFEN 800 MG PO TABS: 800.0000 mg | ORAL_TABLET | Freq: Three times a day (TID) | ORAL | 0 refills | Status: DC

## 2020-04-06 MED ORDER — OXYCODONE-ACETAMINOPHEN 5-325 MG PO TABS
2.0000 | ORAL_TABLET | Freq: Once | ORAL | Status: AC
  Administered 2020-04-06: 2 via ORAL
  Filled 2020-04-06: qty 2

## 2020-04-06 MED ORDER — KETOROLAC TROMETHAMINE 60 MG/2ML IM SOLN
60.0000 mg | Freq: Once | INTRAMUSCULAR | Status: AC
  Administered 2020-04-06: 60 mg via INTRAMUSCULAR
  Filled 2020-04-06: qty 2

## 2020-04-06 MED ORDER — OXYCODONE-ACETAMINOPHEN 5-325 MG PO TABS: 2.0000 | ORAL_TABLET | ORAL | 0 refills | Status: DC | PRN

## 2020-04-06 NOTE — MAU Provider Note (Signed)
History     CSN: [id number]  Arrival date and time: 04/06/20 1419  Chief Complaint  Patient presents with  . Vaginal Bleeding   HPI Julia Armstrong is a 29 y.o. [id number] at [redacted]w[redacted]d who presents with vaginal bleeding and abdominal pain. She states it started at 0830 this morning. She reports soaking pads and clots. She also reports abdominal pain that comes and goes. She rates the pain a 10/10. She had an ultrasound on 8/20 that showed an IUP with yolk sac.   OB History    Gravida  7   Para  4   Term  4   Preterm      AB  2   Living  4     SAB  2   TAB      Ectopic      Multiple  0   Live Births  4           Past Medical History:  Diagnosis Date  . Gallstones   . Wound infection after surgery 2016   c-section incision had to be reopened and wound vac placed    Past Surgical History:  Procedure Laterality Date  . CESAREAN SECTION     C/S x 3  . CESAREAN SECTION N/A 05/10/2018   Procedure: REPEAT CESAREAN SECTION;  Surgeon: Louise Bing, MD;  Location: [facility]TH[facility];  Service: Obstetrics;  Laterality: N/A;  . DILATION AND EVACUATION N/A 02/18/2017   Procedure: DILATATION AND EVACUATION;  Surgeon: Tereso Newcomer, MD;  Location: [facility];  Service: Gynecology;  Laterality: N/A;  . WISDOM TOOTH EXTRACTION      Family History  Problem Relation Age of Onset  . Cancer Other   . Cancer Maternal Grandmother        breast cancer  . Healthy Mother   . Healthy Father     Social History   Tobacco Use  . Smoking status: Never Smoker  . Smokeless tobacco: Never Used  Vaping Use  . Vaping Use: Never used  Substance Use Topics  . Alcohol use: Not Currently  . Drug use: No    Allergies: No Known Allergies  Medications Prior to Admission  Medication Sig Dispense Refill Last Dose  . acetaminophen (TYLENOL) 500 MG tablet Take 1,000 mg by mouth every 6 (six) hours as needed.     [name] albuterol (VENTOLIN HFA) 108 (90 Base) MCG/ACT inhaler Inhale 2  puffs into the lungs every 6 (six) hours as needed for wheezing or shortness of breath. 18 g 0 Past Week at Unknown time  . pantoprazole (PROTONIX) 20 MG tablet Take 1 tablet (20 mg total) by mouth daily. 30 tablet 0 04/06/2020 at Unknown time  . promethazine (PHENERGAN) 25 MG tablet Take 1 tablet (25 mg total) by mouth every 6 (six) hours as needed for nausea or vomiting. 30 tablet 2 04/06/2020 at Unknown time    Review of Systems  Constitutional: Negative.  Negative for fatigue and fever.  HENT: Negative.   Respiratory: Negative.  Negative for shortness of breath.   Cardiovascular: Negative.  Negative for chest pain.  Gastrointestinal: Positive for abdominal pain. Negative for constipation, diarrhea, nausea and vomiting.  Genitourinary: Positive for vaginal bleeding. Negative for dysuria.  Musculoskeletal: Positive for back pain.  Neurological: Negative.  Negative for dizziness and headaches.   Physical Exam   Blood pressure 112/62, pulse 76, temperature 98.8 F (37.1 C), temperature source Oral, resp. rate 20, height 5\' 7"  (1.702 m), weight 100.6 kg, last  menstrual period 02/03/2020, SpO2 100 %, unknown if currently breastfeeding.  Physical Exam Vitals and nursing note reviewed.  Constitutional:      General: She is not in acute distress.    Appearance: She is well-developed.  HENT:     Head: Normocephalic.  Eyes:     Pupils: Pupils are equal, round, and reactive to light.  Cardiovascular:     Rate and Rhythm: Normal rate and regular rhythm.     Heart sounds: Normal heart sounds.  Pulmonary:     Effort: Pulmonary effort is normal. No respiratory distress.     Breath sounds: Normal breath sounds.  Abdominal:     General: Bowel sounds are normal. There is no distension.     Palpations: Abdomen is soft.     Tenderness: There is no abdominal tenderness.  Genitourinary:    Comments: Moderate amount of bright red bleeding with clots Skin:    General: Skin is warm and dry.   Neurological:     Mental Status: She is alert and oriented to person, place, and time.  Psychiatric:        Behavior: Behavior normal.        Thought Content: Thought content normal.        Judgment: Judgment normal.     MAU Course  Procedures Results for orders placed or performed during the hospital encounter of 04/06/20 (from the past 24 hour(s))  Urinalysis, Routine w reflex microscopic Urine, Clean Catch     Status: Abnormal   Collection Time: 04/06/20  3:22 PM  Result Value Ref Range   Color, Urine RED (A) YELLOW   APPearance TURBID (A) CLEAR   Specific Gravity, Urine  1.005 - 1.030    TEST NOT REPORTED DUE TO COLOR INTERFERENCE OF URINE PIGMENT   pH  5.0 - 8.0    TEST NOT REPORTED DUE TO COLOR INTERFERENCE OF URINE PIGMENT   Glucose, UA (A) NEGATIVE mg/dL    TEST NOT REPORTED DUE TO COLOR INTERFERENCE OF URINE PIGMENT   Hgb urine dipstick (A) NEGATIVE    TEST NOT REPORTED DUE TO COLOR INTERFERENCE OF URINE PIGMENT   Bilirubin Urine (A) NEGATIVE    TEST NOT REPORTED DUE TO COLOR INTERFERENCE OF URINE PIGMENT   Ketones, ur (A) NEGATIVE mg/dL    TEST NOT REPORTED DUE TO COLOR INTERFERENCE OF URINE PIGMENT   Protein, ur (A) NEGATIVE mg/dL    TEST NOT REPORTED DUE TO COLOR INTERFERENCE OF URINE PIGMENT   Nitrite (A) NEGATIVE    TEST NOT REPORTED DUE TO COLOR INTERFERENCE OF URINE PIGMENT   Leukocytes,Ua (A) NEGATIVE    TEST NOT REPORTED DUE TO COLOR INTERFERENCE OF URINE PIGMENT  Urinalysis, Microscopic (reflex)     Status: Abnormal   Collection Time: 04/06/20  3:22 PM  Result Value Ref Range   RBC / HPF >50 0 - 5 RBC/hpf   WBC, UA 6-10 0 - 5 WBC/hpf   Bacteria, UA FEW (A) NONE SEEN   Squamous Epithelial / LPF 0-5 0 - 5   Mucus PRESENT    Urine-Other MICROSCOPIC EXAM PERFORMED ON UNCONCENTRATED URINE   CBC with Differential/Platelet     Status: Abnormal   Collection Time: 04/06/20  3:43 PM  Result Value Ref Range   WBC 6.8 4.0 - 10.5 K/uL   RBC 5.24 (H) 3.87 - 5.11  MIL/uL   Hemoglobin 12.5 12.0 - 15.0 g/dL   HCT 08.6 36 - 46 %   MCV 75.0 (L) 80.0 -  100.0 fL   MCH 23.9 (L) 26.0 - 34.0 pg   MCHC 31.8 30.0 - 36.0 g/dL   RDW 35.5 73.2 - 20.2 %   Platelets 318 150 - 400 K/uL   nRBC 0.0 0.0 - 0.2 %   Neutrophils Relative % 72 %   Neutro Abs 4.9 1.7 - 7.7 K/uL   Lymphocytes Relative 22 %   Lymphs Abs 1.5 0.7 - 4.0 K/uL   Monocytes Relative 6 %   Monocytes Absolute 0.4 0 - 1 K/uL   Eosinophils Relative 0 %   Eosinophils Absolute 0.0 0 - 0 K/uL   Basophils Relative 0 %   Basophils Absolute 0.0 0 - 0 K/uL   Immature Granulocytes 0 %   Abs Immature Granulocytes 0.02 0.00 - 0.07 K/uL   US OB LESS THAN 14 WEEKS WITH OB TRANSVAGINAL  Result Date: 04/06/2020 CLINICAL DATA:  Vaginal bleeding, beta HCG 17,983 on 03/16/2020 EXAM: OBSTETRIC <14 WK [place] AND TRANSVAGINAL OB US TECHNIQUE: Both transabdominal and transvaginal ultrasound examinations were performed for complete evaluation of the gestation as well as the maternal uterus, adnexal regions, and pelvic cul-de-sac. Transvaginal technique was performed to assess early pregnancy. COMPARISON:  03/16/2020 FINDINGS: Intrauterine gestational sac: None Yolk sac:  Not Visualized. Embryo:  Not Visualized. Maternal uterus/adnexae: Uterus is retroverted. Endometrium is thickened measuring 19 mm. No evidence of retained products of conception. Right ovary measures 2.5 x 1.9 x 1.2 cm and the left ovary measures 4.2 x 3.2 x 3.4 cm. Dominant follicle versus residual of corpus luteum cyst left ovary measuring 2.5 cm. Small amount of free fluid within the pelvis. IMPRESSION: 1. No evidence of intrauterine pregnancy. The previously identified intrauterine gestational sac and yolk sac are no longer seen. Findings are compatible with interval spontaneous abortion. 2. Small amount of free fluid within the pelvis. 3. Residual corpus luteum cyst or dominant follicle left ovary. Otherwise unremarkable adnexal structures. Electronically  Signed   By: Sharlet Salina M.D.   On: 04/06/2020 17:02   MDM UA CBC with Diff US OB transvaginal Toradol IM  Patient reports still having cramping. Will give PO percocet  Reviewed results with patient at length. Expectations for pain and bleeding reviewed at length as well as follow up. Patient verbalized understanding.   Assessment and Plan   1. SAB (spontaneous abortion)   2. Vaginal bleeding during pregnancy   3. [redacted] weeks gestation of pregnancy    -Discharge home in stable condition -Rx for percocet and ibuprofen sent to patient's pharmacy -Vaginal bleeding and pain precautions discussed -Patient advised to follow-up with [facility] in 1 week for blood work and 2 weeks with a provider, message sent -Patient may return to MAU as needed or if her condition were to change or worsen   Rolm Bookbinder CNM 04/06/2020, 5:18 PM

## 2020-04-06 NOTE — Discharge Instructions (Signed)

## 2020-04-06 NOTE — MAU Note (Signed)
Vaginal bleeding started this morning around 0830.  Passing clots and soaked through 3 pads in 1 hour.  Abdominal pain 10/10 that comes and goes.

## 2020-04-13 ENCOUNTER — Other Ambulatory Visit: Payer: Self-pay

## 2020-04-13 ENCOUNTER — Other Ambulatory Visit: Payer: Medicaid Other

## 2020-04-13 DIAGNOSIS — O039 Complete or unspecified spontaneous abortion without complication: Secondary | ICD-10-CM

## 2020-04-23 ENCOUNTER — Other Ambulatory Visit (INDEPENDENT_AMBULATORY_CARE_PROVIDER_SITE_OTHER): Payer: Self-pay

## 2020-04-24 ENCOUNTER — Ambulatory Visit (INDEPENDENT_AMBULATORY_CARE_PROVIDER_SITE_OTHER): Payer: Medicaid Other | Admitting: Certified Nurse Midwife

## 2020-04-24 ENCOUNTER — Encounter: Payer: Self-pay | Admitting: Certified Nurse Midwife

## 2020-04-24 ENCOUNTER — Other Ambulatory Visit: Payer: Self-pay

## 2020-04-24 VITALS — BP 118/71 | HR 96 | Ht 67.0 in | Wt 226.4 lb

## 2020-04-24 DIAGNOSIS — O039 Complete or unspecified spontaneous abortion without complication: Secondary | ICD-10-CM

## 2020-04-24 LAB — POCT PREGNANCY, URINE: Preg Test, Ur: POSITIVE — AB

## 2020-04-24 NOTE — Progress Notes (Signed)
   Subjective:   Patient Name: Julia Armstrong, female   DOB: 1990/09/15, 29 y.o.  MRN: [id number]  HPI Seen for follow up of SAB confirmed on 04/06/20 weeks ago. Still experiencing intermittent heavy bleeding but no cramping. Complains of feeling cold often but not fatigued or weak. Menses has not returned yet. Does not plan on becoming pregnant soon and would like Depo-Provera today. Feeling stable emotionally following her pregnancy loss.  Review of Systems Pertinent items are listed in the HPI, otherwise a comprehensive review of systems was negative.    Objective:   Vitals:   04/24/20 1554  BP: 118/71  Pulse: 96   Physical Exam  Constitutional: She is oriented to person, place, and time. She appears well-developed and well-nourished.  Cardiovascular: Normal rate.  Abdominal: Soft. She exhibits no distension.  Neurological: She is alert and oriented to person, place, and time.  Skin: Skin is warm and dry.  Psychiatric: She has a normal mood and affect. Her behavior is normal. Judgment and thought content normal.   HCG and CBC pending. UPT was positive.    Assessment & Plan:  1. SAB (spontaneous abortion) - Hemodynamically stable so HCG and CBC drawn today - Will follow up and manage accordingly, then get patient in for injectable contraception.  Edd Arbour, CNM, MSN, [facility] 04/24/20 4:34 PM

## 2020-04-25 LAB — CBC
Hematocrit: 33.4 % — ABNORMAL LOW (ref 34.0–46.6)
Hemoglobin: 10.7 g/dL — ABNORMAL LOW (ref 11.1–15.9)
MCH: 23.8 pg — ABNORMAL LOW (ref 26.6–33.0)
MCHC: 32 g/dL (ref 31.5–35.7)
MCV: 74 fL — ABNORMAL LOW (ref 79–97)
Platelets: 248 10*3/uL (ref 150–450)
RBC: 4.5 x10E6/uL (ref 3.77–5.28)
RDW: 15.4 % (ref 11.7–15.4)
WBC: 4.8 10*3/uL (ref 3.4–10.8)

## 2020-04-25 LAB — BETA HCG QUANT (REF LAB): hCG Quant: 558 m[IU]/mL

## 2020-05-02 ENCOUNTER — Telehealth: Payer: Self-pay | Admitting: Certified Nurse Midwife

## 2020-05-02 ENCOUNTER — Telehealth (INDEPENDENT_AMBULATORY_CARE_PROVIDER_SITE_OTHER): Payer: Medicaid Other | Admitting: Lactation Services

## 2020-05-02 DIAGNOSIS — O039 Complete or unspecified spontaneous abortion without complication: Secondary | ICD-10-CM

## 2020-05-02 NOTE — Telephone Encounter (Signed)
Returned patients call. Female answered the phone and reports patient is not there. He will have her call back.   Will route to Edd Arbour to see if follow up HCG warranted.

## 2020-05-02 NOTE — Telephone Encounter (Signed)
Patient called about continued bleeding and pain from miscarriage.    Patient then  reports she is not bleeding much now, discharge is brown when wiping only. She feels she has been bleeding longer than she was told she would. Reviewed it can take up to 6 weeks to stop bleeding after a miscarriage for some women.   Patient is having cramping that is 6/10, it is persistent since before miscarriage. She is not taking Tylenol or Ibuprofen. The cramping has not improved since August.   She has an appointment on 10/28 to follow up with Edd Arbour and patient would like to be seen sooner. Appointment made tomorrow for 10:55, patient informed.

## 2020-05-02 NOTE — Telephone Encounter (Signed)
Patient stated she was suppose to be receiving a call back form the provider, and never did. She is requesting a call back today.

## 2020-05-03 ENCOUNTER — Ambulatory Visit
Admission: RE | Admit: 2020-05-03 | Discharge: 2020-05-03 | Disposition: A | Discharge status: Home / Self Care | Payer: Medicaid Other | Source: Ambulatory Visit | Attending: Obstetrics & Gynecology | Admitting: Obstetrics & Gynecology

## 2020-05-03 ENCOUNTER — Other Ambulatory Visit: Payer: Self-pay | Admitting: Obstetrics & Gynecology

## 2020-05-03 ENCOUNTER — Other Ambulatory Visit: Payer: Self-pay

## 2020-05-03 ENCOUNTER — Ambulatory Visit (INDEPENDENT_AMBULATORY_CARE_PROVIDER_SITE_OTHER): Payer: Medicaid Other | Admitting: Obstetrics & Gynecology

## 2020-05-03 ENCOUNTER — Encounter: Payer: Self-pay | Admitting: Obstetrics & Gynecology

## 2020-05-03 VITALS — BP 113/78 | HR 71 | Wt 230.4 lb

## 2020-05-03 DIAGNOSIS — R1030 Lower abdominal pain, unspecified: Secondary | ICD-10-CM

## 2020-05-03 DIAGNOSIS — O039 Complete or unspecified spontaneous abortion without complication: Secondary | ICD-10-CM
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
## 2020-05-03 NOTE — Patient Instructions (Signed)
Dilation and Curettage or Vacuum Curettage, Care After These instructions give you information about caring for yourself after your procedure. Your doctor may also give you more specific instructions. Call your doctor if you have any problems or questions after your procedure. Follow these instructions at home: Activity  Do not drive or use heavy machinery while taking prescription pain medicine.  For 24 hours after your procedure, avoid driving.  Take short walks often, followed by rest periods. Ask your doctor what activities are safe for you. After one or two days, you may be able to return to your normal activities.  Do not lift anything that is heavier than 10 lb (4.5 kg) until your doctor approves.  For at least 2 weeks, or as long as told by your doctor: ? Do not douche. ? Do not use tampons. ? Do not have sex. General instructions   Take over-the-counter and prescription medicines only as told by your doctor. This is very important if you take blood thinning medicine.  Do not take baths, swim, or use a hot tub until your doctor approves. Take showers instead of baths.  Wear compression stockings as told by your doctor.  It is up to you to get the results of your procedure. Ask your doctor when your results will be ready.  Keep all follow-up visits as told by your doctor. This is important. Contact a doctor if:  You have very bad cramps that get worse or do not get better with medicine.  You have very bad pain in your belly (abdomen).  You cannot drink fluids without throwing up (vomiting).  You get pain in a different part of the area between your belly and thighs (pelvis).  You have bad-smelling discharge from your vagina.  You have a rash. Get help right away if:  You are bleeding a lot from your vagina. A lot of bleeding means soaking more than one sanitary pad in an hour, for 2 hours in a row.  You have clumps of blood (blood clots) coming from your  vagina.  You have a fever or chills.  Your belly feels very tender or hard.  You have chest pain.  You have trouble breathing.  You cough up blood.  You feel dizzy.  You feel light-headed.  You pass out (faint).  You have pain in your neck or shoulder area. Summary  Take short walks often, followed by rest periods. Ask your doctor what activities are safe for you. After one or two days, you may be able to return to your normal activities.  Do not lift anything that is heavier than 10 lb (4.5 kg) until your doctor approves.  Do not take baths, swim, or use a hot tub until your doctor approves. Take showers instead of baths.  Contact your doctor if you have any symptoms of infection, like bad-smelling discharge from your vagina. This information is not intended to replace advice given to you by your health care provider. Make sure you discuss any questions you have with your health care provider. Document Revised: 06/26/2017 Document Reviewed: 03/31/2016 Elsevier Patient Education  2020 [employer]  

## 2020-05-03 NOTE — Telephone Encounter (Signed)
Patient came into the office and had concerns addressed
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
## 2020-05-04 ENCOUNTER — Encounter (HOSPITAL_BASED_OUTPATIENT_CLINIC_OR_DEPARTMENT_OTHER): Payer: Self-pay | Admitting: Obstetrics & Gynecology

## 2020-05-04 ENCOUNTER — Other Ambulatory Visit: Payer: Self-pay

## 2020-05-05 ENCOUNTER — Other Ambulatory Visit (HOSPITAL_COMMUNITY)
Admission: RE | Admit: 2020-05-05 | Discharge: 2020-05-05 | Disposition: A | Discharge status: Home / Self Care | Payer: Medicaid Other | Source: Ambulatory Visit | Attending: Obstetrics & Gynecology | Admitting: Obstetrics & Gynecology

## 2020-05-05 DIAGNOSIS — Z01812 Encounter for preprocedural laboratory examination: Secondary | ICD-10-CM | POA: Insufficient documentation

## 2020-05-05 DIAGNOSIS — Z20822 Contact with and (suspected) exposure to covid-19: Secondary | ICD-10-CM | POA: Insufficient documentation

## 2020-05-05 LAB — SARS CORONAVIRUS 2 (TAT 6-24 HRS): SARS Coronavirus 2: NEGATIVE

## 2020-05-07 ENCOUNTER — Other Ambulatory Visit: Payer: Self-pay | Admitting: Obstetrics & Gynecology

## 2020-05-08 NOTE — Anesthesia Preprocedure Evaluation (Addendum)
Anesthesia Evaluation  Patient identified by MRN, date of birth, ID band Patient awake    Reviewed: Allergy & Precautions, NPO status , Patient's Chart, lab work & pertinent test results  History of Anesthesia Complications Negative for: history of anesthetic complications  Airway Mallampati: I  TM Distance: >3 FB Neck ROM: Full    Dental no notable dental hx. (+) Teeth Intact, Dental Advisory Given   Pulmonary neg pulmonary ROS,    Pulmonary exam normal breath sounds clear to auscultation       Cardiovascular negative cardio ROS Normal cardiovascular exam Rhythm:Regular Rate:Normal     Neuro/Psych negative neurological ROS  negative psych ROS   GI/Hepatic negative GI ROS, Neg liver ROS,   Endo/Other  negative endocrine ROSMorbid obesity  Renal/GU negative Renal ROS  negative genitourinary   Musculoskeletal negative musculoskeletal ROS (+)   Abdominal   Peds negative pediatric ROS (+)  Hematology negative hematology ROS (+)   Anesthesia Other Findings   Reproductive/Obstetrics negative OB ROS Hx of C/S x3                            Anesthesia Physical  Anesthesia Plan  ASA: II  Anesthesia Plan:    Post-op Pain Management:    Induction:   PONV Risk Score and Plan: 2 and Ondansetron, Dexamethasone and Treatment may vary due to age or medical condition  Airway Management Planned: Oral ETT and LMA  Additional Equipment: None  Intra-op Plan:   Post-operative Plan:   Informed Consent: I have reviewed the patients History and Physical, chart, labs and discussed the procedure including the risks, benefits and alternatives for the proposed anesthesia with the patient or authorized representative who has indicated his/her understanding and acceptance.       Plan Discussed with: CRNA, Surgeon and Anesthesiologist  Anesthesia Plan Comments:        Anesthesia Quick  Evaluation

## 2020-05-09 ENCOUNTER — Other Ambulatory Visit: Payer: Self-pay

## 2020-05-09 ENCOUNTER — Encounter (HOSPITAL_BASED_OUTPATIENT_CLINIC_OR_DEPARTMENT_OTHER)
Admission: RE | Disposition: A | Discharge status: Home / Self Care | Payer: Self-pay | Source: Home / Self Care | Attending: Obstetrics & Gynecology

## 2020-05-09 ENCOUNTER — Encounter (HOSPITAL_BASED_OUTPATIENT_CLINIC_OR_DEPARTMENT_OTHER): Payer: Self-pay | Admitting: Obstetrics & Gynecology

## 2020-05-09 ENCOUNTER — Ambulatory Visit (HOSPITAL_BASED_OUTPATIENT_CLINIC_OR_DEPARTMENT_OTHER): Payer: Medicaid Other | Admitting: Anesthesiology

## 2020-05-09 ENCOUNTER — Ambulatory Visit (HOSPITAL_BASED_OUTPATIENT_CLINIC_OR_DEPARTMENT_OTHER)
Admission: RE | Admit: 2020-05-09 | Discharge: 2020-05-09 | Disposition: A | Discharge status: Home / Self Care | Payer: Medicaid Other | Attending: Obstetrics & Gynecology | Admitting: Obstetrics & Gynecology

## 2020-05-09 DIAGNOSIS — Z6836 Body mass index (BMI) 36.0-36.9, adult: Secondary | ICD-10-CM | POA: Diagnosis not present

## 2020-05-09 DIAGNOSIS — O021 Missed abortion: Secondary | ICD-10-CM

## 2020-05-09 DIAGNOSIS — Z3A01 Less than 8 weeks gestation of pregnancy: Secondary | ICD-10-CM | POA: Diagnosis not present

## 2020-05-09 HISTORY — PX: DILATION AND EVACUATION: SHX1459

## 2020-05-09 HISTORY — DX: Missed abortion: O02.1

## 2020-05-09 SURGERY — DILATION AND EVACUATION, UTERUS
Anesthesia: General | Site: Vagina

## 2020-05-09 MED ORDER — FENTANYL CITRATE (PF) 100 MCG/2ML IJ SOLN
INTRAMUSCULAR | Status: AC
  Filled 2020-05-09: qty 2

## 2020-05-09 MED ORDER — LACTATED RINGERS IV SOLN: INTRAVENOUS | Status: DC

## 2020-05-09 MED ORDER — PROPOFOL 10 MG/ML IV BOLUS
INTRAVENOUS | Status: AC
  Filled 2020-05-09: qty 20

## 2020-05-09 MED ORDER — DEXAMETHASONE SODIUM PHOSPHATE 10 MG/ML IJ SOLN
INTRAMUSCULAR | Status: DC | PRN
  Administered 2020-05-09: 10 mg via INTRAVENOUS

## 2020-05-09 MED ORDER — OXYTOCIN 10 UNIT/ML IJ SOLN
INTRAMUSCULAR | Status: AC
  Filled 2020-05-09: qty 1

## 2020-05-09 MED ORDER — LIDOCAINE HCL (CARDIAC) PF 100 MG/5ML IV SOSY
PREFILLED_SYRINGE | INTRAVENOUS | Status: DC | PRN
  Administered 2020-05-09: 100 mg via INTRATRACHEAL

## 2020-05-09 MED ORDER — SILVER NITRATE-POT NITRATE 75-25 % EX MISC
CUTANEOUS | Status: AC
  Filled 2020-05-09: qty 10

## 2020-05-09 MED ORDER — PROPOFOL 10 MG/ML IV BOLUS
INTRAVENOUS | Status: DC | PRN
  Administered 2020-05-09: 200 mg via INTRAVENOUS

## 2020-05-09 MED ORDER — BUPIVACAINE HCL (PF) 0.5 % IJ SOLN
INTRAMUSCULAR | Status: AC
  Filled 2020-05-09: qty 30

## 2020-05-09 MED ORDER — BUPIVACAINE HCL (PF) 0.25 % IJ SOLN
INTRAMUSCULAR | Status: AC
  Filled 2020-05-09: qty 30

## 2020-05-09 MED ORDER — OXYCODONE HCL 5 MG PO TABS
5.0000 mg | ORAL_TABLET | Freq: Once | ORAL | Status: AC | PRN
  Administered 2020-05-09: 5 mg via ORAL

## 2020-05-09 MED ORDER — ONDANSETRON HCL 4 MG/2ML IJ SOLN
INTRAMUSCULAR | Status: AC
  Filled 2020-05-09: qty 2

## 2020-05-09 MED ORDER — DEXAMETHASONE SODIUM PHOSPHATE 10 MG/ML IJ SOLN
INTRAMUSCULAR | Status: AC
  Filled 2020-05-09: qty 1

## 2020-05-09 MED ORDER — MISOPROSTOL 200 MCG PO TABS
ORAL_TABLET | ORAL | Status: AC
  Filled 2020-05-09: qty 6

## 2020-05-09 MED ORDER — OXYCODONE HCL 5 MG PO TABS
ORAL_TABLET | ORAL | Status: AC
  Filled 2020-05-09: qty 1

## 2020-05-09 MED ORDER — OXYCODONE-ACETAMINOPHEN 5-325 MG PO TABS: 1.0000 | ORAL_TABLET | Freq: Four times a day (QID) | ORAL | 0 refills | Status: DC | PRN

## 2020-05-09 MED ORDER — MEPERIDINE HCL 25 MG/ML IJ SOLN: 6.2500 mg | INTRAMUSCULAR | Status: DC | PRN

## 2020-05-09 MED ORDER — BUPIVACAINE-EPINEPHRINE (PF) 0.5% -1:200000 IJ SOLN
INTRAMUSCULAR | Status: AC
  Filled 2020-05-09: qty 30

## 2020-05-09 MED ORDER — SODIUM CHLORIDE 0.9 % IV SOLN
INTRAVENOUS | Status: AC
  Filled 2020-05-09 (×2): qty 100

## 2020-05-09 MED ORDER — BUPIVACAINE HCL (PF) 0.25 % IJ SOLN
INTRAMUSCULAR | Status: DC | PRN
  Administered 2020-05-09: 10 mL

## 2020-05-09 MED ORDER — LIDOCAINE HCL (PF) 1 % IJ SOLN
INTRAMUSCULAR | Status: AC
  Filled 2020-05-09: qty 30

## 2020-05-09 MED ORDER — LIDOCAINE 2% (20 MG/ML) 5 ML SYRINGE
INTRAMUSCULAR | Status: AC
  Filled 2020-05-09: qty 5

## 2020-05-09 MED ORDER — METHYLERGONOVINE MALEATE 0.2 MG/ML IJ SOLN
INTRAMUSCULAR | Status: AC
  Filled 2020-05-09: qty 1

## 2020-05-09 MED ORDER — DOXYCYCLINE HYCLATE 100 MG IV SOLR
200.0000 mg | INTRAVENOUS | Status: AC
  Administered 2020-05-09: 200 mg via INTRAVENOUS
  Filled 2020-05-09: qty 200

## 2020-05-09 MED ORDER — OXYCODONE HCL 5 MG/5ML PO SOLN: 5.0000 mg | Freq: Once | ORAL | Status: AC | PRN

## 2020-05-09 MED ORDER — POVIDONE-IODINE 10 % EX SWAB
2.0000 "application " | Freq: Once | CUTANEOUS | Status: AC
  Administered 2020-05-09: 2 via TOPICAL

## 2020-05-09 MED ORDER — MIDAZOLAM HCL 2 MG/2ML IJ SOLN
INTRAMUSCULAR | Status: AC
  Filled 2020-05-09: qty 2

## 2020-05-09 MED ORDER — ONDANSETRON HCL 4 MG/2ML IJ SOLN
INTRAMUSCULAR | Status: DC | PRN
  Administered 2020-05-09: 4 mg via INTRAVENOUS

## 2020-05-09 MED ORDER — ACETAMINOPHEN 325 MG PO TABS: 325.0000 mg | ORAL_TABLET | ORAL | Status: DC | PRN

## 2020-05-09 MED ORDER — ACETAMINOPHEN 160 MG/5ML PO SOLN: 325.0000 mg | ORAL | Status: DC | PRN

## 2020-05-09 MED ORDER — FENTANYL CITRATE (PF) 100 MCG/2ML IJ SOLN
25.0000 ug | INTRAMUSCULAR | Status: DC | PRN
  Administered 2020-05-09 (×2): 25 ug via INTRAVENOUS

## 2020-05-09 MED ORDER — MIDAZOLAM HCL 5 MG/5ML IJ SOLN
INTRAMUSCULAR | Status: DC | PRN
  Administered 2020-05-09: 2 mg via INTRAVENOUS

## 2020-05-09 MED ORDER — ONDANSETRON HCL 4 MG/2ML IJ SOLN: 4.0000 mg | Freq: Once | INTRAMUSCULAR | Status: DC | PRN

## 2020-05-09 MED ORDER — CARBOPROST TROMETHAMINE 250 MCG/ML IM SOLN
INTRAMUSCULAR | Status: AC
  Filled 2020-05-09: qty 1

## 2020-05-09 MED ORDER — FENTANYL CITRATE (PF) 100 MCG/2ML IJ SOLN
INTRAMUSCULAR | Status: DC | PRN
Start: 2020-05-09 — End: 2020-05-10
  Administered 2020-05-09 (×2): 50 ug via INTRAVENOUS

## 2020-05-09 SURGICAL SUPPLY — 27 items
CATH ROBINSON RED A/P 14FR (CATHETERS) IMPLANT
DECANTER SPIKE VIAL GLASS SM (MISCELLANEOUS) IMPLANT
FILTER UTR ASPR ASSEMBLY (MISCELLANEOUS) IMPLANT
GAUZE 4X4 16PLY RFD (DISPOSABLE) ×2 IMPLANT
GLOVE BIO SURGEON STRL SZ 6.5 (GLOVE) ×2 IMPLANT
GLOVE BIOGEL PI IND STRL 6.5 (GLOVE) ×2 IMPLANT
GLOVE BIOGEL PI IND STRL 7.0 (GLOVE) ×2 IMPLANT
GLOVE BIOGEL PI INDICATOR 6.5 (GLOVE) ×2
GLOVE BIOGEL PI INDICATOR 7.0 (GLOVE) ×2
GLOVE ECLIPSE 6.5 STRL STRAW (GLOVE) ×2 IMPLANT
GOWN STRL REUS W/ TWL LRG LVL3 (GOWN DISPOSABLE) ×1 IMPLANT
GOWN STRL REUS W/TWL LRG LVL3 (GOWN DISPOSABLE) ×3 IMPLANT
HIBICLENS CHG 4% 4OZ BTL (MISCELLANEOUS) IMPLANT
HOSE CONNECTING 18IN BERKELEY (TUBING) ×2 IMPLANT
KIT BERKELEY 1ST TRI 3/8 NO TR (MISCELLANEOUS) ×2 IMPLANT
KIT BERKELEY 1ST TRIMESTER 3/8 (MISCELLANEOUS) ×2 IMPLANT
NS IRRIG 1000ML POUR BTL (IV SOLUTION) ×2 IMPLANT
PACK VAGINAL MINOR WOMEN LF (CUSTOM PROCEDURE TRAY) ×2 IMPLANT
PAD OB MATERNITY 4.3X12.25 (PERSONAL CARE ITEMS) ×2 IMPLANT
PAD PREP 24X48 CUFFED NSTRL (MISCELLANEOUS) ×2 IMPLANT
SET BERKELEY SUCTION TUBING (SUCTIONS) ×2 IMPLANT
SLEEVE SCD COMPRESS KNEE MED (MISCELLANEOUS) ×2 IMPLANT
TOWEL GREEN STERILE FF (TOWEL DISPOSABLE) ×2 IMPLANT
VACURETTE 10 RIGID CVD (CANNULA) IMPLANT
VACURETTE 7MM CVD STRL WRAP (CANNULA) IMPLANT
VACURETTE 8 RIGID CVD (CANNULA) IMPLANT
VACURETTE 9 RIGID CVD (CANNULA) ×2 IMPLANT

## 2020-05-09 NOTE — Op Note (Signed)
Julia Armstrong PROCEDURE DATE: 05/09/2020  PREOPERATIVE DIAGNOSIS: 6 week missed abortion POSTOPERATIVE DIAGNOSIS: The same PROCEDURE:     Dilation and Evacuation SURGEON:  Scheryl Darter MD  INDICATIONS: 29 y.o. [id number] with MAB at [redacted] weeks gestation, needing surgical completion.  Risks of surgery were discussed with the patient including but not limited to: bleeding which may require transfusion; infection which may require antibiotics; injury to uterus or surrounding organs; need for additional procedures including laparotomy or laparoscopy; possibility of intrauterine scarring which may impair future fertility; and other postoperative/anesthesia complications. Written informed consent was obtained.    FINDINGS:  A 6-8 week size uterus, moderate amounts of products of conception, specimen sent to pathology.  ANESTHESIA:    Monitored intravenous sedation, paracervical block. INTRAVENOUS FLUIDS:  500 ml of LR ESTIMATED BLOOD LOSS:  Less than 20 ml. SPECIMENS:  Products of conception sent to pathology COMPLICATIONS:  None immediate.  PROCEDURE DETAILS:  The patient received intravenous Doxycycline while in the preoperative area.  She was then taken to the operating room where monitored intravenous sedation was administered and was found to be adequate.  After an adequate timeout was performed, she was placed in the dorsal lithotomy position and examined; then prepped and draped in the sterile manner.   Her bladder was catheterized for an unmeasured amount of clear, yellow urine. A vaginal speculum was then placed in the patient's vagina and a single tooth tenaculum was applied to the anterior lip of the cervix.  A paracervical block using 10 ml of 0.25% Marcaine was administered. The cervix was gently dilated to accommodate a 9 mm suction curette that was gently advanced to the uterine fundus.  The suction device was then activated and curette slowly rotated to clear the uterus of products of  conception. There was minimal bleeding noted and the tenaculum removed with good hemostasis noted.   All instruments were removed from the patient's vagina.  Sponge and instrument counts were correct times two  The patient tolerated the procedure well and was taken to the recovery area awake, and in stable condition.  Adam Phenix, MD 05/09/2020 8:56 AM

## 2020-05-09 NOTE — Anesthesia Procedure Notes (Signed)
Procedure Name: LMA Insertion Date/Time: 05/09/2020 8:38 AM Performed by: Thornell Mule, CRNA Pre-anesthesia Checklist: Patient identified, Emergency Drugs available, Suction available and Patient being monitored Patient Re-evaluated:Patient Re-evaluated prior to induction Oxygen Delivery Method: Circle system utilized Preoxygenation: Pre-oxygenation with 100% oxygen Induction Type: IV induction Ventilation: Mask ventilation without difficulty LMA: LMA inserted LMA Size: 4.0 Number of attempts: 1 Placement Confirmation: positive ETCO2 Tube secured with: Tape Dental Injury: Teeth and Oropharynx as per pre-operative assessment

## 2020-05-09 NOTE — Interval H&P Note (Signed)
History and Physical Interval Note:  05/09/2020 8:13 AM  Julia Armstrong  has presented today for surgery, with the diagnosis of RETAINED POC.  The various methods of treatment have been discussed with the patient and family. After consideration of risks, benefits and other options for treatment, the patient has consented to  Procedure(s): DILATATION AND EVACUATION (N/A) as a surgical intervention.  The patient's history has been reviewed, patient examined, no change in status, stable for surgery.  I have reviewed the patient's chart and labs.  Questions were answered to the patient's satisfaction.     Scheryl Darter

## 2020-05-09 NOTE — Transfer of Care (Signed)
Immediate Anesthesia Transfer of Care Note  Patient: Julia Armstrong  Procedure(s) Performed: DILATATION AND EVACUATION (N/A Vagina )  Patient Location: PACU  Anesthesia Type:General  Level of Consciousness: drowsy, patient cooperative and responds to stimulation  Airway & Oxygen Therapy: Patient Spontanous Breathing and Patient connected to face mask oxygen  Post-op Assessment: Report given to RN and Post -op Vital signs reviewed and stable  Post vital signs: Reviewed and stable  Last Vitals:  Vitals Value Taken Time  BP 116/80 05/09/20 0900  Temp    Pulse 79 05/09/20 0901  Resp 21 05/09/20 0901  SpO2 100 % 05/09/20 0901  Vitals shown include unvalidated device data.  Last Pain:  Vitals:   05/09/20 0729  TempSrc: Oral  PainSc: 5       Patients Stated Pain Goal: 4 (05/09/20 0729)  Complications: No complications documented.

## 2020-05-09 NOTE — Discharge Instructions (Signed)
Dilation and Curettage or Vacuum Curettage, Care After These instructions give you information about caring for yourself after your procedure. Your doctor may also give you more specific instructions. Call your doctor if you have any problems or questions after your procedure. Follow these instructions at home: Activity  Do not drive or use heavy machinery while taking prescription pain medicine.  For 24 hours after your procedure, avoid driving.  Take short walks often, followed by rest periods. Ask your doctor what activities are safe for you. After one or two days, you may be able to return to your normal activities.  Do not lift anything that is heavier than 10 lb (4.5 kg) until your doctor approves.  For at least 2 weeks, or as long as told by your doctor: ? Do not douche. ? Do not use tampons. ? Do not have sex. General instructions   Take over-the-counter and prescription medicines only as told by your doctor. This is very important if you take blood thinning medicine.  Do not take baths, swim, or use a hot tub until your doctor approves. Take showers instead of baths.  It is up to you to get the results of your procedure. Ask your doctor when your results will be ready.  Keep all follow-up visits as told by your doctor. This is important. Contact a doctor if:  You have very bad cramps that get worse or do not get better with medicine.  You have very bad pain in your belly (abdomen).  You cannot drink fluids without throwing up (vomiting).  You get pain in a different part of the area between your belly and thighs (pelvis).  You have bad-smelling discharge from your vagina.  You have a rash. Get help right away if:  You are bleeding a lot from your vagina. A lot of bleeding means soaking more than one sanitary pad in an hour, for 2 hours in a row.  You have clumps of blood (blood clots) coming from your vagina.  You have a fever or chills.  Your belly feels very  tender or hard.  You have chest pain.  You have trouble breathing.  You cough up blood.  You feel dizzy.  You feel light-headed.  You pass out (faint).  You have pain in your neck or shoulder area. Summary  Take short walks often, followed by rest periods. Ask your doctor what activities are safe for you. After one or two days, you may be able to return to your normal activities.  Do not take baths, swim, or use a hot tub until your doctor approves. Take showers instead of baths.  Contact your doctor if you have any symptoms of infection, like bad-smelling discharge from your vagina. This information is not intended to replace advice given to you by your health care provider. Make sure you discuss any questions you have with your health care provider. Document Revised: 06/26/2017 Document Reviewed: 03/31/2016 Elsevier Patient Education  2020 [employer].    Post Anesthesia Home Care Instructions  Activity: Get plenty of rest for the remainder of the day. A responsible individual must stay with you for 24 hours following the procedure.  For the next 24 hours, DO NOT: -Drive a car -Advertising copywriter -Drink alcoholic beverages -Take any medication unless instructed by your physician -Make any legal decisions or sign important papers.  Meals: Start with liquid foods such as gelatin or soup. Progress to regular foods as tolerated. Avoid greasy, spicy, heavy foods. If nausea and/or  vomiting occur, drink only clear liquids until the nausea and/or vomiting subsides. Call your physician if vomiting continues.  Special Instructions/Symptoms: Your throat may feel dry or sore from the anesthesia or the breathing tube placed in your throat during surgery. If this causes discomfort, gargle with warm salt water. The discomfort should disappear within 24 hours.  If you had a scopolamine patch placed behind your ear for the management of post- operative nausea and/or vomiting:  1. The  medication in the patch is effective for 72 hours, after which it should be removed.  Wrap patch in a tissue and discard in the trash. Wash hands thoroughly with soap and water. 2. You may remove the patch earlier than 72 hours if you experience unpleasant side effects which may include dry mouth, dizziness or visual disturbances. 3. Avoid touching the patch. Wash your hands with soap and water after contact with the patch.

## 2020-05-10 ENCOUNTER — Encounter (HOSPITAL_BASED_OUTPATIENT_CLINIC_OR_DEPARTMENT_OTHER): Payer: Self-pay | Admitting: Obstetrics & Gynecology

## 2020-05-10 LAB — SURGICAL PATHOLOGY

## 2020-05-10 NOTE — Anesthesia Postprocedure Evaluation (Signed)
Anesthesia Post Note  Patient: Julia Armstrong  Procedure(s) Performed: DILATATION AND EVACUATION (N/A Vagina )     Patient location during evaluation: PACU Anesthesia Type: General Level of consciousness: awake and alert Pain management: pain level controlled Vital Signs Assessment: post-procedure vital signs reviewed and stable Respiratory status: spontaneous breathing, nonlabored ventilation, respiratory function stable and patient connected to nasal cannula oxygen Cardiovascular status: blood pressure returned to baseline and stable Postop Assessment: no apparent nausea or vomiting Anesthetic complications: no   No complications documented.  Last Vitals:  Vitals:   05/09/20 0940 05/09/20 1000  BP:  118/63  Pulse: 74 78  Resp: 19 16  Temp:  36.9 C  SpO2: 99% 100%    Last Pain:  Vitals:   05/09/20 1000  TempSrc:   PainSc: 6                  Lashaun Poch

## 2020-05-10 NOTE — Addendum Note (Signed)
Addendum  created 05/10/20 1222 by Bethena Midget, MD   Intraprocedure Event edited

## 2020-05-19 ENCOUNTER — Encounter (HOSPITAL_COMMUNITY): Payer: Self-pay | Admitting: Emergency Medicine

## 2020-05-19 ENCOUNTER — Emergency Department (HOSPITAL_COMMUNITY)
Admission: EM | Admit: 2020-05-19 | Discharge: 2020-05-20 | Disposition: A | Discharge status: Home / Self Care | Payer: Medicaid Other | Attending: Emergency Medicine | Admitting: Emergency Medicine

## 2020-05-19 DIAGNOSIS — Y92410 Unspecified street and highway as the place of occurrence of the external cause: Secondary | ICD-10-CM | POA: Insufficient documentation

## 2020-05-19 DIAGNOSIS — M546 Pain in thoracic spine: Secondary | ICD-10-CM | POA: Insufficient documentation

## 2020-05-19 DIAGNOSIS — M542 Cervicalgia: Secondary | ICD-10-CM | POA: Insufficient documentation

## 2020-05-19 DIAGNOSIS — R519 Headache, unspecified: Secondary | ICD-10-CM | POA: Insufficient documentation

## 2020-05-19 DIAGNOSIS — M7918 Myalgia, other site: Secondary | ICD-10-CM

## 2020-05-19 MED ORDER — CYCLOBENZAPRINE HCL 10 MG PO TABS
10.0000 mg | ORAL_TABLET | Freq: Once | ORAL | Status: AC
  Administered 2020-05-20: 10 mg via ORAL
  Filled 2020-05-19: qty 1

## 2020-05-19 MED ORDER — IBUPROFEN 400 MG PO TABS
600.0000 mg | ORAL_TABLET | Freq: Once | ORAL | Status: AC
  Administered 2020-05-20: 600 mg via ORAL
  Filled 2020-05-19: qty 1
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
## 2020-05-20 MED ORDER — IBUPROFEN 600 MG PO TABS: 600.0000 mg | ORAL_TABLET | Freq: Four times a day (QID) | ORAL | 0 refills | Status: DC | PRN

## 2020-05-20 MED ORDER — CYCLOBENZAPRINE HCL 10 MG PO TABS: 10.0000 mg | ORAL_TABLET | Freq: Two times a day (BID) | ORAL | 0 refills | Status: DC | PRN

## 2020-05-20 NOTE — Discharge Instructions (Addendum)
See your doctor for recheck if pain continues, or return to the emergency department with any new or worsening symptoms.

## 2020-05-24 ENCOUNTER — Ambulatory Visit: Payer: Medicaid Other | Admitting: Certified Nurse Midwife

## 2020-05-24 ENCOUNTER — Telehealth: Payer: Self-pay

## 2020-05-24 NOTE — Telephone Encounter (Signed)
Called pt to follow up on missed appt; unable to leave VM. MyChart message sent.

## 2020-06-07 ENCOUNTER — Ambulatory Visit: Payer: Medicaid Other | Admitting: Certified Nurse Midwife

## 2020-08-17 ENCOUNTER — Other Ambulatory Visit: Payer: Self-pay

## 2020-08-17 ENCOUNTER — Encounter (HOSPITAL_COMMUNITY): Payer: Self-pay | Admitting: Emergency Medicine

## 2020-08-17 ENCOUNTER — Emergency Department (HOSPITAL_COMMUNITY)
Admission: EM | Admit: 2020-08-17 | Discharge: 2020-08-17 | Disposition: A | Discharge status: Home / Self Care | Payer: Medicaid Other | Attending: Emergency Medicine | Admitting: Emergency Medicine

## 2020-08-17 ENCOUNTER — Emergency Department (HOSPITAL_COMMUNITY): Payer: Medicaid Other

## 2020-08-17 DIAGNOSIS — Z20822 Contact with and (suspected) exposure to covid-19: Secondary | ICD-10-CM | POA: Diagnosis not present

## 2020-08-17 DIAGNOSIS — R0789 Other chest pain: Secondary | ICD-10-CM | POA: Insufficient documentation

## 2020-08-17 DIAGNOSIS — R079 Chest pain, unspecified: Secondary | ICD-10-CM

## 2020-08-17 DIAGNOSIS — Z79899 Other long term (current) drug therapy: Secondary | ICD-10-CM | POA: Diagnosis not present

## 2020-08-17 DIAGNOSIS — R0602 Shortness of breath: Secondary | ICD-10-CM | POA: Diagnosis not present

## 2020-08-17 DIAGNOSIS — R6889 Other general symptoms and signs: Secondary | ICD-10-CM

## 2020-08-17 LAB — CBC WITH DIFFERENTIAL/PLATELET
Abs Immature Granulocytes: 0.02 10*3/uL (ref 0.00–0.07)
Basophils Absolute: 0 10*3/uL (ref 0.0–0.1)
Basophils Relative: 1 %
Eosinophils Absolute: 0 10*3/uL (ref 0.0–0.5)
Eosinophils Relative: 1 %
HCT: 38.4 % (ref 36.0–46.0)
Hemoglobin: 11.6 g/dL — ABNORMAL LOW (ref 12.0–15.0)
Immature Granulocytes: 0 %
Lymphocytes Relative: 36 %
Lymphs Abs: 1.8 10*3/uL (ref 0.7–4.0)
MCH: 22.9 pg — ABNORMAL LOW (ref 26.0–34.0)
MCHC: 30.2 g/dL (ref 30.0–36.0)
MCV: 75.7 fL — ABNORMAL LOW (ref 80.0–100.0)
Monocytes Absolute: 0.4 10*3/uL (ref 0.1–1.0)
Monocytes Relative: 7 %
Neutro Abs: 2.8 10*3/uL (ref 1.7–7.7)
Neutrophils Relative %: 55 %
Platelets: 248 10*3/uL (ref 150–400)
RBC: 5.07 MIL/uL (ref 3.87–5.11)
RDW: 16.1 % — ABNORMAL HIGH (ref 11.5–15.5)
WBC: 5.1 10*3/uL (ref 4.0–10.5)
nRBC: 0 % (ref 0.0–0.2)

## 2020-08-17 LAB — BASIC METABOLIC PANEL
Anion gap: 11 (ref 5–15)
BUN: 8 mg/dL (ref 6–20)
CO2: 23 mmol/L (ref 22–32)
Calcium: 9 mg/dL (ref 8.9–10.3)
Chloride: 103 mmol/L (ref 98–111)
Creatinine, Ser: 0.64 mg/dL (ref 0.44–1.00)
GFR, Estimated: >60 mL/min (ref >60)
Glucose, Bld: 108 mg/dL — ABNORMAL HIGH (ref 70–99)
Potassium: 4 mmol/L (ref 3.5–5.1)
Sodium: 137 mmol/L (ref 135–145)

## 2020-08-17 LAB — TROPONIN I (HIGH SENSITIVITY): Troponin I (High Sensitivity): 3 ng/L (ref <18)

## 2020-08-17 LAB — I-STAT BETA HCG BLOOD, ED (MC, WL, AP ONLY): I-stat hCG, quantitative: <5 m[IU]/mL (ref <5)

## 2020-08-17 LAB — SARS CORONAVIRUS 2 (TAT 6-24 HRS): SARS Coronavirus 2: NEGATIVE
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
## 2020-08-17 NOTE — ED Triage Notes (Signed)
Chest pain started 3 days ago, no congestion or SOB. Pt describes it as stabbing with numbness going down to her fingertips bilaterally

## 2020-08-17 NOTE — Discharge Instructions (Addendum)

## 2020-09-03 ENCOUNTER — Other Ambulatory Visit: Payer: Self-pay

## 2020-09-03 ENCOUNTER — Ambulatory Visit (HOSPITAL_COMMUNITY)
Admission: EM | Admit: 2020-09-03 | Discharge: 2020-09-03 | Disposition: A | Discharge status: Home / Self Care | Payer: Medicaid Other | Attending: Family Medicine | Admitting: Family Medicine

## 2020-09-03 ENCOUNTER — Encounter (HOSPITAL_COMMUNITY): Payer: Self-pay | Admitting: Emergency Medicine

## 2020-09-03 DIAGNOSIS — N939 Abnormal uterine and vaginal bleeding, unspecified: Secondary | ICD-10-CM | POA: Insufficient documentation

## 2020-09-03 DIAGNOSIS — R109 Unspecified abdominal pain: Secondary | ICD-10-CM | POA: Diagnosis present

## 2020-09-03 DIAGNOSIS — R531 Weakness: Secondary | ICD-10-CM | POA: Diagnosis not present

## 2020-09-03 DIAGNOSIS — R110 Nausea: Secondary | ICD-10-CM

## 2020-09-03 DIAGNOSIS — Z20822 Contact with and (suspected) exposure to covid-19: Secondary | ICD-10-CM | POA: Insufficient documentation

## 2020-09-03 DIAGNOSIS — R200 Anesthesia of skin: Secondary | ICD-10-CM

## 2020-09-03 LAB — COMPREHENSIVE METABOLIC PANEL
ALT: 17 U/L (ref 0–44)
AST: 18 U/L (ref 15–41)
Albumin: 3.3 g/dL — ABNORMAL LOW (ref 3.5–5.0)
Alkaline Phosphatase: 62 U/L (ref 38–126)
Anion gap: 10 (ref 5–15)
BUN: 9 mg/dL (ref 6–20)
CO2: 26 mmol/L (ref 22–32)
Calcium: 9.3 mg/dL (ref 8.9–10.3)
Chloride: 104 mmol/L (ref 98–111)
Creatinine, Ser: 0.83 mg/dL (ref 0.44–1.00)
GFR, Estimated: >60 mL/min (ref >60)
Glucose, Bld: 95 mg/dL (ref 70–99)
Potassium: 4.3 mmol/L (ref 3.5–5.1)
Sodium: 140 mmol/L (ref 135–145)
Total Bilirubin: 0.3 mg/dL (ref 0.3–1.2)
Total Protein: 7 g/dL (ref 6.5–8.1)

## 2020-09-03 LAB — POCT URINALYSIS DIPSTICK, ED / UC
Bilirubin Urine: NEGATIVE
Glucose, UA: NEGATIVE mg/dL
Hgb urine dipstick: NEGATIVE
Ketones, ur: NEGATIVE mg/dL
Leukocytes,Ua: NEGATIVE
Nitrite: NEGATIVE
Protein, ur: NEGATIVE mg/dL
Specific Gravity, Urine: 1.025 (ref 1.005–1.030)
Urobilinogen, UA: 0.2 mg/dL (ref 0.0–1.0)
pH: 7 (ref 5.0–8.0)

## 2020-09-03 LAB — CBC WITH DIFFERENTIAL/PLATELET
Abs Immature Granulocytes: 0.02 10*3/uL (ref 0.00–0.07)
Basophils Absolute: 0 10*3/uL (ref 0.0–0.1)
Basophils Relative: 1 %
Eosinophils Absolute: 0 10*3/uL (ref 0.0–0.5)
Eosinophils Relative: 1 %
HCT: 36.6 % (ref 36.0–46.0)
Hemoglobin: 11.6 g/dL — ABNORMAL LOW (ref 12.0–15.0)
Immature Granulocytes: 0 %
Lymphocytes Relative: 40 %
Lymphs Abs: 2 10*3/uL (ref 0.7–4.0)
MCH: 24.1 pg — ABNORMAL LOW (ref 26.0–34.0)
MCHC: 31.7 g/dL (ref 30.0–36.0)
MCV: 76.1 fL — ABNORMAL LOW (ref 80.0–100.0)
Monocytes Absolute: 0.3 10*3/uL (ref 0.1–1.0)
Monocytes Relative: 7 %
Neutro Abs: 2.5 10*3/uL (ref 1.7–7.7)
Neutrophils Relative %: 51 %
Platelets: 307 10*3/uL (ref 150–400)
RBC: 4.81 MIL/uL (ref 3.87–5.11)
RDW: 16.1 % — ABNORMAL HIGH (ref 11.5–15.5)
WBC: 4.8 10*3/uL (ref 4.0–10.5)
nRBC: 0 % (ref 0.0–0.2)

## 2020-09-03 LAB — POC URINE PREG, ED: Preg Test, Ur: NEGATIVE

## 2020-09-03 LAB — TSH: TSH: 0.966 u[IU]/mL (ref 0.350–4.500)

## 2020-09-03 LAB — VITAMIN B12: Vitamin B-12: 333 pg/mL (ref 180–914)

## 2020-09-03 MED ORDER — ONDANSETRON 4 MG PO TBDP: 4.0000 mg | ORAL_TABLET | Freq: Three times a day (TID) | ORAL | 0 refills | Status: DC | PRN
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
## 2020-09-04 LAB — SARS CORONAVIRUS 2 (TAT 6-24 HRS): SARS Coronavirus 2: NEGATIVE

## 2020-09-28 ENCOUNTER — Ambulatory Visit (HOSPITAL_COMMUNITY)
Admission: EM | Admit: 2020-09-28 | Discharge: 2020-09-28 | Disposition: A | Discharge status: Home / Self Care | Payer: Medicaid Other | Attending: Family Medicine | Admitting: Family Medicine

## 2020-09-28 ENCOUNTER — Other Ambulatory Visit: Payer: Self-pay

## 2020-09-28 ENCOUNTER — Encounter (HOSPITAL_COMMUNITY): Payer: Self-pay

## 2020-09-28 DIAGNOSIS — S161XXA Strain of muscle, fascia and tendon at neck level, initial encounter: Secondary | ICD-10-CM | POA: Diagnosis not present

## 2020-09-28 DIAGNOSIS — M542 Cervicalgia: Secondary | ICD-10-CM

## 2020-09-28 DIAGNOSIS — S39012A Strain of muscle, fascia and tendon of lower back, initial encounter: Secondary | ICD-10-CM

## 2020-09-28 MED ORDER — METHOCARBAMOL 500 MG PO TABS: 500.0000 mg | ORAL_TABLET | Freq: Two times a day (BID) | ORAL | 0 refills | Status: DC

## 2020-09-28 MED ORDER — IBUPROFEN 800 MG PO TABS: 800.0000 mg | ORAL_TABLET | Freq: Three times a day (TID) | ORAL | 0 refills | Status: DC | PRN

## 2020-09-28 NOTE — Discharge Instructions (Signed)
I have sent in ibuprofen for you to take one tablet every 8 hours as needed for pain and inflammation.  I  have sent in Robaxin for you to use twice a day as needed for muscle spasms  Follow up with this office or with primary care if symptoms are persisting.  Follow up in the ER for high fever, trouble swallowing, trouble breathing, other concerning symptoms.
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
## 2020-10-02 ENCOUNTER — Ambulatory Visit (INDEPENDENT_AMBULATORY_CARE_PROVIDER_SITE_OTHER): Payer: Medicaid Other

## 2020-10-02 ENCOUNTER — Ambulatory Visit (HOSPITAL_COMMUNITY)
Admission: EM | Admit: 2020-10-02 | Discharge: 2020-10-02 | Disposition: A | Discharge status: Home / Self Care | Payer: Medicaid Other | Attending: Family Medicine | Admitting: Family Medicine

## 2020-10-02 ENCOUNTER — Other Ambulatory Visit: Payer: Self-pay

## 2020-10-02 ENCOUNTER — Encounter (HOSPITAL_COMMUNITY): Payer: Self-pay | Admitting: *Deleted

## 2020-10-02 DIAGNOSIS — R079 Chest pain, unspecified: Secondary | ICD-10-CM | POA: Diagnosis not present

## 2020-10-02 DIAGNOSIS — S161XXD Strain of muscle, fascia and tendon at neck level, subsequent encounter: Secondary | ICD-10-CM

## 2020-10-02 DIAGNOSIS — R0789 Other chest pain: Secondary | ICD-10-CM | POA: Diagnosis not present

## 2020-10-02 MED ORDER — PREDNISONE 20 MG PO TABS: 40.0000 mg | ORAL_TABLET | Freq: Every day | ORAL | 0 refills | Status: DC
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
## 2020-10-16 ENCOUNTER — Ambulatory Visit (HOSPITAL_COMMUNITY)
Admission: EM | Admit: 2020-10-16 | Discharge: 2020-10-16 | Disposition: A | Discharge status: Home / Self Care | Payer: Medicaid Other | Attending: Student | Admitting: Student

## 2020-10-16 ENCOUNTER — Other Ambulatory Visit: Payer: Self-pay

## 2020-10-16 ENCOUNTER — Encounter (HOSPITAL_COMMUNITY): Payer: Self-pay

## 2020-10-16 DIAGNOSIS — H00024 Hordeolum internum left upper eyelid: Secondary | ICD-10-CM

## 2020-10-16 MED ORDER — POLYMYXIN B-TRIMETHOPRIM 10000-0.1 UNIT/ML-% OP SOLN: 1.0000 [drp] | OPHTHALMIC | 0 refills | Status: AC

## 2020-10-16 NOTE — ED Provider Notes (Signed)
[facility]    CSN: [id number] Arrival date & time: 10/16/20  1046      History   Chief Complaint Chief Complaint  Patient presents with  . Eye Problem    HPI Julia Armstrong is a 30 y.o. female presenting with L eye foreign body sensation. Medical history noncontributory. Wears glasses but not contacts. Thinks something fell in her eye last night but isn't sure. Has flushed her eye out multiple times but the foreign body sensation persists. Denies photophobia, eye redness, eye crusting in the morning, eye pain, eye pain with movement, injury to eye, vision changes, double vision, excessive tearing, burning eyes. Denies URI symptoms. Has been evaluated by ED for something similar in the past and states her symptoms never totally resolved.     HPI  Past Medical History:  Diagnosis Date  . Gallstones   . Wound infection after surgery 2016   c-section incision had to be reopened and wound vac placed    Patient Active Problem List   Diagnosis Date Noted  . Missed abortion 05/09/2020  . 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 Status post repeat low transverse cesarean section 05/10/2018  . Drug use 02/26/2018  . Syncope 02/16/2018  . Obesity (BMI 35.0-39.9 without comorbidity) 12/01/2017  . Supervision of other normal pregnancy, antepartum 11/03/2017  . Previous cesarean delivery, antepartum 11/03/2017  . History of depression 11/03/2017  . Second degree burn of multiple fingers of right hand including thumb 10/09/2016  . Wound infection after surgery 02/28/2015  . Other known or suspected fetal abnormality, not elsewhere classified, affecting management of mother, antepartum condition or complication 02/05/2012    Past Surgical History:  Procedure Laterality Date  . CESAREAN SECTION     C/S x 3  . CESAREAN SECTION N/A 05/10/2018   Procedure: REPEAT CESAREAN SECTION;  Surgeon:  Bing, MD;  Location: [facility];  Service: Obstetrics;   Laterality: N/A;  . DILATION AND EVACUATION N/A 02/18/2017   Procedure: DILATATION AND EVACUATION;  Surgeon: Tereso Newcomer, MD;  Location: [facility];  Service: Gynecology;  Laterality: N/A;  . DILATION AND EVACUATION N/A 05/09/2020   Procedure: DILATATION AND EVACUATION;  Surgeon: Adam Phenix, MD;  Location: [facility];  Service: Gynecology;  Laterality: N/A;  . WISDOM TOOTH EXTRACTION      OB History    Gravida  7   Para  4   Term  4   Preterm      AB  3   Living  4     SAB  3   IAB      Ectopic      Multiple  0   Live Births  4            Home Medications    Prior to Admission medications   Medication Sig Start Date End Date Taking? Authorizing Provider  trimethoprim-polymyxin b (POLYTRIM) ophthalmic solution Place 1 drop into the left eye every 4 (four) hours for 7 days. 1-2 drops in L eye every 4 hours while awake for 7 days 10/16/20 10/23/20 Yes Rhys Martini, PA-C  albuterol (VENTOLIN HFA) 108 (90 Base) MCG/ACT inhaler Inhale 2 puffs into the lungs every 6 (six) hours as needed for wheezing or shortness of breath. 12/15/19   Lamptey, Britta Mccreedy, MD  cyclobenzaprine (FLEXERIL) 10 MG tablet Take 1 tablet (10 mg total) by mouth 2 (two) times daily as needed for muscle spasms. 05/20/20   Elpidio Anis, PA-C  ibuprofen (ADVIL) 800 MG tablet Take 1 tablet (800 mg total) by mouth every 8 (eight) hours as needed for moderate pain. 09/28/20   Moshe Cipro, NP  methocarbamol (ROBAXIN) 500 MG tablet Take 1 tablet (500 mg total) by mouth 2 (two) times daily. 09/28/20   Moshe Cipro, NP  ondansetron (ZOFRAN ODT) 4 MG disintegrating tablet Take 1 tablet (4 mg total) by mouth every 8 (eight) hours as needed for nausea or vomiting. 09/03/20   Particia Nearing, PA-C  oxyCODONE-acetaminophen (PERCOCET/ROXICET) 5-325 MG tablet Take 1-2 tablets by mouth every 6 (six) hours as needed. 05/09/20   Adam Phenix, MD  predniSONE (DELTASONE) 20 MG tablet  Take 2 tablets (40 mg total) by mouth daily with breakfast. 10/02/20   Particia Nearing, PA-C    Family History Family History  Problem Relation Age of Onset  . Cancer Other   . Cancer Maternal Grandmother        breast cancer  . Healthy Mother   . Healthy Father     Social History Social History   Tobacco Use  . Smoking status: Never Smoker  . Smokeless tobacco: Never Used  Vaping Use  . Vaping Use: Never used  Substance Use Topics  . Alcohol use: Not Currently  . Drug use: No     Allergies   Patient has no known allergies.   Review of Systems Review of Systems  Eyes: Negative for photophobia, pain, discharge, redness, itching and visual disturbance.       Foreign body sensation L eye  All other systems reviewed and are negative.    Physical Exam Triage Vital Signs ED Triage Vitals [10/16/20 1203]  Enc Vitals Group     BP 120/80     Pulse Rate 74     Resp 18     Temp 97.9 F (36.6 C)     Temp Source Oral     SpO2 98 %     Weight      Height      Head Circumference      Peak Flow      Pain Score      Pain Loc      Pain Edu?      Excl. in GC?    No data found.  Updated Vital Signs BP 120/80 (BP Location: Left Arm)   Pulse 74   Temp 97.9 F (36.6 C) (Oral)   Resp 18   LMP 09/21/2020 (Approximate)   SpO2 98%   Visual Acuity Right Eye Distance:   Left Eye Distance:   Bilateral Distance:    Right Eye Near: R Near: 20/40 without glasses Left Eye Near:  L Near: 20/40 without glasses Bilateral Near:     Physical Exam Vitals reviewed.  Constitutional:      Appearance: Normal appearance.  HENT:     Head: Normocephalic and atraumatic.     Mouth/Throat:     Mouth: Mucous membranes are moist.     Pharynx: No posterior oropharyngeal erythema.  Eyes:     General: Lids are normal. Lids are everted, no foreign bodies appreciated. Vision grossly intact. Gaze aligned appropriately. No visual field deficit.       Right eye: No foreign body,  discharge or hordeolum.        Left eye: Hordeolum present.No foreign body or discharge.     Extraocular Movements: Extraocular movements intact.     Right eye: Normal extraocular motion and no nystagmus.     Left  eye: Normal extraocular motion and no nystagmus.     Conjunctiva/sclera:     Right eye: Right conjunctiva is not injected. No chemosis, exudate or hemorrhage.    Left eye: Left conjunctiva is not injected. No chemosis, exudate or hemorrhage.    Pupils: Pupils are equal, round, and reactive to light.     Visual Fields: Right eye visual fields normal and left eye visual fields normal.     Comments: L eye- absolutely no conjunctival injection, subconjunctival hemorrhage, foreign body.   Cardiovascular:     Rate and Rhythm: Normal rate and regular rhythm.     Heart sounds: Normal heart sounds.  Pulmonary:     Effort: Pulmonary effort is normal.     Breath sounds: Normal breath sounds.  Skin:    General: Skin is warm.  Neurological:     General: No focal deficit present.     Mental Status: She is alert and oriented to person, place, and time.  Psychiatric:        Mood and Affect: Mood normal.        Behavior: Behavior normal.        Thought Content: Thought content normal.        Judgment: Judgment normal.      UC Treatments / Results  Labs (all labs ordered are listed, but only abnormal results are displayed) Labs Reviewed - No data to display  EKG   Radiology No results found.  Procedures Procedures (including critical care time)  Medications Ordered in UC Medications - No data to display  Initial Impression / Assessment and Plan / UC Course  I have reviewed the triage vital signs and the nursing notes.  Pertinent labs & imaging results that were available during my care of the patient were reviewed by me and considered in my medical decision making (see chart for details).     This patient is a 29 year old female presenting with hordeolum internum.    Visual acuity intact. She wears glasses but not contacts. Given visual acuity intact, absolutely no corneal injection, flourescein deferred today.  Plan to treat with polytrim drops as below.   Given recurrent nature of eye issues, rec f/u with optho.  Return precautions discussed.  This chart was dictated using voice recognition software, Dragon. Despite the best efforts of this provider to proofread and correct errors, errors may still occur which can change documentation meaning.  Final Clinical Impressions(s) / UC Diagnoses   Final diagnoses:  Hordeolum internum of left upper eyelid     Discharge Instructions     -Start the antibiotic drops- (Polytrim) 1-2 drops in L eye every 4 hours while awake for 7 days -You can also do warm compresses -Follow-up with ophthalmology if symptoms worsen/persist. Follow up immediately if you have any new symptoms like decreased vision, vision loss, a flash of light or curtain in your vision, swelling of eyelid despite treatment. Information below.     ED Prescriptions    Medication Sig Dispense Auth. Provider   trimethoprim-polymyxin b (POLYTRIM) ophthalmic solution Place 1 drop into the left eye every 4 (four) hours for 7 days. 1-2 drops in L eye every 4 hours while awake for 7 days 10 mL Rhys Martini, PA-C     PDMP not reviewed this encounter.   Rhys Martini, PA-C 10/16/20 1310

## 2020-10-16 NOTE — Discharge Instructions (Signed)
-  Start the antibiotic drops- (Polytrim) 1-2 drops in L eye every 4 hours while awake for 7 days -You can also do warm compresses -Follow-up with ophthalmology if symptoms worsen/persist. Follow up immediately if you have any new symptoms like decreased vision, vision loss, a flash of light or curtain in your vision, swelling of eyelid despite treatment. Information below.

## 2020-10-16 NOTE — ED Triage Notes (Signed)
Pt presents with left eye irritation since yesterday; pt states it feels like something is in her eye.

## 2020-12-25 ENCOUNTER — Other Ambulatory Visit: Payer: Self-pay

## 2020-12-25 ENCOUNTER — Ambulatory Visit (HOSPITAL_COMMUNITY)
Admission: EM | Admit: 2020-12-25 | Discharge: 2020-12-25 | Disposition: A | Discharge status: Home / Self Care | Payer: Medicaid Other | Attending: Family Medicine | Admitting: Family Medicine

## 2020-12-25 ENCOUNTER — Encounter (HOSPITAL_COMMUNITY): Payer: Self-pay | Admitting: *Deleted

## 2020-12-25 DIAGNOSIS — R1032 Left lower quadrant pain: Secondary | ICD-10-CM

## 2020-12-25 DIAGNOSIS — R112 Nausea with vomiting, unspecified: Secondary | ICD-10-CM

## 2020-12-25 LAB — POCT URINALYSIS DIPSTICK, ED / UC
Bilirubin Urine: NEGATIVE
Glucose, UA: NEGATIVE mg/dL
Ketones, ur: NEGATIVE mg/dL
Leukocytes,Ua: NEGATIVE
Nitrite: NEGATIVE
Protein, ur: NEGATIVE mg/dL
Specific Gravity, Urine: 1.02 (ref 1.005–1.030)
Urobilinogen, UA: 0.2 mg/dL (ref 0.0–1.0)
pH: 7 (ref 5.0–8.0)

## 2020-12-25 LAB — POC URINE PREG, ED: Preg Test, Ur: NEGATIVE

## 2020-12-25 MED ORDER — ONDANSETRON 4 MG PO TBDP
4.0000 mg | ORAL_TABLET | Freq: Once | ORAL | Status: AC
  Administered 2020-12-25: 4 mg via ORAL

## 2020-12-25 MED ORDER — ONDANSETRON 4 MG PO TBDP
ORAL_TABLET | ORAL | Status: AC
  Filled 2020-12-25: qty 1

## 2020-12-25 MED ORDER — KETOROLAC TROMETHAMINE 60 MG/2ML IM SOLN
INTRAMUSCULAR | Status: AC
  Filled 2020-12-25: qty 2

## 2020-12-25 MED ORDER — KETOROLAC TROMETHAMINE 60 MG/2ML IM SOLN
60.0000 mg | Freq: Once | INTRAMUSCULAR | Status: AC
  Administered 2020-12-25: 60 mg via INTRAMUSCULAR

## 2020-12-25 NOTE — ED Triage Notes (Signed)
Pt reports Sx's started over a week ago. ABD pain ,lower back pain and HA.

## 2020-12-25 NOTE — ED Provider Notes (Signed)
[facility]   [id number] 12/25/20 Arrival Time: 0940  ASSESSMENT & PLAN:  1. Abdominal pain, left lower quadrant   2. Non-intractable vomiting with nausea, unspecified vomiting type     Benign abdominal exam. No indications for urgent abdominal/pelvic imaging at this time. Discussed possibility of ovarian cyst; she has had one as a teenager.  Meds ordered this encounter  Medications  . ketorolac (TORADOL) injection 60 mg  . ondansetron (ZOFRAN-ODT) disintegrating tablet 4 mg     Discharge Instructions     You have been seen today for abdominal pain. Your evaluation was not suggestive of any emergent condition requiring medical intervention at this time. However, some abdominal problems make take more time to appear. Therefore, it is very important for you to pay attention to any new symptoms or worsening of your current condition.  Please return here or to the Emergency Department immediately should you begin to feel worse in any way or have any of the following symptoms: increasing or different abdominal pain, persistent vomiting, inability to drink fluids, fevers, or shaking chills.       Follow-up Information    Schedule an appointment as soon as possible for a visit  with [facility].   Specialty: Obstetrics and Gynecology Contact information: [address] [place] [address] [phone]       [facility] EMERGENCY DEPARTMENT.   Specialty: Emergency Medicine Why: If symptoms worsen in any way. Contact information: [address] [id number] [address]c [place] [address] [phone]              Reviewed expectations re: course of current medical issues. Questions answered. Outlined signs and symptoms indicating need for more acute intervention. Patient verbalized understanding. After Visit Summary given.   SUBJECTIVE: History from:  patient. Julia Armstrong is a 30 y.o. female who presents with complaint of intermittent LLQ abdominal pain. Onset gradual, first noted over past 1-2 weeks. Discomfort described as dull with occasional sharp pain; without radiation; does not wake her at night. Reports normal flatus. Symptoms are unchanged since beginning. Fever: absent. Aggravating factors: have not been identified. Alleviating factors: have not been identified. Associated symptoms: occasional nausea without emesis. She denies fever. Appetite: normal. PO intake: normal. Ambulatory without assistance. Urinary symptoms: none. No vaginal discharge. Bowel movements: have not significantly changed.  Patient's last menstrual period was 11/13/2020.   Past Surgical History:  Procedure Laterality Date  . CESAREAN SECTION     C/S x 3  . CESAREAN SECTION N/A 05/10/2018   Procedure: REPEAT CESAREAN SECTION;  Surgeon: Burke Bing, MD;  Location: [facility];  Service: Obstetrics;  Laterality: N/A;  . DILATION AND EVACUATION N/A 02/18/2017   Procedure: DILATATION AND EVACUATION;  Surgeon: Tereso Newcomer, MD;  Location: [facility];  Service: Gynecology;  Laterality: N/A;  . DILATION AND EVACUATION N/A 05/09/2020   Procedure: DILATATION AND EVACUATION;  Surgeon: Adam Phenix, MD;  Location: [facility];  Service: Gynecology;  Laterality: N/A;  . WISDOM TOOTH EXTRACTION       OBJECTIVE:  Vitals:   12/25/20 1025  BP: 109/70  Pulse: 68  Resp: 18  Temp: 98.8 F (37.1 C)  TempSrc: Oral  SpO2: 100%    General appearance: alert, oriented, no acute distress HEENT: ; AT; oropharynx moist Lungs: unlabored respirations Abdomen: soft; without distention; mild  and poorly localized tenderness to palpation over LLQ; normal bowel sounds; without masses  or organomegaly; without guarding or rebound tenderness Back: without reported CVA tenderness; FROM at waist Extremities: without LE edema; symmetrical; without  gross deformities Skin: warm and dry Neurologic: normal gait Psychological: alert and cooperative; normal mood and affect  Labs: Results for orders placed or performed during the hospital encounter of 12/25/20  POCT Urinalysis Dipstick (ED/UC)  Result Value Ref Range   Glucose, UA NEGATIVE NEGATIVE mg/dL   Bilirubin Urine NEGATIVE NEGATIVE   Ketones, ur NEGATIVE NEGATIVE mg/dL   Specific Gravity, Urine 1.020 1.005 - 1.030   Hgb urine dipstick TRACE (A) NEGATIVE   pH 7.0 5.0 - 8.0   Protein, ur NEGATIVE NEGATIVE mg/dL   Urobilinogen, UA 0.2 0.0 - 1.0 mg/dL   Nitrite NEGATIVE NEGATIVE   Leukocytes,Ua NEGATIVE NEGATIVE  POC urine preg, ED (not at [facility])  Result Value Ref Range   Preg Test, Ur NEGATIVE NEGATIVE   Labs Reviewed  POCT URINALYSIS DIPSTICK, ED / UC - Abnormal; Notable for the following components:      Result Value   Hgb urine dipstick TRACE (*)    All other components within normal limits  POC URINE PREG, ED    Imaging: No results found.   No Known Allergies                                             Past Medical History:  Diagnosis Date  . Gallstones   . Wound infection after surgery 2016   c-section incision had to be reopened and wound vac placed    Social History   Socioeconomic History  . Marital status: Significant Other    Spouse name: Not on file  . Number of children: Not on file  . Years of education: Not on file  . Highest education level: Not on file  Occupational History  . Not on file  Tobacco Use  . Smoking status: Never Smoker  . Smokeless tobacco: Never Used  Vaping Use  . Vaping Use: Never used  Substance and Sexual Activity  . Alcohol use: Not Currently  . Drug use: No  . Sexual activity: Not Currently    Birth control/protection: None  Other Topics Concern  . Not on file  Social History Narrative  . Not on file   Social Determinants of Health   Financial Resource Strain: Not on file  Food Insecurity: No Food  Insecurity  . Worried About Programme researcher, broadcasting/film/video in the Last Year: Never true  . Ran Out of Food in the Last Year: Never true  Transportation Needs: No Transportation Needs  . Lack of Transportation (Medical): No  . Lack of Transportation (Non-Medical): No  Physical Activity: Not on file  Stress: Not on file  Social Connections: Not on file  Intimate Partner Violence: Not on file    Family History  Problem Relation Age of Onset  . Cancer Other   . Cancer Maternal Grandmother        breast cancer  . Healthy Mother   . Healthy Father      Mardella Layman, MD 12/25/20 [phone]

## 2020-12-25 NOTE — Discharge Instructions (Signed)

## 2021-04-04 ENCOUNTER — Encounter (HOSPITAL_COMMUNITY): Payer: Self-pay | Admitting: Emergency Medicine

## 2021-04-04 ENCOUNTER — Other Ambulatory Visit: Payer: Self-pay

## 2021-04-04 ENCOUNTER — Ambulatory Visit (HOSPITAL_COMMUNITY)
Admission: EM | Admit: 2021-04-04 | Discharge: 2021-04-04 | Disposition: A | Discharge status: Home / Self Care | Payer: Medicaid Other | Attending: Emergency Medicine | Admitting: Emergency Medicine

## 2021-04-04 DIAGNOSIS — R319 Hematuria, unspecified: Secondary | ICD-10-CM

## 2021-04-04 DIAGNOSIS — R1030 Lower abdominal pain, unspecified: Secondary | ICD-10-CM

## 2021-04-04 LAB — POCT URINALYSIS DIPSTICK, ED / UC
Bilirubin Urine: NEGATIVE
Glucose, UA: NEGATIVE mg/dL
Leukocytes,Ua: NEGATIVE
Nitrite: NEGATIVE
Protein, ur: NEGATIVE mg/dL
Specific Gravity, Urine: 1.02 (ref 1.005–1.030)
Urobilinogen, UA: 0.2 mg/dL (ref 0.0–1.0)
pH: 7 (ref 5.0–8.0)

## 2021-04-04 LAB — HIV ANTIBODY (ROUTINE TESTING W REFLEX): HIV Screen 4th Generation wRfx: NONREACTIVE

## 2021-04-04 LAB — POC URINE PREG, ED: Preg Test, Ur: NEGATIVE

## 2021-04-04 MED ORDER — CEPHALEXIN 500 MG PO CAPS: 500.0000 mg | ORAL_CAPSULE | Freq: Two times a day (BID) | ORAL | 0 refills | Status: AC

## 2021-04-04 NOTE — ED Triage Notes (Signed)
PT reports headache for 1 week  Abdominal pain and nausea for 3 days

## 2021-04-04 NOTE — Discharge Instructions (Addendum)
Your urinalysis today was concerning for possible urinary tract infection.  Your urine was sent out to the lab for culture and you will be notified of the results once they are received.  I have sent a prescription for antibiotics which I would like for you to start now.  Your STI screening test results will be provided to you once they are received as well.  Please follow-up right away if you have increasing abdominal pain, fever, worsening headache.
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
## 2021-04-05 LAB — CERVICOVAGINAL ANCILLARY ONLY
Bacterial Vaginitis (gardnerella): POSITIVE — AB
Candida Glabrata: NEGATIVE
Candida Vaginitis: NEGATIVE
Chlamydia: NEGATIVE
Comment: NEGATIVE
Comment: NEGATIVE
Comment: NEGATIVE
Comment: NEGATIVE
Comment: NEGATIVE
Comment: NORMAL
Neisseria Gonorrhea: NEGATIVE
Trichomonas: NEGATIVE

## 2021-04-05 LAB — RPR: RPR Ser Ql: NONREACTIVE

## 2021-04-06 LAB — URINE CULTURE

## 2021-04-08 ENCOUNTER — Telehealth (HOSPITAL_COMMUNITY): Payer: Self-pay | Admitting: Emergency Medicine

## 2021-04-08 MED ORDER — METRONIDAZOLE 500 MG PO TABS: 500.0000 mg | ORAL_TABLET | Freq: Two times a day (BID) | ORAL | 0 refills | Status: DC

## 2021-04-09 ENCOUNTER — Telehealth (HOSPITAL_COMMUNITY): Payer: Self-pay | Admitting: Emergency Medicine

## 2021-04-09 NOTE — Telephone Encounter (Signed)
Opened in error

## 2021-04-12 ENCOUNTER — Encounter (HOSPITAL_COMMUNITY): Payer: Self-pay

## 2021-04-12 ENCOUNTER — Ambulatory Visit (HOSPITAL_COMMUNITY)
Admission: EM | Admit: 2021-04-12 | Discharge: 2021-04-12 | Disposition: A | Discharge status: Home / Self Care | Payer: Medicaid Other | Attending: Emergency Medicine | Admitting: Emergency Medicine

## 2021-04-12 ENCOUNTER — Other Ambulatory Visit: Payer: Self-pay

## 2021-04-12 DIAGNOSIS — Z20822 Contact with and (suspected) exposure to covid-19: Secondary | ICD-10-CM | POA: Diagnosis not present

## 2021-04-12 DIAGNOSIS — B3731 Acute candidiasis of vulva and vagina: Secondary | ICD-10-CM

## 2021-04-12 DIAGNOSIS — H66001 Acute suppurative otitis media without spontaneous rupture of ear drum, right ear: Secondary | ICD-10-CM | POA: Diagnosis not present

## 2021-04-12 DIAGNOSIS — R5081 Fever presenting with conditions classified elsewhere: Secondary | ICD-10-CM | POA: Insufficient documentation

## 2021-04-12 DIAGNOSIS — R509 Fever, unspecified: Secondary | ICD-10-CM

## 2021-04-12 DIAGNOSIS — B373 Candidiasis of vulva and vagina: Secondary | ICD-10-CM | POA: Diagnosis not present

## 2021-04-12 LAB — SARS CORONAVIRUS 2 (TAT 6-24 HRS): SARS Coronavirus 2: NEGATIVE

## 2021-04-12 MED ORDER — FLUCONAZOLE 150 MG PO TABS: ORAL_TABLET | ORAL | 0 refills | Status: DC

## 2021-04-12 MED ORDER — AMOXICILLIN 500 MG PO CAPS: 500.0000 mg | ORAL_CAPSULE | Freq: Two times a day (BID) | ORAL | 0 refills | Status: AC

## 2021-04-12 NOTE — Discharge Instructions (Addendum)
You received a COVID test as your employer requested.  I do believe that your symptoms are more related to you having a metal ear infection in your right ear.  I sent a prescription for amoxicillin to your pharmacy.  Please take this twice daily for the next 10 days.  More than likely, you will get a vaginal yeast infection from taking this antibiotic so I have preemptively sent you a prescription for Diflucan as well.  I recommend that you take the first tablet on day 7 of amoxicillin and take the second tablet on day 10.  Follow-up either with [place] or your primary care provider if you do not have improvement of your symptoms in the next 7 days.  Be sure to finish all 10 days of the antibiotic to avoid the risk of having an undertreated infection in your ear.

## 2021-04-12 NOTE — ED Triage Notes (Signed)
Pt presents with fever since this morning. States she took fever reducer. States her job requested for her to take a COVID test. Pt states she had stomach pain today and headache.
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
## 2021-05-01 ENCOUNTER — Encounter (HOSPITAL_COMMUNITY): Payer: Self-pay | Admitting: Obstetrics and Gynecology

## 2021-05-01 ENCOUNTER — Other Ambulatory Visit: Payer: Self-pay

## 2021-05-01 ENCOUNTER — Inpatient Hospital Stay (HOSPITAL_COMMUNITY)
Admission: AD | Admit: 2021-05-01 | Discharge: 2021-05-01 | Disposition: A | Discharge status: Home / Self Care | Payer: Medicaid Other | Attending: Obstetrics and Gynecology | Admitting: Obstetrics and Gynecology

## 2021-05-01 ENCOUNTER — Inpatient Hospital Stay (HOSPITAL_COMMUNITY): Payer: Medicaid Other

## 2021-05-01 DIAGNOSIS — O26899 Other specified pregnancy related conditions, unspecified trimester: Secondary | ICD-10-CM

## 2021-05-01 DIAGNOSIS — Z3490 Encounter for supervision of normal pregnancy, unspecified, unspecified trimester: Secondary | ICD-10-CM

## 2021-05-01 DIAGNOSIS — G44209 Tension-type headache, unspecified, not intractable: Secondary | ICD-10-CM

## 2021-05-01 DIAGNOSIS — R109 Unspecified abdominal pain: Secondary | ICD-10-CM | POA: Insufficient documentation

## 2021-05-01 DIAGNOSIS — R519 Headache, unspecified: Secondary | ICD-10-CM | POA: Insufficient documentation

## 2021-05-01 DIAGNOSIS — R1031 Right lower quadrant pain: Secondary | ICD-10-CM | POA: Diagnosis not present

## 2021-05-01 DIAGNOSIS — O26891 Other specified pregnancy related conditions, first trimester: Secondary | ICD-10-CM | POA: Diagnosis not present

## 2021-05-01 DIAGNOSIS — Z3A08 8 weeks gestation of pregnancy: Secondary | ICD-10-CM | POA: Diagnosis not present

## 2021-05-01 DIAGNOSIS — R030 Elevated blood-pressure reading, without diagnosis of hypertension: Secondary | ICD-10-CM | POA: Diagnosis not present

## 2021-05-01 LAB — URINALYSIS, ROUTINE W REFLEX MICROSCOPIC
Bilirubin Urine: NEGATIVE
Glucose, UA: NEGATIVE mg/dL
Hgb urine dipstick: NEGATIVE
Ketones, ur: NEGATIVE mg/dL
Leukocytes,Ua: NEGATIVE
Nitrite: NEGATIVE
Protein, ur: NEGATIVE mg/dL
Specific Gravity, Urine: 1.018 (ref 1.005–1.030)
pH: 6 (ref 5.0–8.0)

## 2021-05-01 LAB — CBC
HCT: 39.7 % (ref 36.0–46.0)
Hemoglobin: 12.3 g/dL (ref 12.0–15.0)
MCH: 24.2 pg — ABNORMAL LOW (ref 26.0–34.0)
MCHC: 31 g/dL (ref 30.0–36.0)
MCV: 78.1 fL — ABNORMAL LOW (ref 80.0–100.0)
Platelets: 265 10*3/uL (ref 150–400)
RBC: 5.08 MIL/uL (ref 3.87–5.11)
RDW: 15.7 % — ABNORMAL HIGH (ref 11.5–15.5)
WBC: 5.2 10*3/uL (ref 4.0–10.5)
nRBC: 0 % (ref 0.0–0.2)

## 2021-05-01 LAB — WET PREP, GENITAL
Clue Cells Wet Prep HPF POC: NONE SEEN
Sperm: NONE SEEN
Trich, Wet Prep: NONE SEEN
Yeast Wet Prep HPF POC: NONE SEEN

## 2021-05-01 LAB — HCG, QUANTITATIVE, PREGNANCY: hCG, Beta Chain, Quant, S: 4631 m[IU]/mL — ABNORMAL HIGH (ref <5)

## 2021-05-01 LAB — POCT PREGNANCY, URINE: Preg Test, Ur: POSITIVE — AB

## 2021-05-01 MED ORDER — LACTATED RINGERS IV BOLUS
1000.0000 mL | Freq: Once | INTRAVENOUS | Status: AC
  Administered 2021-05-01: 1000 mL via INTRAVENOUS

## 2021-05-01 MED ORDER — PRENATAL VITAMIN 27-0.8 MG PO TABS: 1.0000 | ORAL_TABLET | Freq: Every day | ORAL | 11 refills | Status: DC

## 2021-05-01 MED ORDER — DIPHENHYDRAMINE HCL 50 MG/ML IJ SOLN
25.0000 mg | Freq: Once | INTRAMUSCULAR | Status: AC
  Administered 2021-05-01: 25 mg via INTRAVENOUS
  Filled 2021-05-01: qty 1

## 2021-05-01 MED ORDER — METOCLOPRAMIDE HCL 5 MG/ML IJ SOLN
10.0000 mg | Freq: Once | INTRAMUSCULAR | Status: AC
  Administered 2021-05-01: 10 mg via INTRAVENOUS
  Filled 2021-05-01: qty 2

## 2021-05-01 NOTE — MAU Provider Note (Signed)
History     CSN: [id number]  Arrival date and time: 05/01/21 4098   Event Date/Time   First Provider Initiated Contact with Patient 05/01/21 0933      Chief Complaint  Patient presents with   Hypertension   Abdominal Pain   Possible Pregnancy   30 y.o. [id number] @[redacted]w[redacted]d  by LMP presenting with HA, elevated BP, and abd pain. Reports BP of 130/80 last night and was 148/120 at work today. No hx of HTN. HA started yesterday around noon. Describes as throbbing. Associated sx are seeing floaters. Rates pain 10/10. Abdominal pain started yesterday afternoon. Describes as sharp and intermittent in RLQ. Worse with movement. Associated sx are nausea. Denies other GI sx. No urinary sx. No VB.    OB History     Gravida  8   Para  4   Term  4   Preterm      AB  3   Living  4      SAB  3   IAB      Ectopic      Multiple  0   Live Births  4           Past Medical History:  Diagnosis Date   Gallstones    Wound infection after surgery 2016   c-section incision had to be reopened and wound vac placed    Past Surgical History:  Procedure Laterality Date   CESAREAN SECTION     C/S x 3   CESAREAN SECTION N/A 05/10/2018   Procedure: REPEAT CESAREAN SECTION;  Surgeon: 05/12/2018, MD;  Location: [facility]IRTHING SU[facility];  Service: Obstetrics;  Laterality: N/A;   DILATION AND EVACUATION N/A 02/18/2017   Procedure: DILATATION AND EVACUATION;  Surgeon: 02/20/2017, MD;  Location: [facility];  Service: Gynecology;  Laterality: N/A;   DILATION AND EVACUATION N/A 05/09/2020   Procedure: DILATATION AND EVACUATION;  Surgeon: 05/11/2020, MD;  Location: [facility];  Service: Gynecology;  Laterality: N/A;   WISDOM TOOTH EXTRACTION      Family History  Problem Relation Age of Onset   Cancer Other    Cancer Maternal Grandmother        breast cancer   Healthy Mother    Healthy Father     Social History   Tobacco Use   Smoking status: Never   Smokeless  tobacco: Never  Vaping Use   Vaping Use: Never used  Substance Use Topics   Alcohol use: Not Currently   Drug use: No    Allergies: No Known Allergies  Medications Prior to Admission  Medication Sig Dispense Refill Last Dose   albuterol (VENTOLIN HFA) 108 (90 Base) MCG/ACT inhaler Inhale 2 puffs into the lungs every 6 (six) hours as needed for wheezing or shortness of breath. 18 g 0    cyclobenzaprine (FLEXERIL) 10 MG tablet Take 1 tablet (10 mg total) by mouth 2 (two) times daily as needed for muscle spasms. 20 tablet 0    fluconazole (DIFLUCAN) 150 MG tablet Take 1 tablet on day 7 of antibiotic therapy, take second tablet on day 10. 2 tablet 0    ibuprofen (ADVIL) 800 MG tablet Take 1 tablet (800 mg total) by mouth every 8 (eight) hours as needed for moderate pain. 21 tablet 0    methocarbamol (ROBAXIN) 500 MG tablet Take 1 tablet (500 mg total) by mouth 2 (two) times daily. 20 tablet 0    metroNIDAZOLE (FLAGYL) 500 MG tablet Take 1 tablet (  500 mg total) by mouth 2 (two) times daily. 14 tablet 0    ondansetron (ZOFRAN ODT) 4 MG disintegrating tablet Take 1 tablet (4 mg total) by mouth every 8 (eight) hours as needed for nausea or vomiting. 21 tablet 0    oxyCODONE-acetaminophen (PERCOCET/ROXICET) 5-325 MG tablet Take 1-2 tablets by mouth every 6 (six) hours as needed. 10 tablet 0    predniSONE (DELTASONE) 20 MG tablet Take 2 tablets (40 mg total) by mouth daily with breakfast. 10 tablet 0     Review of Systems  Constitutional:  Negative for chills and fever.  Eyes:  Positive for visual disturbance.  Gastrointestinal:  Positive for abdominal pain and nausea. Negative for constipation, diarrhea and vomiting.  Genitourinary:  Negative for dysuria, frequency, hematuria, urgency and vaginal bleeding.  Neurological:  Positive for headaches.  Physical Exam   Blood pressure 128/68, pulse 70, temperature 98.3 F (36.8 C), temperature source Oral, resp. rate 16, height 5\' 7"  (1.702 m), weight  106.6 kg, last menstrual period 03/01/2021, SpO2 100 %. Patient Vitals for the past 24 hrs:  BP Temp Temp src Pulse Resp SpO2 Height Weight  05/01/21 1220 128/68 -- -- 70 -- -- -- --  05/01/21 0920 -- -- -- -- -- 100 % -- --  05/01/21 0919 (!) 123/59 -- -- 68 -- -- -- --  05/01/21 0848 140/83 98.3 F (36.8 C) Oral 80 16 100 % 5\' 7"  (1.702 m) 106.6 kg    Physical Exam Vitals and nursing note reviewed.  Constitutional:      General: She is not in acute distress (appears comfortable).    Appearance: Normal appearance.  HENT:     Head: Normocephalic and atraumatic.  Eyes:     Extraocular Movements: Extraocular movements intact.     Pupils: Pupils are equal, round, and reactive to light.  Cardiovascular:     Rate and Rhythm: Normal rate.  Pulmonary:     Effort: Pulmonary effort is normal. No respiratory distress.  Abdominal:     General: There is no distension.     Palpations: Abdomen is soft. There is no mass.     Tenderness: There is no abdominal tenderness. There is no guarding or rebound.     Hernia: No hernia is present.  Musculoskeletal:        General: Normal range of motion.     Cervical back: Normal range of motion.  Skin:    General: Skin is warm and dry.  Neurological:     General: No focal deficit present.     Mental Status: She is alert and oriented to person, place, and time.     Cranial Nerves: No cranial nerve deficit.  Psychiatric:        Mood and Affect: Mood normal.        Behavior: Behavior normal.   Results for orders placed or performed during the hospital encounter of 05/01/21 (from the past 24 hour(s))  Urinalysis, Routine w reflex microscopic Urine, Clean Catch     Status: Abnormal   Collection Time: 05/01/21  8:58 AM  Result Value Ref Range   Color, Urine YELLOW YELLOW   APPearance HAZY (A) CLEAR   Specific Gravity, Urine 1.018 1.005 - 1.030   pH 6.0 5.0 - 8.0   Glucose, UA NEGATIVE NEGATIVE mg/dL   Hgb urine dipstick NEGATIVE NEGATIVE    Bilirubin Urine NEGATIVE NEGATIVE   Ketones, ur NEGATIVE NEGATIVE mg/dL   Protein, ur NEGATIVE NEGATIVE mg/dL   Nitrite NEGATIVE NEGATIVE  Leukocytes,Ua NEGATIVE NEGATIVE  Pregnancy, urine POC     Status: Abnormal   Collection Time: 05/01/21  8:58 AM  Result Value Ref Range   Preg Test, Ur POSITIVE (A) NEGATIVE  CBC     Status: Abnormal   Collection Time: 05/01/21 10:04 AM  Result Value Ref Range   WBC 5.2 4.0 - 10.5 K/uL   RBC 5.08 3.87 - 5.11 MIL/uL   Hemoglobin 12.3 12.0 - 15.0 g/dL   HCT 11.9 14.7 - 82.9 %   MCV 78.1 (L) 80.0 - 100.0 fL   MCH 24.2 (L) 26.0 - 34.0 pg   MCHC 31.0 30.0 - 36.0 g/dL   RDW 56.2 (H) 13.0 - 86.5 %   Platelets 265 150 - 400 K/uL   nRBC 0.0 0.0 - 0.2 %  hCG, quantitative, pregnancy     Status: Abnormal   Collection Time: 05/01/21 10:05 AM  Result Value Ref Range   hCG, Beta Chain, Quant, S 4,631 (H) <5 mIU/mL  Wet prep, genital     Status: Abnormal   Collection Time: 05/01/21 10:23 AM   Specimen: Vaginal  Result Value Ref Range   Yeast Wet Prep HPF POC NONE SEEN NONE SEEN   Trich, Wet Prep NONE SEEN NONE SEEN   Clue Cells Wet Prep HPF POC NONE SEEN NONE SEEN   WBC, Wet Prep HPF POC FEW (A) NONE SEEN   Sperm NONE SEEN    US OB LESS THAN 14 WEEKS WITH OB TRANSVAGINAL  Result Date: 05/01/2021 CLINICAL DATA:  Abdominal and pelvic pain for 1 day. EXAM: OBSTETRIC <14 WK [place] AND TRANSVAGINAL OB US TECHNIQUE: Both transabdominal and transvaginal ultrasound examinations were performed for complete evaluation of the gestation as well as the maternal uterus, adnexal regions, and pelvic cul-de-sac. Transvaginal technique was performed to assess early pregnancy. COMPARISON:  None. FINDINGS: Intrauterine gestational sac: Single Yolk sac:  Visualized. Embryo:  Not Visualized. MSD: 5 mm   5 w   1 d Subchorionic hemorrhage:  None visualized. Maternal uterus/adnexae: Retroflexed uterus noted. Both ovaries are normal in appearance. No adnexal mass identified. Tiny  amount of simple free fluid noted. IMPRESSION: Single early approximately 5 week intrauterine gestational sac. Consider correlation with serial b-hCG levels, and followup ultrasound to assess viability in 10 days. No evidence of adnexal mass or hemoperitoneum. Electronically Signed   By: Danae Orleans M.D.   On: 05/01/2021 11:30    MAU Course  Procedures LR Benadryl Reglan  MDM Labs and [place] ordered and reviewed. Normal neuro exam. HA improved after meds, rates pain 3/10 now. Early IUP w/o FP seen on [place]. Intake BP elevated then was normotensive thereafter, pt to monitor with home BP cuff. Stable for discharge home.   Assessment and Plan   1. Early stage of pregnancy   2. Abdominal pain affecting pregnancy   3. Acute non intractable tension-type headache    Discharge home Follow up at [facility] to start care Rx PNV Return precautions  Allergies as of 05/01/2021   No Known Allergies      Medication List     STOP taking these medications    fluconazole 150 MG tablet Commonly known as: Diflucan   ibuprofen 800 MG tablet Commonly known as: ADVIL   methocarbamol 500 MG tablet Commonly known as: ROBAXIN   metroNIDAZOLE 500 MG tablet Commonly known as: FLAGYL   ondansetron 4 MG disintegrating tablet Commonly known as: Zofran ODT   oxyCODONE-acetaminophen 5-325 MG tablet Commonly known as: PERCOCET/ROXICET  predniSONE 20 MG tablet Commonly known as: DELTASONE       TAKE these medications    albuterol 108 (90 Base) MCG/ACT inhaler Commonly known as: VENTOLIN HFA Inhale 2 puffs into the lungs every 6 (six) hours as needed for wheezing or shortness of breath.   cyclobenzaprine 10 MG tablet Commonly known as: FLEXERIL Take 1 tablet (10 mg total) by mouth 2 (two) times daily as needed for muscle spasms.   Prenatal Vitamin 27-0.8 MG Tabs Take 1 tablet by mouth daily.        Donette Larry, CNM 05/01/2021, 12:59 PM

## 2021-05-01 NOTE — MAU Note (Signed)
Pt reports she has had a headache since yesterday. Checked b/p last night 130/80, this am at work it was 148/120. Also reports lower abd pain since yesterday , denies bleeding. LMP 03/01/2021, positive home preg test x 4.

## 2021-05-01 NOTE — MAU Provider Note (Signed)
History     CSN: [id number]  Arrival date and time: 05/01/21 4235   Event Date/Time   First Provider Initiated Contact with Patient 05/01/21 [phone]      Chief Complaint  Patient presents with   Hypertension   Abdominal Pain   Possible Pregnancy   Julia Armstrong is a 30 yo female who presents for hypertension, headache, abdominal pain, and positive home pregnancy tests x 4. [id number]. Reports LMP of 03/01/21. Has not yet received prenatal care, but she is planning to go to [facility].   Hypertension - patient reports BP of 130/80 last night and 148/120 at work today. She denies history of hypertension or taking antihypertensives.  Headache started yesterday around noon, states it feels like a throbbing pain. Reports "seeing stars" this morning and feeling lightheaded. Denies history of migraines. Rates pain as 10/10.  Abdominal pain started early yesterday afternoon. She describes it as sharp, intermittent RLQ abdominal pain. States she sometimes feels it in LLQ as well. Pain is worse with movement. Reports nausea. Denies vomiting, diarrhea, constipation. Denies vaginal bleeding, discharge, dysuria. Reports vulvovaginal area "feels a little raw" with resolving yeast infection.     OB History     Gravida  8   Para  4   Term  4   Preterm      AB  3   Living  4      SAB  3   IAB      Ectopic      Multiple  0   Live Births  4           Past Medical History:  Diagnosis Date   Gallstones    Wound infection after surgery 2016   c-section incision had to be reopened and wound vac placed    Past Surgical History:  Procedure Laterality Date   CESAREAN SECTION     C/S x 3   CESAREAN SECTION N/A 05/10/2018   Procedure: REPEAT CESAREAN SECTION;  Surgeon: Rio Blanco Bing, MD;  Location: [facility];  Service: Obstetrics;  Laterality: N/A;   DILATION AND EVACUATION N/A 02/18/2017   Procedure: DILATATION AND EVACUATION;  Surgeon: Tereso Newcomer, MD;   Location: [facility];  Service: Gynecology;  Laterality: N/A;   DILATION AND EVACUATION N/A 05/09/2020   Procedure: DILATATION AND EVACUATION;  Surgeon: Adam Phenix, MD;  Location:  SURGERY CENTER;  Service: Gynecology;  Laterality: N/A;   WISDOM TOOTH EXTRACTION      Family History  Problem Relation Age of Onset   Cancer Other    Cancer Maternal Grandmother        breast cancer   Healthy Mother    Healthy Father     Social History   Tobacco Use   Smoking status: Never   Smokeless tobacco: Never  Vaping Use   Vaping Use: Never used  Substance Use Topics   Alcohol use: Not Currently   Drug use: No    Allergies: No Known Allergies  Medications Prior to Admission  Medication Sig Dispense Refill Last Dose   albuterol (VENTOLIN HFA) 108 (90 Base) MCG/ACT inhaler Inhale 2 puffs into the lungs every 6 (six) hours as needed for wheezing or shortness of breath. 18 g 0    cyclobenzaprine (FLEXERIL) 10 MG tablet Take 1 tablet (10 mg total) by mouth 2 (two) times daily as needed for muscle spasms. 20 tablet 0    fluconazole (DIFLUCAN) 150 MG tablet Take 1 tablet on day 7 of antibiotic  therapy, take second tablet on day 10. 2 tablet 0    ibuprofen (ADVIL) 800 MG tablet Take 1 tablet (800 mg total) by mouth every 8 (eight) hours as needed for moderate pain. 21 tablet 0    methocarbamol (ROBAXIN) 500 MG tablet Take 1 tablet (500 mg total) by mouth 2 (two) times daily. 20 tablet 0    metroNIDAZOLE (FLAGYL) 500 MG tablet Take 1 tablet (500 mg total) by mouth 2 (two) times daily. 14 tablet 0    ondansetron (ZOFRAN ODT) 4 MG disintegrating tablet Take 1 tablet (4 mg total) by mouth every 8 (eight) hours as needed for nausea or vomiting. 21 tablet 0    oxyCODONE-acetaminophen (PERCOCET/ROXICET) 5-325 MG tablet Take 1-2 tablets by mouth every 6 (six) hours as needed. 10 tablet 0    predniSONE (DELTASONE) 20 MG tablet Take 2 tablets (40 mg total) by mouth daily with breakfast. 10 tablet 0      Review of Systems  HENT:  Negative for congestion and sore throat.   Respiratory:  Negative for shortness of breath.   Physical Exam   Blood pressure (!) 123/59, pulse 68, temperature 98.3 F (36.8 C), temperature source Oral, resp. rate 16, height 5\' 7"  (1.702 m), weight 106.6 kg, last menstrual period 03/01/2021, SpO2 100 %.  Physical Exam Vitals and nursing note reviewed.  Constitutional:      Appearance: She is well-developed.  HENT:     Head: Normocephalic and atraumatic.  Eyes:     Extraocular Movements: Extraocular movements intact.     Pupils: Pupils are equal, round, and reactive to light.  Cardiovascular:     Rate and Rhythm: Regular rhythm.  Pulmonary:     Effort: Pulmonary effort is normal.     Breath sounds: Normal breath sounds.  Abdominal:     General: Bowel sounds are normal. There is no distension.     Palpations: Abdomen is soft.     Tenderness: There is no abdominal tenderness. There is no guarding or rebound. Negative signs include Rovsing's sign and McBurney's sign.  Skin:    General: Skin is warm and dry.  Neurological:     General: No focal deficit present.     Mental Status: She is alert and oriented to person, place, and time.     Cranial Nerves: No cranial nerve deficit.  Psychiatric:        Mood and Affect: Mood normal.        Behavior: Behavior normal.    MAU Course  Procedures  MDM UPT positive, bHCG and CBC ordered. Ultrasound ordered to assess pregnancy location. Neuro exam reassuring, will order headache cocktail, IV fluids, and reevaluate. Do not suspect appendicitis without abdominal tenderness, rebound, or fever.   Assessment and Plan   Pregnancy less than 8 weeks - UPT positive. bHCG of 4,631. CBC unremarkable. Transvaginal ultrasound showed IUP with gestational sac measuring [redacted]w[redacted]d with yolk sac visualized, fetal pole not yet visualized. Patient will call to set up initial OB appointment with [facility] after discharge.  Elevated  blood pressure reading - blood pressure stable in the MAU. Elevated BP at work may be due to pain today. Continue to monitor at home/work and prenatal visits. Non-intractable headache - Reglan, Benadryl, and lactated ringer's bolus completed and patient's headache significantly improved. Advised to use Tylenol as needed or return if pain is not well controlled at home.  RLQ abdominal pain - ectopic pregnancy ruled out on [redacted]w[redacted]d. UA normal. Vaginal swabs pending. Wet prep normal.  Patient advised to return for new or worsening symptoms.   Julia Armstrong 05/01/2021, 9:57 AM

## 2021-05-01 NOTE — Discharge Instructions (Signed)

## 2021-05-02 LAB — GC/CHLAMYDIA PROBE AMP (~~LOC~~) NOT AT ARMC
Chlamydia: NEGATIVE
Comment: NEGATIVE
Comment: NORMAL
Neisseria Gonorrhea: NEGATIVE

## 2021-05-21 ENCOUNTER — Telehealth (INDEPENDENT_AMBULATORY_CARE_PROVIDER_SITE_OTHER): Payer: Medicaid Other

## 2021-05-21 DIAGNOSIS — Z136 Encounter for screening for cardiovascular disorders: Secondary | ICD-10-CM

## 2021-05-21 DIAGNOSIS — Z3480 Encounter for supervision of other normal pregnancy, unspecified trimester: Secondary | ICD-10-CM | POA: Insufficient documentation

## 2021-05-21 DIAGNOSIS — Z3A00 Weeks of gestation of pregnancy not specified: Secondary | ICD-10-CM

## 2021-05-21 MED ORDER — BLOOD PRESSURE MONITORING DEVI: 1.0000 | 0 refills | Status: DC

## 2021-05-21 NOTE — Patient Instructions (Signed)
  At our [facility], we work as an integrated team, providing care to address both physical and emotional health. Your medical provider may refer you to see our Behavioral Health Clinician (BHC) on the same day you see your medical provider, as availability permits; often scheduled virtually at your convenience.  Our BHC is available to all patients, visits generally last between 20-30 minutes, but can be longer or shorter, depending on patient need. The BHC offers help with stress management, coping with symptoms of depression and anxiety, major life changes , sleep issues, changing risky behavior, grief and loss, life stress, working on personal life goals, and  behavioral health issues, as these all affect your overall health and wellness.  The BHC is NOT available for the following: FMLA paperwork, court-ordered evaluations, specialty assessments (custody or disability), letters to employers, or obtaining certification for an emotional support animal. The BHC does not provide long-term therapy. You have the right to refuse integrated behavioral health services, or to reschedule to see the BHC at a later date.  Confidentiality exception: If it is suspected that a child or disabled adult is being abused or neglected, we are required by law to report that to either Child Protective Services or Adult Protective Services.  If you have a diagnosis of Bipolar affective disorder, Schizophrenia, or recurrent Major depressive disorder, we will recommend that you establish care with a psychiatrist, as these are lifelong, chronic conditions, and we want your overall emotional health and medications to be more closely monitored. If you anticipate needing extended maternity leave due to mental health issues postpartum, it it recommended you inform your medical provider, so we can put in a referral to a psychiatrist as soon as possible. The BHC is unable to recommend an extended maternity leave for mental  health issues. Your medical provider or BHC may refer you to a therapist for ongoing, traditional therapy, or to a psychiatrist, for medication management, if it would benefit your overall health. Depending on your insurance, you may have a copay or be charged a deductible, depending on your insurance, to see the BHC. If you are uninsured, it is recommended that you apply for financial assistance. (Forms may be requested at the front desk for in-person visits, via MyChart, or request a form during a virtual visit).  If you see the BHC more than 6 times, you will have to complete a comprehensive clinical assessment interview with the BHC to resume integrated services.  For virtual visits with the BHC, you must be physically in the state of [place] at the time of the visit. For example, if you live in [place], you will have to do an in-person visit with the BHC, and your out-of-state insurance may not cover behavioral health services in [place]. If you are going out of the state or country for any reason, the BHC may see you virtually when you return to [place], but not while you are physically outside of [place].    
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
## 2021-05-24 ENCOUNTER — Inpatient Hospital Stay (HOSPITAL_COMMUNITY)
Admission: AD | Admit: 2021-05-24 | Discharge: 2021-05-25 | Disposition: A | Discharge status: Home / Self Care | Payer: Medicaid Other | Attending: Obstetrics & Gynecology | Admitting: Obstetrics & Gynecology

## 2021-05-24 ENCOUNTER — Other Ambulatory Visit: Payer: Self-pay

## 2021-05-24 DIAGNOSIS — Z3480 Encounter for supervision of other normal pregnancy, unspecified trimester: Secondary | ICD-10-CM

## 2021-05-24 DIAGNOSIS — O34219 Maternal care for unspecified type scar from previous cesarean delivery: Secondary | ICD-10-CM | POA: Insufficient documentation

## 2021-05-24 DIAGNOSIS — O3680X0 Pregnancy with inconclusive fetal viability, not applicable or unspecified: Secondary | ICD-10-CM

## 2021-05-24 DIAGNOSIS — O26891 Other specified pregnancy related conditions, first trimester: Secondary | ICD-10-CM | POA: Insufficient documentation

## 2021-05-24 DIAGNOSIS — O219 Vomiting of pregnancy, unspecified: Secondary | ICD-10-CM | POA: Insufficient documentation

## 2021-05-24 DIAGNOSIS — Z3491 Encounter for supervision of normal pregnancy, unspecified, first trimester: Secondary | ICD-10-CM

## 2021-05-24 DIAGNOSIS — R1030 Lower abdominal pain, unspecified: Secondary | ICD-10-CM | POA: Insufficient documentation

## 2021-05-24 DIAGNOSIS — Z3A08 8 weeks gestation of pregnancy: Secondary | ICD-10-CM | POA: Insufficient documentation

## 2021-05-24 DIAGNOSIS — Z79899 Other long term (current) drug therapy: Secondary | ICD-10-CM | POA: Insufficient documentation

## 2021-05-24 NOTE — MAU Note (Signed)
PT SAYS VOMITING SINCE Monday  [facility] WITH CLINIC NOT CALLED  NO MEDS

## 2021-05-25 ENCOUNTER — Inpatient Hospital Stay (HOSPITAL_COMMUNITY): Payer: Medicaid Other

## 2021-05-25 DIAGNOSIS — O219 Vomiting of pregnancy, unspecified: Secondary | ICD-10-CM

## 2021-05-25 DIAGNOSIS — Z3A08 8 weeks gestation of pregnancy: Secondary | ICD-10-CM

## 2021-05-25 DIAGNOSIS — R1030 Lower abdominal pain, unspecified: Secondary | ICD-10-CM | POA: Diagnosis not present

## 2021-05-25 DIAGNOSIS — O3680X0 Pregnancy with inconclusive fetal viability, not applicable or unspecified: Secondary | ICD-10-CM | POA: Diagnosis not present

## 2021-05-25 DIAGNOSIS — O34219 Maternal care for unspecified type scar from previous cesarean delivery: Secondary | ICD-10-CM | POA: Diagnosis not present

## 2021-05-25 DIAGNOSIS — O26891 Other specified pregnancy related conditions, first trimester: Secondary | ICD-10-CM | POA: Diagnosis not present

## 2021-05-25 DIAGNOSIS — Z79899 Other long term (current) drug therapy: Secondary | ICD-10-CM | POA: Diagnosis not present

## 2021-05-25 LAB — URINALYSIS, ROUTINE W REFLEX MICROSCOPIC
Bilirubin Urine: NEGATIVE
Glucose, UA: NEGATIVE mg/dL
Hgb urine dipstick: NEGATIVE
Ketones, ur: NEGATIVE mg/dL
Leukocytes,Ua: NEGATIVE
Nitrite: NEGATIVE
Protein, ur: NEGATIVE mg/dL
Specific Gravity, Urine: 1.028 (ref 1.005–1.030)
pH: 6 (ref 5.0–8.0)

## 2021-05-25 MED ORDER — PROMETHAZINE HCL 25 MG PO TABS: 25.0000 mg | ORAL_TABLET | Freq: Four times a day (QID) | ORAL | 0 refills | Status: DC | PRN

## 2021-05-25 MED ORDER — PROMETHAZINE HCL 25 MG/ML IJ SOLN
25.0000 mg | Freq: Once | INTRAMUSCULAR | Status: AC
  Administered 2021-05-25: 25 mg via INTRAMUSCULAR
  Filled 2021-05-25: qty 1

## 2021-05-25 NOTE — Discharge Instructions (Signed)
[facility]   [facility]             [address], [place], [place] [address] [phone]  [facility]                                                             [address], [address], [place], [place], [address] [phone]  [facility]                                    [address], [address], [place], [place], [address] [phone]  [facility] [place] [address], [address], [place], [place], [address] [phone]  [facility]                                 [address], [place], [place], [address] [phone]  [facility]                                    [address], [place], [place], [address] [phone]  [facility] [address], [address], [place], [place], [address]                              [facility] [address], [address], [place], [place], [address] [phone]  [facility]         Phone: [phone]  [facility]      Phone: [phone]   [facility]      Phone: [phone]  [facility]         Phone: [phone]   [facility]    Phone: [phone]  [facility]      Phone: [phone]  [facility]     Phone: [phone]  [facility]        Phone: [phone]  [facility]      Phone: [phone]                   Safe Medications in Pregnancy    Acne: Benzoyl Peroxide Salicylic Acid  Backache/Headache: Tylenol: 2 regular strength every 4 hours OR              2 Extra strength every 6  hours  Colds/Coughs/Allergies: Benadryl (alcohol free) 25 mg every 6 hours as needed Breath right strips Claritin Cepacol throat lozenges Chloraseptic throat spray Cold-Eeze- up to three times per day Cough drops, alcohol free Flonase (by prescription only) Guaifenesin Mucinex Robitussin DM (plain only, alcohol free) Saline nasal spray/drops Sudafed (pseudoephedrine) & Actifed ** use only after [redacted] weeks gestation and if you do not have high blood pressure Tylenol Vicks Vaporub Zinc lozenges Zyrtec   Constipation: Colace Ducolax suppositories Fleet enema Glycerin   suppositories Metamucil Milk of magnesia Miralax Senokot Smooth move tea  Diarrhea: Kaopectate Imodium A-D  *NO pepto Bismol  Hemorrhoids: Anusol Anusol HC Preparation H Tucks  Indigestion: Tums Maalox Mylanta Zantac  Pepcid  Insomnia: Benadryl (alcohol free) 25mg every 6 hours as needed Tylenol PM Unisom, no Gelcaps  Leg Cramps: Tums MagGel  Nausea/Vomiting:  Bonine Dramamine Emetrol Ginger extract Sea bands Meclizine  Nausea medication to take during pregnancy:  Unisom (doxylamine succinate 25 mg tablets) Take one tablet daily at bedtime. If symptoms are not adequately controlled, the dose can be increased to a maximum recommended dose of two tablets daily (1/2 tablet in the morning, 1/2 tablet mid-afternoon and one at bedtime). Vitamin B6 100mg tablets. Take one tablet twice a day (up to 200 mg per day).  Skin Rashes: Aveeno products Benadryl cream or 25mg every 6 hours as needed Calamine Lotion 1% cortisone cream  Yeast infection: Gyne-lotrimin 7 Monistat 7   **If taking multiple medications, please check labels to avoid duplicating the same active ingredients **take medication as directed on the label ** Do not exceed 4000 mg of tylenol in 24 hours **Do not take medications that contain aspirin or ibuprofen    

## 2021-05-25 NOTE — MAU Provider Note (Signed)
Chief Complaint:  Emesis    HPI: Julia Armstrong is a 30 y.o. [phone] at [redacted]w[redacted]d who presents to maternity admissions reporting nausea, vomiting, and lower abdominal pain. This has been ongoing since Monday. Feels like she is unable to keep anything down. Has not tried anything to relieve nausea or abdominal pain. Denies vaginal bleeding, discharge, or urinary s/s.   Pregnancy Course:   Past Medical History:  Diagnosis Date   Asthma    Gallstones    Wound infection after surgery 2016   c-section incision had to be reopened and wound vac placed   OB History  Gravida Para Term Preterm AB Living  8 4 4   3 4   SAB IAB Ectopic Multiple Live Births  3     0 4    # Outcome Date GA Lbr Len/2nd Weight Sex Delivery Anes PTL Lv  8 Current           7 SAB 03/2020          6 Term 05/10/18 [redacted]w[redacted]d  3650 g F CS-LTranv Other  LIV     Birth Comments: cesection  5 SAB 01/25/17          4 Term 02/21/15    M CS-LTranv   LIV  3 Term 06/27/13    M CS-LTranv   LIV  2 Term 07/09/12    M CS-LTranv   LIV  1 SAB            Past Surgical History:  Procedure Laterality Date   CESAREAN SECTION     C/S x 3   CESAREAN SECTION N/A 05/10/2018   Procedure: REPEAT CESAREAN SECTION;  Surgeon: 05/12/2018, MD;  [facility];  Service: Obstetrics;  Laterality: N/A;   DILATION AND EVACUATION N/A 02/18/2017   Procedure: DILATATION AND EVACUATION;  Surgeon: 02/20/2017, MD;  Location: [facility];  Service: Gynecology;  Laterality: N/A;   DILATION AND EVACUATION N/A 05/09/2020   Procedure: DILATATION AND EVACUATION;  Surgeon: 05/11/2020, MD;  Location: [facility];  Service: Gynecology;  Laterality: N/A;   WISDOM TOOTH EXTRACTION     Family History  Problem Relation Age of Onset   Cancer Other    Cancer Maternal Grandmother        breast cancer   Healthy Mother    Healthy Father    Social History   Tobacco Use   Smoking status: Never   Smokeless tobacco: Never   Vaping Use   Vaping Use: Never used  Substance Use Topics   Alcohol use: Not Currently   Drug use: No   No Known Allergies No medications prior to admission.    I have reviewed patient's Past Medical Hx, Surgical Hx, Family Hx, Social Hx, medications and allergies.   ROS:  Review of Systems  Constitutional: Negative.   Respiratory: Negative.    Cardiovascular: Negative.   Gastrointestinal:  Positive for abdominal pain, nausea and vomiting.  Genitourinary: Negative.   Musculoskeletal: Negative.   Neurological: Negative.   Psychiatric/Behavioral: Negative.     Physical Exam  Patient Vitals for the past 24 hrs:  BP Temp Temp src Pulse Resp Height Weight  05/25/21 0520 -- -- -- -- 20 -- --  05/24/21 2248 122/73 99 F (37.2 C) Oral 91 20 5\' 7"  (1.702 m) 102.8 kg   Constitutional: well-developed, well-nourished female in no acute distress.  Cardiovascular: normal rate Respiratory: normal effort GI: abd soft, non-tender MS: extremities nontender, no  edema, normal ROM Neurologic: alert and oriented x 4.  Pelvic: deferred      Labs: Results for orders placed or performed during the hospital encounter of 05/24/21 (from the past 24 hour(s))  Urinalysis, Routine w reflex microscopic     Status: Abnormal   Collection Time: 05/25/21  3:22 AM  Result Value Ref Range   Color, Urine YELLOW YELLOW   APPearance HAZY (A) CLEAR   Specific Gravity, Urine 1.028 1.005 - 1.030   pH 6.0 5.0 - 8.0   Glucose, UA NEGATIVE NEGATIVE mg/dL   Hgb urine dipstick NEGATIVE NEGATIVE   Bilirubin Urine NEGATIVE NEGATIVE   Ketones, ur NEGATIVE NEGATIVE mg/dL   Protein, ur NEGATIVE NEGATIVE mg/dL   Nitrite NEGATIVE NEGATIVE   Leukocytes,Ua NEGATIVE NEGATIVE    Imaging:  US OB Comp Less 14 Wks  Result Date: 05/25/2021 CLINICAL DATA:  Initial evaluation for early pregnancy, viability. EXAM: OBSTETRIC <14 WK [place] AND TRANSVAGINAL OB US TECHNIQUE: Both transabdominal and transvaginal ultrasound  examinations were performed for complete evaluation of the gestation as well as the maternal uterus, adnexal regions, and pelvic cul-de-sac. Transvaginal technique was performed to assess early pregnancy. COMPARISON:  Prior ultrasound from 05/01/2021. FINDINGS: Intrauterine gestational sac: Single Yolk sac:  Present Embryo:  Present Cardiac Activity: Present Heart Rate: 124 bpm CRL:  19.5 mm   8 w   3 d                  [place] EDC: 01/01/2022 Subchorionic hemorrhage:  None visualized. Maternal uterus/adnexae: Right ovary within normal limits. Left ovary not seen. No adnexal mass or free fluid. IMPRESSION: 1. Single viable intrauterine pregnancy, estimated gestational age [redacted] weeks and 3 days by crown-rump length, with ultrasound EDC of 01/01/2022. No complication. 2. No other acute maternal uterine or adnexal abnormality. Electronically Signed   By: Rise Mu M.D.   On: 05/25/2021 05:11   US OB Transvaginal  Result Date: 05/25/2021 CLINICAL DATA:  Initial evaluation for early pregnancy, viability. EXAM: OBSTETRIC <14 WK [place] AND TRANSVAGINAL OB US TECHNIQUE: Both transabdominal and transvaginal ultrasound examinations were performed for complete evaluation of the gestation as well as the maternal uterus, adnexal regions, and pelvic cul-de-sac. Transvaginal technique was performed to assess early pregnancy. COMPARISON:  Prior ultrasound from 05/01/2021. FINDINGS: Intrauterine gestational sac: Single Yolk sac:  Present Embryo:  Present Cardiac Activity: Present Heart Rate: 124 bpm CRL:  19.5 mm   8 w   3 d                  [place] EDC: 01/01/2022 Subchorionic hemorrhage:  None visualized. Maternal uterus/adnexae: Right ovary within normal limits. Left ovary not seen. No adnexal mass or free fluid. IMPRESSION: 1. Single viable intrauterine pregnancy, estimated gestational age [redacted] weeks and 3 days by crown-rump length, with ultrasound EDC of 01/01/2022. No complication. 2. No other acute maternal uterine or adnexal  abnormality. Electronically Signed   By: Rise Mu M.D.   On: 05/25/2021 05:11     MAU Course: Orders Placed This Encounter  Procedures   US OB Comp Less 14 Wks   US OB Transvaginal   Urinalysis, Routine w reflex microscopic   Discharge patient   Meds ordered this encounter  Medications   promethazine (PHENERGAN) injection 25 mg   promethazine (PHENERGAN) 25 MG tablet    Sig: Take 1 tablet (25 mg total) by mouth every 6 (six) hours as needed for nausea or vomiting.    Dispense:  30  tablet    Refill:  0    Order Specific Question:   Supervising Provider    Answer:   Samara Snide    MDM: UA unremarkable Phenergan IM [place] with IUP at [redacted]w[redacted]d with FHR present   Assessment: 1. Supervision of other normal pregnancy, antepartum   2. Pregnancy with uncertain fetal viability   3. [redacted] weeks gestation of pregnancy   4. Normal IUP (intrauterine pregnancy) on prenatal ultrasound, first trimester   5. Nausea and vomiting during pregnancy     Plan: Discharge home in stable condition Strict return precautions reviewed Return to MAU as needed [facility] appointment at [facility] as scheduled     Allergies as of 05/25/2021   No Known Allergies      Medication List     TAKE these medications    albuterol 108 (90 Base) MCG/ACT inhaler Commonly known as: VENTOLIN HFA Inhale 2 puffs into the lungs every 6 (six) hours as needed for wheezing or shortness of breath.   Blood Pressure Monitoring Devi 1 each by Does not apply route once a week.   cyclobenzaprine 10 MG tablet Commonly known as: FLEXERIL Take 1 tablet (10 mg total) by mouth 2 (two) times daily as needed for muscle spasms.   Prenatal Vitamin 27-0.8 MG Tabs Take 1 tablet by mouth daily.   promethazine 25 MG tablet Commonly known as: PHENERGAN Take 1 tablet (25 mg total) by mouth every 6 (six) hours as needed for nausea or vomiting.        Camelia Eng, CNM 05/25/2021 6:37 AM

## 2021-06-03 ENCOUNTER — Other Ambulatory Visit (HOSPITAL_COMMUNITY)
Admission: RE | Admit: 2021-06-03 | Discharge: 2021-06-03 | Disposition: A | Discharge status: Home / Self Care | Payer: Medicaid Other | Source: Ambulatory Visit | Attending: Nurse Practitioner | Admitting: Nurse Practitioner

## 2021-06-03 ENCOUNTER — Encounter: Payer: Self-pay | Admitting: Nurse Practitioner

## 2021-06-03 ENCOUNTER — Other Ambulatory Visit: Payer: Self-pay

## 2021-06-03 ENCOUNTER — Ambulatory Visit (INDEPENDENT_AMBULATORY_CARE_PROVIDER_SITE_OTHER): Payer: Medicaid Other | Admitting: Nurse Practitioner

## 2021-06-03 VITALS — BP 111/81 | HR 78 | Wt 226.4 lb

## 2021-06-03 DIAGNOSIS — N898 Other specified noninflammatory disorders of vagina: Secondary | ICD-10-CM

## 2021-06-03 DIAGNOSIS — Z3480 Encounter for supervision of other normal pregnancy, unspecified trimester: Secondary | ICD-10-CM | POA: Insufficient documentation

## 2021-06-03 DIAGNOSIS — R102 Pelvic and perineal pain: Secondary | ICD-10-CM

## 2021-06-03 DIAGNOSIS — O34219 Maternal care for unspecified type scar from previous cesarean delivery: Secondary | ICD-10-CM | POA: Diagnosis not present

## 2021-06-03 DIAGNOSIS — Z3A09 9 weeks gestation of pregnancy: Secondary | ICD-10-CM

## 2021-06-03 DIAGNOSIS — O210 Mild hyperemesis gravidarum: Secondary | ICD-10-CM

## 2021-06-03 DIAGNOSIS — O26891 Other specified pregnancy related conditions, first trimester: Secondary | ICD-10-CM | POA: Diagnosis not present

## 2021-06-03 MED ORDER — DOXYLAMINE-PYRIDOXINE 10-10 MG PO TBEC: DELAYED_RELEASE_TABLET | ORAL | 2 refills | Status: DC
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
## 2021-06-03 NOTE — Progress Notes (Signed)
Informal bedside US performed for FHR confirmation.  Single IUP identified with cardiac activity present. FHR = 164 bpm per M-mode. Fetal movement observed during [place].

## 2021-06-03 NOTE — Patient Instructions (Signed)
[facility] [address], [place], [place] [address] [phone] Hours: Sunday Closed Monday 9AM-6PM Tuesday 9AM-6PM Wednesday 9AM-6PM Thursday 9AM-6PM Friday           9AM-6PM Saturday         10AM-1PM  

## 2021-06-04 ENCOUNTER — Telehealth: Payer: Self-pay | Admitting: Obstetrics and Gynecology

## 2021-06-04 ENCOUNTER — Telehealth (INDEPENDENT_AMBULATORY_CARE_PROVIDER_SITE_OTHER): Payer: Medicaid Other | Admitting: Obstetrics and Gynecology

## 2021-06-04 DIAGNOSIS — R030 Elevated blood-pressure reading, without diagnosis of hypertension: Secondary | ICD-10-CM | POA: Diagnosis not present

## 2021-06-04 LAB — CBC/D/PLT+RPR+RH+ABO+RUBIGG...
Antibody Screen: NEGATIVE
Basophils Absolute: 0 10*3/uL (ref 0.0–0.2)
Basos: 1 %
EOS (ABSOLUTE): 0 10*3/uL (ref 0.0–0.4)
Eos: 1 %
HCV Ab: <0.1 s/co ratio (ref 0.0–0.9)
HIV Screen 4th Generation wRfx: NONREACTIVE
Hematocrit: 37.8 % (ref 34.0–46.6)
Hemoglobin: 12.5 g/dL (ref 11.1–15.9)
Hepatitis B Surface Ag: NEGATIVE
Immature Grans (Abs): 0 10*3/uL (ref 0.0–0.1)
Immature Granulocytes: 0 %
Lymphocytes Absolute: 1.8 10*3/uL (ref 0.7–3.1)
Lymphs: 31 %
MCH: 24.6 pg — ABNORMAL LOW (ref 26.6–33.0)
MCHC: 33.1 g/dL (ref 31.5–35.7)
MCV: 74 fL — ABNORMAL LOW (ref 79–97)
Monocytes Absolute: 0.4 10*3/uL (ref 0.1–0.9)
Monocytes: 7 %
Neutrophils Absolute: 3.6 10*3/uL (ref 1.4–7.0)
Neutrophils: 60 %
Platelets: 285 10*3/uL (ref 150–450)
RBC: 5.09 x10E6/uL (ref 3.77–5.28)
RDW: 15.5 % — ABNORMAL HIGH (ref 11.7–15.4)
RPR Ser Ql: NONREACTIVE
Rh Factor: POSITIVE
Rubella Antibodies, IGG: 4.22 index (ref >0.99)
WBC: 5.9 10*3/uL (ref 3.4–10.8)

## 2021-06-04 LAB — HCV INTERPRETATION

## 2021-06-04 LAB — CERVICOVAGINAL ANCILLARY ONLY
Bacterial Vaginitis (gardnerella): NEGATIVE
Candida Glabrata: NEGATIVE
Candida Vaginitis: NEGATIVE
Comment: NEGATIVE
Comment: NEGATIVE
Comment: NEGATIVE

## 2021-06-04 LAB — HEMOGLOBIN A1C
Est. average glucose Bld gHb Est-mCnc: 120 mg/dL
Hgb A1c MFr Bld: 5.8 % — ABNORMAL HIGH (ref 4.8–5.6)

## 2021-06-04 NOTE — Telephone Encounter (Signed)
TELEHEALTH GYNECOLOGY VISIT ENCOUNTER NOTE  Provider location: [facility]    Patient location: Home  I connected with Julia Armstrong on 06/04/21 at 2009 by telephone and verified that I am speaking with the correct person using two identifiers. Patient was unable to do MyChart audiovisual encounter due to technical difficulties, she tried several times.    I discussed the limitations, risks, security and privacy concerns of performing an evaluation and management service by telephone and the availability of in person appointments. I also discussed with the patient that there may be a patient responsible charge related to this service. The patient expressed understanding and agreed to proceed.  History:  Julia Armstrong is a 30 y.o. [phone] female at [redacted]w[redacted]d being evaluated today for elevated BP 145/86. She denies any abnormal vaginal discharge, bleeding, pelvic pain or other concerns. She reports a headache since earlier this afternoon    Past Medical History:  Diagnosis Date   Asthma    Gallstones    Missed abortion 05/09/2020   Wound infection after surgery 2016   c-section incision had to be reopened and wound vac placed   Past Surgical History:  Procedure Laterality Date   CESAREAN SECTION     C/S x 3   CESAREAN SECTION N/A 05/10/2018   Procedure: REPEAT CESAREAN SECTION;  Surgeon: Lyndon Bing, MD;  Location: [facility] BIRTHING SUITES;  Service: Obstetrics;  Laterality: N/A;   DILATION AND EVACUATION N/A 02/18/2017   Procedure: DILATATION AND EVACUATION;  Surgeon: Tereso Newcomer, MD;  Location: [facility];  Service: Gynecology;  Laterality: N/A;   DILATION AND EVACUATION N/A 05/09/2020   Procedure: DILATATION AND EVACUATION;  Surgeon: Adam Phenix, MD;  Location: [facility];  Service: Gynecology;  Laterality: N/A;   WISDOM TOOTH EXTRACTION     The following portions of the patient's history were reviewed and updated as appropriate:  allergies, current medications, past family history, past medical history, past social history, past surgical history and problem list.     Review of Systems:  Pertinent items noted in HPI and remainder of comprehensive ROS otherwise negative.  Physical Exam:   General:  Alert, oriented and cooperative.   Mental Status: Normal mood and affect perceived. Normal judgment and thought content.  Physical exam deferred due to nature of the encounter  Labs and Imaging Results for orders placed or performed in visit on 06/03/21 (from the past 336 hour(s))  Cervicovaginal ancillary only   Collection Time: 06/03/21  4:27 PM  Result Value Ref Range   Bacterial Vaginitis (gardnerella) Negative    Candida Vaginitis Negative    Candida Glabrata Negative    Comment      Normal Reference Range Bacterial Vaginosis - Negative   Comment Normal Reference Range Candida Species - Negative    Comment Normal Reference Range Candida Galbrata - Negative   CBC/D/Plt+RPR+Rh+ABO+RubIgG...   Collection Time: 06/03/21  4:37 PM  Result Value Ref Range   Hepatitis B Surface Ag Negative Negative   HCV Ab <0.1 0.0 - 0.9 s/co ratio   RPR Ser Ql Non Reactive Non Reactive   Rubella Antibodies, IGG 4.22 Immune >0.99 index   ABO Grouping B    Rh Factor Positive    Antibody Screen Negative Negative   HIV Screen 4th Generation wRfx Non Reactive Non Reactive   WBC 5.9 3.4 - 10.8 x10E3/uL   RBC 5.09 3.77 - 5.28 x10E6/uL   Hemoglobin 12.5 11.1 - 15.9 g/dL  Hematocrit 37.8 34.0 - 46.6 %   MCV 74 (L) 79 - 97 fL   MCH 24.6 (L) 26.6 - 33.0 pg   MCHC 33.1 31.5 - 35.7 g/dL   RDW 15.5 (H) 11.7 - 15.4 %   Platelets 285 150 - 450 x10E3/uL   Neutrophils 60 Not Estab. %   Lymphs 31 Not Estab. %   Monocytes 7 Not Estab. %   Eos 1 Not Estab. %   Basos 1 Not Estab. %   Neutrophils Absolute 3.6 1.4 - 7.0 x10E3/uL   Lymphocytes Absolute 1.8 0.7 - 3.1 x10E3/uL   Monocytes Absolute 0.4 0.1 - 0.9 x10E3/uL   EOS (ABSOLUTE) 0.0  0.0 - 0.4 x10E3/uL   Basophils Absolute 0.0 0.0 - 0.2 x10E3/uL   Immature Granulocytes 0 Not Estab. %   Immature Grans (Abs) 0.0 0.0 - 0.1 x10E3/uL  HgB A1c   Collection Time: 06/03/21  4:37 PM  Result Value Ref Range   Hgb A1c MFr Bld 5.8 (H) 4.8 - 5.6 %   Est. average glucose Bld gHb Est-mCnc 120 mg/dL  Interpretation:   Collection Time: 06/03/21  4:37 PM  Result Value Ref Range   HCV Interp 1: Comment   Results for orders placed or performed during the hospital encounter of 05/24/21 (from the past 336 hour(s))  Urinalysis, Routine w reflex microscopic   Collection Time: 05/25/21  3:22 AM  Result Value Ref Range   Color, Urine YELLOW YELLOW   APPearance HAZY (A) CLEAR   Specific Gravity, Urine 1.028 1.005 - 1.030   pH 6.0 5.0 - 8.0   Glucose, UA NEGATIVE NEGATIVE mg/dL   Hgb urine dipstick NEGATIVE NEGATIVE   Bilirubin Urine NEGATIVE NEGATIVE   Ketones, ur NEGATIVE NEGATIVE mg/dL   Protein, ur NEGATIVE NEGATIVE mg/dL   Nitrite NEGATIVE NEGATIVE   Leukocytes,Ua NEGATIVE NEGATIVE   US OB Comp Less 14 Wks  Result Date: 05/25/2021 CLINICAL DATA:  Initial evaluation for early pregnancy, viability. EXAM: OBSTETRIC <14 WK [place] AND TRANSVAGINAL OB US TECHNIQUE: Both transabdominal and transvaginal ultrasound examinations were performed for complete evaluation of the gestation as well as the maternal uterus, adnexal regions, and pelvic cul-de-sac. Transvaginal technique was performed to assess early pregnancy. COMPARISON:  Prior ultrasound from 05/01/2021. FINDINGS: Intrauterine gestational sac: Single Yolk sac:  Present Embryo:  Present Cardiac Activity: Present Heart Rate: 124 bpm CRL:  19.5 mm   8 w   3 d                  [place] EDC: 01/01/2022 Subchorionic hemorrhage:  None visualized. Maternal uterus/adnexae: Right ovary within normal limits. Left ovary not seen. No adnexal mass or free fluid. IMPRESSION: 1. Single viable intrauterine pregnancy, estimated gestational age 106 weeks and 3 days by  crown-rump length, with ultrasound EDC of 01/01/2022. No complication. 2. No other acute maternal uterine or adnexal abnormality. Electronically Signed   By: Jeannine Boga M.D.   On: 05/25/2021 05:11   US OB Transvaginal  Result Date: 05/25/2021 CLINICAL DATA:  Initial evaluation for early pregnancy, viability. EXAM: OBSTETRIC <14 WK [place] AND TRANSVAGINAL OB US TECHNIQUE: Both transabdominal and transvaginal ultrasound examinations were performed for complete evaluation of the gestation as well as the maternal uterus, adnexal regions, and pelvic cul-de-sac. Transvaginal technique was performed to assess early pregnancy. COMPARISON:  Prior ultrasound from 05/01/2021. FINDINGS: Intrauterine gestational sac: Single Yolk sac:  Present Embryo:  Present Cardiac Activity: Present Heart Rate: 124 bpm CRL:  19.5 mm  8 w   3 d                  [place] EDC: 01/01/2022 Subchorionic hemorrhage:  None visualized. Maternal uterus/adnexae: Right ovary within normal limits. Left ovary not seen. No adnexal mass or free fluid. IMPRESSION: 1. Single viable intrauterine pregnancy, estimated gestational age [redacted] weeks and 3 days by crown-rump length, with ultrasound EDC of 01/01/2022. No complication. 2. No other acute maternal uterine or adnexal abnormality. Electronically Signed   By: Jeannine Boga M.D.   On: 05/25/2021 05:11   @OBJECTIVEEND @  Assessment and Plan:  Elevated BP without history of HTN Encouraged the use of tylenol and educated on proper hydration Will continue to monitor closely    I discussed the assessment and treatment plan with the patient. The patient was provided an opportunity to ask questions and all were answered. The patient agreed with the plan and demonstrated an understanding of the instructions.   The patient was advised to call back or seek an in-person evaluation/go to the ED if the symptoms worsen or if the condition fails to improve as anticipated.  I provided 10 minutes of  non-face-to-face time during this encounter.   Mora Bellman, MD [facility]

## 2021-06-04 NOTE — Telephone Encounter (Signed)
Error. See previous documentation.

## 2021-06-05 LAB — CYTOLOGY - PAP: Diagnosis: NEGATIVE

## 2021-06-12 ENCOUNTER — Encounter: Payer: Self-pay | Admitting: Nurse Practitioner

## 2021-06-12 DIAGNOSIS — R102 Pelvic and perineal pain: Secondary | ICD-10-CM | POA: Insufficient documentation

## 2021-06-12 DIAGNOSIS — O26891 Other specified pregnancy related conditions, first trimester: Secondary | ICD-10-CM | POA: Insufficient documentation

## 2021-06-17 ENCOUNTER — Other Ambulatory Visit: Payer: Self-pay | Admitting: Obstetrics and Gynecology

## 2021-06-17 NOTE — Progress Notes (Signed)
Received elevated BP alert from [facility]. Spoke to pt. HA all day despite Tylenol. BP 150's/100's Pt advised to go to MAU for further evaluation

## 2021-06-18 ENCOUNTER — Encounter (HOSPITAL_COMMUNITY): Payer: Self-pay | Admitting: Obstetrics and Gynecology

## 2021-06-18 ENCOUNTER — Other Ambulatory Visit: Payer: Self-pay

## 2021-06-18 ENCOUNTER — Inpatient Hospital Stay (HOSPITAL_COMMUNITY)
Admission: AD | Admit: 2021-06-18 | Discharge: 2021-06-18 | Disposition: A | Discharge status: Home / Self Care | Payer: Medicaid Other | Attending: Obstetrics and Gynecology | Admitting: Obstetrics and Gynecology

## 2021-06-18 DIAGNOSIS — Z3A11 11 weeks gestation of pregnancy: Secondary | ICD-10-CM | POA: Insufficient documentation

## 2021-06-18 DIAGNOSIS — R519 Headache, unspecified: Secondary | ICD-10-CM | POA: Diagnosis present

## 2021-06-18 DIAGNOSIS — Z3491 Encounter for supervision of normal pregnancy, unspecified, first trimester: Secondary | ICD-10-CM

## 2021-06-18 DIAGNOSIS — R030 Elevated blood-pressure reading, without diagnosis of hypertension: Secondary | ICD-10-CM

## 2021-06-18 DIAGNOSIS — R109 Unspecified abdominal pain: Secondary | ICD-10-CM | POA: Insufficient documentation

## 2021-06-18 DIAGNOSIS — O99891 Other specified diseases and conditions complicating pregnancy: Secondary | ICD-10-CM

## 2021-06-18 DIAGNOSIS — Z3480 Encounter for supervision of other normal pregnancy, unspecified trimester: Secondary | ICD-10-CM

## 2021-06-18 DIAGNOSIS — O26891 Other specified pregnancy related conditions, first trimester: Secondary | ICD-10-CM | POA: Insufficient documentation

## 2021-06-18 LAB — COMPREHENSIVE METABOLIC PANEL
ALT: 14 U/L (ref 0–44)
AST: 16 U/L (ref 15–41)
Albumin: 2.7 g/dL — ABNORMAL LOW (ref 3.5–5.0)
Alkaline Phosphatase: 47 U/L (ref 38–126)
Anion gap: 9 (ref 5–15)
BUN: 5 mg/dL — ABNORMAL LOW (ref 6–20)
CO2: 24 mmol/L (ref 22–32)
Calcium: 9.4 mg/dL (ref 8.9–10.3)
Chloride: 103 mmol/L (ref 98–111)
Creatinine, Ser: 0.7 mg/dL (ref 0.44–1.00)
GFR, Estimated: >60 mL/min (ref >60)
Glucose, Bld: 117 mg/dL — ABNORMAL HIGH (ref 70–99)
Potassium: 3.5 mmol/L (ref 3.5–5.1)
Sodium: 136 mmol/L (ref 135–145)
Total Bilirubin: 0.4 mg/dL (ref 0.3–1.2)
Total Protein: 6.5 g/dL (ref 6.5–8.1)

## 2021-06-18 LAB — CBC
HCT: 37.2 % (ref 36.0–46.0)
Hemoglobin: 12 g/dL (ref 12.0–15.0)
MCH: 24.6 pg — ABNORMAL LOW (ref 26.0–34.0)
MCHC: 32.3 g/dL (ref 30.0–36.0)
MCV: 76.2 fL — ABNORMAL LOW (ref 80.0–100.0)
Platelets: 296 10*3/uL (ref 150–400)
RBC: 4.88 MIL/uL (ref 3.87–5.11)
RDW: 15.1 % (ref 11.5–15.5)
WBC: 8.4 10*3/uL (ref 4.0–10.5)
nRBC: 0 % (ref 0.0–0.2)

## 2021-06-18 LAB — URINALYSIS, ROUTINE W REFLEX MICROSCOPIC
Bilirubin Urine: NEGATIVE
Glucose, UA: NEGATIVE mg/dL
Hgb urine dipstick: NEGATIVE
Ketones, ur: 5 mg/dL — AB
Leukocytes,Ua: NEGATIVE
Nitrite: NEGATIVE
Protein, ur: NEGATIVE mg/dL
Specific Gravity, Urine: 1.03 (ref 1.005–1.030)
pH: 5 (ref 5.0–8.0)

## 2021-06-18 LAB — PROTEIN / CREATININE RATIO, URINE
Creatinine, Urine: 296.07 mg/dL
Total Protein, Urine: <6 mg/dL

## 2021-06-18 MED ORDER — DEXAMETHASONE SODIUM PHOSPHATE 10 MG/ML IJ SOLN
10.0000 mg | Freq: Once | INTRAMUSCULAR | Status: AC
  Administered 2021-06-18: 10 mg via INTRAVENOUS
  Filled 2021-06-18: qty 1

## 2021-06-18 MED ORDER — METOCLOPRAMIDE HCL 5 MG/ML IJ SOLN
10.0000 mg | Freq: Once | INTRAMUSCULAR | Status: AC
  Administered 2021-06-18: 10 mg via INTRAVENOUS
  Filled 2021-06-18: qty 2

## 2021-06-18 MED ORDER — LACTATED RINGERS IV BOLUS
1000.0000 mL | Freq: Once | INTRAVENOUS | Status: AC
  Administered 2021-06-18: 1000 mL via INTRAVENOUS

## 2021-06-18 MED ORDER — DIPHENHYDRAMINE HCL 50 MG/ML IJ SOLN
25.0000 mg | Freq: Once | INTRAMUSCULAR | Status: AC
  Administered 2021-06-18: 25 mg via INTRAVENOUS
  Filled 2021-06-18: qty 1

## 2021-06-18 NOTE — MAU Provider Note (Signed)
History     CSN: [id number]  Arrival date and time: 06/18/21 0006   Event Date/Time   First Provider Initiated Contact with Patient 06/18/21 0049      Chief Complaint  Patient presents with   Headache    Right side abdomen pain   HPI Julia Armstrong is a 30 y.o. [id number] at [redacted]w[redacted]d who presents to MAU with chief complaints of headache, generalized right-sided abdominal pain, and chest pain in the setting of elevated blood pressures on her home cuff. Patient was contacted by Dr. Alysia Penna via [facility] and advised to present to MAU.  On arrival to MAU patient's headache pain is anterior, bilateral, rated as 10/10. She has not taken medication or tried other treatments for this complaint.  Patient's abdominal pain is generalized to her right side. Pain score 8/10. She denies aggravating or alleviating factors. She denies abdominal tenderness, dysuria, flank pain, weakness, syncope.  Patient receives care with [facility].  OB History     Gravida  8   Para  4   Term  4   Preterm      AB  3   Living  4      SAB  3   IAB      Ectopic      Multiple  0   Live Births  4           Past Medical History:  Diagnosis Date   Asthma    Gallstones    Missed abortion 05/09/2020   Wound infection after surgery 2016   c-section incision had to be reopened and wound vac placed    Past Surgical History:  Procedure Laterality Date   CESAREAN SECTION     C/S x 3   CESAREAN SECTION N/A 05/10/2018   Procedure: REPEAT CESAREAN SECTION;  Surgeon: [place] Bing, MD;  Location: [facility] BIRTHING SUITES;  Service: Obstetrics;  Laterality: N/A;   DILATION AND EVACUATION N/A 02/18/2017   Procedure: DILATATION AND EVACUATION;  Surgeon: Tereso Newcomer, MD;  Location: [facility];  Service: Gynecology;  Laterality: N/A;   DILATION AND EVACUATION N/A 05/09/2020   Procedure: DILATATION AND EVACUATION;  Surgeon: Adam Phenix, MD;  Location: [facility];  Service: Gynecology;   Laterality: N/A;   WISDOM TOOTH EXTRACTION      Family History  Problem Relation Age of Onset   Cancer Other    Cancer Maternal Grandmother        breast cancer   Healthy Mother    Healthy Father     Social History   Tobacco Use   Smoking status: Never   Smokeless tobacco: Never  Vaping Use   Vaping Use: Never used  Substance Use Topics   Alcohol use: Not Currently   Drug use: No    Allergies: No Known Allergies  Medications Prior to Admission  Medication Sig Dispense Refill Last Dose   albuterol (VENTOLIN HFA) 108 (90 Base) MCG/ACT inhaler Inhale 2 puffs into the lungs every 6 (six) hours as needed for wheezing or shortness of breath. 18 g 0 Past Month   Blood Pressure Monitoring DEVI 1 each by Does not apply route once a week. 1 each 0 06/18/2021   Doxylamine-Pyridoxine (DICLEGIS) 10-10 MG TBEC Take 2 tablets at bedtime and one in the morning and one in the afternoon as needed for nausea. 60 tablet 2 06/17/2021   cyclobenzaprine (FLEXERIL) 10 MG tablet Take 1 tablet (10 mg total) by mouth 2 (two) times daily  as needed for muscle spasms. (Patient not taking: Reported on 05/21/2021) 20 tablet 0 Unknown   Prenatal Vit-Fe Fumarate-FA (PRENATAL VITAMIN) 27-0.8 MG TABS Take 1 tablet by mouth daily. 30 tablet 11 Unknown   promethazine (PHENERGAN) 25 MG tablet Take 1 tablet (25 mg total) by mouth every 6 (six) hours as needed for nausea or vomiting. 30 tablet 0 Unknown    Review of Systems  Cardiovascular:  Positive for chest pain.  Gastrointestinal:  Positive for abdominal pain.  Neurological:  Positive for headaches.  All other systems reviewed and are negative. Physical Exam   Blood pressure (!) 113/53, pulse 88, temperature 98.9 F (37.2 C), temperature source Oral, resp. rate 20, height 5\' 7"  (1.702 m), weight 103.2 kg, last menstrual period 03/01/2021.  Physical Exam Vitals and nursing note reviewed.  Constitutional:      Appearance: She is well-developed.   Cardiovascular:     Rate and Rhythm: Normal rate and regular rhythm.     Heart sounds: Normal heart sounds.  Pulmonary:     Effort: Pulmonary effort is normal.     Breath sounds: Normal breath sounds.  Abdominal:     General: Bowel sounds are normal.     Palpations: Abdomen is soft.  Skin:    General: Skin is warm and dry.     Capillary Refill: Capillary refill takes less than 2 seconds.  Neurological:     Mental Status: She is alert.  Psychiatric:        Mood and Affect: Mood normal.        Speech: Speech normal.        Behavior: Behavior normal.    MAU Course/MDM  Procedures  --Live IUP confirmed 05/25/2021 --Normtensive in MAU, elevated BPs on home cuff. Will collect baseline labs while assessing for HTN --Pain resolved with IV headache cocktail  Patient Vitals for the past 24 hrs:  BP Temp Temp src Pulse Resp Height Weight  06/18/21 0215 (!) 125/57 -- -- 82 -- -- --  06/18/21 0200 (!) 113/53 -- -- 88 -- -- --  06/18/21 0146 (!) 118/58 -- -- 79 -- -- --  06/18/21 0130 (!) 121/57 -- -- 85 -- -- --  06/18/21 0115 127/78 -- -- 82 -- -- --  06/18/21 0100 (!) 117/54 -- -- 89 -- -- --  06/18/21 0057 119/65 -- -- 84 -- -- --  06/18/21 0041 125/68 -- -- 96 -- -- --  06/18/21 0019 122/74 98.9 F (37.2 C) Oral (!) 107 20 5\' 7"  (1.702 m) 103.2 kg     Orders Placed This Encounter  Procedures   CBC   Comprehensive metabolic panel   Protein / creatinine ratio, urine   Urinalysis, Routine w reflex microscopic Urine, Clean Catch   ED EKG   Discharge patient   Meds ordered this encounter  Medications   metoCLOPramide (REGLAN) injection 10 mg   diphenhydrAMINE (BENADRYL) injection 25 mg   dexamethasone (DECADRON) injection 10 mg   lactated ringers bolus 1,000 mL   Results for orders placed or performed during the hospital encounter of 06/18/21 (from the past 24 hour(s))  Protein / creatinine ratio, urine     Status: None   Collection Time: 06/18/21 12:26 AM  Result  Value Ref Range   Creatinine, Urine 296.07 mg/dL   Total Protein, Urine <6 mg/dL   Protein Creatinine Ratio        0.00 - 0.15 mg/mg[Cre]  Urinalysis, Routine w reflex microscopic Urine, Clean Catch  Status: Abnormal   Collection Time: 06/18/21 12:29 AM  Result Value Ref Range   Color, Urine YELLOW YELLOW   APPearance HAZY (A) CLEAR   Specific Gravity, Urine 1.030 1.005 - 1.030   pH 5.0 5.0 - 8.0   Glucose, UA NEGATIVE NEGATIVE mg/dL   Hgb urine dipstick NEGATIVE NEGATIVE   Bilirubin Urine NEGATIVE NEGATIVE   Ketones, ur 5 (A) NEGATIVE mg/dL   Protein, ur NEGATIVE NEGATIVE mg/dL   Nitrite NEGATIVE NEGATIVE   Leukocytes,Ua NEGATIVE NEGATIVE  CBC     Status: Abnormal   Collection Time: 06/18/21 12:37 AM  Result Value Ref Range   WBC 8.4 4.0 - 10.5 K/uL   RBC 4.88 3.87 - 5.11 MIL/uL   Hemoglobin 12.0 12.0 - 15.0 g/dL   HCT 66.5 99.3 - 57.0 %   MCV 76.2 (L) 80.0 - 100.0 fL   MCH 24.6 (L) 26.0 - 34.0 pg   MCHC 32.3 30.0 - 36.0 g/dL   RDW 17.7 93.9 - 03.0 %   Platelets 296 150 - 400 K/uL   nRBC 0.0 0.0 - 0.2 %  Comprehensive metabolic panel     Status: Abnormal   Collection Time: 06/18/21 12:37 AM  Result Value Ref Range   Sodium 136 135 - 145 mmol/L   Potassium 3.5 3.5 - 5.1 mmol/L   Chloride 103 98 - 111 mmol/L   CO2 24 22 - 32 mmol/L   Glucose, Bld 117 (H) 70 - 99 mg/dL   BUN 5 (L) 6 - 20 mg/dL   Creatinine, Ser 0.92 0.44 - 1.00 mg/dL   Calcium 9.4 8.9 - 33.0 mg/dL   Total Protein 6.5 6.5 - 8.1 g/dL   Albumin 2.7 (L) 3.5 - 5.0 g/dL   AST 16 15 - 41 U/L   ALT 14 0 - 44 U/L   Alkaline Phosphatase 47 38 - 126 U/L   Total Bilirubin 0.4 0.3 - 1.2 mg/dL   GFR, Estimated >07 >62 mL/min   Anion gap 9 5 - 15   Assessment and Plan  --30 y.o. [id number] at [redacted]w[redacted]d  --Normotensive --Baseline PEC labs WNL --Patient's pain score 0/10 at discharge --Discharge home in stable condition  Calvert Cantor, CNM 06/18/2021, 7:43 AM

## 2021-06-18 NOTE — Discharge Instructions (Signed)

## 2021-06-18 NOTE — MAU Note (Addendum)
PT SAYS SHE CHECKS BP AND BABY SCRIPTS.  DR CALLED PT -  TOLD HER TO COME IN FEELS CP ON LEFT SIDE OF CHEST  - 10PM- WHILE SHE WAS SITTING  FEELS PAIN RIGHT SIDE OF ABD - STARTED AT NOON  [facility] WITH CLINIC HEAD HURTS - 10 . TOOK XS TYLENOL 1 TAB AT 1030 TONIGHT - NO RELIEF

## 2021-06-19 ENCOUNTER — Ambulatory Visit (INDEPENDENT_AMBULATORY_CARE_PROVIDER_SITE_OTHER): Payer: Medicaid Other | Admitting: General Practice

## 2021-06-19 VITALS — BP 127/73 | HR 72 | Ht 67.0 in | Wt 229.0 lb

## 2021-06-19 DIAGNOSIS — Z3480 Encounter for supervision of other normal pregnancy, unspecified trimester: Secondary | ICD-10-CM

## 2021-06-19 NOTE — Progress Notes (Signed)
Patient presents to office today for panorama genetic screening. Informal bedside ultrasound performed to assess FHR, FHR 156bpm. Lab work drawn. Patient will return for next OB visit on 12/6.  Chase Caller RN BSN 06/19/21

## 2021-06-19 NOTE — Progress Notes (Signed)
I agree with plan of care as documented by RN. ° °Niemah Schwebke, MD °

## 2021-07-02 ENCOUNTER — Other Ambulatory Visit: Payer: Self-pay

## 2021-07-02 ENCOUNTER — Ambulatory Visit (INDEPENDENT_AMBULATORY_CARE_PROVIDER_SITE_OTHER): Payer: Medicaid Other | Admitting: Advanced Practice Midwife

## 2021-07-02 ENCOUNTER — Encounter: Payer: Self-pay | Admitting: Advanced Practice Midwife

## 2021-07-02 VITALS — BP 122/76 | HR 81 | Wt 229.0 lb

## 2021-07-02 DIAGNOSIS — Z3A13 13 weeks gestation of pregnancy: Secondary | ICD-10-CM

## 2021-07-02 DIAGNOSIS — Z3492 Encounter for supervision of normal pregnancy, unspecified, second trimester: Secondary | ICD-10-CM

## 2021-07-02 DIAGNOSIS — Z3480 Encounter for supervision of other normal pregnancy, unspecified trimester: Secondary | ICD-10-CM
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
## 2021-07-12 ENCOUNTER — Ambulatory Visit: Payer: Medicaid Other

## 2021-07-18 ENCOUNTER — Encounter: Payer: Self-pay | Admitting: General Practice

## 2021-07-22 IMAGING — US US OB < 14 WEEKS - US OB TV
1 series · 15 of 28 positions shown · non-contrast
Comparison: 03/16/2020

CLINICAL DATA: Vaginal bleeding, beta HCG 17,983 on 03/16/2020

EXAM:
OBSTETRIC <14 WK US AND TRANSVAGINAL OB US
TECHNIQUE: Both transabdominal and transvaginal ultrasound examinations were
performed for complete evaluation of the gestation as well as the
maternal uterus, adnexal regions, and pelvic cul-de-sac.
Transvaginal technique was performed to assess early pregnancy.

[Series 1: us ob < 14 weeks - us ob tv · 15 of 55 slices shown]
[im 1/55]
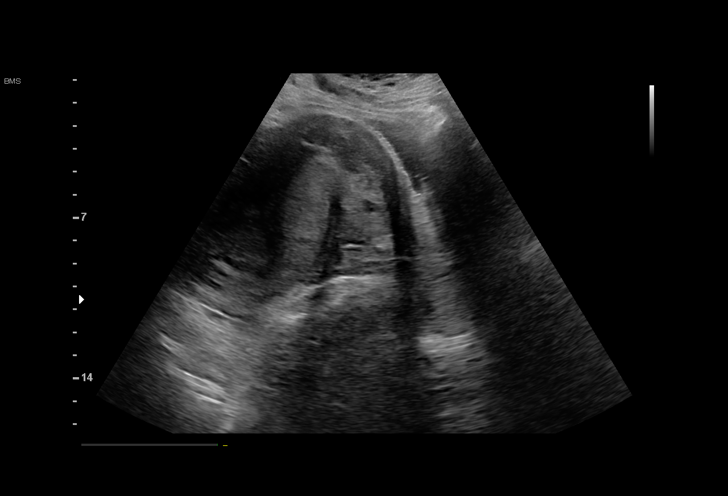
[im 5/55]
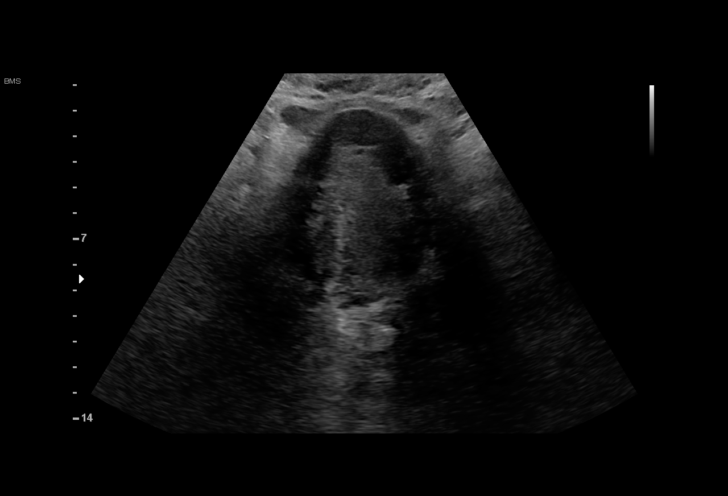
[im 9/55]
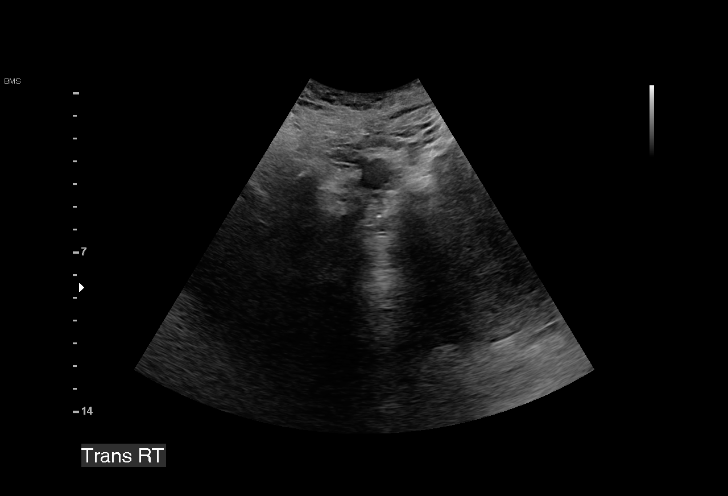
[im 13/55]
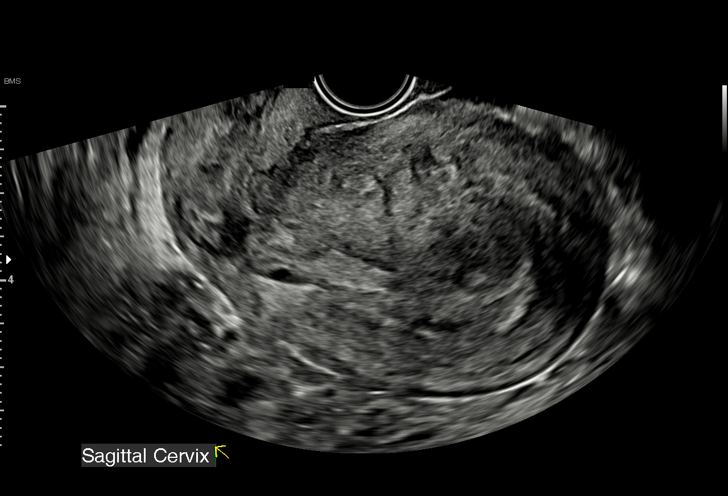
[im 17/55]
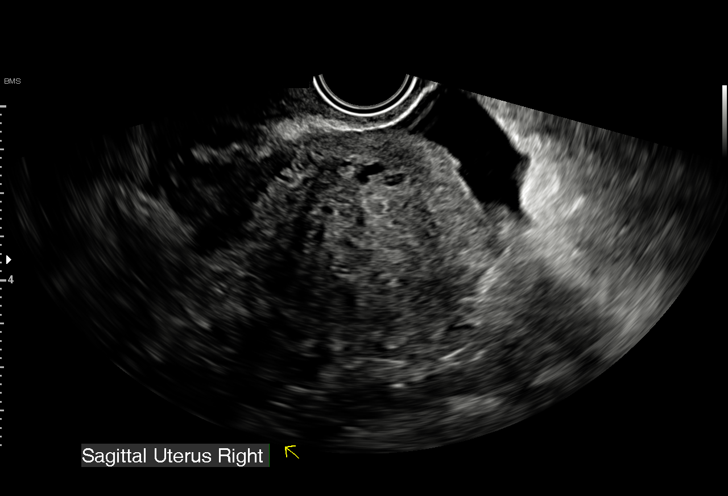
[im 21/55]
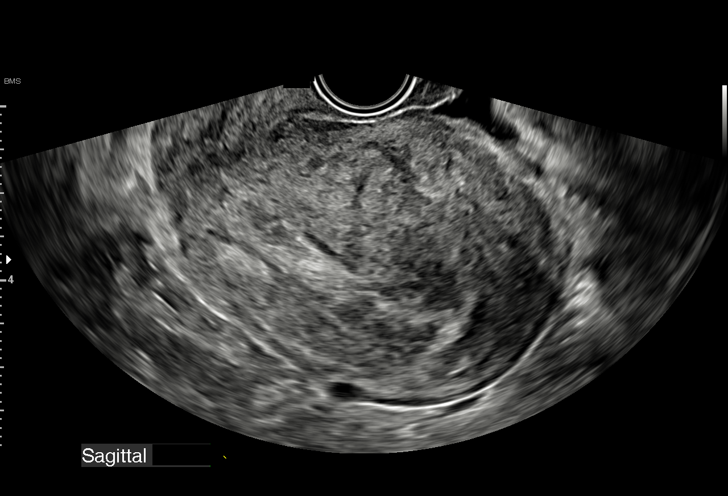
[im 25/55]
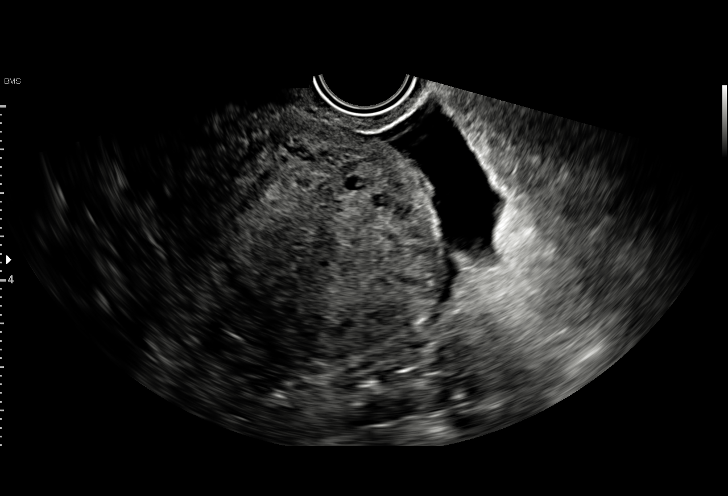
[im 29/55]
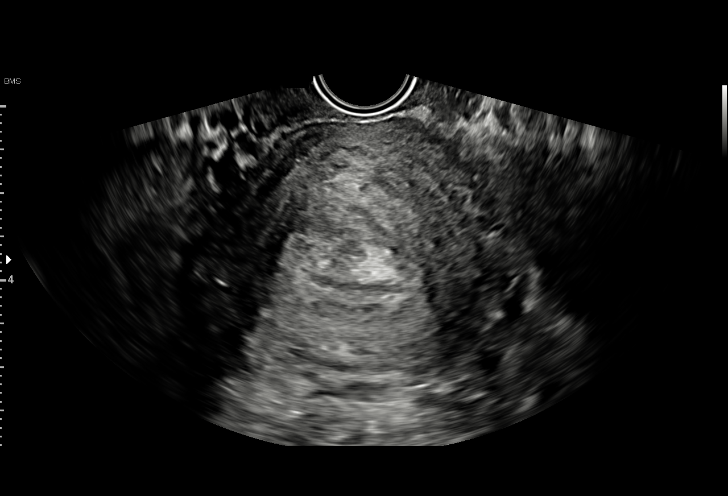
[im 31/55]
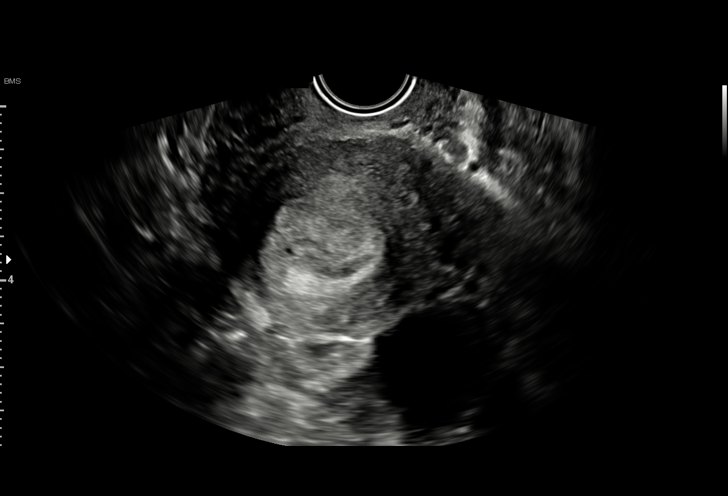
[im 35/55]
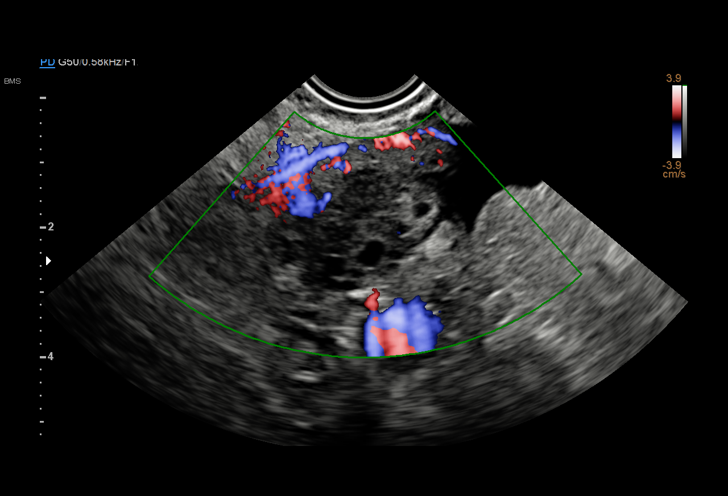
[im 39/55]
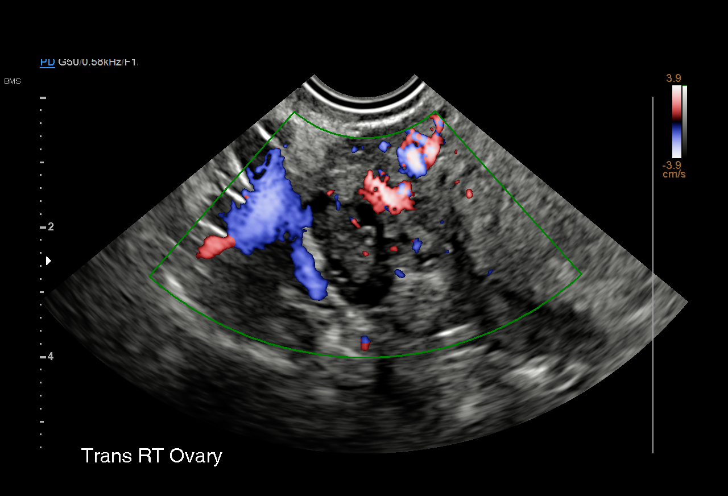
[im 43/55]
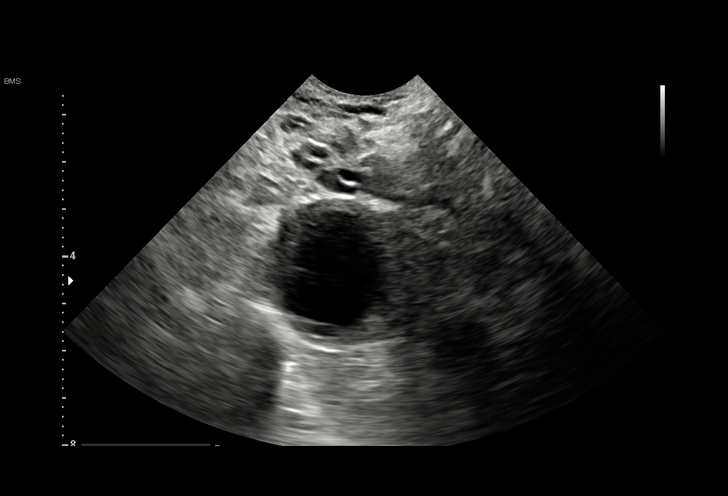
[im 47/55]
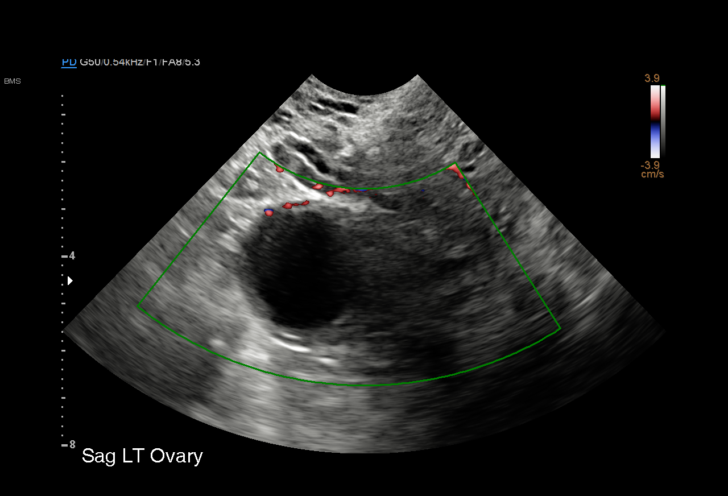
[im 51/55]
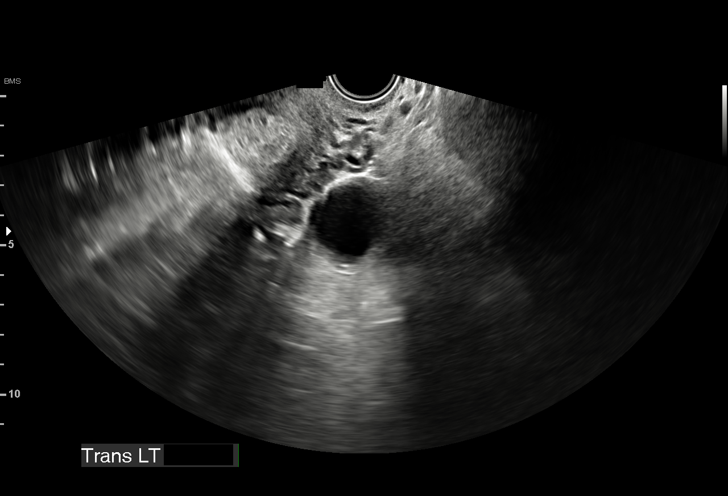
[im 55/55]
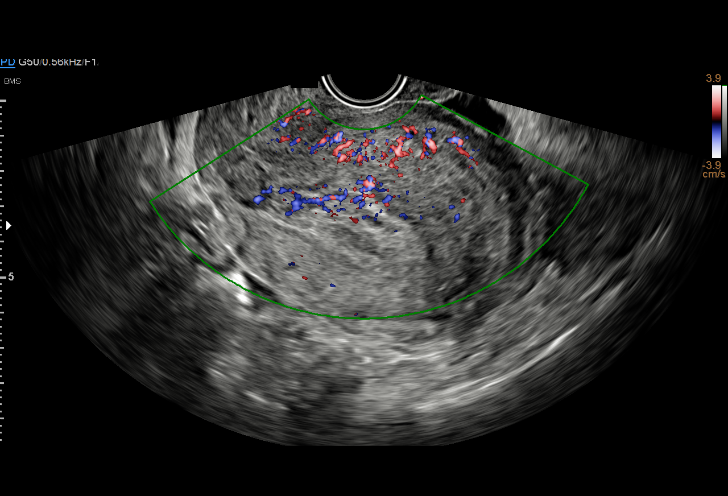

[15 of 28 positions shown; findings below may reference images not displayed]

FINDINGS: Intrauterine gestational sac: None

Yolk sac:  Not Visualized.

Embryo:  Not Visualized.

Maternal uterus/adnexae: Uterus is retroverted. Endometrium is
thickened measuring 19 mm. No evidence of retained products of
conception.

Right ovary measures 2.5 x 1.9 x 1.2 cm and the left ovary measures
4.2 x 3.2 x 3.4 cm. Dominant follicle versus residual of corpus
luteum cyst left ovary measuring 2.5 cm. Small amount of free fluid
within the pelvis.
IMPRESSION: 1. No evidence of intrauterine pregnancy. The previously identified
intrauterine gestational sac and yolk sac are no longer seen.
Findings are compatible with interval spontaneous abortion.
2. Small amount of free fluid within the pelvis.
3. Residual corpus luteum cyst or dominant follicle left ovary.
Otherwise unremarkable adnexal structures.

## 2021-07-30 ENCOUNTER — Telehealth: Payer: Self-pay | Admitting: Family Medicine

## 2021-07-30 NOTE — Telephone Encounter (Signed)
Returned call to patient. Patient reports rash and swelling to face that began 2 days prior to appt request she sent. Appt request was sent on 07/24/21. Describes rash as small bumps, generalized redness to face. Reports feelings of itching and pain on her face. Denies any symptoms elsewhere on body. States this has continued to improve since last week. States swelling has resolved, but rash is still present. Advised pt to send MyChart messages with photos. Explained we will review with provider and contact patient with recommendation tomorrow.

## 2021-07-30 NOTE — Telephone Encounter (Signed)
Patient left this message in appointment requests and wanted to know if a nurse could do anything until her next appointment. Patient did say the swelling has gone down but it is still bumpy.   My face is swollen red and breaking out bad it hurt so bad to touch wash oil anything I do that stretches my face hurt looks like a rash or something

## 2021-08-01 ENCOUNTER — Encounter: Payer: Self-pay | Admitting: Physical Therapy

## 2021-08-01 ENCOUNTER — Other Ambulatory Visit: Payer: Self-pay

## 2021-08-01 ENCOUNTER — Encounter: Payer: Medicaid Other | Attending: Nurse Practitioner | Admitting: Physical Therapy

## 2021-08-01 DIAGNOSIS — O26891 Other specified pregnancy related conditions, first trimester: Secondary | ICD-10-CM | POA: Diagnosis not present

## 2021-08-01 DIAGNOSIS — O34218 Maternal care for other type scar from previous cesarean delivery: Secondary | ICD-10-CM | POA: Diagnosis not present

## 2021-08-01 DIAGNOSIS — R278 Other lack of coordination: Secondary | ICD-10-CM | POA: Insufficient documentation

## 2021-08-01 DIAGNOSIS — Z3A00 Weeks of gestation of pregnancy not specified: Secondary | ICD-10-CM | POA: Insufficient documentation

## 2021-08-01 DIAGNOSIS — R102 Pelvic and perineal pain: Secondary | ICD-10-CM | POA: Insufficient documentation

## 2021-08-01 DIAGNOSIS — L905 Scar conditions and fibrosis of skin: Secondary | ICD-10-CM | POA: Insufficient documentation
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
## 2021-08-02 ENCOUNTER — Inpatient Hospital Stay (HOSPITAL_COMMUNITY)
Admission: AD | Admit: 2021-08-02 | Discharge: 2021-08-03 | Disposition: A | Discharge status: Home / Self Care | Payer: Medicaid Other | Attending: Family Medicine | Admitting: Family Medicine

## 2021-08-02 ENCOUNTER — Other Ambulatory Visit: Payer: Self-pay

## 2021-08-02 ENCOUNTER — Encounter (HOSPITAL_COMMUNITY): Payer: Self-pay | Admitting: Family Medicine

## 2021-08-02 DIAGNOSIS — Z3A18 18 weeks gestation of pregnancy: Secondary | ICD-10-CM | POA: Diagnosis not present

## 2021-08-02 DIAGNOSIS — Z3A28 28 weeks gestation of pregnancy: Secondary | ICD-10-CM | POA: Diagnosis not present

## 2021-08-02 DIAGNOSIS — R519 Headache, unspecified: Secondary | ICD-10-CM | POA: Insufficient documentation

## 2021-08-02 DIAGNOSIS — R109 Unspecified abdominal pain: Secondary | ICD-10-CM

## 2021-08-02 DIAGNOSIS — O26892 Other specified pregnancy related conditions, second trimester: Secondary | ICD-10-CM | POA: Insufficient documentation

## 2021-08-02 DIAGNOSIS — R102 Pelvic and perineal pain: Secondary | ICD-10-CM | POA: Insufficient documentation

## 2021-08-02 DIAGNOSIS — Z20822 Contact with and (suspected) exposure to covid-19: Secondary | ICD-10-CM | POA: Insufficient documentation

## 2021-08-02 DIAGNOSIS — N949 Unspecified condition associated with female genital organs and menstrual cycle: Secondary | ICD-10-CM

## 2021-08-02 LAB — URINALYSIS, ROUTINE W REFLEX MICROSCOPIC
Bilirubin Urine: NEGATIVE
Glucose, UA: NEGATIVE mg/dL
Hgb urine dipstick: NEGATIVE
Ketones, ur: NEGATIVE mg/dL
Leukocytes,Ua: NEGATIVE
Nitrite: NEGATIVE
Protein, ur: NEGATIVE mg/dL
Specific Gravity, Urine: >1.03 — ABNORMAL HIGH (ref 1.005–1.030)
pH: 6 (ref 5.0–8.0)

## 2021-08-02 LAB — WET PREP, GENITAL
Clue Cells Wet Prep HPF POC: NONE SEEN
Sperm: NONE SEEN
Trich, Wet Prep: NONE SEEN
WBC, Wet Prep HPF POC: <10 (ref <10)
Yeast Wet Prep HPF POC: NONE SEEN

## 2021-08-02 MED ORDER — CYCLOBENZAPRINE HCL 5 MG PO TABS
10.0000 mg | ORAL_TABLET | Freq: Once | ORAL | Status: AC
  Administered 2021-08-02: 10 mg via ORAL
  Filled 2021-08-02: qty 2

## 2021-08-02 NOTE — MAU Provider Note (Signed)
History     CSN: [id number]  Arrival date and time: 08/02/21 2149   Event Date/Time   First Provider Initiated Contact with Patient 08/02/21 2318      Chief Complaint  Patient presents with   Headache   Abdominal Pain   Julia Armstrong is a 31 y.o. [id number] at 7862w2d by Early US who [facility].  She presents today for Headache and Abdominal Pain.  She states that she her HA started yesterday and is "severe."  She states she took one XR tylenol last night prior to bed and had difficulty sleeping due to the pain.  She states when she awoke in the AM she continued to have the HA which is constant.  She states the pain is worsened with laying down, but has no relieving factors.  She rates the pain a 10/10 and reports she took one XR tylenol ~ 2hrs prior to arrival with no relief in any of her symptoms. She also reports abdominal pain that started this morning around 0500.  She describes it as "sharp and constant."  However, she goes on to report that the pain is intermittent and noted with certain positions. She denies issues with intake and last ate prior to arrival. She endorses fetal movement, but states she hasn't felt movement in ~ 3 days. However, patient refused dopplers stating that her abdomen is sore d/t C/S history.     OB History     Gravida  8   Para  4   Term  4   Preterm      AB  3   Living  4      SAB  3   IAB      Ectopic      Multiple  0   Live Births  4           Past Medical History:  Diagnosis Date   Asthma    Gallstones    Missed abortion 05/09/2020   Wound infection after surgery 2016   c-section incision had to be reopened and wound vac placed    Past Surgical History:  Procedure Laterality Date   CESAREAN SECTION     C/S x 3   CESAREAN SECTION N/A 05/10/2018   Procedure: REPEAT CESAREAN SECTION;  Surgeon: Frederickson BingPickens, Charlie, MD;  [facility];  Service: Obstetrics;  Laterality: N/A;   DILATION AND  EVACUATION N/A 02/18/2017   Procedure: DILATATION AND EVACUATION;  Surgeon: Tereso NewcomerAnyanwu, Ugonna A, MD;  Location: [facility];  Service: Gynecology;  Laterality: N/A;   DILATION AND EVACUATION N/A 05/09/2020   Procedure: DILATATION AND EVACUATION;  Surgeon: Adam PhenixArnold, James G, MD;  Location: [facility];  Service: Gynecology;  Laterality: N/A;   WISDOM TOOTH EXTRACTION      Family History  Problem Relation Age of Onset   Cancer Other    Cancer Maternal Grandmother        breast cancer   Healthy Mother    Healthy Father     Social History   Tobacco Use   Smoking status: Never   Smokeless tobacco: Never  Vaping Use   Vaping Use: Never used  Substance Use Topics   Alcohol use: Not Currently   Drug use: No    Allergies: No Known Allergies  Medications Prior to Admission  Medication Sig Dispense Refill Last Dose   albuterol (VENTOLIN HFA) 108 (90 Base) MCG/ACT inhaler Inhale 2 puffs into the lungs every 6 (six) hours as  needed for wheezing or shortness of breath. 18 g 0    Blood Pressure Monitoring DEVI 1 each by Does not apply route once a week. 1 each 0    Doxylamine-Pyridoxine (DICLEGIS) 10-10 MG TBEC Take 2 tablets at bedtime and one in the morning and one in the afternoon as needed for nausea. 60 tablet 2    Prenatal Vit-Fe Fumarate-FA (PRENATAL VITAMIN) 27-0.8 MG TABS Take 1 tablet by mouth daily. 30 tablet 11    promethazine (PHENERGAN) 25 MG tablet Take 1 tablet (25 mg total) by mouth every 6 (six) hours as needed for nausea or vomiting. 30 tablet 0     Review of Systems  Constitutional:  Negative for chills and fever.  Eyes:  Positive for visual disturbance (Blurry Last Night).  Respiratory:  Negative for cough.   Gastrointestinal:  Positive for abdominal pain. Negative for constipation, diarrhea, nausea and vomiting.  Genitourinary:  Negative for difficulty urinating, dysuria, vaginal bleeding and vaginal discharge.  Neurological:  Positive for headaches (Prominent  on Left Side). Negative for dizziness and light-headedness.  Physical Exam   Blood pressure (!) 102/40, pulse 70, last menstrual period 03/01/2021.  Physical Exam Vitals reviewed.  Constitutional:      Appearance: She is well-developed.  HENT:     Head: Normocephalic and atraumatic.  Cardiovascular:     Rate and Rhythm: Normal rate and regular rhythm.  Pulmonary:     Effort: Pulmonary effort is normal. No respiratory distress.     Breath sounds: Normal breath sounds.  Abdominal:     General: Bowel sounds are normal.     Palpations: Abdomen is soft.     Tenderness: There is no abdominal tenderness.  Musculoskeletal:        General: Normal range of motion.     Cervical back: Normal range of motion.  Skin:    General: Skin is warm and dry.  Neurological:     Mental Status: She is alert and oriented to person, place, and time.  Psychiatric:        Mood and Affect: Mood normal.        Behavior: Behavior normal.    MAU Course  Procedures Results for orders placed or performed during the hospital encounter of 08/02/21 (from the past 24 hour(s))  Wet prep, genital     Status: None   Collection Time: 08/02/21 11:27 PM  Result Value Ref Range   Yeast Wet Prep HPF POC NONE SEEN NONE SEEN   Trich, Wet Prep NONE SEEN NONE SEEN   Clue Cells Wet Prep HPF POC NONE SEEN NONE SEEN   WBC, Wet Prep HPF POC <10 <10   Sperm NONE SEEN   Resp Panel by RT-PCR (Flu A&B, Covid)     Status: None   Collection Time: 08/02/21 11:34 PM   Specimen: Nasopharyngeal(NP) swabs in vial transport medium  Result Value Ref Range   SARS Coronavirus 2 by RT PCR NEGATIVE NEGATIVE   Influenza A by PCR NEGATIVE NEGATIVE   Influenza B by PCR NEGATIVE NEGATIVE  Urinalysis, Routine w reflex microscopic     Status: Abnormal   Collection Time: 08/02/21 11:36 PM  Result Value Ref Range   Color, Urine YELLOW YELLOW   APPearance CLEAR CLEAR   Specific Gravity, Urine >1.030 (H) 1.005 - 1.030   pH 6.0 5.0 - 8.0    Glucose, UA NEGATIVE NEGATIVE mg/dL   Hgb urine dipstick NEGATIVE NEGATIVE   Bilirubin Urine NEGATIVE NEGATIVE   Ketones, ur NEGATIVE  NEGATIVE mg/dL   Protein, ur NEGATIVE NEGATIVE mg/dL   Nitrite NEGATIVE NEGATIVE   Leukocytes,Ua NEGATIVE NEGATIVE    MDM Physical Exam Cultures: Wet prep, GC/CT, Covid Labs: UA HA Cocktail BSUS Assessment and Plan  31 year old, [id number]  SIUP at 18.3 weeks Headache Round Ligament Pain  -Dr. Shawnie Pons on unit and recommends COVID testing. -Reviewed POC with patient. -Patient initially refusing testing but after discussion agreeable. -Also discussed collection of swabs for testing of vaginal infection/disease.  Patient agreeable and will self-swab. -Exam performed.  -Discussed usage of IV pain medications for treatment of headache. -We will give Compazine, Benadryl, and IV fluids. -Discussed provider returning to complete bedside ultrasound for fetal evaluation.   Cherre Robins 08/02/2021, 11:18 PM   Reassessment (2:40 AM)  -Patient reports headache has resolved. -Bedside ultrasound performed with fetal movement and heartbeat noted. -Educated on round ligament pain and how to manage. -Precautions given. -Reviewed usage of Tylenol in pregnancy and will provide .  Pregnancy Safe Medication list in AVS. -Patient without questions or concerns. -Encouraged to call primary office or return to MAU if symptoms worsen or with the onset of new symptoms. -Discharged to home in improved condition.  Cherre Robins MSN, CNM Advanced Practice Provider, [facility] [employer]

## 2021-08-02 NOTE — MAU Note (Signed)
Pt here for severe HA and abdominal pain since yesterday. Took tylenol, not helping. Took tylenol 2 hour before coming.

## 2021-08-03 LAB — RESP PANEL BY RT-PCR (FLU A&B, COVID) ARPGX2
Influenza A by PCR: NEGATIVE
Influenza B by PCR: NEGATIVE
SARS Coronavirus 2 by RT PCR: NEGATIVE

## 2021-08-03 MED ORDER — DIPHENHYDRAMINE HCL 50 MG/ML IJ SOLN
25.0000 mg | Freq: Once | INTRAMUSCULAR | Status: AC
  Administered 2021-08-03: 25 mg via INTRAVENOUS
  Filled 2021-08-03: qty 1

## 2021-08-03 MED ORDER — LACTATED RINGERS IV BOLUS
1000.0000 mL | Freq: Once | INTRAVENOUS | Status: AC
  Administered 2021-08-03: 1000 mL via INTRAVENOUS

## 2021-08-03 MED ORDER — PROCHLORPERAZINE EDISYLATE 10 MG/2ML IJ SOLN
10.0000 mg | Freq: Once | INTRAMUSCULAR | Status: AC
  Administered 2021-08-03: 10 mg via INTRAVENOUS
  Filled 2021-08-03: qty 2

## 2021-08-03 NOTE — Discharge Instructions (Signed)

## 2021-08-05 ENCOUNTER — Other Ambulatory Visit: Payer: Self-pay

## 2021-08-05 ENCOUNTER — Ambulatory Visit (INDEPENDENT_AMBULATORY_CARE_PROVIDER_SITE_OTHER): Payer: Medicaid Other | Admitting: Family Medicine

## 2021-08-05 ENCOUNTER — Other Ambulatory Visit: Payer: Self-pay | Admitting: Nurse Practitioner

## 2021-08-05 ENCOUNTER — Other Ambulatory Visit: Payer: Self-pay | Admitting: *Deleted

## 2021-08-05 ENCOUNTER — Encounter: Payer: Self-pay | Admitting: Family Medicine

## 2021-08-05 ENCOUNTER — Ambulatory Visit: Payer: Medicaid Other | Attending: Nurse Practitioner

## 2021-08-05 VITALS — BP 118/67 | HR 82 | Wt 237.0 lb

## 2021-08-05 DIAGNOSIS — Z6837 Body mass index (BMI) 37.0-37.9, adult: Secondary | ICD-10-CM

## 2021-08-05 DIAGNOSIS — O34219 Maternal care for unspecified type scar from previous cesarean delivery: Secondary | ICD-10-CM

## 2021-08-05 DIAGNOSIS — Z3A18 18 weeks gestation of pregnancy: Secondary | ICD-10-CM

## 2021-08-05 DIAGNOSIS — O99212 Obesity complicating pregnancy, second trimester: Secondary | ICD-10-CM | POA: Insufficient documentation

## 2021-08-05 DIAGNOSIS — Z3492 Encounter for supervision of normal pregnancy, unspecified, second trimester: Secondary | ICD-10-CM

## 2021-08-05 DIAGNOSIS — Z3480 Encounter for supervision of other normal pregnancy, unspecified trimester: Secondary | ICD-10-CM | POA: Diagnosis present

## 2021-08-05 DIAGNOSIS — Z363 Encounter for antenatal screening for malformations: Secondary | ICD-10-CM | POA: Diagnosis not present

## 2021-08-05 DIAGNOSIS — Z362 Encounter for other antenatal screening follow-up: Secondary | ICD-10-CM

## 2021-08-05 DIAGNOSIS — Z98891 History of uterine scar from previous surgery: Secondary | ICD-10-CM

## 2021-08-05 DIAGNOSIS — R7309 Other abnormal glucose: Secondary | ICD-10-CM | POA: Insufficient documentation

## 2021-08-05 LAB — GC/CHLAMYDIA PROBE AMP (~~LOC~~) NOT AT ARMC
Chlamydia: NEGATIVE
Comment: NEGATIVE
Comment: NORMAL
Neisseria Gonorrhea: NEGATIVE

## 2021-08-05 NOTE — Progress Notes (Signed)
Patient mentioned recent MAU visit. She stated she went due to a "severe" headache along with pelvic cramps. Patient has not had any pain/headaches since then. Denies vaginal bleeding and stated that she is feeling baby move.

## 2021-08-05 NOTE — Progress Notes (Signed)
° °  Subjective:  Julia Armstrong is a 31 y.o. [phone] at [redacted]w[redacted]d being seen today for ongoing prenatal care.  She is currently monitored for the following issues for this low-risk pregnancy and has Previous cesarean delivery, antepartum; History of depression; Obesity (BMI 35.0-39.9 without comorbidity); Syncope; Wound infection after surgery; Supervision of other normal pregnancy, antepartum; and Elevated hemoglobin A1c on their problem list.  Patient reports no complaints.  Contractions: Not present. Vag. Bleeding: None.  Movement: Present. Denies leaking of fluid.   The following portions of the patient's history were reviewed and updated as appropriate: allergies, current medications, past family history, past medical history, past social history, past surgical history and problem list. Problem list updated.  Objective:   Vitals:   08/05/21 1321  BP: 118/67  Pulse: 82  Weight: 237 lb (107.5 kg)    Fetal Status: Fetal Heart Rate (bpm): 151   Movement: Present     General:  Alert, oriented and cooperative. Patient is in no acute distress.  Skin: Skin is warm and dry. No rash noted.   Cardiovascular: Normal heart rate noted  Respiratory: Normal respiratory effort, no problems with respiration noted  Abdomen: Soft, gravid, appropriate for gestational age. Pain/Pressure: Absent     Pelvic: Vag. Bleeding: None     Cervical exam deferred        Extremities: Normal range of motion.  Edema: Trace  Mental Status: Normal mood and affect. Normal behavior. Normal judgment and thought content.     Assessment and Plan:  Pregnancy: [id number] at [redacted]w[redacted]d  1. Encounter for supervision of normal pregnancy in second trimester, unspecified gravidity 2. [redacted] weeks gestation of pregnancy Doing well. Fundal height and fetal heart tones appropriate. Denies any other concerns today - Anatomy ultrasound scheduled today - AFP today - follow up in 4 weeks  3. History of prior cesarean section Patient with  4 prior cesarean sections. Repeat Cesarean 4 weeks.  4. Elevated HgB a1c  Early a1c prediabetic range (5.8). Given elevated a1c, age, and BMI discussed early GTT as she is at increased risk for GDM. Patient amenable to getting tested - schedule fasting 2 hr GTT for later this week  Preterm labor symptoms and general obstetric precautions including but not limited to vaginal bleeding, contractions, leaking of fluid and fetal movement were reviewed in detail with the patient. Please refer to After Visit Summary for other counseling recommendations.  Return in about 4 weeks (around 09/02/2021) for 2-3 days lab visit for early GTT and LROB in 4 weeks with any provider.  Warner Mccreedy, MD, MPH OB Fellow, Faculty Practice

## 2021-08-06 ENCOUNTER — Other Ambulatory Visit: Payer: Medicaid Other

## 2021-08-06 DIAGNOSIS — Z3492 Encounter for supervision of normal pregnancy, unspecified, second trimester: Secondary | ICD-10-CM

## 2021-08-06 DIAGNOSIS — R7309 Other abnormal glucose: Secondary | ICD-10-CM

## 2021-08-06 DIAGNOSIS — Z3A18 18 weeks gestation of pregnancy: Secondary | ICD-10-CM

## 2021-08-07 LAB — GLUCOSE TOLERANCE, 2 HOURS W/ 1HR
Glucose, 1 hour: 130 mg/dL (ref 70–179)
Glucose, 2 hour: 110 mg/dL (ref 70–152)
Glucose, Fasting: 89 mg/dL (ref 70–91)

## 2021-08-07 LAB — AFP, SERUM, OPEN SPINA BIFIDA
AFP MoM: 0.71
AFP Value: 29.2 ng/mL
Gest. Age on Collection Date: 18.5 weeks
Maternal Age At EDD: 29.5 yr
OSBR Risk 1 IN: 10000
Test Results:: NEGATIVE
Weight: 237 [lb_av]

## 2021-08-08 ENCOUNTER — Telehealth: Payer: Self-pay | Admitting: Physical Therapy

## 2021-08-08 ENCOUNTER — Encounter: Payer: Medicaid Other | Admitting: Physical Therapy

## 2021-08-08 NOTE — Telephone Encounter (Signed)
Called patient about her missed appointment today at 15:00. She forgot about her appointment and was presently at an appointment for her teeth. Therapist did educated patient on out no-show/cancellation policy. She reports she will be at her appointment on 08/15/2021/  Eulis Foster, PT @1 /06/2022@ 3:25 PM

## 2021-08-10 ENCOUNTER — Encounter: Payer: Self-pay | Admitting: Obstetrics and Gynecology

## 2021-08-12 ENCOUNTER — Encounter: Payer: Self-pay | Admitting: *Deleted

## 2021-08-15 ENCOUNTER — Encounter: Payer: Self-pay | Admitting: Physical Therapy

## 2021-08-15 ENCOUNTER — Other Ambulatory Visit: Payer: Self-pay

## 2021-08-15 ENCOUNTER — Ambulatory Visit (INDEPENDENT_AMBULATORY_CARE_PROVIDER_SITE_OTHER): Payer: Medicaid Other

## 2021-08-15 VITALS — BP 121/71 | HR 86 | Wt 235.3 lb

## 2021-08-15 DIAGNOSIS — Z0130 Encounter for examination of blood pressure without abnormal findings: Secondary | ICD-10-CM

## 2021-08-15 NOTE — Progress Notes (Signed)
Pt here today for BP check after recommendation Dr. Damita Dunnings to f/u from [employer] report of elevated BP.  BP 121/71. Pt reports that she has headaches that she refuses to take medication for as she had heard that Tylenol causes autusim.  Pt denies hx of migraines.  Pt brought her home BP cuff and she demonstrated how she uses it at home.  Pt states that she feels that her cuff is not effective however her sister is a nurse and takes her BP with a manual cuff.  I advised pt that she can continue to allow her sister to take her BP and log that reading once a week or get another BP monitor with larger cuff.  Pt verbalizes understanding with no further questions.  Frances Nickels  08/15/21

## 2021-08-22 ENCOUNTER — Other Ambulatory Visit: Payer: Self-pay

## 2021-08-22 ENCOUNTER — Encounter: Payer: Medicaid Other | Admitting: Physical Therapy

## 2021-08-22 ENCOUNTER — Encounter: Payer: Self-pay | Admitting: Physical Therapy

## 2021-08-22 DIAGNOSIS — L905 Scar conditions and fibrosis of skin: Secondary | ICD-10-CM

## 2021-08-22 DIAGNOSIS — O26891 Other specified pregnancy related conditions, first trimester: Secondary | ICD-10-CM | POA: Diagnosis not present

## 2021-08-22 DIAGNOSIS — R278 Other lack of coordination: Secondary | ICD-10-CM

## 2021-08-22 NOTE — Patient Instructions (Signed)
Access Code: [email or web address] URL: [email or web address] Date: 08/22/2021 Prepared by: Eulis Foster  Exercises Hooklying Transversus Abdominis Palpation - 1 x daily - 7 x weekly - 1 sets - 10 reps Standing with Back Flat Against Wall - 1 x daily - 7 x weekly - 1 sets - 10 reps Standing Abdominal Wall Shortening and Diastasis Recti Correction - 1 x daily - 7 x weekly - 1 sets - 10 reps Eulis Foster, PT [facility] [address], [address] [place], [place] [address] W: [phone] [email or web address]

## 2021-08-22 NOTE — Therapy (Signed)
[facility] [place] [facility] [address] [place], [place], [address] Phone: [phone]   Fax:  [phone]  Physical Therapy Treatment  Patient Details  Name: Julia Armstrong MRN: [id number] Date of Birth: 20-Oct-1990 Referring Provider (PT): Earlie Server, NP   Encounter Date: 08/22/2021   PT End of Session - 08/22/21 1602     Visit Number 2    Date for PT Re-Evaluation 10/24/21    Authorization Type UHC medicare    Authorization - Visit Number 2    Authorization - Number of Visits 27    PT Start Time 1500    PT Stop Time 1545    PT Time Calculation (min) 45 min    Activity Tolerance Patient tolerated treatment well;No increased pain    Behavior During Therapy [facility] for tasks assessed/performed             Past Medical History:  Diagnosis Date   Asthma    Drug use 02/26/2018   UDS + for cocaine and MJ in 2018, no other UDS done since then UDS done after syncopal episode 02/26/18 showed MJ (pt denies any recent use)   Gallstones    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   Missed abortion 05/09/2020   Second degree burn of multiple fingers of right hand including thumb 10/09/2016   Wound infection after surgery 2016   c-section incision had to be reopened and wound vac placed    Past Surgical History:  Procedure Laterality Date   CESAREAN SECTION     C/S x 3   CESAREAN SECTION N/A 05/10/2018   Procedure: REPEAT CESAREAN SECTION;  Surgeon: Aletha Halim, MD;  Location: [place];  Service: Obstetrics;  Laterality: N/A;   DILATION AND EVACUATION N/A 02/18/2017   Procedure: DILATATION AND EVACUATION;  Surgeon: Osborne Oman, MD;  Location: [place] [facility];  Service: Gynecology;  Laterality: N/A;   DILATION AND EVACUATION N/A 05/09/2020   Procedure: DILATATION AND EVACUATION;  Surgeon: Woodroe Mode, MD;  Location: [place];  Service: Gynecology;  Laterality: N/A;   WISDOM TOOTH  EXTRACTION      There were no vitals filed for this visit.   Subjective Assessment - 08/22/21 1510     Subjective I have been doing the massages and they are helping. I only have pain when I put my pants on the line. My pain is not constant anymore. 4 months along.    Patient Stated Goals reduce pain    Currently in Pain? Yes    Pain Score 7     Pain Location Abdomen    Pain Orientation Lower    Pain Descriptors / Indicators Sharp    Pain Type Chronic pain    Pain Onset More than a month ago    Pain Frequency Intermittent    Aggravating Factors  when pants are on the area    Pain Relieving Factors not touching the scar    Multiple Pain Sites No                               [facility] Adult PT Treatment/Exercise - 08/22/21 0001       Lumbar Exercises: Standing   Other Standing Lumbar Exercises standing with back against the wall contracting the abdoment and using tactile cues to assist the muscles      Lumbar Exercises: Supine   Ab Set 10 reps;3  seconds    AB Set Limitations in reclined position and tactile cues to assist abdomen      Lumbar Exercises: Quadruped   Other Quadruped Lumbar Exercises contracting the abdominals 10x      Manual Therapy   Manual Therapy Soft tissue mobilization    Soft tissue mobilization manual mobilization to the c-section scar, above the pubic bone and sides of the abdomen to improve the tissue mobility and reduce pain; patient held her abdomen up so therapist could get to the tissue                     PT Education - 08/22/21 1553     Education Details Access Code: [email or web address]    Person(s) Educated Patient    Methods Explanation;Demonstration;Verbal cues;Handout    Comprehension Returned demonstration;Verbalized understanding              PT Short Term Goals - 08/22/21 1600       PT SHORT TERM GOAL #1   Title independent iwith scar massage to reduce sensitivity    Time 4    Period Weeks    Status  Achieved               PT Long Term Goals - 08/01/21 1705       PT LONG TERM GOAL #1   Title C-section scar pain level when touched by clothing decreased </= 2-3/10 compared to 9/10 due to restrictions    Baseline pain level 9/10    Time 12    Period Weeks    Status New    Target Date 10/24/21      PT LONG TERM GOAL #2   Title able to have an ultrasound to see how her pregnancy is progressing due to pain decreased >/= 2-3/10    Baseline pain level 9/10    Time 12    Period Weeks    Status New    Target Date 10/24/21      PT LONG TERM GOAL #3   Title Patient is able to extend her spine by 25% more due to decreased restrictions of the c-section    Baseline limited by 50% due to abdominal pain    Time 12    Period Weeks    Status New    Target Date 10/24/21      PT LONG TERM GOAL #4   Title educated on c-section scar management and how to engage her abdominals correctly postpartum to reduce chances of increased pain    Baseline not educated yet    Time 12    Period Weeks    Status New    Target Date 10/24/21                   Plan - 08/22/21 1553     Clinical Impression Statement Patient is doing her c-section scar work at home. Her pain has reduced to only having it when her pants lay on the scar. The skin and tissue is still tight. Therapist goes slow with the manual work due to the tissue is thin. She has tenderness located on the scar and suprapubically. Patient has to hold her abdomen up so therapist can get to the area. She is learning how to engage her transverse abdominus muscle in different positions but still needs tactile cues. Patient will benefit from skilled therapy to improve c-section scar mobility to allow her abdomen to expand with her pregnancy and for her to have routine  ultrasounds during pregnancy.    Personal Factors and Comorbidities Comorbidity 3+;Past/Current Experience    Comorbidities 4 c-sections    Examination-Activity Limitations  Dressing    Examination-Participation Restrictions Other   having ultrasounds   Stability/Clinical Decision Making Stable/Uncomplicated    Rehab Potential Excellent    PT Frequency 1x / week    PT Duration 12 weeks    PT Treatment/Interventions ADLs/Self Care Home Management;Therapeutic activities;Therapeutic exercise;Neuromuscular re-education;Patient/family education;Manual techniques;Scar mobilization;Taping    PT Next Visit Plan work on c-section scar, daliy tasks with correct abdominal contraction; education for post partum to take care of c-section; belly band    PT Home Exercise Plan Access Code: [email or web address]    Recommended Other Services MD signed initial note    Consulted and Agree with Plan of Care Patient             Patient will benefit from skilled therapeutic intervention in order to improve the following deficits and impairments:  Pain, Decreased scar mobility, Increased fascial restricitons, Decreased mobility, Decreased range of motion  Visit Diagnosis: Other lack of coordination  Pain in scar     Problem List Patient Active Problem List   Diagnosis Date Noted   Elevated hemoglobin A1c 08/05/2021   Supervision of other normal pregnancy, antepartum 05/21/2021   Syncope 02/16/2018   Obesity (BMI 35.0-39.9 without comorbidity) 12/01/2017   Previous cesarean delivery, antepartum 11/03/2017   History of depression 11/03/2017   Wound infection after surgery 02/28/2015    Earlie Counts, PT 08/22/21 4:02 PM  [place] [facility] [address] [place], [place], [address] Phone: [phone]   Fax:  [phone]  Name: CHASSIE SECORA MRN: [id number] Date of Birth: 07-24-1991

## 2021-09-02 ENCOUNTER — Ambulatory Visit: Payer: Medicaid Other | Admitting: *Deleted

## 2021-09-02 ENCOUNTER — Other Ambulatory Visit: Payer: Self-pay

## 2021-09-02 ENCOUNTER — Ambulatory Visit: Payer: Medicaid Other | Attending: Obstetrics

## 2021-09-02 ENCOUNTER — Encounter: Payer: Self-pay | Admitting: *Deleted

## 2021-09-02 VITALS — BP 121/66 | HR 87

## 2021-09-02 DIAGNOSIS — O34219 Maternal care for unspecified type scar from previous cesarean delivery: Secondary | ICD-10-CM | POA: Insufficient documentation

## 2021-09-02 DIAGNOSIS — Z363 Encounter for antenatal screening for malformations: Secondary | ICD-10-CM | POA: Insufficient documentation

## 2021-09-02 DIAGNOSIS — Z3A22 22 weeks gestation of pregnancy: Secondary | ICD-10-CM | POA: Insufficient documentation

## 2021-09-02 DIAGNOSIS — O99212 Obesity complicating pregnancy, second trimester: Secondary | ICD-10-CM | POA: Insufficient documentation

## 2021-09-02 DIAGNOSIS — Z362 Encounter for other antenatal screening follow-up: Secondary | ICD-10-CM

## 2021-09-02 DIAGNOSIS — Z3480 Encounter for supervision of other normal pregnancy, unspecified trimester: Secondary | ICD-10-CM

## 2021-09-02 DIAGNOSIS — Z6837 Body mass index (BMI) 37.0-37.9, adult: Secondary | ICD-10-CM

## 2021-09-03 ENCOUNTER — Other Ambulatory Visit: Payer: Self-pay | Admitting: *Deleted

## 2021-09-03 DIAGNOSIS — Z3689 Encounter for other specified antenatal screening: Secondary | ICD-10-CM

## 2021-09-03 DIAGNOSIS — O34219 Maternal care for unspecified type scar from previous cesarean delivery: Secondary | ICD-10-CM

## 2021-09-03 DIAGNOSIS — Z6836 Body mass index (BMI) 36.0-36.9, adult: Secondary | ICD-10-CM

## 2021-09-05 ENCOUNTER — Encounter: Payer: Medicaid Other | Admitting: Obstetrics & Gynecology

## 2021-09-10 ENCOUNTER — Encounter: Payer: Self-pay | Admitting: Family Medicine

## 2021-09-10 ENCOUNTER — Other Ambulatory Visit: Payer: Self-pay

## 2021-09-10 ENCOUNTER — Other Ambulatory Visit (HOSPITAL_COMMUNITY)
Admission: RE | Admit: 2021-09-10 | Discharge: 2021-09-10 | Disposition: A | Discharge status: Home / Self Care | Payer: Medicaid Other | Source: Ambulatory Visit | Attending: Obstetrics & Gynecology | Admitting: Obstetrics & Gynecology

## 2021-09-10 ENCOUNTER — Ambulatory Visit (INDEPENDENT_AMBULATORY_CARE_PROVIDER_SITE_OTHER): Payer: Medicaid Other | Admitting: Nurse Practitioner

## 2021-09-10 VITALS — BP 118/68 | HR 96 | Wt 242.2 lb

## 2021-09-10 DIAGNOSIS — N898 Other specified noninflammatory disorders of vagina: Secondary | ICD-10-CM | POA: Insufficient documentation

## 2021-09-10 DIAGNOSIS — Z3A23 23 weeks gestation of pregnancy: Secondary | ICD-10-CM

## 2021-09-10 DIAGNOSIS — Z98891 History of uterine scar from previous surgery: Secondary | ICD-10-CM

## 2021-09-10 DIAGNOSIS — Z3492 Encounter for supervision of normal pregnancy, unspecified, second trimester: Secondary | ICD-10-CM

## 2021-09-10 NOTE — Progress Notes (Signed)
° ° °  Subjective:  Julia Armstrong is a 31 y.o. [id number] at [redacted]w[redacted]d being seen today for ongoing prenatal care.  She is currently monitored for the following issues for this low-risk pregnancy and has Previous cesarean delivery, antepartum; History of depression; Obesity (BMI 35.0-39.9 without comorbidity); Syncope; Wound infection after surgery; Supervision of other normal pregnancy, antepartum; and Elevated hemoglobin A1c on their problem list.  Patient reports no complaints.  Contractions: Not present.  .  Movement: Present. Denies leaking of fluid.   The following portions of the patient's history were reviewed and updated as appropriate: allergies, current medications, past family history, past medical history, past social history, past surgical history and problem list. Problem list updated.  Objective:   Vitals:   09/10/21 1539  BP: 118/68  Pulse: 96  Weight: 242 lb 3.2 oz (109.9 kg)    Fetal Status: Fetal Heart Rate (bpm): 132 Fundal Height: 31 cm Movement: Present     General:  Alert, oriented and cooperative. Patient is in no acute distress.  Skin: Skin is warm and dry. No rash noted.   Cardiovascular: Normal heart rate noted  Respiratory: Normal respiratory effort, no problems with respiration noted  Abdomen: Soft, gravid, appropriate for gestational age. Pain/Pressure: Absent     Pelvic:  Cervical exam deferred        Extremities: Normal range of motion.  Edema: None  Mental Status: Normal mood and affect. Normal behavior. Normal judgment and thought content.   Urinalysis:      Assessment and Plan:  Pregnancy: [id number] at [redacted]w[redacted]d  1. Encounter for supervision of normal pregnancy in second trimester, unspecified gravidity Doing well, baby moving well Anticipatory guidance for next visit - fasting for glucola and TDAP to be given  2. Vaginal itching Used a different soap and began itching.  Used Monistat last night and will finish course Advised testing today may be  compromised.  If still itching after finishing Monistat will repeat swab. - Cervicovaginal ancillary only( [place])  3. H/O cesarean section   4. [redacted] weeks gestation of pregnancy   Preterm labor symptoms and general obstetric precautions including but not limited to vaginal bleeding, contractions, leaking of fluid and fetal movement were reviewed in detail with the patient. Please refer to After Visit Summary for other counseling recommendations.  Return in about 4 weeks (around 10/08/2021) for in person early AM appt for fasting glucola.  Earlie Server, RN, MSN, NP-BC Nurse Practitioner, [facility] for [employer], [facility] 09/10/2021 3:55 PM

## 2021-09-10 NOTE — Progress Notes (Signed)
Patient believes she has a yeast infection. She stated hat she has been having vaginal itching since the beginning of the week

## 2021-09-11 LAB — CERVICOVAGINAL ANCILLARY ONLY
Bacterial Vaginitis (gardnerella): NEGATIVE
Candida Glabrata: NEGATIVE
Candida Vaginitis: NEGATIVE
Comment: NEGATIVE
Comment: NEGATIVE
Comment: NEGATIVE
Comment: NEGATIVE
Trichomonas: NEGATIVE

## 2021-09-12 ENCOUNTER — Encounter: Payer: Self-pay | Admitting: Physical Therapy

## 2021-09-26 ENCOUNTER — Other Ambulatory Visit: Payer: Self-pay

## 2021-09-26 ENCOUNTER — Encounter: Payer: Medicaid Other | Attending: Nurse Practitioner | Admitting: Physical Therapy

## 2021-09-26 ENCOUNTER — Encounter: Payer: Self-pay | Admitting: Physical Therapy

## 2021-09-26 DIAGNOSIS — L905 Scar conditions and fibrosis of skin: Secondary | ICD-10-CM | POA: Insufficient documentation

## 2021-09-26 DIAGNOSIS — R52 Pain, unspecified: Secondary | ICD-10-CM | POA: Insufficient documentation

## 2021-09-26 DIAGNOSIS — R278 Other lack of coordination: Secondary | ICD-10-CM | POA: Diagnosis not present

## 2021-09-26 NOTE — Therapy (Signed)
[place] ?[facility] ?[address], [address] ?[place], [place], [address] ?Phone: [phone]   Fax:  [phone] ? ?Physical Therapy Treatment ? ?Patient Details  ?Name: Julia Armstrong ?MRN: [id number] ?Date of Birth: 17-Nov-1990 ?Referring Provider (PT): Earlie Server, NP ? ? ?Encounter Date: 09/26/2021 ? ? PT End of Session - 09/26/21 1214   ? ? Visit Number 3   ? Date for PT Re-Evaluation 10/24/21   ? Authorization Type UHC medicare   ? Authorization - Visit Number 3   ? Authorization - Number of Visits 27   ? PT Start Time 1130   ? PT Stop Time 1210   ? PT Time Calculation (min) 40 min   ? Activity Tolerance Patient tolerated treatment well;No increased pain   ? Behavior During Therapy [facility] for tasks assessed/performed   ? ?  ?  ? ?  ? ? ?Past Medical History:  ?Diagnosis Date  ? Asthma   ? Drug use 02/26/2018  ? UDS + for cocaine and MJ in 2018, no other UDS done since then UDS done after syncopal episode 02/26/18 showed MJ (pt denies any recent use)  ? Gallstones   ? 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 Missed abortion 05/09/2020  ? Second degree burn of multiple fingers of right hand including thumb 10/09/2016  ? Wound infection after surgery 2016  ? c-section incision had to be reopened and wound vac placed  ? ? ?Past Surgical History:  ?Procedure Laterality Date  ? CESAREAN SECTION    ? C/S x 3  ? CESAREAN SECTION N/A 05/10/2018  ? Procedure: REPEAT CESAREAN SECTION;  Surgeon: Aletha Halim, MD;  Location: [place];  Service: Obstetrics;  Laterality: N/A;  ? DILATION AND EVACUATION N/A 02/18/2017  ? Procedure: DILATATION AND EVACUATION;  Surgeon: Osborne Oman, MD;  Location: [facility];  Service: Gynecology;  Laterality: N/A;  ? DILATION AND EVACUATION N/A 05/09/2020  ? Procedure: DILATATION AND EVACUATION;  Surgeon: Woodroe Mode, MD;  Location: [place];  Service: Gynecology;  Laterality: N/A;  ? WISDOM TOOTH  EXTRACTION    ? ? ?There were no vitals filed for this visit. ? ? Subjective Assessment - 09/26/21 1134   ? ? Subjective The c-section scar feel okay.   ? Patient Stated Goals reduce pain   ? Currently in Pain? No/denies   ? ?  ?  ? ?  ? ? ? ? ? [facility] PT Assessment - 09/26/21 0001   ? ?  ? Assessment  ? Medical Diagnosis Z34.80 supervision of other normal pregnancy, antepartum; O26.891, R10.2 Pelvic pain affecting pregnancy in first trimester, antepartum   ? Referring Provider (PT) Earlie Server, NP   ? Onset Date/Surgical Date --   02/21/2015  ? Prior Therapy none   ?  ? Precautions  ? Precautions Other (comment)   ? Precaution Comments currently pregnant   ?  ? Restrictions  ? Weight Bearing Restrictions No   ?  ? Home Environment  ? Living Environment Private residence   ?  ? Prior Function  ? Level of Independence Independent   ? Leisure none   ?  ? Cognition  ? Overall Cognitive Status Within Functional Limits for tasks assessed   ?  ? Posture/Postural Control  ? Posture/Postural Control Postural limitations   ? Posture Comments pregnant posture   ?  ? AROM  ? Lumbar Extension full   ? ?  ?  ? ?  ? ? ? ? ? ? ? ? ? ? ? ? ?  Pelvic Floor Special Questions - 09/26/21 0001   ? ? Number of Pregnancies 7   currently pregnant and due on 01/01/2022  ? Number of C-Sections 4   ? Currently Sexually Active Yes   ? Is this Painful Yes   ? Urinary Leakage No   ? Fecal incontinence No   ? ?  ?  ? ?  ? ? ? ? [facility] Adult PT Treatment/Exercise - 09/26/21 0001   ? ?  ? Self-Care  ? Self-Care Other Self-Care Comments   ? Other Self-Care Comments  how to take care of c-section wiht massage, how to rub above and below, using oil on the scar after healed, after 6 weeks massage on the scar; lay on back with legs elevated 3 times per day to reduce swelling and use ice pack on scar for 10 minutes, laying on bed flat to elongate the tissue   ?  ? Therapeutic Activites   ? Therapeutic Activities Other Therapeutic Activities   ? Other  Therapeutic Activities educated patient on how to roll in bed, how to go from sit to stand, move in around the house, using a pillow to hold her abdomen when cough or sneeze   ? ?  ?  ? ?  ? ? ? ? ? ? ? ? ? ? PT Education - 09/26/21 1210   ? ? Education Details education on c-section scar care   ? Person(s) Educated Patient   ? Methods Explanation;Demonstration;Handout   ? Comprehension Returned demonstration;Verbalized understanding   ? ?  ?  ? ?  ? ? ? PT Short Term Goals - 09/26/21 1135   ? ?  ? PT SHORT TERM GOAL #1  ? Title independent iwith scar massage to reduce sensitivity   ? Time 4   ? Period Weeks   ? Status Achieved   ? ?  ?  ? ?  ? ? ? ? PT Long Term Goals - 09/26/21 1135   ? ?  ? PT LONG TERM GOAL #1  ? Title C-section scar pain level when touched by clothing decreased </= 2-3/10 compared to 9/10 due to restrictions   ? Time 12   ? Period Weeks   ? Status Achieved   ?  ? PT LONG TERM GOAL #2  ? Title able to have an ultrasound to see how her pregnancy is progressing due to pain decreased >/= 2-3/10   ? Baseline no pain   ? Time 12   ? Period Weeks   ? Status Achieved   ?  ? PT LONG TERM GOAL #3  ? Title Patient is able to extend her spine by 25% more due to decreased restrictions of the c-section   ? Baseline full   ? Time 12   ? Period Weeks   ? Status Achieved   ?  ? PT LONG TERM GOAL #4  ? Title educated on c-section scar management and how to engage her abdominals correctly postpartum to reduce chances of increased pain   ? Time 12   ? Status Achieved   ? ?  ?  ? ?  ? ? ? ? ? ? ? ? Plan - 09/26/21 1133   ? ? Clinical Impression Statement Patient is not having pain with her c-section scar. She has full lumbar ROM. She understands how to take care of the c-section after giving birth. She understand how to protect her c-section after birth with correct body mechanics with home tasks. Patient  understands how to engage her transverse abdominus in different positions. Patient is ready for discharge.   ?  Personal Factors and Comorbidities Comorbidity 3+;Past/Current Experience   ? Comorbidities 4 c-sections   ? Examination-Activity Limitations Dressing   ? Examination-Participation Restrictions Other   hjaving ultrasounds  ? Stability/Clinical Decision Making Stable/Uncomplicated   ? Rehab Potential Excellent   ? PT Treatment/Interventions ADLs/Self Care Home Management;Therapeutic activities;Therapeutic exercise;Neuromuscular re-education;Patient/family education;Manual techniques;Scar mobilization;Taping   ? PT Next Visit Plan Discharge to HEP   ? PT Home Exercise Plan Access Code: [email or web address]   ? Consulted and Agree with Plan of Care Patient   ? ?  ?  ? ?  ? ? ?Patient will benefit from skilled therapeutic intervention in order to improve the following deficits and impairments:  Pain, Decreased scar mobility, Increased fascial restricitons, Decreased mobility, Decreased range of motion ? ?Visit Diagnosis: ?Other lack of coordination ? ?Pain in scar ? ? ? ? ?Problem List ?Patient Active Problem List  ? Diagnosis Date Noted  ? Elevated hemoglobin A1c 08/05/2021  ? Supervision of other normal pregnancy, antepartum 05/21/2021  ? Syncope 02/16/2018  ? Obesity (BMI 35.0-39.9 without comorbidity) 12/01/2017  ? Previous cesarean delivery, antepartum 11/03/2017  ? History of depression 11/03/2017  ? Wound infection after surgery 02/28/2015  ? ? ?Earlie Counts, PT ?09/26/21 12:16 PM ? ? ?[facility] at [facility] ?[address], [address] ?[place], [place], [address] ?Phone: [phone]   Fax:  [phone] ? ?Name: Julia Armstrong ?MRN: [id number] ?Date of Birth: 1991/03/13 ? ?PHYSICAL THERAPY DISCHARGE SUMMARY ? ?Visits from Start of Care: 3 ? ?Current functional level related to goals / functional outcomes: ?See above.  ?  ?Remaining deficits: ?See above.  ?  ?Education / Equipment: ?HEP  ? ?Patient agrees to discharge. Patient goals were met. Patient is being discharged due to meeting the  stated rehab goals. Thank you for the referral. Earlie Counts, PT ?09/26/21 12:17 PM ? ? ? ?

## 2021-10-08 ENCOUNTER — Ambulatory Visit (INDEPENDENT_AMBULATORY_CARE_PROVIDER_SITE_OTHER): Payer: Medicaid Other | Admitting: Obstetrics and Gynecology

## 2021-10-08 ENCOUNTER — Other Ambulatory Visit: Payer: Self-pay

## 2021-10-08 ENCOUNTER — Encounter: Payer: Medicaid Other | Admitting: Medical

## 2021-10-08 ENCOUNTER — Other Ambulatory Visit: Payer: Medicaid Other

## 2021-10-08 ENCOUNTER — Other Ambulatory Visit: Payer: Self-pay | Admitting: General Practice

## 2021-10-08 ENCOUNTER — Encounter: Payer: Self-pay | Admitting: Family Medicine

## 2021-10-08 VITALS — BP 119/65 | HR 85 | Wt 245.0 lb

## 2021-10-08 DIAGNOSIS — Z3A27 27 weeks gestation of pregnancy: Secondary | ICD-10-CM

## 2021-10-08 DIAGNOSIS — O26899 Other specified pregnancy related conditions, unspecified trimester: Secondary | ICD-10-CM

## 2021-10-08 DIAGNOSIS — O99210 Obesity complicating pregnancy, unspecified trimester: Secondary | ICD-10-CM | POA: Insufficient documentation

## 2021-10-08 DIAGNOSIS — Z3480 Encounter for supervision of other normal pregnancy, unspecified trimester: Secondary | ICD-10-CM

## 2021-10-08 DIAGNOSIS — G5600 Carpal tunnel syndrome, unspecified upper limb: Secondary | ICD-10-CM

## 2021-10-08 DIAGNOSIS — Z23 Encounter for immunization: Secondary | ICD-10-CM

## 2021-10-08 DIAGNOSIS — O34219 Maternal care for unspecified type scar from previous cesarean delivery: Secondary | ICD-10-CM

## 2021-10-08 DIAGNOSIS — Z6838 Body mass index (BMI) 38.0-38.9, adult: Secondary | ICD-10-CM

## 2021-10-08 NOTE — Progress Notes (Signed)
Patient reports pain in both of her hands that start roughly 1 week ago. She believes she has carpal tunnel. ? ?TDAP administered into left deltoid without any complications                      ?

## 2021-10-08 NOTE — Progress Notes (Addendum)
? ?  PRENATAL VISIT NOTE ? ?Subjective:  ?Julia Armstrong is a 31 y.o. [id number] at [redacted]w[redacted]d being seen today for ongoing prenatal care.  She is currently monitored for the following issues for this high-risk pregnancy and has Previous cesarean delivery, antepartum; History of depression; Obesity (BMI 35.0-39.9 without comorbidity); Syncope; Wound infection after surgery; Supervision of other normal pregnancy, antepartum; and Elevated hemoglobin A1c on their problem list. ? ?Patient reports  b/l CTS s/s starting a week ago, .  Contractions: Not present. Vag. Bleeding: None.  Movement: Present. Denies leaking of fluid.  ? ?The following portions of the patient's history were reviewed and updated as appropriate: allergies, current medications, past family history, past medical history, past social history, past surgical history and problem list.  ? ?Objective:  ? ?Vitals:  ? 10/08/21 0844  ?BP: 119/65  ?Pulse: 85  ?Weight: 245 lb (111.1 kg)  ? ? ?Fetal Status: Fetal Heart Rate (bpm): 131 Fundal Height: 30 cm Movement: Present    ? ?General:  Alert, oriented and cooperative. Patient is in no acute distress.  ?Skin: Skin is warm and dry. No rash noted.   ?Cardiovascular: Normal heart rate noted  ?Respiratory: Normal respiratory effort, no problems with respiration noted  ?Abdomen: Soft, gravid, appropriate for gestational age.  Pain/Pressure: Absent     ?Pelvic: Cervical exam deferred        ?Extremities: Normal range of motion.  Edema: None  ?Mental Status: Normal mood and affect. Normal behavior. Normal judgment and thought content.  ? ?Assessment and Plan:  ?Pregnancy: [id number] at [redacted]w[redacted]d ?1. [redacted] weeks gestation of pregnancy ?Unsure about birth control but pretty sure no BTL. Options, and BTL papers d/w pt. F/u at subsequent visits ?- Tdap vaccine greater than or equal to 7yo IM ?- Ambulatory referral to Physical Therapy ? ?2. Carpal tunnel syndrome during pregnancy ?- Ambulatory referral to Physical Therapy ? ?3.  Supervision of other normal pregnancy, antepartum ? ?4. Previous cesarean delivery, antepartum ?D/w her re: dates. Pt to look over and let us know next visit and can send to scheduling  ? ?5. BMI 30s ?2/6: 48%, ac 40%. Has repeat growth in April per mfm ? ?Preterm labor symptoms and general obstetric precautions including but not limited to vaginal bleeding, contractions, leaking of fluid and fetal movement were reviewed in detail with the patient. ?Please refer to After Visit Summary for other counseling recommendations.  ? ?Return in about 2 weeks (around 10/22/2021) for low risk ob, in person or virtual, md or app. ? ?Future Appointments  ?Date Time Provider [place]  ?10/08/2021  9:10 AM [facility] LAB [facility]  ?11/11/2021 11:15 AM [facility] NURSE [facility]  ?11/11/2021 11:30 AM [facility] US3 [facility]  ? ? ?Aletha Halim, MD ? ?

## 2021-10-09 LAB — CBC
Hematocrit: 35.4 % (ref 34.0–46.6)
Hemoglobin: 11.5 g/dL (ref 11.1–15.9)
MCH: 24.5 pg — ABNORMAL LOW (ref 26.6–33.0)
MCHC: 32.5 g/dL (ref 31.5–35.7)
MCV: 76 fL — ABNORMAL LOW (ref 79–97)
Platelets: 195 10*3/uL (ref 150–450)
RBC: 4.69 x10E6/uL (ref 3.77–5.28)
RDW: 15.7 % — ABNORMAL HIGH (ref 11.7–15.4)
WBC: 8.5 10*3/uL (ref 3.4–10.8)

## 2021-10-09 LAB — GLUCOSE TOLERANCE, 2 HOURS W/ 1HR
Glucose, 1 hour: 145 mg/dL (ref 70–179)
Glucose, 2 hour: 147 mg/dL (ref 70–152)
Glucose, Fasting: 101 mg/dL — ABNORMAL HIGH (ref 70–91)

## 2021-10-09 LAB — HIV ANTIBODY (ROUTINE TESTING W REFLEX): HIV Screen 4th Generation wRfx: NONREACTIVE

## 2021-10-09 LAB — RPR: RPR Ser Ql: NONREACTIVE

## 2021-10-10 ENCOUNTER — Encounter: Payer: Self-pay | Admitting: Obstetrics and Gynecology

## 2021-10-10 ENCOUNTER — Telehealth: Payer: Self-pay

## 2021-10-10 ENCOUNTER — Other Ambulatory Visit: Payer: Self-pay | Admitting: Obstetrics and Gynecology

## 2021-10-10 DIAGNOSIS — R7303 Prediabetes: Secondary | ICD-10-CM | POA: Insufficient documentation

## 2021-10-10 MED ORDER — ACCU-CHEK GUIDE W/DEVICE KIT: 1.0000 | PACK | Freq: Four times a day (QID) | 0 refills | Status: DC

## 2021-10-10 MED ORDER — GLUCOSE BLOOD VI STRP: ORAL_STRIP | 12 refills | Status: DC

## 2021-10-10 MED ORDER — ACCU-CHEK SOFTCLIX LANCETS MISC: 12 refills | Status: DC

## 2021-10-10 NOTE — Telephone Encounter (Signed)
Call placed to pt. Spoke with pt. Pt given results and recommendations per Dr Vergie Living. Pt verbalized understanding. Pt given instruction on how to check BS 4 times daily. Supplies sent to pharmacy on file. Pt set up with Diabetes ED on 3/28 at 2:15pm.  ? ?Medina Degraffenreid,RN  ?

## 2021-10-10 NOTE — Telephone Encounter (Signed)
-----   Message from [place] Bing, MD sent at 10/10/2021  1:28 PM EDT ----- ?She has diabetes in pregnancy. Please set her up with supplies, classes, etc. thanks ?

## 2021-10-14 ENCOUNTER — Encounter: Payer: Self-pay | Admitting: Obstetrics and Gynecology

## 2021-10-14 ENCOUNTER — Other Ambulatory Visit: Payer: Self-pay | Admitting: *Deleted

## 2021-10-14 DIAGNOSIS — O24419 Gestational diabetes mellitus in pregnancy, unspecified control: Secondary | ICD-10-CM

## 2021-10-16 ENCOUNTER — Other Ambulatory Visit: Payer: Self-pay | Admitting: Lactation Services

## 2021-10-16 MED ORDER — ACCU-CHEK GUIDE VI STRP: ORAL_STRIP | 12 refills | Status: DC

## 2021-10-22 ENCOUNTER — Other Ambulatory Visit: Payer: Medicaid Other

## 2021-10-22 ENCOUNTER — Encounter: Payer: Medicaid Other | Admitting: Family Medicine

## 2021-10-25 ENCOUNTER — Encounter: Payer: Medicaid Other | Admitting: Medical

## 2021-10-31 ENCOUNTER — Inpatient Hospital Stay (HOSPITAL_COMMUNITY)
Admission: AD | Admit: 2021-10-31 | Discharge: 2021-11-01 | Disposition: A | Discharge status: Home / Self Care | Payer: Medicaid Other | Attending: Family Medicine | Admitting: Family Medicine

## 2021-10-31 ENCOUNTER — Other Ambulatory Visit: Payer: Self-pay

## 2021-10-31 ENCOUNTER — Encounter (HOSPITAL_COMMUNITY): Payer: Self-pay | Admitting: Family Medicine

## 2021-10-31 ENCOUNTER — Inpatient Hospital Stay (HOSPITAL_BASED_OUTPATIENT_CLINIC_OR_DEPARTMENT_OTHER): Payer: Medicaid Other

## 2021-10-31 DIAGNOSIS — O34219 Maternal care for unspecified type scar from previous cesarean delivery: Secondary | ICD-10-CM | POA: Insufficient documentation

## 2021-10-31 DIAGNOSIS — O368130 Decreased fetal movements, third trimester, not applicable or unspecified: Secondary | ICD-10-CM | POA: Insufficient documentation

## 2021-10-31 DIAGNOSIS — O26899 Other specified pregnancy related conditions, unspecified trimester: Secondary | ICD-10-CM

## 2021-10-31 DIAGNOSIS — O26893 Other specified pregnancy related conditions, third trimester: Secondary | ICD-10-CM | POA: Diagnosis not present

## 2021-10-31 DIAGNOSIS — O09213 Supervision of pregnancy with history of pre-term labor, third trimester: Secondary | ICD-10-CM | POA: Diagnosis not present

## 2021-10-31 DIAGNOSIS — Z3A31 31 weeks gestation of pregnancy: Secondary | ICD-10-CM

## 2021-10-31 DIAGNOSIS — R102 Pelvic and perineal pain: Secondary | ICD-10-CM | POA: Insufficient documentation

## 2021-10-31 DIAGNOSIS — Z3689 Encounter for other specified antenatal screening: Secondary | ICD-10-CM

## 2021-10-31 DIAGNOSIS — O368190 Decreased fetal movements, unspecified trimester, not applicable or unspecified: Secondary | ICD-10-CM | POA: Diagnosis not present

## 2021-10-31 DIAGNOSIS — O24410 Gestational diabetes mellitus in pregnancy, diet controlled: Secondary | ICD-10-CM

## 2021-10-31 LAB — GLUCOSE, CAPILLARY: Glucose-Capillary: 92 mg/dL (ref 70–99)

## 2021-10-31 LAB — URINALYSIS, ROUTINE W REFLEX MICROSCOPIC
Bilirubin Urine: NEGATIVE
Glucose, UA: NEGATIVE mg/dL
Hgb urine dipstick: NEGATIVE
Ketones, ur: NEGATIVE mg/dL
Leukocytes,Ua: NEGATIVE
Nitrite: NEGATIVE
Protein, ur: NEGATIVE mg/dL
Specific Gravity, Urine: 1.023 (ref 1.005–1.030)
pH: 6 (ref 5.0–8.0)

## 2021-10-31 NOTE — MAU Note (Signed)
Julia Armstrong is a 31 y.o. at [redacted]w[redacted]d here in MAU reporting: "braxton hicks contractions" that started around 0800 when she got to work and "pressure down there" on and off all day. Pt said she got sent home from work from being so uncomfortable. Also reports numbness in her fingers that has been "going on for a while" some days she will have it and some days she won't. States she is lightheaded as well. Reporting recent diagnosis of GDM last week - diet controlled and monitoring CBG at home. Last ate around 1700 (burger and fries). "Just haven't been feeling good all day." Denies VB or LOF. Reports not feeling baby move at all today.  ? ?Onset of complaint: 0800 ?Pain score: 5 - ctx ?Vitals:  ? 10/31/21 2247  ?BP: 128/65  ?Pulse: 79  ?Resp: 17  ?Temp: 98.2 ?F (36.8 ?C)  ?SpO2: 99%  ?   ?FHT:142 ?Lab orders placed from triage: UA  ? ?

## 2021-10-31 NOTE — MAU Provider Note (Signed)
?History  ?  ? ?CSN: [id number] ? ?Arrival date and time: 10/31/21 2239 ? ? Event Date/Time  ? First Provider Initiated Contact with Patient 10/31/21 2306   ?  ? ?Chief Complaint  ?Patient presents with  ? Contractions  ? Dizziness  ? Numbness  ? ?HPI ? ?Ms.Julia Armstrong is a 31 y.o. female [phone] @ 37w1dhere in MAU with complaints of no fetal movement since 0400 and braxton hicks contractions. 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 She feels pressure when she is sitting and standing. The pain comes and goes. She took tylenol which did not help. No bleeding.  ? ?Has had bilateral finger numbness tingling/ carpel tunnel symptoms for over 1 month. Plans to try a brace and see PCP if not improvement.  ? ?She has felt her baby only move 1 times since arrival to MAU.  ? ?OB History   ? ? Gravida  ?8  ? Para  ?4  ? Term  ?4  ? Preterm  ?   ? AB  ?3  ? Living  ?4  ?  ? ? SAB  ?3  ? IAB  ?   ? Ectopic  ?   ? Multiple  ?0  ? Live Births  ?4  ?   ?  ?  ? ? ?Past Medical History:  ?Diagnosis Date  ? Asthma   ? Drug use 02/26/2018  ? UDS + for cocaine and MJ in 2018, no other UDS done since then UDS done after syncopal episode 02/26/18 showed MJ (pt denies any recent use)  ? Gallstones   ? 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 Missed abortion 05/09/2020  ? Second degree burn of multiple fingers of right hand including thumb 10/09/2016  ? Wound infection after surgery 2016  ? c-section incision had to be reopened and wound vac placed  ? ? ?Past Surgical History:  ?Procedure Laterality Date  ? CESAREAN SECTION    ? C/S x 3  ? CESAREAN SECTION N/A 05/10/2018  ? Procedure: REPEAT CESAREAN SECTION;  Surgeon: PAletha Halim MD;  [facility]  Service: Obstetrics;  Laterality: N/A;  ? DILATION AND EVACUATION N/A 02/18/2017  ? Procedure: DILATATION AND EVACUATION;  Surgeon: AOsborne Oman MD;  [facility];  Service: Gynecology;   Laterality: N/A;  ? DILATION AND EVACUATION N/A 05/09/2020  ? Procedure: DILATATION AND EVACUATION;  Surgeon: AWoodroe Mode MD;  [facility]  Service: Gynecology;  Laterality: N/A;  ? WISDOM TOOTH EXTRACTION    ? ? ?Family History  ?Problem Relation Age of Onset  ? Cancer Other   ? Cancer Maternal Grandmother   ?     breast cancer  ? Healthy Mother   ? Healthy Father   ? ? ?Social History  ? ?Tobacco Use  ? Smoking status: Never  ? Smokeless tobacco: Never  ?Vaping Use  ? Vaping Use: Never used  ?Substance Use Topics  ? Alcohol use: Not Currently  ? Drug use: No  ? ? ?Allergies: No Known Allergies ? ?Medications Prior to Admission  ?Medication Sig Dispense Refill Last Dose  ? Accu-Chek Softclix Lancets lancets Use four times daily as instructed. 100 each 12   ? albuterol (VENTOLIN HFA) 108 (90 Base) MCG/ACT inhaler Inhale 2 puffs into the lungs every 6 (six) hours as needed for wheezing or shortness of breath. (Patient not taking: Reported on 08/05/2021)  18 g 0   ? Blood Glucose Monitoring Suppl (ACCU-CHEK GUIDE) w/Device KIT 1 Device by Does not apply route in the morning, at noon, in the evening, and at bedtime. 1 kit 0   ? Blood Pressure Monitoring DEVI 1 each by Does not apply route once a week. (Patient not taking: Reported on 09/10/2021) 1 each 0   ? glucose blood (ACCU-CHEK GUIDE) test strip To check blood sugars  times a day. Fasting and 2 hours after the first bite of breakfast, lunch and dinner. 100 each 12   ? Prenatal Vit-Fe Fumarate-FA (PRENATAL VITAMIN) 27-0.8 MG TABS Take 1 tablet by mouth daily. 30 tablet 11   ? ?Results for orders placed or performed during the hospital encounter of 10/31/21 (from the past 48 hour(s))  ?Urinalysis, Routine w reflex microscopic Urine, Clean Catch     Status: None  ? Collection Time: 10/31/21 10:58 PM  ?Result Value Ref Range  ? Color, Urine YELLOW YELLOW  ? APPearance CLEAR CLEAR  ? Specific Gravity, Urine 1.023 1.005 - 1.030  ? pH 6.0 5.0 -  8.0  ? Glucose, UA NEGATIVE NEGATIVE mg/dL  ? Hgb urine dipstick NEGATIVE NEGATIVE  ? Bilirubin Urine NEGATIVE NEGATIVE  ? Ketones, ur NEGATIVE NEGATIVE mg/dL  ? Protein, ur NEGATIVE NEGATIVE mg/dL  ? Nitrite NEGATIVE NEGATIVE  ? Leukocytes,Ua NEGATIVE NEGATIVE  ?  Comment: Performed at [facility], [place] [address], [place], [place] [address]  ?Glucose, capillary     Status: None  ? Collection Time: 10/31/21 11:26 PM  ?Result Value Ref Range  ? Glucose-Capillary 92 70 - 99 mg/dL  ?  Comment: Glucose reference range applies only to samples taken after fasting for at least 8 hours.  ?  ? ?Review of Systems  ?Gastrointestinal:  Negative for nausea and vomiting.  ?Genitourinary:  Positive for pelvic pain. Negative for vaginal bleeding and vaginal discharge.  ?Physical Exam  ? ?Blood pressure 121/61, pulse 77, temperature 98.2 ?F (36.8 ?C), temperature source Oral, resp. rate 17, last menstrual period 03/01/2021, SpO2 100 %. ? ?Physical Exam ?Vitals and nursing note reviewed.  ?Constitutional:   ?   General: She is not in acute distress. ?   Appearance: Normal appearance. She is not ill-appearing, toxic-appearing or diaphoretic.  ?HENT:  ?   Head: Normocephalic.  ?Abdominal:  ?   Palpations: Abdomen is soft.  ?   Tenderness: There is no abdominal tenderness.  ?Genitourinary: ?   Comments: Cervix closed, thick, long ?Exam by: Noni Saupe, NP  ?Skin: ?   General: Skin is warm.  ?Neurological:  ?   Mental Status: She is alert and oriented to person, place, and time.  ?Psychiatric:     ?   Behavior: Behavior normal.  ? ?Fetal Tracing: ?Baseline: 125 bpm ?Variability: Moderate  ?Accelerations: 15x15 ?Decelerations: None ?Toco:  None ? ?MAU Course  ?Procedures ? ?MDM ? ?BPP 10/10 ?CBG 92 ?Patient now feeling baby move  ? ?Assessment and Plan  ? ?A: ? ?1. Decreased fetal movement during pregnancy, antepartum, single or unspecified fetus   ?2. NST (non-stress test) reactive   ?3. [redacted] weeks gestation of pregnancy   ?4.  Pelvic pressure in pregnancy   ?  ?P ? ?Dc home ?Return to MAU if symptoms worsen ?Recommend pregnancy support belt ?Reviewed fetal kick counts ? ?Lezlie Lye, NP ?11/01/2021 ?12:29 AM ?  ?

## 2021-11-01 DIAGNOSIS — O368190 Decreased fetal movements, unspecified trimester, not applicable or unspecified: Secondary | ICD-10-CM

## 2021-11-01 DIAGNOSIS — Z3A31 31 weeks gestation of pregnancy: Secondary | ICD-10-CM

## 2021-11-07 ENCOUNTER — Ambulatory Visit (INDEPENDENT_AMBULATORY_CARE_PROVIDER_SITE_OTHER): Payer: Medicaid Other | Admitting: Student

## 2021-11-07 ENCOUNTER — Other Ambulatory Visit: Payer: Medicaid Other

## 2021-11-07 VITALS — BP 122/70 | HR 87 | Wt 247.5 lb

## 2021-11-07 DIAGNOSIS — Z3A32 32 weeks gestation of pregnancy: Secondary | ICD-10-CM

## 2021-11-07 DIAGNOSIS — Z3480 Encounter for supervision of other normal pregnancy, unspecified trimester: Secondary | ICD-10-CM

## 2021-11-07 NOTE — Progress Notes (Deleted)
Patient was seen for Gestational Diabetes self-management on ***  ?Start time *** and End time ***  ? ?Estimated due date: ***; ***w***d ? ?Clinical: ?Medications: *** ?Medical History: *** ?Labs: OGTT FBS 101, A1c 5.8% 06/18/22 ? ?Dietary and Lifestyle History: ?*** ? ?Physical Activity: *** ?Stress: *** ?Sleep: *** ? ?24 hr Recall: *** ?First Meal: ?Snack: ?Second meal: ?Snack: ?Third meal: ?Snack: ?Beverages: ? ?NUTRITION INTERVENTION  ?Nutrition education (E-1) on the following topics:  ? ?Initial Follow-up ? ?[]  []  Definition of Gestational Diabetes ?[]  []  Why dietary management is important in controlling blood glucose ?[]  []  Effects each nutrient has on blood glucose levels ?[]  []  Simple carbohydrates vs complex carbohydrates ?[]  []  Fluid intake ?[]  []  Creating a balanced meal plan ?[]  []  Carbohydrate counting  ?[]  []  When to check blood glucose levels ?[]  []  Proper blood glucose monitoring techniques ?[]  []  Effect of stress and stress reduction techniques  ?[]  []  Exercise effect on blood glucose levels, appropriate exercise during pregnancy ?[]  []  Importance of limiting caffeine and abstaining from alcohol and smoking ?[]  []  Medications used for blood sugar control during pregnancy ?[]  []  Hypoglycemia and rule of 15 ?[]  []  Postpartum self care ? ?Blood glucose monitor given: *** ?Lot # *** ?Exp: *** ?CBG: *** mg/dL ? ?*** Patient already has a meter, is *** testing pre breakfast and 2 hours after each meal. ?FBS: *** ?Postprandial: *** ? ?Patient instructed to monitor glucose levels: ?FBS: 60 - ? 95 mg/dL (some clinics use 90 for cutoff) ?1 hour: ? 140 mg/dL ?2 hour: ? mg/dL ? ?Patient received handouts: ?Nutrition Diabetes and Pregnancy ?Carbohydrate Counting List ? ?Patient will be seen for follow-up as needed.  ?

## 2021-11-07 NOTE — Progress Notes (Signed)
Patient showed up late for her appt and was seen at the end of the clinic day ? ? ? ? ?PRENATAL VISIT NOTE ? ?Subjective:  ?Julia Armstrong is a 31 y.o. [id number] at [redacted]w[redacted]d being seen today for ongoing prenatal care.  She is currently monitored for the following issues for this low-risk pregnancy and has Previous cesarean delivery, antepartum; History of depression; BMI 38.0-38.9,adult; Syncope; Wound infection after surgery; Supervision of other normal pregnancy, antepartum; Elevated hemoglobin A1c; Obesity in pregnancy; and GDM (gestational diabetes mellitus) on their problem list. ? ?Patient reports no complaints. She reports a lot of work stress and being tired. SHe works morning shift as a Lawyer.  Contractions: Not present. Vag. Bleeding: None.  Movement: Present. Denies leaking of fluid.  ? ?The following portions of the patient's history were reviewed and updated as appropriate: allergies, current medications, past family history, past medical history, past social history, past surgical history and problem list.  ? ?Objective:  ? ?Vitals:  ? 11/07/21 1622  ?BP: 122/70  ?Pulse: 87  ?Weight: 247 lb 8 oz (112.3 kg)  ? ? ?Fetal Status: Fetal Heart Rate (bpm): 152 Fundal Height: 37 cm Movement: Present    ? ?General:  Alert, oriented and cooperative. Patient is in no acute distress.  ?Skin: Skin is warm and dry. No rash noted.   ?Cardiovascular: Normal heart rate noted  ?Respiratory: Normal respiratory effort, no problems with respiration noted  ?Abdomen: Soft, gravid, appropriate for gestational age.  Pain/Pressure: Present     ?Pelvic: Cervical exam deferred        ?Extremities: Normal range of motion.  Edema: Trace  ?Mental Status: Normal mood and affect. Normal behavior. Normal judgment and thought content.  ? ?Assessment and Plan:  ?Pregnancy: [id number] at [redacted]w[redacted]d ?1. [redacted] weeks gestation of pregnancy   ?2. Supervision of other normal pregnancy, antepartum   ? ?-long discussion about birth control, given that she is  having her 5th c/section, I strongly urged her to consider BTL, she does not want BTL because her sister has had and she is in pain  ?-patient would like IUD, does not want nexplanon (her neighbor has it and has a lot of problems) ?-stressed importance of making DM educator visit; she missed that appt today . Encouraged patient to come to MAU if she is concerned about baby's movements ? ?Preterm labor symptoms and general obstetric precautions including but not limited to vaginal bleeding, contractions, leaking of fluid and fetal movement were reviewed in detail with the patient. ?Please refer to After Visit Summary for other counseling recommendations.  ? ?Return in about 1 week (around 11/14/2021), or ASAP with Marylene Land and then 2 weeks with [facility] MD Only. ? ?Future Appointments  ?Date Time Provider Department Center  ?11/11/2021 11:15 AM [facility] NURSE [facility]  ?11/11/2021 11:30 AM [facility] US3 [facility]  ?11/21/2021  2:15 PM [facility]  ? ? ?Marylene Land, CNM ? ?

## 2021-11-11 ENCOUNTER — Ambulatory Visit: Payer: Medicaid Other | Admitting: *Deleted

## 2021-11-11 ENCOUNTER — Other Ambulatory Visit: Payer: Self-pay | Admitting: Obstetrics and Gynecology

## 2021-11-11 ENCOUNTER — Other Ambulatory Visit: Payer: Self-pay | Admitting: *Deleted

## 2021-11-11 ENCOUNTER — Encounter: Payer: Self-pay | Admitting: *Deleted

## 2021-11-11 ENCOUNTER — Ambulatory Visit: Payer: Medicaid Other | Attending: Obstetrics and Gynecology

## 2021-11-11 ENCOUNTER — Encounter: Payer: Self-pay | Admitting: Student

## 2021-11-11 VITALS — BP 120/68 | HR 78

## 2021-11-11 DIAGNOSIS — Z3A32 32 weeks gestation of pregnancy: Secondary | ICD-10-CM | POA: Insufficient documentation

## 2021-11-11 DIAGNOSIS — O99213 Obesity complicating pregnancy, third trimester: Secondary | ICD-10-CM

## 2021-11-11 DIAGNOSIS — O34219 Maternal care for unspecified type scar from previous cesarean delivery: Secondary | ICD-10-CM

## 2021-11-11 DIAGNOSIS — O24419 Gestational diabetes mellitus in pregnancy, unspecified control: Secondary | ICD-10-CM

## 2021-11-11 DIAGNOSIS — Z3689 Encounter for other specified antenatal screening: Secondary | ICD-10-CM | POA: Insufficient documentation

## 2021-11-11 DIAGNOSIS — E669 Obesity, unspecified: Secondary | ICD-10-CM | POA: Insufficient documentation

## 2021-11-11 DIAGNOSIS — Z3480 Encounter for supervision of other normal pregnancy, unspecified trimester: Secondary | ICD-10-CM | POA: Insufficient documentation

## 2021-11-11 DIAGNOSIS — Z6836 Body mass index (BMI) 36.0-36.9, adult: Secondary | ICD-10-CM

## 2021-11-11 NOTE — Progress Notes (Deleted)
{  Select_TRH_Note:[id number]} 

## 2021-11-11 NOTE — Progress Notes (Signed)
States fetal movement has been normal in previous days, but has felt NO movement yet this AM. ?

## 2021-11-19 ENCOUNTER — Ambulatory Visit: Payer: Medicaid Other | Attending: Obstetrics and Gynecology

## 2021-11-19 ENCOUNTER — Encounter: Payer: Self-pay | Admitting: *Deleted

## 2021-11-19 ENCOUNTER — Ambulatory Visit: Payer: Medicaid Other | Admitting: *Deleted

## 2021-11-19 VITALS — BP 130/66 | HR 75

## 2021-11-19 DIAGNOSIS — Z3480 Encounter for supervision of other normal pregnancy, unspecified trimester: Secondary | ICD-10-CM

## 2021-11-19 DIAGNOSIS — O34219 Maternal care for unspecified type scar from previous cesarean delivery: Secondary | ICD-10-CM | POA: Diagnosis not present

## 2021-11-19 DIAGNOSIS — O99213 Obesity complicating pregnancy, third trimester: Secondary | ICD-10-CM | POA: Diagnosis not present

## 2021-11-19 DIAGNOSIS — O24419 Gestational diabetes mellitus in pregnancy, unspecified control: Secondary | ICD-10-CM | POA: Insufficient documentation

## 2021-11-19 DIAGNOSIS — Z3A33 33 weeks gestation of pregnancy: Secondary | ICD-10-CM | POA: Diagnosis not present

## 2021-11-19 DIAGNOSIS — E669 Obesity, unspecified: Secondary | ICD-10-CM

## 2021-11-21 ENCOUNTER — Ambulatory Visit (INDEPENDENT_AMBULATORY_CARE_PROVIDER_SITE_OTHER): Payer: Medicaid Other | Admitting: Registered"

## 2021-11-21 ENCOUNTER — Encounter: Payer: Medicaid Other | Attending: Obstetrics and Gynecology | Admitting: Registered"

## 2021-11-21 DIAGNOSIS — Z3A00 Weeks of gestation of pregnancy not specified: Secondary | ICD-10-CM | POA: Insufficient documentation

## 2021-11-21 DIAGNOSIS — O24419 Gestational diabetes mellitus in pregnancy, unspecified control: Secondary | ICD-10-CM | POA: Insufficient documentation

## 2021-11-21 DIAGNOSIS — Z713 Dietary counseling and surveillance: Secondary | ICD-10-CM | POA: Insufficient documentation

## 2021-11-21 NOTE — Progress Notes (Signed)
Patient was seen for Gestational Diabetes self-management on 11/21/21  ?Start time 1418 and End time 1500  ? ?Estimated due date: 01/01/22; [redacted]w[redacted]d ? ?Clinical: ?Medications: reviewed ?Medical History: elevated A1c ?Labs: OGTT FBS 101 mg/dL, Z5G 3.8%  ? ?Dietary and Lifestyle History: ?Pt states she is having more difficulty with this pregnancy than she had with her others. Pt states she stopped working last week and is sleeping a lot more now. Pt states her sister works in healthcare and is helping her stay on top of checking her blood sugars. ? ?Pica cravings: Pt reports craving cornstarch, discussed potential GI side effects. Small amounts are not harmful. Pt states she is also craving clay ? ?Physical Activity: ADL ?Stress: not assessed ?Sleep: "a lot" ? ?24 hr Recall: not assessed ? ? ?NUTRITION INTERVENTION  ?Nutrition education (E-1) on the following topics:  ? ?Initial Follow-up ? ?[x]  []  Definition of Gestational Diabetes ?[]  []  Why dietary management is important in controlling blood glucose ?[x]  []  Effects each nutrient has on blood glucose levels ?[]  []  Simple carbohydrates vs complex carbohydrates ?[]  []  Fluid intake ?[x]  []  Creating a balanced meal plan ?[x]  []  Carbohydrate counting  ?[x]  []  When to check blood glucose levels ?[x]  []  Proper blood glucose monitoring techniques ?[x]  []  Effect of stress and stress reduction techniques  ?[x]  []  Exercise effect on blood glucose levels, appropriate exercise during pregnancy ?[x]  []  Importance of limiting caffeine and abstaining from alcohol and smoking ?[x]  []  Medications used for blood sugar control during pregnancy ?[x]  []  Hypoglycemia and rule of 15 ?[x]  []  Postpartum self care ? ?Patient already has a meter, is testing pre breakfast and 1 hour after some meal. Pt reports. ?FBS: 120s ?Postprandial: 107 mg/dL ? ?Patient instructed to monitor glucose levels: ?FBS: 60 - ? 95 mg/dL (some clinics use 90 for cutoff) ?1 hour: ? 140 mg/dL ?2 hour: ?  mg/dL ? ?Patient received handouts: ?Nutrition Diabetes and Pregnancy ?Carbohydrate Counting List ?Dietary sources of Iron, Calcium and basic nutrition guidelines for pregnancy ([place]) ? ?Patient will be seen for follow-up as needed.  ?

## 2021-11-25 ENCOUNTER — Ambulatory Visit (INDEPENDENT_AMBULATORY_CARE_PROVIDER_SITE_OTHER): Payer: Medicaid Other | Admitting: Obstetrics and Gynecology

## 2021-11-25 VITALS — BP 130/74 | HR 90 | Wt 245.5 lb

## 2021-11-25 DIAGNOSIS — Z3480 Encounter for supervision of other normal pregnancy, unspecified trimester: Secondary | ICD-10-CM

## 2021-11-25 DIAGNOSIS — Z3A34 34 weeks gestation of pregnancy: Secondary | ICD-10-CM

## 2021-11-25 DIAGNOSIS — O34219 Maternal care for unspecified type scar from previous cesarean delivery: Secondary | ICD-10-CM

## 2021-11-25 DIAGNOSIS — O24410 Gestational diabetes mellitus in pregnancy, diet controlled: Secondary | ICD-10-CM

## 2021-11-25 NOTE — Progress Notes (Signed)
? ?  PRENATAL VISIT NOTE ? ?Subjective:  ?Julia Armstrong is a 31 y.o. [id number] at [redacted]w[redacted]d being seen today for ongoing prenatal care.  She is currently monitored for the following issues for this high-risk pregnancy and has Previous cesarean delivery, antepartum; History of depression; BMI 38.0-38.9,adult; Syncope; Wound infection after surgery; Supervision of other normal pregnancy, antepartum; Elevated hemoglobin A1c; Obesity in pregnancy; and GDM (gestational diabetes mellitus) on their problem list. ? ?Patient doing well with no acute concerns today. She reports no complaints.  Contractions: Irritability. Vag. Bleeding: None.  Movement: Present. Denies leaking of fluid.  ? ?The following portions of the patient's history were reviewed and updated as appropriate: allergies, current medications, past family history, past medical history, past social history, past surgical history and problem list. Problem list updated. ? ?Objective:  ? ?Vitals:  ? 11/25/21 1044  ?BP: 130/74  ?Pulse: 90  ?Weight: 111.4 kg  ? ? ?Fetal Status: Fetal Heart Rate (bpm): 131 Fundal Height: 38 cm Movement: Present    ? ?General:  Alert, oriented and cooperative. Patient is in no acute distress.  ?Skin: Skin is warm and dry. No rash noted.   ?Cardiovascular: Normal heart rate noted  ?Respiratory: Normal respiratory effort, no problems with respiration noted  ?Abdomen: Soft, gravid, appropriate for gestational age.  Pain/Pressure: Present     ?Pelvic: Cervical exam deferred        ?Extremities: Normal range of motion.  Edema: Trace  ?Mental Status:  Normal mood and affect. Normal behavior. Normal judgment and thought content.  ? ?Assessment and Plan:  ?Pregnancy: [id number] at [redacted]w[redacted]d ? ?1. [redacted] weeks gestation of pregnancy ? ? ?2. Diet controlled gestational diabetes mellitus (GDM) in third trimester ?FBS: 107-127, however, pt did not bring in her values, will recheck in 1 week, if still abnormal, will need to start PM metformin and fetal  testing, Pt given glucose sheets ? ?3. Supervision of other normal pregnancy, antepartum ?Continue routine care ? ?4. Previous cesarean delivery, antepartum ?Pt scheduled for repeat cesarean section, 5 peatr c section ? ?Preterm labor symptoms and general obstetric precautions including but not limited to vaginal bleeding, contractions, leaking of fluid and fetal movement were reviewed in detail with the patient. ? ?Please refer to After Visit Summary for other counseling recommendations.  ? ?Return in about 1 week (around 12/02/2021) for [facility], in person, 36 weeks swabs. ? ? ?Lynnda Shields, MD ?Faculty Attending ?[facility] [place] ?  ?

## 2021-11-26 ENCOUNTER — Ambulatory Visit: Payer: Medicaid Other | Attending: Obstetrics and Gynecology

## 2021-11-26 ENCOUNTER — Ambulatory Visit: Payer: Medicaid Other | Admitting: *Deleted

## 2021-11-26 ENCOUNTER — Other Ambulatory Visit: Payer: Self-pay | Admitting: Obstetrics and Gynecology

## 2021-11-26 VITALS — BP 124/75 | HR 78

## 2021-11-26 DIAGNOSIS — Z3480 Encounter for supervision of other normal pregnancy, unspecified trimester: Secondary | ICD-10-CM

## 2021-11-26 DIAGNOSIS — O24410 Gestational diabetes mellitus in pregnancy, diet controlled: Secondary | ICD-10-CM

## 2021-11-26 DIAGNOSIS — O99213 Obesity complicating pregnancy, third trimester: Secondary | ICD-10-CM | POA: Diagnosis not present

## 2021-11-26 DIAGNOSIS — O24419 Gestational diabetes mellitus in pregnancy, unspecified control: Secondary | ICD-10-CM | POA: Insufficient documentation

## 2021-11-26 DIAGNOSIS — Z6836 Body mass index (BMI) 36.0-36.9, adult: Secondary | ICD-10-CM

## 2021-11-26 DIAGNOSIS — Z9289 Personal history of other medical treatment: Secondary | ICD-10-CM

## 2021-11-26 DIAGNOSIS — O34219 Maternal care for unspecified type scar from previous cesarean delivery: Secondary | ICD-10-CM | POA: Diagnosis not present

## 2021-11-26 DIAGNOSIS — Z3A34 34 weeks gestation of pregnancy: Secondary | ICD-10-CM

## 2021-11-26 DIAGNOSIS — E669 Obesity, unspecified: Secondary | ICD-10-CM

## 2021-11-26 NOTE — Procedures (Signed)
TESSAH PATCHEN ?[birth year] ?[redacted]w[redacted]d ? ?Fetus A Non-Stress Test Interpretation for 11/26/21 ? ?Indication: Unsatisfactory BPP and BMI 36, GDM-diet ? ?Fetal Heart Rate A ?Mode: External ?Baseline Rate (A): 135 bpm ?Variability: Moderate ?Accelerations: 15 x 15 ?Decelerations: None ?Multiple birth?: No ? ?Uterine Activity ?Mode: Palpation, Toco ?Contraction Frequency (min): None ? ?Interpretation (Fetal Testing) ?Nonstress Test Interpretation: Reactive ?Comments: Dr. Parke Poisson reviewed tracing. ? ? ?

## 2021-12-02 ENCOUNTER — Other Ambulatory Visit: Payer: Self-pay | Admitting: *Deleted

## 2021-12-02 ENCOUNTER — Encounter: Payer: Self-pay | Admitting: *Deleted

## 2021-12-02 ENCOUNTER — Ambulatory Visit: Payer: Medicaid Other | Attending: Obstetrics and Gynecology

## 2021-12-02 ENCOUNTER — Ambulatory Visit: Payer: Medicaid Other | Admitting: *Deleted

## 2021-12-02 VITALS — BP 114/55 | HR 78

## 2021-12-02 DIAGNOSIS — E669 Obesity, unspecified: Secondary | ICD-10-CM | POA: Diagnosis not present

## 2021-12-02 DIAGNOSIS — Z362 Encounter for other antenatal screening follow-up: Secondary | ICD-10-CM

## 2021-12-02 DIAGNOSIS — O24410 Gestational diabetes mellitus in pregnancy, diet controlled: Secondary | ICD-10-CM

## 2021-12-02 DIAGNOSIS — O24419 Gestational diabetes mellitus in pregnancy, unspecified control: Secondary | ICD-10-CM | POA: Insufficient documentation

## 2021-12-02 DIAGNOSIS — O34219 Maternal care for unspecified type scar from previous cesarean delivery: Secondary | ICD-10-CM

## 2021-12-02 DIAGNOSIS — Z6836 Body mass index (BMI) 36.0-36.9, adult: Secondary | ICD-10-CM | POA: Insufficient documentation

## 2021-12-02 DIAGNOSIS — O99213 Obesity complicating pregnancy, third trimester: Secondary | ICD-10-CM

## 2021-12-02 DIAGNOSIS — Z3A35 35 weeks gestation of pregnancy: Secondary | ICD-10-CM

## 2021-12-03 ENCOUNTER — Ambulatory Visit (INDEPENDENT_AMBULATORY_CARE_PROVIDER_SITE_OTHER): Payer: Medicaid Other | Admitting: Medical

## 2021-12-03 ENCOUNTER — Encounter: Payer: Self-pay | Admitting: Medical

## 2021-12-03 VITALS — BP 115/71 | HR 80 | Wt 247.1 lb

## 2021-12-03 DIAGNOSIS — O99210 Obesity complicating pregnancy, unspecified trimester: Secondary | ICD-10-CM

## 2021-12-03 DIAGNOSIS — O24415 Gestational diabetes mellitus in pregnancy, controlled by oral hypoglycemic drugs: Secondary | ICD-10-CM

## 2021-12-03 DIAGNOSIS — Z3480 Encounter for supervision of other normal pregnancy, unspecified trimester: Secondary | ICD-10-CM

## 2021-12-03 DIAGNOSIS — G44209 Tension-type headache, unspecified, not intractable: Secondary | ICD-10-CM

## 2021-12-03 DIAGNOSIS — O34219 Maternal care for unspecified type scar from previous cesarean delivery: Secondary | ICD-10-CM

## 2021-12-03 DIAGNOSIS — O24410 Gestational diabetes mellitus in pregnancy, diet controlled: Secondary | ICD-10-CM

## 2021-12-03 DIAGNOSIS — Z3A35 35 weeks gestation of pregnancy: Secondary | ICD-10-CM

## 2021-12-03 MED ORDER — METFORMIN HCL 500 MG PO TABS: 500.0000 mg | ORAL_TABLET | Freq: Two times a day (BID) | ORAL | 1 refills | Status: DC

## 2021-12-03 MED ORDER — CYCLOBENZAPRINE HCL 10 MG PO TABS: 10.0000 mg | ORAL_TABLET | Freq: Two times a day (BID) | ORAL | 0 refills | Status: DC | PRN

## 2021-12-03 NOTE — Progress Notes (Signed)
Patient had elevated PHQ9, declines [place].  ? ?Paulina Fusi, RN ?12/03/21 ?

## 2021-12-04 ENCOUNTER — Other Ambulatory Visit: Payer: Self-pay | Admitting: *Deleted

## 2021-12-04 DIAGNOSIS — O24419 Gestational diabetes mellitus in pregnancy, unspecified control: Secondary | ICD-10-CM

## 2021-12-04 NOTE — Progress Notes (Signed)
? ?  PRENATAL VISIT NOTE ? ?Subjective:  ?Julia Armstrong is a 31 y.o. [id number] at [redacted]w[redacted]d being seen today for ongoing prenatal care.  She is currently monitored for the following issues for this high-risk pregnancy and has Previous cesarean delivery, antepartum; History of depression; BMI 38.0-38.9,adult; Syncope; Wound infection after surgery; Supervision of other normal pregnancy, antepartum; Elevated hemoglobin A1c; Obesity in pregnancy; and GDM (gestational diabetes mellitus) on their problem list. ? ?Patient reports headache.  Contractions: Not present. Vag. Bleeding: None.  Movement: Present. Denies leaking of fluid.  ? ?The following portions of the patient's history were reviewed and updated as appropriate: allergies, current medications, past family history, past medical history, past social history, past surgical history and problem list.  ? ?Objective:  ? ?Vitals:  ? 12/03/21 1019  ?BP: 115/71  ?Pulse: 80  ?Weight: 247 lb 1.6 oz (112.1 kg)  ? ? ?Fetal Status: Fetal Heart Rate (bpm): 140   Movement: Present    ? ?General:  Alert, oriented and cooperative. Patient is in no acute distress.  ?Skin: Skin is warm and dry. No rash noted.   ?Cardiovascular: Normal heart rate noted  ?Respiratory: Normal respiratory effort, no problems with respiration noted  ?Abdomen: Soft, gravid, appropriate for gestational age.  Pain/Pressure: Present     ?Pelvic: Cervical exam deferred        ?Extremities: Normal range of motion.  Edema: Trace  ?Mental Status: Normal mood and affect. Normal behavior. Normal judgment and thought content.  ? ?Assessment and Plan:  ?Pregnancy: [id number] at [redacted]w[redacted]d ?1. Supervision of other normal pregnancy, antepartum ? ?2. Gestational diabetes mellitus (GDM) in third trimester controlled on oral hypoglycemic drug ?- all fasting values and some PP values are elevated ?- discussed with Dr. Crissie Reese who recommend Metformin as noted below ?- metFORMIN (GLUCOPHAGE) 500 MG tablet; Take 1 tablet (500 mg  total) by mouth 2 (two) times daily with a meal.  Dispense: 60 tablet; Refill: 1 ?- BPP yesterday 8/8 ?- BPP and growth [place] scheduled 5/15 ? ?3. Obesity in pregnancy ?- ASA  ? ?4. Previous cesarean delivery, antepartum ?- Fifth repeat scheduled 12/26/21 ? ?5. [redacted] weeks gestation of pregnancy ? ?6. Tension headache ?- cyclobenzaprine (FLEXERIL) 10 MG tablet; Take 1 tablet (10 mg total) by mouth 2 (two) times daily as needed for muscle spasms.  Dispense: 10 tablet; Refill: 0 ?- recommended tylenol, heat and hydrotherapy as well ? ?Preterm labor symptoms and general obstetric precautions including but not limited to vaginal bleeding, contractions, leaking of fluid and fetal movement were reviewed in detail with the patient. ?Please refer to After Visit Summary for other counseling recommendations.  ? ?Return in about 1 week (around 12/10/2021) for [facility] APP, In-Person, any provider. ? ?Future Appointments  ?Date Time Provider Department Center  ?12/09/2021 10:15 AM Warden Fillers, MD [facility]  ?12/09/2021  1:30 PM [facility] NURSE [facility]  ?12/09/2021  1:45 PM [facility] US4 [facility]  ?12/16/2021  9:55 AM Warden Fillers, MD [facility]  ?12/17/2021  7:15 AM [facility] NURSE [facility]  ?12/17/2021  7:30 AM [facility] US2 [facility]  ?12/24/2021  9:35 AM Venora Maples, MD [facility]  ?12/31/2021  9:15 AM Venora Maples, MD [facility]  ? ? ?Vonzella Nipple, PA-C ? ?

## 2021-12-09 ENCOUNTER — Ambulatory Visit: Payer: Medicaid Other | Admitting: *Deleted

## 2021-12-09 ENCOUNTER — Other Ambulatory Visit (HOSPITAL_COMMUNITY)
Admission: RE | Admit: 2021-12-09 | Discharge: 2021-12-09 | Disposition: A | Discharge status: Home / Self Care | Payer: Medicaid Other | Source: Ambulatory Visit | Attending: Obstetrics and Gynecology | Admitting: Obstetrics and Gynecology

## 2021-12-09 ENCOUNTER — Ambulatory Visit: Payer: Medicaid Other | Attending: Obstetrics and Gynecology

## 2021-12-09 ENCOUNTER — Encounter (HOSPITAL_COMMUNITY): Payer: Self-pay

## 2021-12-09 ENCOUNTER — Encounter: Payer: Self-pay | Admitting: *Deleted

## 2021-12-09 ENCOUNTER — Other Ambulatory Visit: Payer: Self-pay | Admitting: Obstetrics and Gynecology

## 2021-12-09 ENCOUNTER — Ambulatory Visit (INDEPENDENT_AMBULATORY_CARE_PROVIDER_SITE_OTHER): Payer: Medicaid Other | Admitting: Obstetrics and Gynecology

## 2021-12-09 VITALS — BP 126/76 | HR 79

## 2021-12-09 VITALS — BP 138/76 | HR 95 | Wt 245.7 lb

## 2021-12-09 DIAGNOSIS — Z3480 Encounter for supervision of other normal pregnancy, unspecified trimester: Secondary | ICD-10-CM | POA: Diagnosis present

## 2021-12-09 DIAGNOSIS — Z3A36 36 weeks gestation of pregnancy: Secondary | ICD-10-CM | POA: Insufficient documentation

## 2021-12-09 DIAGNOSIS — O34219 Maternal care for unspecified type scar from previous cesarean delivery: Secondary | ICD-10-CM

## 2021-12-09 DIAGNOSIS — O24415 Gestational diabetes mellitus in pregnancy, controlled by oral hypoglycemic drugs: Secondary | ICD-10-CM | POA: Diagnosis present

## 2021-12-09 DIAGNOSIS — O99213 Obesity complicating pregnancy, third trimester: Secondary | ICD-10-CM | POA: Insufficient documentation

## 2021-12-09 DIAGNOSIS — E669 Obesity, unspecified: Secondary | ICD-10-CM

## 2021-12-09 DIAGNOSIS — O24419 Gestational diabetes mellitus in pregnancy, unspecified control: Secondary | ICD-10-CM | POA: Insufficient documentation

## 2021-12-09 NOTE — Patient Instructions (Signed)
Julia Armstrong ? 12/09/2021 ? ? Your procedure is scheduled on:  12/11/2021 ? Arrive at [address] at [place] on [address] at [facility]  and [employer] [employer] [address]. You are invited to use the FREE valet parking or use the Visitor's parking deck. ? Pick up the phone at the desk and dial [phone]. ? Call this number if you have problems the morning of surgery: [phone] ? Remember: ? ? Do not eat food:(After Midnight) Desp?s de medianoche. ? Do not drink clear liquids: (After Midnight) Desp?s de medianoche. ? Take these medicines the morning of surgery with A SIP OF WATER:  none ? ? Do not wear jewelry, make-up or nail polish. ? Do not wear lotions, powders, or perfumes. Do not wear deodorant. ? Do not shave 48 hours prior to surgery. ? Do not bring valuables to the hospital.  [facility] is not  ? responsible for any belongings or valuables brought to the hospital. ? Contacts, dentures or bridgework may not be worn into surgery. ? Leave suitcase in the car. After surgery it may be brought to your room. ? For patients admitted to the hospital, checkout time is 11:00 AM the day of  ?            discharge. ? ?   ? Please read over the following fact sheets that you were given:  ?   Preparing for Surgery ? ? ?

## 2021-12-09 NOTE — Progress Notes (Signed)
? ?  PRENATAL VISIT NOTE ? ?Subjective:  ?Julia Armstrong is a 31 y.o. [id number] at [redacted]w[redacted]d being seen today for ongoing prenatal care.  She is currently monitored for the following issues for this high-risk pregnancy and has Previous cesarean delivery, antepartum; History of depression; BMI 38.0-38.9,adult; Syncope; Wound infection after surgery; Supervision of other normal pregnancy, antepartum; Elevated hemoglobin A1c; Obesity in pregnancy; and GDM (gestational diabetes mellitus) on their problem list.  Pt notes mild headache, but has not taken tylenol.  She denies visual changes and RUQ pain. ? ?Patient doing well with no acute concerns today. She reports  mild headache .  Contractions: Irritability. Vag. Bleeding: None.   . Denies leaking of fluid.  ? ?The following portions of the patient's history were reviewed and updated as appropriate: allergies, current medications, past family history, past medical history, past social history, past surgical history and problem list. Problem list updated. ? ?Objective:  ? ?Vitals:  ? 12/09/21 1046  ?BP: 138/76  ?Pulse: 95  ?Weight: 245 lb 11.2 oz (111.4 kg)  ? ? ?Fetal Status: Fetal Heart Rate (bpm): 150 Fundal Height: 39 cm      ? ?General:  Alert, oriented and cooperative. Patient is in no acute distress.  ?Skin: Skin is warm and dry. No rash noted.   ?Cardiovascular: Normal heart rate noted  ?Respiratory: Normal respiratory effort, no problems with respiration noted  ?Abdomen: Soft, gravid, appropriate for gestational age.  Pain/Pressure: Present     ?Pelvic: Cervical exam deferred        ?Extremities: Normal range of motion.     ?Mental Status:  Normal mood and affect. Normal behavior. Normal judgment and thought content.  ? ?Assessment and Plan:  ?Pregnancy: [id number] at [redacted]w[redacted]d ? ?1. Supervision of other normal pregnancy, antepartum ?Swabs taken today ?Pt c section has been moved up due to poor FBS control ?- Culture, beta strep (group b only) ?- Cervicovaginal ancillary  only( [place]) ? ?2. [redacted] weeks gestation of pregnancy ? ? ?3. Gestational diabetes mellitus (GDM) in third trimester controlled on oral hypoglycemic drug ?FBS: 100-148 ?PPBS are normal ? ?4. Previous cesarean delivery, antepartum ?Repeat c section x 5 on 12/11/21, scheduling is aware ? ?Term labor symptoms and general obstetric precautions including but not limited to vaginal bleeding, contractions, leaking of fluid and fetal movement were reviewed in detail with the patient. ? ?Please refer to After Visit Summary for other counseling recommendations.  ? ?No follow-ups on file. ? ? ?Lynnda Shields, MD ?Faculty Attending ?[facility] ?  ?

## 2021-12-10 ENCOUNTER — Encounter (HOSPITAL_COMMUNITY)
Admission: RE | Admit: 2021-12-10 | Discharge: 2021-12-10 | Disposition: A | Discharge status: Home / Self Care | Payer: Medicaid Other | Source: Ambulatory Visit | Attending: Obstetrics & Gynecology | Admitting: Obstetrics & Gynecology

## 2021-12-10 DIAGNOSIS — Z01812 Encounter for preprocedural laboratory examination: Secondary | ICD-10-CM | POA: Insufficient documentation

## 2021-12-10 DIAGNOSIS — O34219 Maternal care for unspecified type scar from previous cesarean delivery: Secondary | ICD-10-CM | POA: Insufficient documentation

## 2021-12-10 HISTORY — DX: Gestational diabetes mellitus in pregnancy, unspecified control: O24.419

## 2021-12-10 LAB — TYPE AND SCREEN
ABO/RH(D): AB POS
Antibody Screen: NEGATIVE

## 2021-12-10 LAB — CBC
HCT: 34.6 % — ABNORMAL LOW (ref 36.0–46.0)
Hemoglobin: 11.3 g/dL — ABNORMAL LOW (ref 12.0–15.0)
MCH: 24.8 pg — ABNORMAL LOW (ref 26.0–34.0)
MCHC: 32.7 g/dL (ref 30.0–36.0)
MCV: 76 fL — ABNORMAL LOW (ref 80.0–100.0)
Platelets: 183 10*3/uL (ref 150–400)
RBC: 4.55 MIL/uL (ref 3.87–5.11)
RDW: 15.6 % — ABNORMAL HIGH (ref 11.5–15.5)
WBC: 7.4 10*3/uL (ref 4.0–10.5)
nRBC: 0 % (ref 0.0–0.2)

## 2021-12-10 LAB — CERVICOVAGINAL ANCILLARY ONLY
Chlamydia: NEGATIVE
Comment: NEGATIVE
Comment: NORMAL
Neisseria Gonorrhea: NEGATIVE

## 2021-12-10 LAB — RPR: RPR Ser Ql: NONREACTIVE

## 2021-12-11 ENCOUNTER — Encounter (HOSPITAL_COMMUNITY): Payer: Self-pay | Admitting: Obstetrics and Gynecology

## 2021-12-11 ENCOUNTER — Inpatient Hospital Stay (HOSPITAL_COMMUNITY): Payer: Medicaid Other | Admitting: Anesthesiology

## 2021-12-11 ENCOUNTER — Encounter (HOSPITAL_COMMUNITY)
Admission: RE | Disposition: A | Discharge status: Home / Self Care | Payer: Self-pay | Source: Ambulatory Visit | Attending: Obstetrics and Gynecology

## 2021-12-11 ENCOUNTER — Other Ambulatory Visit: Payer: Self-pay

## 2021-12-11 ENCOUNTER — Inpatient Hospital Stay (HOSPITAL_COMMUNITY)
Admission: RE | Admit: 2021-12-11 | Discharge: 2021-12-14 | DRG: 787 | Disposition: A | Discharge status: Home / Self Care | Payer: Medicaid Other | Source: Ambulatory Visit | Attending: Obstetrics and Gynecology | Admitting: Obstetrics and Gynecology

## 2021-12-11 DIAGNOSIS — Z30430 Encounter for insertion of intrauterine contraceptive device: Secondary | ICD-10-CM

## 2021-12-11 DIAGNOSIS — O9081 Anemia of the puerperium: Secondary | ICD-10-CM | POA: Diagnosis not present

## 2021-12-11 DIAGNOSIS — D62 Acute posthemorrhagic anemia: Secondary | ICD-10-CM | POA: Diagnosis not present

## 2021-12-11 DIAGNOSIS — Z975 Presence of (intrauterine) contraceptive device: Secondary | ICD-10-CM

## 2021-12-11 DIAGNOSIS — M79605 Pain in left leg: Secondary | ICD-10-CM | POA: Diagnosis not present

## 2021-12-11 DIAGNOSIS — O24419 Gestational diabetes mellitus in pregnancy, unspecified control: Secondary | ICD-10-CM | POA: Diagnosis present

## 2021-12-11 DIAGNOSIS — O24429 Gestational diabetes mellitus in childbirth, unspecified control: Secondary | ICD-10-CM | POA: Diagnosis not present

## 2021-12-11 DIAGNOSIS — M79604 Pain in right leg: Secondary | ICD-10-CM | POA: Diagnosis not present

## 2021-12-11 DIAGNOSIS — J45909 Unspecified asthma, uncomplicated: Secondary | ICD-10-CM | POA: Diagnosis present

## 2021-12-11 DIAGNOSIS — O99214 Obesity complicating childbirth: Secondary | ICD-10-CM | POA: Diagnosis present

## 2021-12-11 DIAGNOSIS — Z98891 History of uterine scar from previous surgery: Secondary | ICD-10-CM

## 2021-12-11 DIAGNOSIS — O9952 Diseases of the respiratory system complicating childbirth: Secondary | ICD-10-CM | POA: Diagnosis present

## 2021-12-11 DIAGNOSIS — O34211 Maternal care for low transverse scar from previous cesarean delivery: Principal | ICD-10-CM | POA: Diagnosis present

## 2021-12-11 DIAGNOSIS — R7303 Prediabetes: Secondary | ICD-10-CM | POA: Diagnosis present

## 2021-12-11 DIAGNOSIS — O24425 Gestational diabetes mellitus in childbirth, controlled by oral hypoglycemic drugs: Secondary | ICD-10-CM | POA: Diagnosis present

## 2021-12-11 DIAGNOSIS — Z3A37 37 weeks gestation of pregnancy: Secondary | ICD-10-CM

## 2021-12-11 DIAGNOSIS — O3663X0 Maternal care for excessive fetal growth, third trimester, not applicable or unspecified: Secondary | ICD-10-CM | POA: Diagnosis present

## 2021-12-11 DIAGNOSIS — O9902 Anemia complicating childbirth: Secondary | ICD-10-CM | POA: Diagnosis not present

## 2021-12-11 DIAGNOSIS — Z3480 Encounter for supervision of other normal pregnancy, unspecified trimester: Secondary | ICD-10-CM

## 2021-12-11 DIAGNOSIS — O99210 Obesity complicating pregnancy, unspecified trimester: Secondary | ICD-10-CM | POA: Diagnosis present

## 2021-12-11 DIAGNOSIS — O34219 Maternal care for unspecified type scar from previous cesarean delivery: Secondary | ICD-10-CM | POA: Diagnosis present

## 2021-12-11 LAB — GLUCOSE, CAPILLARY
Glucose-Capillary: 71 mg/dL (ref 70–99)
Glucose-Capillary: 80 mg/dL (ref 70–99)

## 2021-12-11 SURGERY — Surgical Case
Anesthesia: Spinal

## 2021-12-11 MED ORDER — PRENATAL MULTIVITAMIN CH
1.0000 | ORAL_TABLET | Freq: Every day | ORAL | Status: DC
  Administered 2021-12-12 – 2021-12-14 (×2): 1 via ORAL
  Filled 2021-12-11 (×2): qty 1

## 2021-12-11 MED ORDER — ACETAMINOPHEN 500 MG PO TABS
1000.0000 mg | ORAL_TABLET | Freq: Four times a day (QID) | ORAL | Status: DC
  Administered 2021-12-12 – 2021-12-14 (×9): 1000 mg via ORAL
  Filled 2021-12-11 (×9): qty 2

## 2021-12-11 MED ORDER — SCOPOLAMINE 1 MG/3DAYS TD PT72: 1.0000 | MEDICATED_PATCH | Freq: Once | TRANSDERMAL | Status: DC

## 2021-12-11 MED ORDER — LEVONORGESTREL 20 MCG/DAY IU IUD
1.0000 | INTRAUTERINE_SYSTEM | Freq: Once | INTRAUTERINE | Status: AC
  Administered 2021-12-11: 1 via INTRAUTERINE

## 2021-12-11 MED ORDER — MEPERIDINE HCL 25 MG/ML IJ SOLN: 6.2500 mg | INTRAMUSCULAR | Status: DC | PRN

## 2021-12-11 MED ORDER — FENTANYL CITRATE (PF) 100 MCG/2ML IJ SOLN
INTRAMUSCULAR | Status: DC | PRN
  Administered 2021-12-11: 15 ug via INTRATHECAL

## 2021-12-11 MED ORDER — ONDANSETRON HCL 4 MG/2ML IJ SOLN
INTRAMUSCULAR | Status: AC
  Filled 2021-12-11: qty 2

## 2021-12-11 MED ORDER — TRANEXAMIC ACID-NACL 1000-0.7 MG/100ML-% IV SOLN
INTRAVENOUS | Status: AC
  Filled 2021-12-11: qty 100

## 2021-12-11 MED ORDER — OXYTOCIN-SODIUM CHLORIDE 30-0.9 UT/500ML-% IV SOLN: 2.5000 [IU]/h | INTRAVENOUS | Status: AC

## 2021-12-11 MED ORDER — NALOXONE HCL 4 MG/10ML IJ SOLN: 1.0000 ug/kg/h | INTRAVENOUS | Status: DC | PRN

## 2021-12-11 MED ORDER — ONDANSETRON HCL 4 MG/2ML IJ SOLN: 4.0000 mg | Freq: Three times a day (TID) | INTRAMUSCULAR | Status: DC | PRN

## 2021-12-11 MED ORDER — IBUPROFEN 600 MG PO TABS
600.0000 mg | ORAL_TABLET | Freq: Four times a day (QID) | ORAL | Status: DC
  Administered 2021-12-12 – 2021-12-14 (×10): 600 mg via ORAL
  Filled 2021-12-11 (×10): qty 1

## 2021-12-11 MED ORDER — OXYCODONE HCL 5 MG PO TABS
5.0000 mg | ORAL_TABLET | ORAL | Status: DC | PRN
  Administered 2021-12-12 – 2021-12-14 (×7): 5 mg via ORAL
  Filled 2021-12-11 (×8): qty 1

## 2021-12-11 MED ORDER — ACETAMINOPHEN 10 MG/ML IV SOLN
INTRAVENOUS | Status: DC | PRN
  Administered 2021-12-11: 1000 mg via INTRAVENOUS

## 2021-12-11 MED ORDER — SODIUM CHLORIDE 0.9% FLUSH: 3.0000 mL | INTRAVENOUS | Status: DC | PRN

## 2021-12-11 MED ORDER — LACTATED RINGERS IV SOLN: INTRAVENOUS | Status: DC

## 2021-12-11 MED ORDER — MORPHINE SULFATE (PF) 0.5 MG/ML IJ SOLN
INTRAMUSCULAR | Status: DC | PRN
  Administered 2021-12-11: 150 ug via INTRATHECAL

## 2021-12-11 MED ORDER — OXYTOCIN-SODIUM CHLORIDE 30-0.9 UT/500ML-% IV SOLN
INTRAVENOUS | Status: AC
  Filled 2021-12-11: qty 1000

## 2021-12-11 MED ORDER — KETOROLAC TROMETHAMINE 30 MG/ML IJ SOLN
30.0000 mg | Freq: Four times a day (QID) | INTRAMUSCULAR | Status: AC | PRN
  Administered 2021-12-11: 30 mg via INTRAVENOUS

## 2021-12-11 MED ORDER — MORPHINE SULFATE (PF) 0.5 MG/ML IJ SOLN
INTRAMUSCULAR | Status: AC
  Filled 2021-12-11: qty 10

## 2021-12-11 MED ORDER — WITCH HAZEL-GLYCERIN EX PADS: 1.0000 "application " | MEDICATED_PAD | CUTANEOUS | Status: DC | PRN

## 2021-12-11 MED ORDER — COCONUT OIL OIL: 1.0000 "application " | TOPICAL_OIL | Status: DC | PRN

## 2021-12-11 MED ORDER — DIPHENHYDRAMINE HCL 25 MG PO CAPS: 25.0000 mg | ORAL_CAPSULE | ORAL | Status: DC | PRN

## 2021-12-11 MED ORDER — OXYCODONE HCL 5 MG/5ML PO SOLN: 5.0000 mg | Freq: Once | ORAL | Status: DC | PRN

## 2021-12-11 MED ORDER — SIMETHICONE 80 MG PO CHEW
80.0000 mg | CHEWABLE_TABLET | Freq: Three times a day (TID) | ORAL | Status: DC
  Administered 2021-12-12 – 2021-12-14 (×6): 80 mg via ORAL
  Filled 2021-12-11 (×6): qty 1

## 2021-12-11 MED ORDER — ACETAMINOPHEN 160 MG/5ML PO SOLN: 325.0000 mg | ORAL | Status: DC | PRN

## 2021-12-11 MED ORDER — CEFAZOLIN IN SODIUM CHLORIDE 3-0.9 GM/100ML-% IV SOLN
INTRAVENOUS | Status: AC
  Filled 2021-12-11: qty 100

## 2021-12-11 MED ORDER — SIMETHICONE 80 MG PO CHEW
80.0000 mg | CHEWABLE_TABLET | ORAL | Status: DC | PRN
  Administered 2021-12-13: 80 mg via ORAL
  Filled 2021-12-11: qty 1

## 2021-12-11 MED ORDER — SOD CITRATE-CITRIC ACID 500-334 MG/5ML PO SOLN
30.0000 mL | Freq: Once | ORAL | Status: AC
  Administered 2021-12-11: 30 mL via ORAL

## 2021-12-11 MED ORDER — ENOXAPARIN SODIUM 60 MG/0.6ML IJ SOSY
60.0000 mg | PREFILLED_SYRINGE | INTRAMUSCULAR | Status: DC
  Administered 2021-12-12 – 2021-12-14 (×3): 60 mg via SUBCUTANEOUS
  Filled 2021-12-11 (×3): qty 0.6

## 2021-12-11 MED ORDER — ACETAMINOPHEN 10 MG/ML IV SOLN
INTRAVENOUS | Status: AC
  Filled 2021-12-11: qty 100

## 2021-12-11 MED ORDER — DIPHENHYDRAMINE HCL 25 MG PO CAPS: 25.0000 mg | ORAL_CAPSULE | Freq: Four times a day (QID) | ORAL | Status: DC | PRN

## 2021-12-11 MED ORDER — DIBUCAINE (PERIANAL) 1 % EX OINT: 1.0000 "application " | TOPICAL_OINTMENT | CUTANEOUS | Status: DC | PRN

## 2021-12-11 MED ORDER — MENTHOL 3 MG MT LOZG: 1.0000 | LOZENGE | OROMUCOSAL | Status: DC | PRN

## 2021-12-11 MED ORDER — BUPIVACAINE IN DEXTROSE 0.75-8.25 % IT SOLN
INTRATHECAL | Status: DC | PRN
  Administered 2021-12-11: 1.6 mL via INTRATHECAL

## 2021-12-11 MED ORDER — TRANEXAMIC ACID-NACL 1000-0.7 MG/100ML-% IV SOLN
INTRAVENOUS | Status: DC | PRN
  Administered 2021-12-11: 1000 mg via INTRAVENOUS

## 2021-12-11 MED ORDER — DEXAMETHASONE SODIUM PHOSPHATE 4 MG/ML IJ SOLN
INTRAMUSCULAR | Status: AC
  Filled 2021-12-11: qty 1

## 2021-12-11 MED ORDER — ONDANSETRON HCL 4 MG/2ML IJ SOLN: 4.0000 mg | Freq: Once | INTRAMUSCULAR | Status: DC | PRN

## 2021-12-11 MED ORDER — ZOLPIDEM TARTRATE 5 MG PO TABS: 5.0000 mg | ORAL_TABLET | Freq: Every evening | ORAL | Status: DC | PRN

## 2021-12-11 MED ORDER — ACETAMINOPHEN 325 MG PO TABS: 325.0000 mg | ORAL_TABLET | ORAL | Status: DC | PRN

## 2021-12-11 MED ORDER — PHENYLEPHRINE HCL-NACL 20-0.9 MG/250ML-% IV SOLN
INTRAVENOUS | Status: DC | PRN
  Administered 2021-12-11: 60 ug/min via INTRAVENOUS

## 2021-12-11 MED ORDER — OXYTOCIN-SODIUM CHLORIDE 30-0.9 UT/500ML-% IV SOLN
INTRAVENOUS | Status: DC | PRN
Start: 2021-12-11 — End: 2021-12-11
  Administered 2021-12-11: 300 mL via INTRAVENOUS

## 2021-12-11 MED ORDER — DEXAMETHASONE SODIUM PHOSPHATE 4 MG/ML IJ SOLN
INTRAMUSCULAR | Status: AC
  Filled 2021-12-11: qty 2

## 2021-12-11 MED ORDER — PHENYLEPHRINE HCL (PRESSORS) 10 MG/ML IV SOLN
INTRAVENOUS | Status: DC | PRN
  Administered 2021-12-11: 160 ug via INTRAVENOUS

## 2021-12-11 MED ORDER — KETOROLAC TROMETHAMINE 30 MG/ML IJ SOLN: 30.0000 mg | Freq: Four times a day (QID) | INTRAMUSCULAR | Status: AC | PRN

## 2021-12-11 MED ORDER — KETOROLAC TROMETHAMINE 30 MG/ML IJ SOLN
INTRAMUSCULAR | Status: AC
  Filled 2021-12-11: qty 1

## 2021-12-11 MED ORDER — NALOXONE HCL 0.4 MG/ML IJ SOLN: 0.4000 mg | INTRAMUSCULAR | Status: DC | PRN

## 2021-12-11 MED ORDER — OXYCODONE HCL 5 MG PO TABS: 5.0000 mg | ORAL_TABLET | Freq: Once | ORAL | Status: DC | PRN

## 2021-12-11 MED ORDER — FENTANYL CITRATE (PF) 100 MCG/2ML IJ SOLN
INTRAMUSCULAR | Status: AC
  Filled 2021-12-11: qty 2

## 2021-12-11 MED ORDER — MEASLES, MUMPS & RUBELLA VAC IJ SOLR: 0.5000 mL | Freq: Once | INTRAMUSCULAR | Status: DC

## 2021-12-11 MED ORDER — SENNOSIDES-DOCUSATE SODIUM 8.6-50 MG PO TABS
2.0000 | ORAL_TABLET | ORAL | Status: DC
  Administered 2021-12-12 – 2021-12-14 (×4): 2 via ORAL
  Filled 2021-12-11 (×5): qty 2

## 2021-12-11 MED ORDER — CEFAZOLIN IN SODIUM CHLORIDE 3-0.9 GM/100ML-% IV SOLN: 3.0000 g | INTRAVENOUS | Status: DC

## 2021-12-11 MED ORDER — PHENYLEPHRINE HCL-NACL 20-0.9 MG/250ML-% IV SOLN
INTRAVENOUS | Status: AC
  Filled 2021-12-11: qty 250

## 2021-12-11 MED ORDER — POVIDONE-IODINE 10 % EX SWAB
2.0000 "application " | Freq: Once | CUTANEOUS | Status: AC
  Administered 2021-12-11: 2 via TOPICAL

## 2021-12-11 MED ORDER — STERILE WATER FOR IRRIGATION IR SOLN
Status: DC | PRN
  Administered 2021-12-11: 1

## 2021-12-11 MED ORDER — LEVONORGESTREL 20 MCG/DAY IU IUD
INTRAUTERINE_SYSTEM | INTRAUTERINE | Status: AC
  Filled 2021-12-11: qty 1

## 2021-12-11 MED ORDER — SODIUM CHLORIDE 0.9 % IR SOLN
Status: DC | PRN
  Administered 2021-12-11: 1000 mL

## 2021-12-11 MED ORDER — OXYTOCIN-SODIUM CHLORIDE 30-0.9 UT/500ML-% IV SOLN
INTRAVENOUS | Status: AC
  Filled 2021-12-11: qty 500

## 2021-12-11 MED ORDER — CEFAZOLIN SODIUM-DEXTROSE 2-4 GM/100ML-% IV SOLN
2.0000 g | Freq: Once | INTRAVENOUS | Status: AC
Start: 2021-12-11 — End: 2021-12-11
  Administered 2021-12-11: 2 g via INTRAVENOUS

## 2021-12-11 MED ORDER — FENTANYL CITRATE (PF) 100 MCG/2ML IJ SOLN: 25.0000 ug | INTRAMUSCULAR | Status: DC | PRN

## 2021-12-11 MED ORDER — DEXAMETHASONE SODIUM PHOSPHATE 4 MG/ML IJ SOLN
INTRAMUSCULAR | Status: DC | PRN
  Administered 2021-12-11: 8 mg via INTRAVENOUS

## 2021-12-11 MED ORDER — DIPHENHYDRAMINE HCL 50 MG/ML IJ SOLN: 12.5000 mg | INTRAMUSCULAR | Status: DC | PRN

## 2021-12-11 MED ORDER — ONDANSETRON HCL 4 MG/2ML IJ SOLN
INTRAMUSCULAR | Status: DC | PRN
  Administered 2021-12-11: 4 mg via INTRAVENOUS

## 2021-12-11 SURGICAL SUPPLY — 42 items
APL SKNCLS STERI-STRIP NONHPOA (GAUZE/BANDAGES/DRESSINGS) ×1
BENZOIN TINCTURE PRP APPL 2/3 (GAUZE/BANDAGES/DRESSINGS) ×2 IMPLANT
CANISTER PREVENA PLUS 150 (CANNISTER) ×1 IMPLANT
CHLORAPREP W/TINT 26ML (MISCELLANEOUS) ×4 IMPLANT
CLAMP CORD UMBIL (MISCELLANEOUS) ×2 IMPLANT
CLOTH BEACON ORANGE TIMEOUT ST (SAFETY) ×2 IMPLANT
DRESSING PREVENA PLUS CUSTOM (GAUZE/BANDAGES/DRESSINGS) IMPLANT
DRSG OPSITE POSTOP 4X10 (GAUZE/BANDAGES/DRESSINGS) ×2 IMPLANT
DRSG PREVENA PLUS CUSTOM (GAUZE/BANDAGES/DRESSINGS) ×2
ELECT REM PT RETURN 9FT ADLT (ELECTROSURGICAL) ×2
ELECTRODE REM PT RTRN 9FT ADLT (ELECTROSURGICAL) ×1 IMPLANT
EXTRACTOR VACUUM KIWI (MISCELLANEOUS) ×1 IMPLANT
EXTRACTOR VACUUM M CUP 4 TUBE (SUCTIONS) IMPLANT
GLOVE BIOGEL PI IND STRL 7.0 (GLOVE) ×2 IMPLANT
GLOVE BIOGEL PI IND STRL 7.5 (GLOVE) ×2 IMPLANT
GLOVE BIOGEL PI INDICATOR 7.0 (GLOVE) ×2
GLOVE BIOGEL PI INDICATOR 7.5 (GLOVE) ×2
GLOVE ECLIPSE 7.5 STRL STRAW (GLOVE) ×2 IMPLANT
GOWN STRL REUS W/TWL LRG LVL3 (GOWN DISPOSABLE) ×6 IMPLANT
KIT ABG SYR 3ML LUER SLIP (SYRINGE) IMPLANT
MAT PREVALON FULL STRYKER (MISCELLANEOUS) ×1 IMPLANT
NDL HYPO 25X5/8 SAFETYGLIDE (NEEDLE) IMPLANT
NEEDLE HYPO 25X5/8 SAFETYGLIDE (NEEDLE) IMPLANT
NS IRRIG 1000ML POUR BTL (IV SOLUTION) ×2 IMPLANT
PACK C SECTION WH (CUSTOM PROCEDURE TRAY) ×2 IMPLANT
PAD OB MATERNITY 4.3X12.25 (PERSONAL CARE ITEMS) ×2 IMPLANT
RTRCTR C-SECT PINK 25CM LRG (MISCELLANEOUS) ×2 IMPLANT
RTRCTR WOUND ALEXIS 18CM SML (INSTRUMENTS) ×2
SAVER CELL AAL HAEMONETICS (INSTRUMENTS) IMPLANT
STRIP CLOSURE SKIN 1/2X4 (GAUZE/BANDAGES/DRESSINGS) ×2 IMPLANT
SUT PLAIN 0 NONE (SUTURE) ×2 IMPLANT
SUT PLAIN 2 0 (SUTURE) ×2
SUT PLAIN ABS 2-0 CT1 27XMFL (SUTURE) IMPLANT
SUT VIC AB 0 CT1 27 (SUTURE) ×2
SUT VIC AB 0 CT1 27XBRD ANBCTR (SUTURE) IMPLANT
SUT VIC AB 0 CT1 36 (SUTURE) ×6 IMPLANT
SUT VIC AB 2-0 CT1 27 (SUTURE) ×2
SUT VIC AB 2-0 CT1 TAPERPNT 27 (SUTURE) ×1 IMPLANT
SUT VIC AB 4-0 KS 27 (SUTURE) ×2 IMPLANT
TOWEL OR 17X24 6PK STRL BLUE (TOWEL DISPOSABLE) ×2 IMPLANT
TRAY FOLEY W/BAG SLVR 14FR LF (SET/KITS/TRAYS/PACK) ×2 IMPLANT
WATER STERILE IRR 1000ML POUR (IV SOLUTION) ×2 IMPLANT

## 2021-12-11 NOTE — H&P (Addendum)
LABOR AND DELIVERY ADMISSION HISTORY AND PHYSICAL NOTE ? ?Julia Armstrong is a 31 y.o. female [phone] with IUP at [redacted]w[redacted]d by 8 wk u/s presenting for scheduled repeat cesarean section at this gestational age due to uncontrolled A2GDM.  ? ?She reports positive fetal movement. She denies leakage of fluid or vaginal bleeding. ? ?Prenatal History/Complications: ? ?Past Medical History: ?Past Medical History:  ?Diagnosis Date  ? Asthma   ? Drug use 02/26/2018  ? UDS + for cocaine and MJ in 2018, no other UDS done since then UDS done after syncopal episode 02/26/18 showed MJ (pt denies any recent use)  ? Gallstones   ? Gestational diabetes   ? 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 Missed abortion 05/09/2020  ? Second degree burn of multiple fingers of right hand including thumb 10/09/2016  ? Wound infection after surgery 2016  ? c-section incision had to be reopened and wound vac placed  ? ? ?Past Surgical History: ?Past Surgical History:  ?Procedure Laterality Date  ? CESAREAN SECTION    ? C/S x 3  ? CESAREAN SECTION N/A 05/10/2018  ? Procedure: REPEAT CESAREAN SECTION;  Surgeon: Aletha Halim, MD;  Location: [place];  Service: Obstetrics;  Laterality: N/A;  ? DILATION AND EVACUATION N/A 02/18/2017  ? Procedure: DILATATION AND EVACUATION;  Surgeon: Osborne Oman, MD;  Location: [facility];  Service: Gynecology;  Laterality: N/A;  ? DILATION AND EVACUATION N/A 05/09/2020  ? Procedure: DILATATION AND EVACUATION;  Surgeon: Woodroe Mode, MD;  Location: [place];  Service: Gynecology;  Laterality: N/A;  ? WISDOM TOOTH EXTRACTION    ? ? ?Obstetrical History: ?OB History   ? ? Gravida  ?8  ? Para  ?4  ? Term  ?4  ? Preterm  ?   ? AB  ?3  ? Living  ?4  ?  ? ? SAB  ?3  ? IAB  ?   ? Ectopic  ?   ? Multiple  ?0  ? Live Births  ?4  ?   ?  ?  ? ? ?Social History: ?Social History  ? ?Socioeconomic History  ? Marital status: Significant Other  ?  Spouse name: Not on file  ? Number of  children: Not on file  ? Years of education: Not on file  ? Highest education level: Not on file  ?Occupational History  ? Not on file  ?Tobacco Use  ? Smoking status: Never  ? Smokeless tobacco: Never  ?Vaping Use  ? Vaping Use: Never used  ?Substance and Sexual Activity  ? Alcohol use: Not Currently  ? Drug use: No  ? Sexual activity: Yes  ?  Birth control/protection: None  ?Other Topics Concern  ? Not on file  ?Social History Narrative  ? Not on file  ? ?Social Determinants of Health  ? ?Financial Resource Strain: Not on file  ?Food Insecurity: No Food Insecurity  ? Worried About Charity fundraiser in the Last Year: Never true  ? Ran Out of Food in the Last Year: Never true  ?Transportation Needs: No Transportation Needs  ? Lack of Transportation (Medical): No  ? Lack of Transportation (Non-Medical): No  ?Physical Activity: Not on file  ?Stress: Not on file  ?Social Connections: Not on file  ? ? ?Family History: ?Family History  ?Problem Relation Age of Onset  ? Cancer Other   ? Cancer Maternal Grandmother   ?     breast cancer  ?  Healthy Mother   ? Healthy Father   ? ? ?Allergies: ?No Known Allergies ? ?Medications Prior to Admission  ?Medication Sig Dispense Refill Last Dose  ? albuterol (VENTOLIN HFA) 108 (90 Base) MCG/ACT inhaler Inhale 2 puffs into the lungs every 6 (six) hours as needed for wheezing or shortness of breath. 18 g 0 Past Month  ? metFORMIN (GLUCOPHAGE) 500 MG tablet Take 1 tablet (500 mg total) by mouth 2 (two) times daily with a meal. 60 tablet 1 Past Week  ? Prenatal Vit-Fe Fumarate-FA (PRENATAL VITAMIN) 27-0.8 MG TABS Take 1 tablet by mouth daily. 30 tablet 11 Past Week  ? Accu-Chek Softclix Lancets lancets Use four times daily as instructed. 100 each 12 Unknown  ? Blood Glucose Monitoring Suppl (ACCU-CHEK GUIDE) w/Device KIT 1 Device by Does not apply route in the morning, at noon, in the evening, and at bedtime. 1 kit 0 Unknown  ? Blood Pressure Monitoring DEVI 1 each by Does not apply  route once a week. (Patient not taking: Reported on 09/10/2021) 1 each 0   ? cyclobenzaprine (FLEXERIL) 10 MG tablet Take 1 tablet (10 mg total) by mouth 2 (two) times daily as needed for muscle spasms. (Patient not taking: Reported on 12/09/2021) 10 tablet 0 Unknown  ? glucose blood (ACCU-CHEK GUIDE) test strip To check blood sugars  times a day. Fasting and 2 hours after the first bite of breakfast, lunch and dinner. 100 each 12 Unknown  ? ? ? ?Review of Systems  ? ?All systems reviewed and negative except as stated in HPI ? ?Last menstrual period 03/01/2021. ?General appearance: alert, cooperative, and appears stated age ?Lungs: clear to auscultation bilaterally ?Heart: regular rate and rhythm ?Abdomen: soft, non-tender; bowel sounds normal ?Extremities: No calf swelling or tenderness ?Presentation: cephalic ?Fetal monitoring: 130s ?Uterine activity: quiet ?  ? ? ?Prenatal labs: ?ABO, Rh: --/--/AB POS (05/16 1004) ?Antibody: NEG (05/16 1004) ?Rubella: 4.22 (11/07 1637) ?RPR: NON REACTIVE (05/16 1007)  ?HBsAg: Negative (11/07 1637)  ?HIV: Non Reactive (03/14 0959)  ?GBS:   pending ?2 hr Glucola: abnormal ?Genetic screening:  low-risk NIPS ?Anatomy US: wnl ? ?Prenatal Transfer Tool  ?Maternal Diabetes: Yes:  Diabetes Type:  Insulin/Medication controlled ?Genetic Screening: Normal ?Maternal Ultrasounds/Referrals: Normal ?Fetal Ultrasounds or other Referrals:  None ?Maternal Substance Abuse:  No ?Significant Maternal Medications:  metformin ?Significant Maternal Lab Results: None ? ?Results for orders placed or performed during the hospital encounter of 12/11/21 (from the past 24 hour(s))  ?Glucose, capillary  ? Collection Time: 12/11/21 11:22 AM  ?Result Value Ref Range  ? Glucose-Capillary 71 70 - 99 mg/dL  ? ? ?Patient Active Problem List  ? Diagnosis Date Noted  ? GDM (gestational diabetes mellitus) 10/10/2021  ? Obesity in pregnancy 10/08/2021  ? Elevated hemoglobin A1c 08/05/2021  ? Supervision of other normal  pregnancy, antepartum 05/21/2021  ? Syncope 02/16/2018  ? BMI 38.0-38.9,adult 12/01/2017  ? Previous cesarean delivery, antepartum 11/03/2017  ? History of depression 11/03/2017  ? Wound infection after surgery 02/28/2015  ? ? ?Assessment: ?KYNSIE FALKNER is a 31 y.o. [id number] at [redacted]w[redacted]d here for scheduled repeat cesarean section. ? ?The risks of cesarean section were discussed with the patient including but were not limited to: bleeding which may require transfusion or reoperation; infection which may require antibiotics; injury to bowel, bladder, ureters or other surrounding organs; injury to the fetus; need for additional procedures including hysterectomy in the event of a life-threatening hemorrhage; placental abnormalities wth subsequent pregnancies, incisional problems,  thromboembolic phenomenon and other postoperative/anesthesia complications.  The patient concurred with the proposed plan, giving informed written consent for the procedures.  Patient has been NPO since midnight she will remain NPO for procedure. Anesthesia and OR aware.  Preoperative prophylactic antibiotics and SCDs ordered on call to the OR.  To OR when ready. ? ?#A2GDM: fastings above goal, performing c/s at this gestational age secondary to this poor control. Fasting glucose this morning wnl (71). Will plan on post-partum fasting glucose monitoring. ? ?#LGA: noted that EFW is 98th percentile ? ?#Circ: yes, inpatient ?Preprocedural Counseling: Parent desires circumcision for this female infant.  Circumcision procedure details discussed, risks and benefits of procedure were also discussed.  The benefits include but are not limited to: reduction in the rates of urinary tract infection (UTI), penile cancer, sexually transmitted infections including HIV, penile inflammatory and retractile disorders.   Risks include but are not limited to: bleeding, infection, injury of glans which may lead to penile deformity or urinary tract issues or Urology  intervention, unsatisfactory cosmetic appearance and other potential complications related to the procedure.  It was emphasized that this is an elective procedure.   ? ?#Contraception: post-placental lng

## 2021-12-11 NOTE — Transfer of Care (Signed)
Immediate Anesthesia Transfer of Care Note ? ?Patient: Julia Armstrong ? ?Procedure(s) Performed: CESAREAN SECTION ? ?Patient Location: PACU ? ?Anesthesia Type:Spinal ? ?Level of Consciousness: awake, alert  and oriented ? ?Airway & Oxygen Therapy: Patient Spontanous Breathing ? ?Post-op Assessment: Report given to RN and Post -op Vital signs reviewed and stable ? ?Post vital signs: Reviewed and stable ? ?Last Vitals:  ?Vitals Value Taken Time  ?BP 109/63 12/11/21 1439  ?Temp    ?Pulse 68 12/11/21 1442  ?Resp 20 12/11/21 1442  ?SpO2 97 % 12/11/21 1442  ?Vitals shown include unvalidated device data. ? ?Last Pain: There were no vitals filed for this visit.   ? ?  ? ?Complications: No notable events documented. ?

## 2021-12-11 NOTE — Anesthesia Procedure Notes (Signed)
Spinal ? ?Patient location during procedure: OR ?Start time: 12/11/2021 12:50 PM ?End time: 12/11/2021 1:00 PM ?Reason for block: surgical anesthesia ?Staffing ?Anesthesiologist: Bethena Midget, MD ?Preanesthetic Checklist ?Completed: patient identified, IV checked, site marked, risks and benefits discussed, surgical consent, monitors and equipment checked, pre-op evaluation and timeout performed ?Spinal Block ?Patient position: sitting ?Prep: DuraPrep ?Patient monitoring: heart rate, cardiac monitor, continuous pulse ox and blood pressure ?Approach: midline ?Location: L3-4 ?Injection technique: single-shot ?Needle ?Needle type: Sprotte  ?Needle gauge: 24 G ?Needle length: 9 cm ?Assessment ?Sensory level: T4 ?Events: CSF return ? ? ? ?

## 2021-12-11 NOTE — Op Note (Signed)
Cesarean Section Operative Report ? ?Julia Armstrong ? ?12/11/2021 ? ?Indications: history prior cesarean section (x4), undesired fertility ? ?Pre-operative Diagnosis: repeat low transverse cesarean section, placement of post-placental lng-iud (Mirena)  ? ?Post-operative Diagnosis: Same  ? ?Surgeon: Surgeon(s) and Role: ?   * Mattye Verdone, Ailene Rud, MD - Assisting ?   Florian Buff, MD - Primary ?   Patriciaann Clan, DO - Fellow  ? ?Attending Attestation: I was present and scrubbed for the key portions of the procedure.  ? ?An experienced assistant was required given the standard of surgical care given the complexity of the case.  This assistant was needed for exposure, dissection, suctioning, retraction, instrument exchange, assisting with delivery with administration of fundal pressure, and for overall help during the procedure. ? ?Anesthesia: spinal  ?  ?Estimated Blood Loss: 428 ml ? ?Total IV Fluids: 1300 ml LR ? ?Urine Output:: 350 ml clear yellow urine ? ?Specimens: none ? ?Findings: Viable female infant in cephalic presentation; Apgars pending; weight pending; arterial cord pH not obtained;  clear amniotic fluid; intact placenta with three vessel cord; normal uterus, fallopian tubes and ovaries bilaterally. ? ?Baby condition / location:  Couplet care / Skin to Skin  ? ?Complications: no complications ? ?Indications: ?Julia Armstrong is a 31 y.o. [phone] with an IUP [redacted]w[redacted]d presenting for repeat cesarean section at this gestational age for uncontrolled A2GDM. ? ?The risks, benefits, complications, treatment options, and exected outcomes were discussed with the patient . The patient dwith the proposed plan, giving informed consent. identified as Julia Armstrong and the procedure verified as C-Section Delivery. ? ?Procedure Details:  ?The patient was taken back to the operative suite where spinal anesthesia was placed. ? ?A time out was held and the above information confirmed.  ? ?After induction of  anesthesia, the patient was draped and prepped in the usual sterile manner and placed in a dorsal supine position with a leftward tilt. A Pfannenstiel incision was made and carried down through the subcutaneous tissue to the fascia. Fascial incision was made and sharply extended transversely. The fascia was separated from the underlying rectus tissue superiorly and inferiorly. The peritoneum was identified and bluntly entered and extended longitudinally. A thick band of omentum was encountered overlying the uterus. Two kelly forceps were used to grasp the omentum and this was divided with scissors. Each free end of the omentum was suture ligated with 2-0 vicryl and homostasis was observed. Alexis retractor was placed. A bladder flap was not created. A low transverse uterine incision was made and extended bluntly. Fetal extraction proved difficult so soft vacuum was applied to fetal vertex and with one pop-off the head was delivered, and then the body in standard fashion. After waiting 60 seconds for delayed cord cutting, the umbilical cord was clamped and cut cord blood was obtained for evaluation. Cord ph was not sent. The placenta was removed Intact and appeared normal. The uterine incision was closed with running unlocked suture of 0-Vicryl. Mid-way through the closure a Mirena IUD was manually placed in the uterine fundus and the strings were tucked down toward the cervix. For bleeding at the left margin of the hysterotomy several 0 vicryl sutures were placed. The rectus muscles were examined and hemostasis observed after bovie cautery in several locations. The fascia was then reapproximated with running sutures of 0-Vicryl. The subcuticular closure was performed using 2-0 plain gut. The skin was closed with 4-0 Vicryl. Prevena wound vacuum was placed. ? ?Instrument, sponge,  and needle counts were correct prior the abdominal closure and were correct at the conclusion of the case.  ? ? ? ?Disposition: PACU -  hemodynamically stable.  ? ?Maternal Condition: stable  ? ? ? ? ? ?Signed: ?Patsy Lager WoukMD 12/11/2021 2:08 PM ? ? ?

## 2021-12-11 NOTE — Discharge Summary (Signed)
Postpartum Discharge Summary  Date of Service updated***     Patient Name: Julia Armstrong DOB: 1991-05-11 MRN: [id number]  Date of admission: 12/11/2021 Delivery date:12/11/2021  Delivering provider: Laurey Arrow [place]  Date of discharge: 12/13/2021  Admitting diagnosis: History of cesarean section [Z98.891] Status post repeat low transverse cesarean section [Z98.891] Intrauterine pregnancy: [redacted]w[redacted]d     Secondary diagnosis:  Principal Problem:   History of cesarean section Active Problems:   Previous cesarean delivery, antepartum   Obesity in pregnancy   GDM (gestational diabetes mellitus)   IUD (intrauterine device) in place   Status post repeat low transverse cesarean section  Additional problems: ***None    Discharge diagnosis: Term Pregnancy Delivered and GDM A2                                              Post partum procedures: None Augmentation: N/A Complications: None  Hospital course: Sceduled C/S   [name] at [redacted]w[redacted]d was admitted to the hospital 12/11/2021 for scheduled cesarean section with the following indication: repeat (previous x4) .Delivery details are as follows:  Membrane Rupture Time/Date: 1:29 PM ,12/11/2021   Delivery Method:C-Section, Low Transverse  Details of operation can be found in separate operative note.  Patient had an uncomplicated postpartum course.  She is ambulating, tolerating a regular diet, passing flatus, and urinating well. Patient is discharged home in stable condition on  12/13/21        Newborn Data: Birth date:12/11/2021  Birth time:1:31 PM  Gender:Female  Living status:Living  Apgars:7 ,8  Weight:3100 g     Magnesium Sulfate received: No BMZ received: No Rhophylac:No MMR:No T-DaP:Given prenatally Flu: No Transfusion:No  Physical exam  Vitals:   12/12/21 0430 12/12/21 0730 12/12/21 1400 12/12/21 2103  BP: 126/70 (!) 121/55 120/75 131/77  Pulse: 74  74 69  Resp: $Remo'18 18 18 19  'haRhy$ Temp: 98.1 F (36.7 C) 98 F  (36.7 C) 98.1 F (36.7 C) 98.2 F (36.8 C)  TempSrc: Oral Oral Oral Oral  SpO2:  100% 100% 100%   General: {Exam; general:21111117} Lochia: {Desc; appropriate/inappropriate:30686::"appropriate"} Uterine Fundus: {Desc; firm/soft:30687} Incision: {Exam; incision:21111123} DVT Evaluation: {Exam; JYN:8295621} Labs: Lab Results  Component Value Date   WBC 11.4 (H) 12/12/2021   HGB 10.5 (L) 12/12/2021   HCT 31.8 (L) 12/12/2021   MCV 76.1 (L) 12/12/2021   PLT 209 12/12/2021      Latest Ref Rng & Units 06/18/2021   12:37 AM  CMP  Glucose 70 - 99 mg/dL 117    BUN 6 - 20 mg/dL 5    Creatinine 0.44 - 1.00 mg/dL 0.70    Sodium 135 - 145 mmol/L 136    Potassium 3.5 - 5.1 mmol/L 3.5    Chloride 98 - 111 mmol/L 103    CO2 22 - 32 mmol/L 24    Calcium 8.9 - 10.3 mg/dL 9.4    Total Protein 6.5 - 8.1 g/dL 6.5    Total Bilirubin 0.3 - 1.2 mg/dL 0.4    Alkaline Phos 38 - 126 U/L 47    AST 15 - 41 U/L 16    ALT 0 - 44 U/L 14     Edinburgh Score:    12/12/2021    8:07 AM  Edinburgh Postnatal Depression Scale Screening Tool  I have been able to laugh and see the funny  side of things. 0  I have looked forward with enjoyment to things. 0  I have blamed myself unnecessarily when things went wrong. 0  I have been anxious or worried for no good reason. 0  I have felt scared or panicky for no good reason. 0  Things have been getting on top of me. 0  I have been so unhappy that I have had difficulty sleeping. 0  I have felt sad or miserable. 1  I have been so unhappy that I have been crying. 0  The thought of harming myself has occurred to me. 0  Edinburgh Postnatal Depression Scale Total 1     After visit meds:  Allergies as of 12/13/2021   No Known Allergies   Med Rec must be completed prior to using this [facility]***        Discharge home in stable condition Infant Feeding: Bottle Infant Disposition:NICU Discharge instruction: per After Visit Summary and Postpartum  booklet. Activity: Advance as tolerated. Pelvic rest for 6 weeks.  Diet: routine diet Future Appointments: Future Appointments  Date Time Provider [place]  12/18/2021  1:30 PM [facility] NURSE [facility]  01/20/2022  8:20 AM [facility]  01/20/2022 10:35 AM Patriciaann Clan, DO [facility]   Follow up Visit:  Message sent to [facility] by Dr Higinio Plan: Please schedule this patient for a In person postpartum visit in 6 weeks with the following provider: Any provider. Additional Postpartum F/U:2 hour GTT and Incision check 1 week  High risk pregnancy complicated by: GDM and previous CS x4 Delivery mode:  C-Section, Low Transverse  Anticipated Birth Control:  PP IUD placed   12/13/2021 Renard Matter, MD

## 2021-12-11 NOTE — Anesthesia Preprocedure Evaluation (Signed)
Anesthesia Evaluation  ?Patient identified by MRN, date of birth, ID band ?Patient awake ? ? ? ?Reviewed: ?Allergy & Precautions, NPO status , Patient's Chart, lab work & pertinent test results ? ?History of Anesthesia Complications ?Negative for: history of anesthetic complications ? ?Airway ?Mallampati: I ? ?TM Distance: >3 FB ?Neck ROM: Full ? ? ? Dental ?no notable dental hx. ?(+) Teeth Intact, Dental Advisory Given, Chipped ?  ?Pulmonary ?neg pulmonary ROS,  ?  ?Pulmonary exam normal ?breath sounds clear to auscultation ? ? ? ? ? ? Cardiovascular ?negative cardio ROS ?Normal cardiovascular exam ?Rhythm:Regular Rate:Normal ? ? ?  ?Neuro/Psych ?negative neurological ROS ? negative psych ROS  ? GI/Hepatic ?negative GI ROS, Neg liver ROS,   ?Endo/Other  ?diabetesMorbid obesity ? Renal/GU ?negative Renal ROS  ?negative genitourinary ?  ?Musculoskeletal ?negative musculoskeletal ROS ?(+)  ? Abdominal ?  ?Peds ?negative pediatric ROS ?(+)  Hematology ?negative hematology ROS ?(+)   ?Anesthesia Other Findings ? ? Reproductive/Obstetrics ?(+) Pregnancy ?Hx of C/S x3 ? ?  ? ? ? ? ? ? ? ? ? ? ? ? ? ?  ?  ? ? ? ? ? ? ? ? ?Anesthesia Physical ? ?Anesthesia Plan ? ?ASA: 3 ? ?Anesthesia Plan: Spinal  ? ?Post-op Pain Management:   ? ?Induction:  ? ?PONV Risk Score and Plan: 2 and Ondansetron, Dexamethasone and Treatment may vary due to age or medical condition ? ?Airway Management Planned: Natural Airway ? ?Additional Equipment: None ? ?Intra-op Plan:  ? ?Post-operative Plan:  ? ?Informed Consent: I have reviewed the patients History and Physical, chart, labs and discussed the procedure including the risks, benefits and alternatives for the proposed anesthesia with the patient or authorized representative who has indicated his/her understanding and acceptance.  ? ? ? ? ? ?Plan Discussed with: CRNA, Surgeon and Anesthesiologist ? ?Anesthesia Plan Comments: ( ? ?)  ? ? ? ? ? ? ?Anesthesia Quick  Evaluation ? ?

## 2021-12-12 ENCOUNTER — Encounter (HOSPITAL_COMMUNITY): Payer: Self-pay | Admitting: Obstetrics and Gynecology

## 2021-12-12 LAB — CBC
HCT: 31.8 % — ABNORMAL LOW (ref 36.0–46.0)
Hemoglobin: 10.5 g/dL — ABNORMAL LOW (ref 12.0–15.0)
MCH: 25.1 pg — ABNORMAL LOW (ref 26.0–34.0)
MCHC: 33 g/dL (ref 30.0–36.0)
MCV: 76.1 fL — ABNORMAL LOW (ref 80.0–100.0)
Platelets: 209 10*3/uL (ref 150–400)
RBC: 4.18 MIL/uL (ref 3.87–5.11)
RDW: 15.7 % — ABNORMAL HIGH (ref 11.5–15.5)
WBC: 11.4 10*3/uL — ABNORMAL HIGH (ref 4.0–10.5)
nRBC: 0 % (ref 0.0–0.2)

## 2021-12-12 NOTE — Progress Notes (Signed)
Patient was in NICU when lab arrived this morning. FOB brought in breakfast and patient ate on her return to room prior to lab being able to draw labs, including fasting glucose. Philipp Deputy, CNM updated. Glucose cancelled this am due to patient eating before lab and ordered for tomorrow.

## 2021-12-12 NOTE — Progress Notes (Addendum)
Subjective: Postpartum Day 1: Cesarean Delivery Patient reports generally feeling well. She reports abdominal pain when ambulating that is tolerable and consistent with her previous c-section postpartum courses. She has not yet voided or had a bowel movement. She denies excessive bleeding or discharge. She reports breastfeeding is going well.   Patient ate this morning so will collect fasting glucose tomorrow.   Circumcision delayed d/t baby in NICU for respiratory distress/TTN.   Objective: Vital signs in last 24 hours: Temp:  [97.5 F (36.4 C)-98.2 F (36.8 C)] 98 F (36.7 C) (05/18 0730) Pulse Rate:  [66-74] 74 (05/18 0430) Resp:  [11-19] 18 (05/18 0730) BP: (107-139)/(46-75) 121/55 (05/18 0730) SpO2:  [99 %-100 %] 100 % (05/18 0730)  Physical Exam:  General: alert, appears stated age, and no distress Lochia: appropriate Uterine Fundus: firm Incision: healing well DVT Evaluation: No evidence of DVT seen on physical exam.  Recent Labs    12/10/21 1007 12/12/21 0731  HGB 11.3* 10.5*  HCT 34.6* 31.8*    Assessment/Plan: Status post Cesarean section. Doing well postoperatively.  Continue current care. -Fasting glucose tomorrow AM.   Spero Curb 12/12/2021, 8:34 AM

## 2021-12-13 LAB — GLUCOSE, CAPILLARY: Glucose-Capillary: 90 mg/dL (ref 70–99)

## 2021-12-13 LAB — CULTURE, BETA STREP (GROUP B ONLY): Strep Gp B Culture: NEGATIVE

## 2021-12-13 NOTE — Clinical Social Work Maternal (Signed)
CLINICAL SOCIAL WORK MATERNAL/CHILD NOTE  Patient Details  Name: Julia Armstrong MRN: [id number] Date of Birth: 10/11/1990  Date:  2021-11-27  Clinical Social Worker Initiating Note:  Laurey Arrow Date/Time: Initiated:  12/13/21/1201     Child's Name:  Julia Armstrong   Biological Parents:  Mother, Father   Need for Interpreter:  None   Reason for Referral:  Current Substance Use/Substance Use During Pregnancy     Address:  [place] [address]    Phone number:  [phone] (home)     Additional phone number: FOB's telephone number is [phone]  Household Members/Support Persons (HM/SP):   Household Member/Support Person 1, Household Member/Support Person 2, Household Member/Support Person 3, Household Member/Support Person 4, Household Member/Support Person 5   HM/SP Name Relationship DOB or Age  HM/SP -1 Mister [employer]. 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   Homebound arranged: No (MOB reported she completed the 11th grade.)  Financial Resources:  [employer]   Other Resources:  Physicist, medical  , [facility]   Cultural/Religious Considerations Which May Impact Care:  None reported  Strengths:  Ability to meet basic needs  , Home prepared for child  , Pediatrician chosen   Psychotropic Medications:         Pediatrician:    [place] area  Pediatrician List:   Engineer, petroleum Care)  [place]      Pediatrician Fax Number:    Risk Factors/Current Problems:  Substance Use     Cognitive State:  Alert  , Able to Concentrate  , Linear Thinking  , Insightful     Mood/Affect:  Relaxed  , Interested  , Happy  , Comfortable  , Calm     CSW Assessment: CSW met with MOB in room 403 to complete an assessment for SA hx. When CSW arrived, MOB was resting in bed and FOB was resting on th couch (Infant is in NICU in room 345).  CSW explained CSW's role and MOB gave CSW permission to ask FOB to leave in order to assess MOB in private; FOB left without incident. MOB was polite, easy to engage, and receptive to meeting with CSW.   MOB reported feeling well informed by NICU team and denied having any questions or concerns. CSW reviewed NICU visitation and resources and supports available to MOB while infant remains in the NICU.   MOB reports having all essential items to care for infant post discharge including a new car seat and a packn play.  MOB also shared having a good support team that consists of MOB's and FOB's family members.   CSW asked about MOB's SA hx.  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 MOB also shared she unknowingly took an edible 2 months  ago while vacationing with her family in [place]; per MOB she informed her OB provider. CSW reviewed hospital's substance use policy and MOB was understanding. MOB was made aware of drug screens for infant.  CSW informed MOB that infant's UDS was negative and CSW will continue to monitor infant's CDS and will make a report a to [facility] if warranted. MOB acknowledged CPS hx and communicated her last open case was 3 years ago and per MOB the case closed after 45 days. MOB expressed no concerns about infant's drug screens and declined SA resources.   CSW will continue to offer resources and supports to family while infant remains in NICU.   CSW Plan/Description:  Psychosocial Support and  Ongoing Assessment of Needs, Sudden Infant Death Syndrome (SIDS) Education, Perinatal Mood and Anxiety Disorder (PMADs) Education, Other Patient/Family Education, [place], Other Information/Referral to [employer], CSW Will Continue to Monitor Umbilical Cord Tissue Drug Screen Results and Make Report if Warranted   Laurey Arrow, MSW, LCSW Clinical Social Work [phone]   Dimple Nanas, [place] 12/13/2021, 12:06 PM

## 2021-12-13 NOTE — Anesthesia Postprocedure Evaluation (Signed)
Anesthesia Post Note  Patient: Julia Armstrong  Procedure(s) Performed: CESAREAN SECTION     Patient location during evaluation: PACU Anesthesia Type: Spinal Level of consciousness: oriented and awake and alert Pain management: pain level controlled Vital Signs Assessment: post-procedure vital signs reviewed and stable Respiratory status: spontaneous breathing, respiratory function stable and patient connected to nasal cannula oxygen Cardiovascular status: blood pressure returned to baseline and stable Postop Assessment: no headache, no backache and no apparent nausea or vomiting Anesthetic complications: no   No notable events documented.  Last Vitals:  Vitals:   12/12/21 1400 12/12/21 2103  BP: 120/75 131/77  Pulse: 74 69  Resp: 18 19  Temp: 36.7 C 36.8 C  SpO2: 100% 100%    Last Pain:  Vitals:   12/13/21 0315  TempSrc:   PainSc: 0-No pain   Pain Goal:                   Rahman Ferrall

## 2021-12-13 NOTE — Progress Notes (Addendum)
POSTPARTUM PROGRESS NOTE  Subjective: Julia Armstrong is a 31 y.o. [id number] POD#2 s/p rLTCS at [redacted]w[redacted]d.  She reports she doing well. No acute events overnight. She denies any problems with ambulating, voiding or po intake. Denies nausea or vomiting. She has  passed flatus. Pain is moderately controlled.  Lochia is scant.  Objective: Blood pressure 131/77, pulse 69, temperature 98.2 F (36.8 C), temperature source Oral, resp. rate 19, last menstrual period 03/01/2021, SpO2 100 %.  Physical Exam:  General: alert, cooperative and no distress Chest: no respiratory distress Abdomen: soft, non-tender  incision clean/dry/intact and prevena in place  Uterine Fundus: firm, appropriately tender Extremities: No calf swelling or tenderness  mild edema  Recent Labs    12/10/21 1007 12/12/21 0731  HGB 11.3* 10.5*  HCT 34.6* 31.8*    Assessment/Plan: Julia Armstrong is a 31 y.o. [id number] POD#2 s/p rLTCS at [redacted]w[redacted]d.  POD#2: Doing well, pain well-controlled. H/H appropriate.  -- Routine postpartum care -- Encouraged up OOB -- Lovenox for VTE prophylaxis  Routine Postpartum Care -- Contraception: ppIUD placed during CS -- Feeding: bottle (baby in NICU)  #A2GDM On Metformin in pregnancy. FBG pending today  Dispo: Plan for discharge POD#3. Discussed with patient regarding stable for DC today. Discussed she could go to NICU and stay with baby if gets discharged as patient. Patient would like to remain as a patient for one more day.   Warner Mccreedy, MD, MPH OB Fellow, [facility] for [facility]

## 2021-12-14 MED ORDER — IBUPROFEN 600 MG PO TABS: 600.0000 mg | ORAL_TABLET | Freq: Four times a day (QID) | ORAL | 0 refills | Status: DC | PRN

## 2021-12-14 MED ORDER — ACETAMINOPHEN 500 MG PO TABS: 1000.0000 mg | ORAL_TABLET | Freq: Three times a day (TID) | ORAL | 0 refills | Status: AC | PRN

## 2021-12-14 MED ORDER — OXYCODONE HCL 5 MG PO TABS: 5.0000 mg | ORAL_TABLET | Freq: Four times a day (QID) | ORAL | 0 refills | Status: DC | PRN

## 2021-12-14 MED ORDER — OXYCODONE HCL 5 MG PO TABS
5.0000 mg | ORAL_TABLET | Freq: Once | ORAL | Status: AC
  Administered 2021-12-14: 5 mg via ORAL

## 2021-12-14 MED ORDER — SENNOSIDES-DOCUSATE SODIUM 8.6-50 MG PO TABS
2.0000 | ORAL_TABLET | ORAL | 0 refills | Status: DC
Start: 2021-12-14 — End: 2021-12-19

## 2021-12-16 ENCOUNTER — Other Ambulatory Visit: Payer: Self-pay

## 2021-12-16 ENCOUNTER — Encounter: Payer: Medicaid Other | Admitting: Obstetrics and Gynecology

## 2021-12-16 ENCOUNTER — Encounter (HOSPITAL_COMMUNITY): Payer: Self-pay | Admitting: Obstetrics and Gynecology

## 2021-12-16 ENCOUNTER — Inpatient Hospital Stay (EMERGENCY_DEPARTMENT_HOSPITAL)
Admission: AD | Admit: 2021-12-16 | Discharge: 2021-12-16 | Disposition: A | Discharge status: Home / Self Care | Payer: Medicaid Other | Source: Home / Self Care | Attending: Obstetrics and Gynecology | Admitting: Obstetrics and Gynecology

## 2021-12-16 DIAGNOSIS — R519 Headache, unspecified: Secondary | ICD-10-CM

## 2021-12-16 DIAGNOSIS — R0789 Other chest pain: Secondary | ICD-10-CM

## 2021-12-16 DIAGNOSIS — O9089 Other complications of the puerperium, not elsewhere classified: Secondary | ICD-10-CM | POA: Insufficient documentation

## 2021-12-16 DIAGNOSIS — L7682 Other postprocedural complications of skin and subcutaneous tissue: Secondary | ICD-10-CM

## 2021-12-16 LAB — BIRTH TISSUE RECOVERY COLLECTION (PLACENTA DONATION)

## 2021-12-16 MED ORDER — IBUPROFEN 800 MG PO TABS
800.0000 mg | ORAL_TABLET | Freq: Once | ORAL | Status: AC
  Administered 2021-12-16: 800 mg via ORAL
  Filled 2021-12-16: qty 1

## 2021-12-16 MED ORDER — OXYCODONE-ACETAMINOPHEN 5-325 MG PO TABS
2.0000 | ORAL_TABLET | Freq: Once | ORAL | Status: AC
  Administered 2021-12-16: 2 via ORAL
  Filled 2021-12-16: qty 2
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
## 2021-12-16 NOTE — MAU Note (Signed)
.  Julia Armstrong is a 31 y.o. at [facility] C/S 4 days here in MAU reporting: chest pain in her upper right side that feels like pressure, incision pain like a stabbing pain, and HA. Pt states she is taking her pain medication on schedule, Oxycodone, tylenol, and motrin. Pt reports mild ankle swelling as well. Pt states her chest pain cause her difficult to breath when she lays down.   Onset of complaint: yesterday  Pain score: 8/10 HA, 6/10 chest, incision 10/10 Vitals:   12/16/21 0127  BP: 129/66  Pulse: 77  Resp: 18  Temp: 98.9 F (37.2 C)  SpO2: 99%      Lab orders placed from triage:  none

## 2021-12-17 ENCOUNTER — Encounter (HOSPITAL_COMMUNITY): Payer: Self-pay | Admitting: Obstetrics and Gynecology

## 2021-12-17 ENCOUNTER — Ambulatory Visit: Payer: Medicaid Other

## 2021-12-17 ENCOUNTER — Inpatient Hospital Stay (HOSPITAL_COMMUNITY): Payer: Medicaid Other

## 2021-12-17 ENCOUNTER — Inpatient Hospital Stay (HOSPITAL_COMMUNITY)
Admission: AD | Admit: 2021-12-17 | Discharge: 2021-12-19 | DRG: 776 | Disposition: A | Discharge status: Home / Self Care | Payer: Medicaid Other | Attending: Obstetrics and Gynecology | Admitting: Obstetrics and Gynecology

## 2021-12-17 ENCOUNTER — Other Ambulatory Visit: Payer: Self-pay

## 2021-12-17 DIAGNOSIS — O1495 Unspecified pre-eclampsia, complicating the puerperium: Secondary | ICD-10-CM | POA: Diagnosis not present

## 2021-12-17 DIAGNOSIS — M79605 Pain in left leg: Secondary | ICD-10-CM

## 2021-12-17 DIAGNOSIS — O99893 Other specified diseases and conditions complicating puerperium: Secondary | ICD-10-CM | POA: Diagnosis present

## 2021-12-17 DIAGNOSIS — L7682 Other postprocedural complications of skin and subcutaneous tissue: Secondary | ICD-10-CM | POA: Diagnosis not present

## 2021-12-17 DIAGNOSIS — M79604 Pain in right leg: Secondary | ICD-10-CM | POA: Diagnosis present

## 2021-12-17 DIAGNOSIS — O1415 Severe pre-eclampsia, complicating the puerperium: Principal | ICD-10-CM | POA: Diagnosis present

## 2021-12-17 DIAGNOSIS — M79669 Pain in unspecified lower leg: Secondary | ICD-10-CM | POA: Diagnosis present

## 2021-12-17 HISTORY — DX: Unspecified pre-eclampsia, complicating the puerperium: O14.95

## 2021-12-17 LAB — CBC WITH DIFFERENTIAL/PLATELET
Abs Immature Granulocytes: 0.05 10*3/uL (ref 0.00–0.07)
Basophils Absolute: 0 10*3/uL (ref 0.0–0.1)
Basophils Relative: 0 %
Eosinophils Absolute: 0.1 10*3/uL (ref 0.0–0.5)
Eosinophils Relative: 2 %
HCT: 34.2 % — ABNORMAL LOW (ref 36.0–46.0)
Hemoglobin: 10.6 g/dL — ABNORMAL LOW (ref 12.0–15.0)
Immature Granulocytes: 1 %
Lymphocytes Relative: 32 %
Lymphs Abs: 1.8 10*3/uL (ref 0.7–4.0)
MCH: 24.4 pg — ABNORMAL LOW (ref 26.0–34.0)
MCHC: 31 g/dL (ref 30.0–36.0)
MCV: 78.6 fL — ABNORMAL LOW (ref 80.0–100.0)
Monocytes Absolute: 0.4 10*3/uL (ref 0.1–1.0)
Monocytes Relative: 6 %
Neutro Abs: 3.3 10*3/uL (ref 1.7–7.7)
Neutrophils Relative %: 59 %
Platelets: 229 10*3/uL (ref 150–400)
RBC: 4.35 MIL/uL (ref 3.87–5.11)
RDW: 15.9 % — ABNORMAL HIGH (ref 11.5–15.5)
WBC: 5.6 10*3/uL (ref 4.0–10.5)
nRBC: 0 % (ref 0.0–0.2)

## 2021-12-17 LAB — COMPREHENSIVE METABOLIC PANEL
ALT: 38 U/L (ref 0–44)
AST: 36 U/L (ref 15–41)
Albumin: 2.5 g/dL — ABNORMAL LOW (ref 3.5–5.0)
Alkaline Phosphatase: 93 U/L (ref 38–126)
Anion gap: 9 (ref 5–15)
BUN: 8 mg/dL (ref 6–20)
CO2: 25 mmol/L (ref 22–32)
Calcium: 8.9 mg/dL (ref 8.9–10.3)
Chloride: 107 mmol/L (ref 98–111)
Creatinine, Ser: 0.68 mg/dL (ref 0.44–1.00)
GFR, Estimated: >60 mL/min (ref >60)
Glucose, Bld: 101 mg/dL — ABNORMAL HIGH (ref 70–99)
Potassium: 3.4 mmol/L — ABNORMAL LOW (ref 3.5–5.1)
Sodium: 141 mmol/L (ref 135–145)
Total Bilirubin: <0.1 mg/dL — ABNORMAL LOW (ref 0.3–1.2)
Total Protein: 5.9 g/dL — ABNORMAL LOW (ref 6.5–8.1)

## 2021-12-17 LAB — BRAIN NATRIURETIC PEPTIDE: B Natriuretic Peptide: 75.9 pg/mL (ref 0.0–100.0)

## 2021-12-17 MED ORDER — SODIUM CHLORIDE 0.9 % IV SOLN: INTRAVENOUS | Status: DC

## 2021-12-17 MED ORDER — IBUPROFEN 800 MG PO TABS
800.0000 mg | ORAL_TABLET | Freq: Three times a day (TID) | ORAL | Status: DC
  Administered 2021-12-17 – 2021-12-19 (×6): 800 mg via ORAL
  Filled 2021-12-17 (×6): qty 1

## 2021-12-17 MED ORDER — NIFEDIPINE ER OSMOTIC RELEASE 30 MG PO TB24
30.0000 mg | ORAL_TABLET | Freq: Once | ORAL | Status: AC
  Administered 2021-12-17: 30 mg via ORAL
  Filled 2021-12-17: qty 1

## 2021-12-17 MED ORDER — LACTATED RINGERS IV SOLN: INTRAVENOUS | Status: DC

## 2021-12-17 MED ORDER — HYDRALAZINE HCL 20 MG/ML IJ SOLN: 10.0000 mg | INTRAMUSCULAR | Status: DC | PRN

## 2021-12-17 MED ORDER — LABETALOL HCL 5 MG/ML IV SOLN: 20.0000 mg | INTRAVENOUS | Status: DC | PRN

## 2021-12-17 MED ORDER — ZOLPIDEM TARTRATE 5 MG PO TABS: 5.0000 mg | ORAL_TABLET | Freq: Every evening | ORAL | Status: DC | PRN

## 2021-12-17 MED ORDER — FUROSEMIDE 10 MG/ML IJ SOLN
10.0000 mg | Freq: Two times a day (BID) | INTRAMUSCULAR | Status: DC
  Administered 2021-12-17: 10 mg via INTRAVENOUS
  Filled 2021-12-17: qty 2

## 2021-12-17 MED ORDER — IOHEXOL 350 MG/ML SOLN
50.0000 mL | Freq: Once | INTRAVENOUS | Status: AC | PRN
  Administered 2021-12-17: 50 mL via INTRAVENOUS

## 2021-12-17 MED ORDER — OXYCODONE-ACETAMINOPHEN 5-325 MG PO TABS
1.0000 | ORAL_TABLET | Freq: Four times a day (QID) | ORAL | Status: DC | PRN
  Administered 2021-12-17 – 2021-12-18 (×2): 1 via ORAL
  Filled 2021-12-17 (×2): qty 1

## 2021-12-17 MED ORDER — SENNOSIDES-DOCUSATE SODIUM 8.6-50 MG PO TABS: 1.0000 | ORAL_TABLET | Freq: Every evening | ORAL | Status: DC | PRN

## 2021-12-17 MED ORDER — DIPHENHYDRAMINE HCL 25 MG PO CAPS: 50.0000 mg | ORAL_CAPSULE | ORAL | Status: DC | PRN

## 2021-12-17 MED ORDER — MAGNESIUM SULFATE BOLUS VIA INFUSION
4.0000 g | Freq: Once | INTRAVENOUS | Status: AC
  Administered 2021-12-17: 4 g via INTRAVENOUS
  Filled 2021-12-17: qty 1000

## 2021-12-17 MED ORDER — ONDANSETRON 4 MG PO TBDP: 4.0000 mg | ORAL_TABLET | Freq: Four times a day (QID) | ORAL | Status: DC | PRN

## 2021-12-17 MED ORDER — DIPHENHYDRAMINE HCL 50 MG/ML IJ SOLN
50.0000 mg | Freq: Once | INTRAMUSCULAR | Status: AC
  Administered 2021-12-17: 50 mg via INTRAVENOUS
  Filled 2021-12-17: qty 1

## 2021-12-17 MED ORDER — MAGNESIUM SULFATE 40 GM/1000ML IV SOLN
2.0000 g/h | INTRAVENOUS | Status: AC
  Administered 2021-12-17 – 2021-12-18 (×2): 2 g/h via INTRAVENOUS
  Filled 2021-12-17 (×2): qty 1000

## 2021-12-17 MED ORDER — LABETALOL HCL 5 MG/ML IV SOLN: 40.0000 mg | INTRAVENOUS | Status: DC | PRN

## 2021-12-17 MED ORDER — ENALAPRIL MALEATE 5 MG PO TABS
5.0000 mg | ORAL_TABLET | Freq: Every day | ORAL | Status: DC
  Administered 2021-12-18 – 2021-12-19 (×2): 5 mg via ORAL
  Filled 2021-12-17 (×3): qty 1

## 2021-12-17 MED ORDER — LABETALOL HCL 5 MG/ML IV SOLN: 80.0000 mg | INTRAVENOUS | Status: DC | PRN

## 2021-12-17 NOTE — MAU Note (Addendum)
...  Julia Armstrong is a 31 y.o. at 6 days post partum here in MAU reporting: Bilateral calf pain and anterior left lower leg pain since 0600 this morning that increases with ambulation. She reports she was not experiencing this pain yesterday. She reports she is still experiencing SOB with lying down coupled with "heavy chest pressure." She reports she was evaluated in MAU for this two nights ago. She reports she is having incisional pain as well.   She reports she has not increased any physical activity outside of walking to the NICU to see her baby.  Pain score:  6/10 bilateral calf pain 6/10 incisional pain

## 2021-12-17 NOTE — Progress Notes (Signed)
Bilateral lower extremity venous duplex completed. Refer to "CV Proc" under chart review to view preliminary results.  12/17/2021 12:37 PM Kelby Aline., MHA, RVT, RDCS, RDMS

## 2021-12-17 NOTE — MAU Note (Signed)
VAS U/S Tech at bedside.
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
## 2021-12-17 NOTE — MAU Note (Signed)
CT stated they were placing transport for patient now.

## 2021-12-17 NOTE — Plan of Care (Signed)
  Problem: Education: Goal: Knowledge of General Education information will improve Description: Including pain rating scale, medication(s)/side effects and non-pharmacologic comfort measures Outcome: Completed/Met

## 2021-12-17 NOTE — MAU Note (Signed)
Malachi Carl, RT, with CT stated she will place orders for patient to come to CT once her labs are resulted.

## 2021-12-17 NOTE — MAU Note (Signed)
Transport arrived to take patient to CT. CNM made aware.

## 2021-12-18 ENCOUNTER — Ambulatory Visit: Payer: Medicaid Other

## 2021-12-18 DIAGNOSIS — O1495 Unspecified pre-eclampsia, complicating the puerperium: Secondary | ICD-10-CM

## 2021-12-18 NOTE — Progress Notes (Signed)
Patient ID: Julia Armstrong, female   DOB: 1990-08-05, 31 y.o.   MRN: [id number] HD # 1 POD # 6 RLTCS and PP SPEC  Pt reports feeling better today except for the magnesium feeling. Denies CP or SOB. Tolerating diet and up to rest room without problems  PE AF  BP 110's/50-70's Excellent UOP  Lungs clear Heart RRR Abd soft, BS, Provena removed, incision healing well Ext trace edema, Nl DTR's, no clonus  A/P Stable           Will complete 24 hrs magnesium later today. If BP remains stable will plan for discharge home tomorrow.

## 2021-12-19 ENCOUNTER — Other Ambulatory Visit (HOSPITAL_COMMUNITY): Payer: Self-pay

## 2021-12-19 MED ORDER — FUROSEMIDE 20 MG PO TABS: 20.0000 mg | ORAL_TABLET | Freq: Every day | ORAL | Status: DC

## 2021-12-19 MED ORDER — ENALAPRIL MALEATE 5 MG PO TABS
5.0000 mg | ORAL_TABLET | Freq: Every day | ORAL | 0 refills | Status: DC
  Filled 2021-12-19: qty 42, 42d supply, fill #0

## 2021-12-19 MED ORDER — SENNOSIDES-DOCUSATE SODIUM 8.6-50 MG PO TABS: 2.0000 | ORAL_TABLET | Freq: Every evening | ORAL | 0 refills | Status: AC | PRN

## 2021-12-19 MED ORDER — FUROSEMIDE 20 MG PO TABS
20.0000 mg | ORAL_TABLET | Freq: Every day | ORAL | 0 refills | Status: DC
Start: 2021-12-20 — End: 2021-12-19
  Filled 2021-12-19: qty 4, 4d supply, fill #0

## 2021-12-19 NOTE — Discharge Summary (Signed)
Physician Discharge Summary  Patient ID: Julia Armstrong MRN: [id number] DOB/AGE: 04-Apr-1991 31 y.o.  Admit date: 12/17/2021 Discharge date: 12/19/2021  Admission Diagnoses: POD#6 after rpt c/s and LNG IUD placement. Bilateral leg pain. PP severe pre-eclampsia based on BPs  Discharge Diagnoses: Improved signs and symptoms   Discharged Condition: good  Hospital Course: Patient started on Mg and enalapril as well as a dose of IV lasix. She diuresed well and her pressures came down nicely and stayed good off Mg, which she received for 24 hours.  She had negative bilateral LE dopplers Her Prevena was removed during her hospitalization and her incision looked well  Discharge Exam: Blood pressure 125/70, pulse 78, temperature 98.3 F (36.8 C), temperature source Oral, resp. rate 18, height 5\' 7"  (1.702 m), SpO2 100 %, not currently breastfeeding. General: NAD Abdomen: soft, nttp CV: normal s1 and s2, no MRGs Pulmonary: CTAB  Disposition: Discharge disposition: 01-Home or Self Care       Discharge Instructions     Discharge patient   Complete by: As directed    After AM meds are given   Discharge disposition: 01-Home or Self Care   Discharge patient date: 12/19/2021      Allergies as of 12/19/2021   No Known Allergies      Medication List     TAKE these medications    acetaminophen 500 MG tablet Commonly known as: TYLENOL Take 2 tablets (1,000 mg total) by mouth every 8 (eight) hours as needed (pain).   albuterol 108 (90 Base) MCG/ACT inhaler Commonly known as: VENTOLIN HFA Inhale 2 puffs into the lungs every 6 (six) hours as needed for wheezing or shortness of breath.   enalapril 5 MG tablet Commonly known as: VASOTEC Take 1 tablet (5 mg total) by mouth daily.   furosemide 20 MG tablet Commonly known as: LASIX Take 1 tablet (20 mg total) by mouth daily. Start taking on: Dec 20, 2021   ibuprofen 600 MG tablet Commonly known as: ADVIL Take 1 tablet (600  mg total) by mouth every 6 (six) hours as needed (pain).   oxyCODONE 5 MG immediate release tablet Commonly known as: Oxy IR/ROXICODONE Take 1 tablet (5 mg total) by mouth every 6 (six) hours as needed for severe pain.   Prenatal Vitamin 27-0.8 MG Tabs Take 1 tablet by mouth daily.   senna-docusate 8.6-50 MG tablet Commonly known as: Senokot-S Take 2 tablets by mouth at bedtime as needed for up to 10 days for mild constipation. What changed:  when to take this reasons to take this        Follow-up Information     [facility]. Go in 6 day(s).   Specialty: Obstetrics and Gynecology Why: call the office tomorrow before noon if you have not heard about your blood pressure check appointment in 5-6 days Contact information: [address] [place] [phone]                Signed: [phone] 12/19/2021, 7:42 AM

## 2021-12-19 NOTE — Discharge Instructions (Signed)
Hypertension During Pregnancy Hypertension is also called high blood pressure. High blood pressure means that the force of your blood moving in your body is too strong. It can cause problems for you and your baby. Different types of high blood pressure can happen during pregnancy. The types are:  High blood pressure before you got pregnant. This is called chronic hypertension.  This can continue during your pregnancy. Your doctor will want to keep checking your blood pressure. You may need medicine to keep your blood pressure under control while you are pregnant. You will need follow-up visits after you have your baby.  High blood pressure that goes up during pregnancy when it was normal before. This is called gestational hypertension. It will usually get better after you have your baby, but your doctor will need to watch your blood pressure to make sure that it is getting better.  Very high blood pressure during pregnancy. This is called preeclampsia. Very high blood pressure is an emergency that needs to be checked and treated right away.  You may develop very high blood pressure after giving birth. This is called postpartum preeclampsia. This usually occurs within 48 hours after childbirth but may occur up to 6 weeks after giving birth. This is rare. How does this affect me? If you have high blood pressure during pregnancy, you have a higher chance of developing high blood pressure:  As you get older.  If you get pregnant again. In some cases, high blood pressure during pregnancy can cause:  Stroke.  Heart attack.  Damage to the kidneys, lungs, or liver.  Preeclampsia.  Jerky movements you cannot control (convulsions or seizures).  Problems with the placenta.   What can I do to lower my risk?   Keep a healthy weight.  Eat a healthy diet.  Follow what your doctor tells you about treating any medical problems that you had before becoming pregnant. It is very important to go to  all of your doctor visits. Your doctor will check your blood pressure and make sure that your pregnancy is progressing as it should. Treatment should start early if a problem is found.   Follow these instructions at home:  Take your blood pressure 1-2 times per day. Call the office if your blood pressure is 155 or higher for the top number or 105 or higher for the bottom number.    Eating and drinking   Drink enough fluid to keep your pee (urine) pale yellow.  Avoid caffeine. Lifestyle  Do not use any products that contain nicotine or tobacco, such as cigarettes, e-cigarettes, and chewing tobacco. If you need help quitting, ask your doctor.  Do not use alcohol or drugs.  Avoid stress.  Rest and get plenty of sleep.  Regular exercise can help. Ask your doctor what kinds of exercise are best for you. General instructions  Take over-the-counter and prescription medicines only as told by your doctor.  Keep all prenatal and follow-up visits as told by your doctor. This is important. Contact a doctor if:  You have symptoms that your doctor told you to watch for, such as: ? Headaches. ? Nausea. ? Vomiting. ? Belly (abdominal) pain. ? Dizziness. ? Light-headedness. Get help right away if:  You have: ? Very bad belly pain that does not get better with treatment. ? A very bad headache that does not get better. ? Vomiting that does not get better. ? Sudden, fast weight gain. ? Sudden swelling in your hands, ankles, or face. ?   Blood in your pee. ? Blurry vision. ? Double vision. ? Shortness of breath. ? Chest pain. ? Weakness on one side of your body. ? Trouble talking. Summary  High blood pressure is also called hypertension.  High blood pressure means that the force of your blood moving in your body is too strong.  High blood pressure can cause problems for you and your baby.  Keep all follow-up visits as told by your doctor. This is important. This information is  not intended to replace advice given to you by your health care provider. Make sure you discuss any questions you have with your health care provider. Document Released: 08/16/2010 Document Revised: 11/04/2018 Document Reviewed: 08/10/2018 Elsevier Patient Education  2020 [employer]   

## 2021-12-19 NOTE — Plan of Care (Signed)
  Problem: Health Behavior/Discharge Planning: Goal: Ability to manage health-related needs will improve Outcome: Adequate for Discharge   Problem: Clinical Measurements: Goal: Ability to maintain clinical measurements within normal limits will improve Outcome: Adequate for Discharge Goal: Will remain free from infection Outcome: Adequate for Discharge Goal: Diagnostic test results will improve Outcome: Adequate for Discharge Goal: Respiratory complications will improve Outcome: Adequate for Discharge Goal: Cardiovascular complication will be avoided Outcome: Adequate for Discharge   Problem: Activity: Goal: Risk for activity intolerance will decrease Outcome: Adequate for Discharge   Problem: Nutrition: Goal: Adequate nutrition will be maintained Outcome: Adequate for Discharge   Problem: Coping: Goal: Level of anxiety will decrease Outcome: Adequate for Discharge   Problem: Elimination: Goal: Will not experience complications related to bowel motility Outcome: Adequate for Discharge Goal: Will not experience complications related to urinary retention Outcome: Adequate for Discharge   Problem: Pain Managment: Goal: General experience of comfort will improve Outcome: Adequate for Discharge   Problem: Safety: Goal: Ability to remain free from injury will improve Outcome: Adequate for Discharge   Problem: Skin Integrity: Goal: Risk for impaired skin integrity will decrease Outcome: Adequate for Discharge   Problem: Education: Goal: Knowledge of disease or condition will improve Outcome: Adequate for Discharge Goal: Knowledge of the prescribed therapeutic regimen will improve Outcome: Adequate for Discharge   Problem: Fluid Volume: Goal: Peripheral tissue perfusion will improve Outcome: Adequate for Discharge   Problem: Clinical Measurements: Goal: Complications related to disease process, condition or treatment will be avoided or minimized Outcome: Adequate for  Discharge

## 2021-12-24 ENCOUNTER — Encounter: Payer: Self-pay | Admitting: Family Medicine

## 2021-12-24 ENCOUNTER — Telehealth (HOSPITAL_COMMUNITY): Payer: Self-pay | Admitting: *Deleted

## 2021-12-24 NOTE — Telephone Encounter (Signed)
Mom reports feeling good. Incision healing well per mom. No concerns regarding herself at this time. EPDS=1 (hospital score=1) Mom reports baby is well. Feeding, peeing, and pooping without difficulty. Reviewed safe sleep. Mom has no concerns about baby at present.  Odis Hollingshead, RN 12-24-2021 at 1:53pm

## 2021-12-25 ENCOUNTER — Ambulatory Visit (INDEPENDENT_AMBULATORY_CARE_PROVIDER_SITE_OTHER): Payer: Medicaid Other

## 2021-12-25 ENCOUNTER — Ambulatory Visit: Payer: Medicaid Other

## 2021-12-25 VITALS — BP 133/89 | HR 92 | Wt 235.5 lb

## 2021-12-25 DIAGNOSIS — Z98891 History of uterine scar from previous surgery: Secondary | ICD-10-CM

## 2021-12-25 MED ORDER — OXYCODONE HCL 5 MG PO TABS: 5.0000 mg | ORAL_TABLET | Freq: Four times a day (QID) | ORAL | 0 refills | Status: DC | PRN

## 2021-12-25 NOTE — Progress Notes (Unsigned)
Pt here today for BP check s/p repeat c-section on 12/11/21.  Pt reports that she is having pain at incision site.  BP LA 133/89.  Pt reports last dose of Enlanapril was this am.  Pt denies headache and visual disturbances.    Rash- IV Lasix?

## 2021-12-28 ENCOUNTER — Other Ambulatory Visit: Payer: Self-pay | Admitting: Medical

## 2021-12-28 DIAGNOSIS — O24415 Gestational diabetes mellitus in pregnancy, controlled by oral hypoglycemic drugs: Secondary | ICD-10-CM

## 2021-12-31 ENCOUNTER — Encounter: Payer: Self-pay | Admitting: Family Medicine

## 2022-01-20 ENCOUNTER — Other Ambulatory Visit: Payer: Medicaid Other

## 2022-01-20 ENCOUNTER — Other Ambulatory Visit: Payer: Self-pay

## 2022-01-20 ENCOUNTER — Ambulatory Visit: Payer: Medicaid Other | Admitting: Family Medicine

## 2022-01-20 DIAGNOSIS — O24419 Gestational diabetes mellitus in pregnancy, unspecified control: Secondary | ICD-10-CM

## 2022-01-20 NOTE — Progress Notes (Deleted)
    Post Partum Visit Note  Julia Armstrong is a 31 y.o. [id number] female who presents for a postpartum visit. She is 5 weeks postpartum following a repeat cesarean section.  I have fully reviewed the prenatal and intrapartum course. The delivery was at 37 gestational weeks.  Anesthesia: spinal. Postpartum course has been ***. Baby is doing well***. Baby is feeding by {breastmilk/bottle:69}. Bleeding {vag bleed:12292}. Bowel function is {normal:32111}. Bladder function is {normal:32111}. Patient {is/is not:9024} sexually active. Contraception method is IUD. Postpartum depression screening: {gen negative/positive:315881}.   The pregnancy intention screening data noted above was reviewed. Potential methods of contraception were discussed. The patient elected to proceed with No data recorded.    Health Maintenance Due  Topic Date Due   COVID-19 Vaccine (2 - Pfizer series) 04/11/2021    {Common ambulatory SmartLinks:19316}  Review of Systems {ros; complete:30496}  Objective:  There were no vitals taken for this visit.   General:  {gen appearance:16600}   Breasts:  {desc; normal/abnormal/not indicated:14647}  Lungs: {lung exam:16931}  Heart:  {heart exam:5510}  Abdomen: {abdomen exam:16834}   Wound {Wound assessment:11097}  GU exam:  {desc; normal/abnormal/not indicated:14647}       Assessment:    1. Postpartum care and examination ***   *** postpartum exam.   Plan:   Essential components of care per ACOG recommendations:  1.  Mood and well being: Patient with {gen negative/positive:315881} depression screening today. Reviewed local resources for support.  - Patient tobacco use? {tobacco use:25506}  - hx of drug use? {yes/no:25505}    2. Infant care and feeding:  -Patient currently breastmilk feeding? {yes/no:25502}  -Social determinants of health (SDOH) reviewed in EPIC. No concerns***The following needs were identified***  3. Sexuality, contraception and birth  spacing - Patient {DOES_DOES WCB:76283} want a pregnancy in the next year.  Desired family size is {NUMBER 1-10:22536} children.  - Reviewed reproductive life planning. Reviewed contraceptive methods based on pt preferences and effectiveness.  Patient desired {Up[employer] Methods:24109} today.   - Discussed birth spacing of 18 months  4. Sleep and fatigue -Encouraged family/partner/community support of 4 hrs of uninterrupted sleep to help with mood and fatigue  5. Physical Recovery  - Discussed patients delivery and complications. She describes her labor as {description:25511} - Patient had a {CHL AMB DELIVERY:[phone]}. Patient had a {laceration:25518} laceration. Perineal healing reviewed. Patient expressed understanding - Patient has urinary incontinence? {yes/no:25515} - Patient {ACTION; IS/IS TDV:76160737} safe to resume physical and sexual activity  6.  Health Maintenance - HM due items addressed {Yes or If no, why not?:20788} - Last pap smear  Diagnosis  Date Value Ref Range Status  06/03/2021   Final   - Negative for intraepithelial lesion or malignancy (NILM)   Pap smear {done:10129} at today's visit.  -Breast Cancer screening indicated? {indicated:25516}  7. Chronic Disease/Pregnancy Condition follow up: {Follow up:25499}  - PCP follow up  Guy Begin, CMA [facility] [employer], [facility]

## 2022-01-30 ENCOUNTER — Telehealth: Payer: Self-pay | Admitting: Family Medicine

## 2022-01-30 NOTE — Telephone Encounter (Signed)
Pt concern addressed via [employer].   Julia Armstrong

## 2022-01-30 NOTE — Telephone Encounter (Signed)
Patient missed Postpartum appt but would like to see if she can return to work Monday 02/03/22

## 2022-02-05 ENCOUNTER — Other Ambulatory Visit: Payer: Medicaid Other

## 2022-02-05 ENCOUNTER — Ambulatory Visit: Payer: Medicaid Other | Admitting: Obstetrics and Gynecology

## 2022-02-28 ENCOUNTER — Encounter: Payer: Self-pay | Admitting: Family Medicine

## 2022-02-28 ENCOUNTER — Encounter: Payer: Self-pay | Admitting: Obstetrics and Gynecology

## 2022-02-28 ENCOUNTER — Other Ambulatory Visit (HOSPITAL_COMMUNITY)
Admission: RE | Admit: 2022-02-28 | Discharge: 2022-02-28 | Disposition: A | Discharge status: Home / Self Care | Payer: Medicaid Other | Source: Ambulatory Visit | Attending: Obstetrics and Gynecology | Admitting: Obstetrics and Gynecology

## 2022-02-28 ENCOUNTER — Ambulatory Visit (INDEPENDENT_AMBULATORY_CARE_PROVIDER_SITE_OTHER): Payer: Medicaid Other | Admitting: Obstetrics and Gynecology

## 2022-02-28 ENCOUNTER — Other Ambulatory Visit: Payer: Medicaid Other

## 2022-02-28 VITALS — BP 127/68 | HR 68 | Ht 67.0 in | Wt 246.1 lb

## 2022-02-28 DIAGNOSIS — Z3042 Encounter for surveillance of injectable contraceptive: Secondary | ICD-10-CM | POA: Diagnosis not present

## 2022-02-28 DIAGNOSIS — R7303 Prediabetes: Secondary | ICD-10-CM | POA: Diagnosis not present

## 2022-02-28 DIAGNOSIS — N898 Other specified noninflammatory disorders of vagina: Secondary | ICD-10-CM

## 2022-02-28 DIAGNOSIS — T8332XA Displacement of intrauterine contraceptive device, initial encounter: Secondary | ICD-10-CM | POA: Diagnosis not present

## 2022-02-28 DIAGNOSIS — Z30432 Encounter for removal of intrauterine contraceptive device: Secondary | ICD-10-CM

## 2022-02-28 DIAGNOSIS — O24419 Gestational diabetes mellitus in pregnancy, unspecified control: Secondary | ICD-10-CM

## 2022-02-28 MED ORDER — MEDROXYPROGESTERONE ACETATE 150 MG/ML IM SUSP
150.0000 mg | Freq: Once | INTRAMUSCULAR | Status: AC
  Administered 2022-02-28: 150 mg via INTRAMUSCULAR

## 2022-02-28 NOTE — Progress Notes (Signed)
Julia Armstrong here for Depo-Provera  Injection.  Injection administered without complication. Patient will return in 3 months for next injection.  Henrietta Dine, CMA 02/28/2022  11:25 AM

## 2022-02-28 NOTE — Progress Notes (Signed)
    Post Partum Visit Note  Julia Armstrong is a 31 y.o. 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  Anesthesia: spinal. Postpartum course c/b by pod#6 readmit for severe pre-x based on BPs and for AUB with the IUD with spotting not stopping until a few days ago. Baby is doing well. Baby is feeding by bottle - [employer] Good Start. Bleeding moderate lochia. Bowel function is normal. Bladder function is normal. Patient is sexually active (no problems). Contraception method is IUD. Postpartum depression screening: negative.  She would like the IUD removed today and to do depo provera again. She is not interested in a BTL.   Edinburgh Postnatal Depression Scale - 02/28/22 1057       Edinburgh Postnatal Depression Scale:  In the Past 7 Days   I have been able to laugh and see the funny side of things. 0    I have looked forward with enjoyment to things. 0    I have blamed myself unnecessarily when things went wrong. 0    I have been anxious or worried for no good reason. 0    I have felt scared or panicky for no good reason. 0    Things have been getting on top of me. 0    I have been so unhappy that I have had difficulty sleeping. 0    I have felt sad or miserable. 0    I have been so unhappy that I have been crying. 0    The thought of harming myself has occurred to me. 0    Edinburgh Postnatal Depression Scale Total 0             Review of Systems Pertinent items noted in HPI and remainder of comprehensive ROS otherwise negative.  Objective:  BP 127/68   Pulse 68   Ht 5\' 7"  (1.702 m)   Wt 246 lb 1.6 oz (111.6 kg)   Breastfeeding No   BMI 38.54 kg/m    NAD Abdomen: obese, nttp, soft, non distended. C/d/I incision EGBUS normal Vagina: yellowish d/c but no VB Cervix: normal, no strings seen Bimanual negative  See procedure note for uncomplicated IUD removal.  Assessment:  Normal PP visit  Plan:   *PP: IUD removed and depo provera given today. Needs to be set up with a PCP Last pap smear  Diagnosis  Date Value Ref Range Status  06/03/2021   Final   - Negative for intraepithelial lesion or malignancy (NILM)   *GDMa2: GTT today *PP Severe pre-x: pt stopped her BP meds weeks ago. BPs normal today  RTC 3 months for repeat depo provera  13/01/2021, MD [facility], [facility]

## 2022-02-28 NOTE — Progress Notes (Signed)
Pt concerned that she has been gaining weight with the IUD.

## 2022-03-01 LAB — GLUCOSE TOLERANCE, 2 HOURS
Glucose, 2 hour: 106 mg/dL (ref 70–139)
Glucose, GTT - Fasting: 104 mg/dL — ABNORMAL HIGH (ref 70–99)

## 2022-03-03 ENCOUNTER — Encounter: Payer: Self-pay | Admitting: Obstetrics and Gynecology

## 2022-03-03 HISTORY — PX: IUD REMOVAL: OBO 1004

## 2022-03-03 LAB — CERVICOVAGINAL ANCILLARY ONLY
Bacterial Vaginitis (gardnerella): POSITIVE — AB
Candida Glabrata: NEGATIVE
Candida Vaginitis: NEGATIVE
Comment: NEGATIVE
Comment: NEGATIVE
Comment: NEGATIVE

## 2022-03-03 MED ORDER — METRONIDAZOLE 500 MG PO TABS
500.0000 mg | ORAL_TABLET | Freq: Two times a day (BID) | ORAL | 0 refills | Status: AC
Start: 2022-03-03 — End: 2022-03-10

## 2022-03-03 NOTE — Procedures (Signed)
Intrauterine Device (IUD) Removal Procedure Note  Patient had 5/17 scheduled repeat c-section with Mirena placement. She states she's had AUB with it until a few days ago and the bleeding and spotting stopped. She would like it out and do Depo Provera again. She is not interested in a BTL.  Prior to the procedure being performed, the patient (or guardian) was asked to state their full name, date of birth, and the type of procedure being performed. EGBUS normal. Vaginal vault with yellowish discharge, no VB. Cervix normal with IUD strings NOT seen.  Patient consented for IUD removal attempt. Vaginal swab obtained due to yellowish discharge. Cervix swabbed with betadine and single tenaculum applied to anterior lip. Using the blunt tipped string finder with grooves, the strings were teased out on the second attempt. Then, the strings grasped with ringed forceps and easily removed and noted to be intact.   No complications, patient tolerated the procedure well.  Patient received depo provera today.   Cornelia Copa MD Attending [facility] [employer] (Faculty Practice) 02/28/2022

## 2022-03-03 NOTE — Addendum Note (Signed)
Addended by: [place] Bing on: 03/03/2022 01:04 PM   Modules accepted: Orders

## 2022-03-08 ENCOUNTER — Encounter: Payer: Self-pay | Admitting: Obstetrics and Gynecology

## 2022-04-04 ENCOUNTER — Encounter (HOSPITAL_COMMUNITY): Payer: Self-pay | Admitting: Emergency Medicine

## 2022-04-04 ENCOUNTER — Emergency Department (HOSPITAL_COMMUNITY): Payer: Medicaid Other

## 2022-04-04 ENCOUNTER — Emergency Department (HOSPITAL_COMMUNITY)
Admission: EM | Admit: 2022-04-04 | Discharge: 2022-04-05 | Disposition: A | Discharge status: Home / Self Care | Payer: Medicaid Other | Attending: Emergency Medicine | Admitting: Emergency Medicine

## 2022-04-04 ENCOUNTER — Other Ambulatory Visit: Payer: Self-pay

## 2022-04-04 DIAGNOSIS — R079 Chest pain, unspecified: Secondary | ICD-10-CM | POA: Diagnosis not present

## 2022-04-04 DIAGNOSIS — R1012 Left upper quadrant pain: Secondary | ICD-10-CM | POA: Diagnosis present

## 2022-04-04 DIAGNOSIS — J45909 Unspecified asthma, uncomplicated: Secondary | ICD-10-CM | POA: Insufficient documentation

## 2022-04-04 DIAGNOSIS — R112 Nausea with vomiting, unspecified: Secondary | ICD-10-CM | POA: Diagnosis not present

## 2022-04-04 DIAGNOSIS — R109 Unspecified abdominal pain: Secondary | ICD-10-CM

## 2022-04-04 NOTE — ED Triage Notes (Signed)
Patient reports intermittent pain at mid chest with mild SOB and pain across her abdomen with emesis onset this week .

## 2022-04-05 LAB — CBC
HCT: 37.9 % (ref 36.0–46.0)
Hemoglobin: 12.3 g/dL (ref 12.0–15.0)
MCH: 25.1 pg — ABNORMAL LOW (ref 26.0–34.0)
MCHC: 32.5 g/dL (ref 30.0–36.0)
MCV: 77.3 fL — ABNORMAL LOW (ref 80.0–100.0)
Platelets: 287 10*3/uL (ref 150–400)
RBC: 4.9 MIL/uL (ref 3.87–5.11)
RDW: 15 % (ref 11.5–15.5)
WBC: 6.3 10*3/uL (ref 4.0–10.5)
nRBC: 0 % (ref 0.0–0.2)

## 2022-04-05 LAB — URINALYSIS, ROUTINE W REFLEX MICROSCOPIC
Bacteria, UA: NONE SEEN
Bilirubin Urine: NEGATIVE
Glucose, UA: NEGATIVE mg/dL
Hgb urine dipstick: NEGATIVE
Ketones, ur: NEGATIVE mg/dL
Nitrite: NEGATIVE
Protein, ur: NEGATIVE mg/dL
Specific Gravity, Urine: 1.027 (ref 1.005–1.030)
pH: 5 (ref 5.0–8.0)

## 2022-04-05 LAB — BASIC METABOLIC PANEL
Anion gap: 11 (ref 5–15)
BUN: 11 mg/dL (ref 6–20)
CO2: 23 mmol/L (ref 22–32)
Calcium: 9.1 mg/dL (ref 8.9–10.3)
Chloride: 107 mmol/L (ref 98–111)
Creatinine, Ser: 0.85 mg/dL (ref 0.44–1.00)
GFR, Estimated: >60 mL/min (ref >60)
Glucose, Bld: 130 mg/dL — ABNORMAL HIGH (ref 70–99)
Potassium: 3.7 mmol/L (ref 3.5–5.1)
Sodium: 141 mmol/L (ref 135–145)

## 2022-04-05 LAB — TROPONIN I (HIGH SENSITIVITY)
Troponin I (High Sensitivity): 3 ng/L (ref <18)
Troponin I (High Sensitivity): 3 ng/L (ref <18)

## 2022-04-05 LAB — PREGNANCY, URINE: Preg Test, Ur: NEGATIVE

## 2022-04-05 LAB — LIPASE, BLOOD: Lipase: 43 U/L (ref 11–51)

## 2022-04-05 MED ORDER — FAMOTIDINE 20 MG PO TABS: 20.0000 mg | ORAL_TABLET | Freq: Two times a day (BID) | ORAL | 0 refills | Status: DC

## 2022-04-05 MED ORDER — ONDANSETRON 4 MG PO TBDP
4.0000 mg | ORAL_TABLET | Freq: Once | ORAL | Status: DC
  Filled 2022-04-05: qty 1

## 2022-04-05 MED ORDER — ONDANSETRON 4 MG PO TBDP: 4.0000 mg | ORAL_TABLET | Freq: Three times a day (TID) | ORAL | 0 refills | Status: AC | PRN

## 2022-04-05 MED ORDER — ALUM & MAG HYDROXIDE-SIMETH 200-200-20 MG/5ML PO SUSP
30.0000 mL | Freq: Once | ORAL | Status: AC
  Administered 2022-04-05: 30 mL via ORAL
  Filled 2022-04-05: qty 30

## 2022-04-05 NOTE — Discharge Instructions (Signed)
Take the prescribed medication as directed.  Make sure to stay hydated. Watch intake of spicy/acidic foods as this can worsen acid reflux. Follow-up with your primary care doctor. Return to the ED for new or worsening symptoms.

## 2022-04-05 NOTE — ED Provider Notes (Signed)
[facility] EMERGENCY DEPARTMENT Provider Note   CSN: [id number] Arrival date & time: 04/04/22  2333     History  Chief Complaint  Patient presents with   Abdominal Pain   Chest Pain    Julia Armstrong is a 31 y.o. female.  The history is provided by the patient and medical records.  Abdominal Pain Associated symptoms: chest pain, nausea and vomiting   Chest Pain Associated symptoms: abdominal pain, nausea and vomiting    31 y.o. F with hx of asthma, presenting to the ED with left upper abdominal pain and chest pain.  States ongoing for a few days.  States it feels like "burning" and "gnawing pain".  She does feel some symptoms of acid reflux into the chest/throat with some intermittent vomiting.  Denies fever/chills.  She denies eating spicy/acidic foods, no excessive motrin use.  Denies cardiac hx.  No meds taken PTA.    Home Medications Prior to Admission medications   Medication Sig Start Date End Date Taking? Authorizing Provider  acetaminophen (TYLENOL) 500 MG tablet Take 2 tablets (1,000 mg total) by mouth every 8 (eight) hours as needed (pain). 12/14/21   Worthy Rancher, MD  albuterol (VENTOLIN HFA) 108 (90 Base) MCG/ACT inhaler Inhale 2 puffs into the lungs every 6 (six) hours as needed for wheezing or shortness of breath. Patient not taking: Reported on 12/25/2021 12/15/19   Merrilee Jansky, MD  enalapril (VASOTEC) 5 MG tablet Take 1 tablet (5 mg total) by mouth daily. 12/19/21 01/30/22  [place], MD  ibuprofen (ADVIL) 600 MG tablet Take 1 tablet (600 mg total) by mouth every 6 (six) hours as needed (pain). 12/14/21   Worthy Rancher, MD  oxyCODONE (OXY IR/ROXICODONE) 5 MG immediate release tablet Take 1 tablet (5 mg total) by mouth every 6 (six) hours as needed for severe pain. Patient not taking: Reported on 02/28/2022 12/25/21   Adam Phenix, MD  Prenatal Vit-Fe Fumarate-FA (PRENATAL VITAMIN) 27-0.8 MG TABS Take 1 tablet by mouth  daily. Patient not taking: Reported on 02/28/2022 05/01/21   Donette Larry, CNM      Allergies    Patient has no known allergies.    Review of Systems   Review of Systems  Cardiovascular:  Positive for chest pain.  Gastrointestinal:  Positive for abdominal pain, nausea and vomiting.  All other systems reviewed and are negative.   Physical Exam Updated Vital Signs BP 128/74 (BP Location: Right Arm)   Pulse 71   Temp 98.4 F (36.9 C) (Oral)   Resp 18   SpO2 100%   Physical Exam Vitals and nursing note reviewed.  Constitutional:      Appearance: She is well-developed.  HENT:     Head: Normocephalic and atraumatic.  Eyes:     Conjunctiva/sclera: Conjunctivae normal.     Pupils: Pupils are equal, round, and reactive to light.  Cardiovascular:     Rate and Rhythm: Normal rate and regular rhythm.     Heart sounds: Normal heart sounds.  Pulmonary:     Effort: Pulmonary effort is normal.     Breath sounds: Normal breath sounds.  Abdominal:     General: Bowel sounds are normal.     Palpations: Abdomen is soft.     Tenderness: There is no abdominal tenderness.     Comments: Endorses pain to LUQ but no focal tenderness on exam, no peritoneal signs  Musculoskeletal:        General: Normal range of  motion.     Cervical back: Normal range of motion.  Skin:    General: Skin is warm and dry.  Neurological:     Mental Status: She is alert and oriented to person, place, and time.     ED Results / Procedures / Treatments   Labs (all labs ordered are listed, but only abnormal results are displayed) Labs Reviewed  BASIC METABOLIC PANEL - Abnormal; Notable for the following components:      Result Value   Glucose, Bld 130 (*)    All other components within normal limits  CBC - Abnormal; Notable for the following components:   MCV 77.3 (*)    MCH 25.1 (*)    All other components within normal limits  URINALYSIS, ROUTINE W REFLEX MICROSCOPIC - Abnormal; Notable for the  following components:   Leukocytes,Ua TRACE (*)    All other components within normal limits  LIPASE, BLOOD  PREGNANCY, URINE  HEPATIC FUNCTION PANEL  I-STAT BETA HCG BLOOD, ED ([facility], [facility], [facility] ONLY)  TROPONIN I (HIGH SENSITIVITY)  TROPONIN I (HIGH SENSITIVITY)    EKG None  Radiology DG Chest 2 View  Result Date: 04/05/2022 CLINICAL DATA:  Chest pain. EXAM: CHEST - 2 VIEW COMPARISON:  Chest radiograph dated 12/17/2021. FINDINGS: The heart size and mediastinal contours are within normal limits. Both lungs are clear. The visualized skeletal structures are unremarkable. IMPRESSION: No active cardiopulmonary disease. Electronically Signed   By: Elgie Collard M.D.   On: 04/05/2022 00:25    Procedures Procedures    Medications Ordered in ED Medications  ondansetron (ZOFRAN-ODT) disintegrating tablet 4 mg (4 mg Oral Patient Refused/Not Given 04/05/22 0540)  alum & mag hydroxide-simeth (MAALOX/MYLANTA) 200-200-20 MG/5ML suspension 30 mL (30 mLs Oral Given 04/05/22 0539)    ED Course/ Medical Decision Making/ A&P                           Medical Decision Making Amount and/or Complexity of Data Reviewed Labs: ordered. Radiology: ordered and independent interpretation performed. ECG/medicine tests: ordered and independent interpretation performed.  Risk OTC drugs. Prescription drug management.   31 year old female presenting to the ED with chest and abdominal several days.  States she feels a heartburn-like sensation as well as some "gnawing" pain in left upper abdomen.  No fevers.  No cardiac hx.    Afebrile, non-toxic on my exam.  Abdomen soft, endorses pain to LUQ but no peritoneal signs on exam.  Labs obtained and are reassuring-- no leukocytosis, no electrolyte derangement, trop negative x2.  CXR.  Symptoms sound suspicious for GERD.  Lower suspicion for ACS, PE, dissection, or other acute cardiac event.  Treated here with GI cocktail with improvement.  Appears stable for discharge.   Rx pepcid, zofran.  Encouraged to follow-up with PCP.  Work note given.  Return here for new concerns.  Final Clinical Impression(s) / ED Diagnoses Final diagnoses:  Chest pain, unspecified type  Abdominal pain, unspecified abdominal location    Rx / DC Orders ED Discharge Orders          Ordered    ondansetron (ZOFRAN-ODT) 4 MG disintegrating tablet  Every 8 hours PRN        04/05/22 0607    famotidine (PEPCID) 20 MG tablet  2 times daily        04/05/22 0607              Garlon Hatchet, PA-C 04/05/22 [phone]  Palumbo, April, MD 04/05/22 [phone]

## 2022-04-25 ENCOUNTER — Emergency Department (HOSPITAL_BASED_OUTPATIENT_CLINIC_OR_DEPARTMENT_OTHER): Payer: Medicaid Other

## 2022-04-25 ENCOUNTER — Emergency Department (HOSPITAL_BASED_OUTPATIENT_CLINIC_OR_DEPARTMENT_OTHER)
Admission: EM | Admit: 2022-04-25 | Discharge: 2022-04-25 | Discharge status: Against Medical Advice | Payer: Medicaid Other | Attending: Emergency Medicine | Admitting: Emergency Medicine

## 2022-04-25 ENCOUNTER — Other Ambulatory Visit: Payer: Self-pay

## 2022-04-25 ENCOUNTER — Encounter (HOSPITAL_BASED_OUTPATIENT_CLINIC_OR_DEPARTMENT_OTHER): Payer: Self-pay

## 2022-04-25 DIAGNOSIS — R2241 Localized swelling, mass and lump, right lower limb: Secondary | ICD-10-CM | POA: Insufficient documentation

## 2022-04-25 DIAGNOSIS — Z5321 Procedure and treatment not carried out due to patient leaving prior to being seen by health care provider: Secondary | ICD-10-CM | POA: Diagnosis not present

## 2022-04-25 NOTE — ED Triage Notes (Signed)
Pt presents with RLE pain and swelling x2 days, pt reports it started in her LLE and is now in her RLE  Pt's sister suggest she come in to r/o blood clots, no hx of the same, denies CO/SOB, no recent travel

## 2022-04-25 NOTE — ED Notes (Signed)
Pt advised registration she was leaving.  

## 2022-04-26 ENCOUNTER — Encounter (HOSPITAL_BASED_OUTPATIENT_CLINIC_OR_DEPARTMENT_OTHER): Payer: Self-pay | Admitting: *Deleted

## 2022-04-26 ENCOUNTER — Emergency Department (HOSPITAL_BASED_OUTPATIENT_CLINIC_OR_DEPARTMENT_OTHER)
Admission: EM | Admit: 2022-04-26 | Discharge: 2022-04-26 | Disposition: A | Discharge status: Home / Self Care | Payer: Medicaid Other | Attending: Emergency Medicine | Admitting: Emergency Medicine

## 2022-04-26 ENCOUNTER — Ambulatory Visit (HOSPITAL_BASED_OUTPATIENT_CLINIC_OR_DEPARTMENT_OTHER): Admit: 2022-04-26 | Payer: Medicaid Other

## 2022-04-26 ENCOUNTER — Emergency Department (HOSPITAL_BASED_OUTPATIENT_CLINIC_OR_DEPARTMENT_OTHER): Payer: Medicaid Other

## 2022-04-26 DIAGNOSIS — M79661 Pain in right lower leg: Secondary | ICD-10-CM | POA: Diagnosis present

## 2022-04-26 DIAGNOSIS — R0789 Other chest pain: Secondary | ICD-10-CM | POA: Insufficient documentation

## 2022-04-26 DIAGNOSIS — J45909 Unspecified asthma, uncomplicated: Secondary | ICD-10-CM | POA: Insufficient documentation

## 2022-04-26 DIAGNOSIS — I8001 Phlebitis and thrombophlebitis of superficial vessels of right lower extremity: Secondary | ICD-10-CM | POA: Insufficient documentation

## 2022-04-26 LAB — CBC
HCT: 40.2 % (ref 36.0–46.0)
Hemoglobin: 12.8 g/dL (ref 12.0–15.0)
MCH: 24.6 pg — ABNORMAL LOW (ref 26.0–34.0)
MCHC: 31.8 g/dL (ref 30.0–36.0)
MCV: 77.2 fL — ABNORMAL LOW (ref 80.0–100.0)
Platelets: 292 10*3/uL (ref 150–400)
RBC: 5.21 MIL/uL — ABNORMAL HIGH (ref 3.87–5.11)
RDW: 14.3 % (ref 11.5–15.5)
WBC: 7.2 10*3/uL (ref 4.0–10.5)
nRBC: 0 % (ref 0.0–0.2)

## 2022-04-26 LAB — D-DIMER, QUANTITATIVE: D-Dimer, Quant: 0.29 ug/mL-FEU (ref 0.00–0.50)

## 2022-04-26 LAB — BASIC METABOLIC PANEL
Anion gap: 9 (ref 5–15)
BUN: 9 mg/dL (ref 6–20)
CO2: 26 mmol/L (ref 22–32)
Calcium: 9.4 mg/dL (ref 8.9–10.3)
Chloride: 103 mmol/L (ref 98–111)
Creatinine, Ser: 0.97 mg/dL (ref 0.44–1.00)
GFR, Estimated: >60 mL/min (ref >60)
Glucose, Bld: 114 mg/dL — ABNORMAL HIGH (ref 70–99)
Potassium: 3.6 mmol/L (ref 3.5–5.1)
Sodium: 138 mmol/L (ref 135–145)

## 2022-04-26 LAB — TROPONIN I (HIGH SENSITIVITY)
Troponin I (High Sensitivity): <2 ng/L (ref <18)
Troponin I (High Sensitivity): <2 ng/L (ref <18)

## 2022-04-26 LAB — PREGNANCY, URINE: Preg Test, Ur: NEGATIVE

## 2022-04-26 NOTE — ED Provider Notes (Signed)
[facility] EMERGENCY DEPT Provider Note  CSN: [id number] Arrival date & time: 04/26/22 0001  Chief Complaint(s) Leg Swelling  HPI Julia Armstrong is a 31 y.o. female with a past medical history listed below who presents to the emergency department with right calf pain and tenderness that began 2 days ago.  Pain is worse with light palpation.  She denies any fall or trauma.  She denies any recent travel.  No OCP use.  No prior history of PEs or DVTs.  She is endorsing associated chest discomfort has been ongoing for approximately a week intermittently but constant since yesterday morning.  Nonexertional.  Nonradiating.  No shortness of breath.  No recent fevers or infections.  No coughing or congestion.  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 After c-section. Patient re admitted and incision opened up and wound vac placed.    Patient Active Problem List   Diagnosis Date Noted   Pre-diabetes 10/10/2021   Elevated hemoglobin A1c 08/05/2021   BMI 38.0-38.9,adult 12/01/2017   History of depression 11/03/2017   Home Medication(s) Prior to Admission medications   Medication Sig Start Date End Date Taking? Authorizing Provider  acetaminophen (TYLENOL) 500 MG tablet Take 2 tablets (1,000 mg total) by mouth every 8 (eight) hours as needed (pain). 12/14/21   Worthy Rancher, MD   albuterol (VENTOLIN HFA) 108 (90 Base) MCG/ACT inhaler Inhale 2 puffs into the lungs every 6 (six) hours as needed for wheezing or shortness of breath. Patient not taking: Reported on 12/25/2021 12/15/19   Merrilee Jansky, MD  enalapril (VASOTEC) 5 MG tablet Take 1 tablet (5 mg total) by mouth daily. 12/19/21 01/30/22  [place] Bing, MD  famotidine (PEPCID) 20 MG tablet Take 1 tablet (20 mg total) by mouth 2 (two) times daily. 04/05/22   Garlon Hatchet, PA-C  ibuprofen (ADVIL) 600 MG tablet Take 1 tablet (600 mg total) by mouth every 6 (six) hours as needed (pain). 12/14/21   Worthy Rancher, MD  ondansetron (ZOFRAN-ODT) 4 MG disintegrating tablet Take 1 tablet (4 mg total) by mouth every 8 (eight) hours as needed for nausea. 04/05/22   Garlon Hatchet, PA-C  oxyCODONE (OXY IR/ROXICODONE) 5 MG immediate release tablet Take 1 tablet (5 mg total) by mouth every 6 (six) hours as needed for severe pain. Patient not taking: Reported on 02/28/2022 12/25/21   Adam Phenix, MD  Prenatal Vit-Fe Fumarate-FA (PRENATAL VITAMIN) 27-0.8 MG TABS Take 1 tablet by mouth daily. Patient not taking: Reported on 02/28/2022 05/01/21   Donette Larry, CNM  Allergies Patient has no known allergies.  Review of Systems Review of Systems As noted in HPI  Physical Exam Vital Signs  I have reviewed the triage vital signs BP 128/88 (BP Location: Right Arm)   Pulse 68   Temp 98 F (36.7 C) (Oral)   Resp 18   Wt 106.6 kg   LMP 03/05/2022 (Approximate)   SpO2 99%   Breastfeeding No   BMI 36.81 kg/m   Physical Exam Vitals reviewed.  Constitutional:      General: She is not in acute distress.    Appearance: She is well-developed. She is obese. She is not diaphoretic.  HENT:     Head: Normocephalic and atraumatic.     Nose: Nose normal.  Eyes:     General: No scleral icterus.        Right eye: No discharge.        Left eye: No discharge.     Conjunctiva/sclera: Conjunctivae normal.     Pupils: Pupils are equal, round, and reactive to light.  Cardiovascular:     Rate and Rhythm: Normal rate and regular rhythm.     Heart sounds: No murmur heard.    No friction rub. No gallop.  Pulmonary:     Effort: Pulmonary effort is normal. No respiratory distress.     Breath sounds: Normal breath sounds. No stridor. No rales.  Abdominal:     General: There is no distension.     Palpations: Abdomen is soft.     Tenderness: There is no abdominal tenderness.  Musculoskeletal:        General: No tenderness.     Cervical back: Normal range of motion and neck supple.       Legs:  Skin:    General: Skin is warm and dry.     Findings: No erythema or rash.  Neurological:     Mental Status: She is alert and oriented to person, place, and time.     ED Results and Treatments Labs (all labs ordered are listed, but only abnormal results are displayed) Labs Reviewed  BASIC METABOLIC PANEL - Abnormal; Notable for the following components:      Result Value   Glucose, Bld 114 (*)    All other components within normal limits  CBC - Abnormal; Notable for the following components:   RBC 5.21 (*)    MCV 77.2 (*)    MCH 24.6 (*)    All other components within normal limits  PREGNANCY, URINE  D-DIMER, QUANTITATIVE  TROPONIN I (HIGH SENSITIVITY)  TROPONIN I (HIGH SENSITIVITY)                                                                                                                         EKG  EKG Interpretation  Date/Time:  Saturday April 26 2022 00:28:29 EDT Ventricular Rate:  87 PR Interval:  178 QRS Duration: 88 QT Interval:  342 QTC Calculation: 411 R Axis:   49 Text Interpretation: Normal  sinus rhythm Normal ECG When compared with ECG of 05-Apr-2022 05:39, PREVIOUS ECG IS PRESENT Confirmed by Drema Pry [phone]) on 04/26/2022 4:33:49 AM        Radiology DG Chest Port 1 View  Result Date: 04/26/2022 CLINICAL DATA:  Chest pain EXAM: PORTABLE CHEST 1 VIEW COMPARISON:  04/04/2022 FINDINGS: Cardiac shadow is within normal limits. Radiopaque foreign body is noted over the left lung base not seen on the prior exam and likely extrinsic to the patient. No focal infiltrate is seen. No bony abnormality is noted. IMPRESSION: No acute abnormality noted. Density over the left base likely extrinsic to the patient Electronically Signed   By: Alcide Clever M.D.   On: 04/26/2022 02:28    Medications Ordered in ED Medications - No data to display                                                                                                                                   Procedures Procedures  (including critical care time)  Medical Decision Making / ED Course   Medical Decision Making Amount and/or Complexity of Data Reviewed Labs: ordered. Decision-making details documented in ED Course. Radiology: ordered and independent interpretation performed. Decision-making details documented in ED Course. ECG/medicine tests: ordered and independent interpretation performed. Decision-making details documented in ED Course.    Chest pain Atypical for ACS Heart goal less than 3 EKG without acute ischemic changes or evidence of pericarditis Serial troponins negative. Sufficient to rule out ACS.  Low concern for PE but given the right calf pain, dimer obtained and negative.  No need for CTA at this time. Presentation not classic for aortic dissection or esophageal perforation.  Chest x-ray without pneumonia, pneumothorax, pulmonary edema or pleural effusions.  Right calf pain. Tender palpable cord most suspicious for superficial thrombophlebitis.  No evidence of cellulitis.  Doubt DVT and dimer negative palpation we will like to confirm with ultrasound.  At this time ultrasound unavailable.  Order for study was placed upon her return in the  morning.       Final Clinical Impression(s) / ED Diagnoses Final diagnoses:  Chest discomfort  Right calf pain  Thrombophlebitis of superficial veins of right lower extremity   The patient appears reasonably screened and/or stabilized for discharge and I doubt any other medical condition or other [facility] requiring further screening, evaluation, or treatment in the ED at this time. I have discussed the findings, Dx and Tx plan with the patient/family who expressed understanding and agree(s) with the plan. Discharge instructions discussed at length. The patient/family was given strict return precautions who verbalized understanding of the instructions. No further questions at time of discharge.  Disposition: Discharge  Condition: Good  ED Discharge Orders          Ordered    US Venous Img Lower Unilateral Right        04/26/22 0556  Follow Up: Primary care provider  Call  to schedule an appointment for close follow up           This chart was dictated using voice recognition software.  Despite best efforts to proofread,  errors can occur which can change the documentation meaning.    Fatima Blank, MD 04/26/22 [phone]

## 2022-04-26 NOTE — ED Triage Notes (Signed)
Pt is here for right calf pain and swelling which began Thursday.  Pt is also having chest pain with this. No recent travel or hospitalization.  Pt had a baby 12/11/2021

## 2022-04-27 ENCOUNTER — Ambulatory Visit (HOSPITAL_BASED_OUTPATIENT_CLINIC_OR_DEPARTMENT_OTHER)
Admission: RE | Admit: 2022-04-27 | Discharge: 2022-04-27 | Disposition: A | Discharge status: Home / Self Care | Payer: Medicaid Other | Source: Ambulatory Visit | Attending: Emergency Medicine | Admitting: Emergency Medicine

## 2022-04-27 ENCOUNTER — Encounter (HOSPITAL_BASED_OUTPATIENT_CLINIC_OR_DEPARTMENT_OTHER): Payer: Self-pay | Admitting: Radiology

## 2022-04-27 DIAGNOSIS — M79604 Pain in right leg: Secondary | ICD-10-CM | POA: Insufficient documentation

## 2022-04-27 DIAGNOSIS — M7989 Other specified soft tissue disorders: Secondary | ICD-10-CM | POA: Insufficient documentation

## 2022-05-06 ENCOUNTER — Ambulatory Visit: Payer: Medicaid Other

## 2022-05-16 ENCOUNTER — Ambulatory Visit: Payer: Medicaid Other

## 2022-05-19 ENCOUNTER — Emergency Department (HOSPITAL_BASED_OUTPATIENT_CLINIC_OR_DEPARTMENT_OTHER)
Admission: EM | Admit: 2022-05-19 | Discharge: 2022-05-19 | Disposition: A | Discharge status: Home / Self Care | Payer: Medicaid Other | Attending: Emergency Medicine | Admitting: Emergency Medicine

## 2022-05-19 ENCOUNTER — Other Ambulatory Visit: Payer: Self-pay

## 2022-05-19 ENCOUNTER — Encounter (HOSPITAL_BASED_OUTPATIENT_CLINIC_OR_DEPARTMENT_OTHER): Payer: Self-pay | Admitting: Emergency Medicine

## 2022-05-19 DIAGNOSIS — J45909 Unspecified asthma, uncomplicated: Secondary | ICD-10-CM | POA: Diagnosis not present

## 2022-05-19 DIAGNOSIS — M5442 Lumbago with sciatica, left side: Secondary | ICD-10-CM

## 2022-05-19 DIAGNOSIS — M545 Low back pain, unspecified: Secondary | ICD-10-CM | POA: Diagnosis present

## 2022-05-19 LAB — PREGNANCY, URINE: Preg Test, Ur: NEGATIVE

## 2022-05-19 LAB — URINALYSIS, ROUTINE W REFLEX MICROSCOPIC
Bilirubin Urine: NEGATIVE
Glucose, UA: NEGATIVE mg/dL
Hgb urine dipstick: NEGATIVE
Ketones, ur: NEGATIVE mg/dL
Leukocytes,Ua: NEGATIVE
Nitrite: NEGATIVE
Protein, ur: NEGATIVE mg/dL
Specific Gravity, Urine: 1.029 (ref 1.005–1.030)
pH: 6 (ref 5.0–8.0)

## 2022-05-19 MED ORDER — CYCLOBENZAPRINE HCL 5 MG PO TABS: 5.0000 mg | ORAL_TABLET | Freq: Two times a day (BID) | ORAL | 0 refills | Status: DC | PRN

## 2022-05-19 MED ORDER — IBUPROFEN 600 MG PO TABS: 600.0000 mg | ORAL_TABLET | Freq: Four times a day (QID) | ORAL | 0 refills | Status: DC | PRN

## 2022-05-19 MED ORDER — CYCLOBENZAPRINE HCL 5 MG PO TABS
5.0000 mg | ORAL_TABLET | Freq: Once | ORAL | Status: AC
Start: 2022-05-19 — End: 2022-05-19
  Administered 2022-05-19: 5 mg via ORAL
  Filled 2022-05-19: qty 1

## 2022-05-19 MED ORDER — KETOROLAC TROMETHAMINE 30 MG/ML IJ SOLN
30.0000 mg | Freq: Once | INTRAMUSCULAR | Status: AC
  Administered 2022-05-19: 30 mg via INTRAMUSCULAR
  Filled 2022-05-19: qty 1

## 2022-05-19 NOTE — Discharge Instructions (Addendum)
You were seen today for back pain.  Start 600 mg of ibuprofen.  You may take muscle relaxers as needed.  Do not drive while taking muscle relaxers.

## 2022-05-19 NOTE — ED Provider Notes (Signed)
[facility] EMERGENCY DEPT Provider Note   CSN: [id number] Arrival date & time: 05/19/22  0002     History  Chief Complaint  Patient presents with   Back Pain    Julia Armstrong is a 31 y.o. female.  HPI     This is a 31 year old female who presents with back pain.  Patient reports 1 to 2-week history of worsening left-sided back pain.  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  Pain is worse with certain movements.  She states pain sometimes radiates into her left leg.  She reports today when she went to stand up she felt weak secondary to pain.  She denies any bowel or bladder difficulty.  Denies numbness or tingling of the lower extremities.  He has not taken anything for her pain.  Denies history of cancer, IV drug use, fevers.  Home Medications Prior to Admission medications   Medication Sig Start Date End Date Taking? Authorizing Provider  cyclobenzaprine (FLEXERIL) 5 MG tablet Take 1 tablet (5 mg total) by mouth 2 (two) times daily as needed for muscle spasms. 05/19/22  Yes Wavie Hashimi, Mayer Masker, MD  ibuprofen (ADVIL) 600 MG tablet Take 1 tablet (600 mg total) by mouth every 6 (six) hours as needed. 05/19/22  Yes Tania Perrott, Mayer Masker, MD  acetaminophen (TYLENOL) 500 MG tablet Take 2 tablets (1,000 mg total) by mouth every 8 (eight) hours as needed (pain). 12/14/21   Worthy Rancher, MD  albuterol (VENTOLIN HFA) 108 (90 Base) MCG/ACT inhaler Inhale 2 puffs into the lungs every 6 (six) hours as needed for wheezing or shortness of breath. Patient not taking: Reported on 12/25/2021 12/15/19   Merrilee Jansky, MD  enalapril (VASOTEC) 5 MG tablet Take 1 tablet (5 mg total) by mouth daily. 12/19/21 01/30/22  [place] Bing, MD  famotidine (PEPCID) 20 MG tablet Take 1 tablet (20 mg total) by mouth 2 (two) times daily. 04/05/22   Garlon Hatchet, PA-C  ibuprofen (ADVIL) 600 MG tablet Take 1 tablet (600 mg total) by mouth every 6 (six) hours as  needed (pain). 12/14/21   Worthy Rancher, MD  ondansetron (ZOFRAN-ODT) 4 MG disintegrating tablet Take 1 tablet (4 mg total) by mouth every 8 (eight) hours as needed for nausea. 04/05/22   Garlon Hatchet, PA-C  oxyCODONE (OXY IR/ROXICODONE) 5 MG immediate release tablet Take 1 tablet (5 mg total) by mouth every 6 (six) hours as needed for severe pain. Patient not taking: Reported on 02/28/2022 12/25/21   Adam Phenix, MD  Prenatal Vit-Fe Fumarate-FA (PRENATAL VITAMIN) 27-0.8 MG TABS Take 1 tablet by mouth daily. Patient not taking: Reported on 02/28/2022 05/01/21   Donette Larry, CNM      Allergies    Patient has no known allergies.    Review of Systems   Review of Systems  Constitutional:  Negative for fever.  Genitourinary:  Negative for difficulty urinating.  Musculoskeletal:  Positive for back pain.  Neurological:  Negative for weakness.  All other systems reviewed and are negative.   Physical Exam Updated Vital Signs BP 138/81 (BP Location: Right Arm)   Pulse 71   Temp 98.2 F (36.8 C) (Oral)   Resp 18   LMP 03/05/2022 (Approximate)   SpO2 100%  Physical Exam Vitals and nursing note reviewed.  Constitutional:      Appearance: She is well-developed. She is obese.  HENT:     Head: Normocephalic and atraumatic.  Eyes:  Pupils: Pupils are equal, round, and reactive to light.  Cardiovascular:     Rate and Rhythm: Normal rate and regular rhythm.  Pulmonary:     Effort: Pulmonary effort is normal. No respiratory distress.  Abdominal:     Palpations: Abdomen is soft.     Tenderness: There is no abdominal tenderness.  Musculoskeletal:     Cervical back: Neck supple.     Comments: Tenderness palpation left lower lumbar region in the paraspinous muscle region into the piriformis, no midline step-off or deformity noted, negative straight leg raise  Skin:    General: Skin is warm and dry.  Neurological:     Mental Status: She is alert and oriented to person, place, and  time.     Comments: 5 out of 5 strength bilateral lower extremities, normal reflexes bilaterally  Psychiatric:        Mood and Affect: Mood normal.     ED Results / Procedures / Treatments   Labs (all labs ordered are listed, but only abnormal results are displayed) Labs Reviewed  URINALYSIS, ROUTINE W REFLEX MICROSCOPIC  PREGNANCY, URINE    EKG None  Radiology No results found.  Procedures Procedures    Medications Ordered in ED Medications  ketorolac (TORADOL) 30 MG/ML injection 30 mg (30 mg Intramuscular Given 05/19/22 0304)  cyclobenzaprine (FLEXERIL) tablet 5 mg (5 mg Oral Given 05/19/22 0304)    ED Course/ Medical Decision Making/ A&P                           Medical Decision Making Amount and/or Complexity of Data Reviewed Labs: ordered.  Risk Prescription drug management.   This patient presents to the ED for concern of back pain, this involves an extensive number of treatment options, and is a complaint that carries with it a high risk of complications and morbidity.  I considered the following differential and admission for this acute, potentially life threatening condition.  The differential diagnosis includes musculoskeletal strain, sciatica, less likely fracture or epidural hematoma  MDM:    This is a 31 year old female who presents with back pain.  Ongoing for the last 2 weeks.  She is nontoxic and vital signs are reassuring.  She has no red flags and her neurologic exam is normal.  No signs or symptoms of cauda equina.   Patient has not taken anything for her symptoms.  Will trial anti-inflammatories and muscle relaxant.  Patient was given a dose of Toradol and Flexeril.  Do not feel she needs imaging at this time as I have low suspicion for fracture.  (Labs, imaging, consults)  Labs: I Ordered, and personally interpreted labs.  The pertinent results include: None  Imaging Studies ordered: I ordered imaging studies including none I independently  visualized and interpreted imaging. I agree with the radiologist interpretation  Additional history obtained from chart review.  External records from outside source obtained and reviewed including prior evaluations  Cardiac Monitoring: The patient was maintained on a cardiac monitor.  I personally viewed and interpreted the cardiac monitored which showed an underlying rhythm of: Sinus rhythm  Reevaluation: After the interventions noted above, I reevaluated the patient and found that they have :improved  Social Determinants of Health: As independently  Disposition: Discharge  Co morbidities that complicate the patient evaluation  Past Medical History:  Diagnosis Date   Asthma    Drug use 02/26/2018   UDS + for cocaine and MJ in 2018, no other  UDS done since then UDS done after syncopal episode 02/26/18 showed MJ (pt denies any recent use)   Gallstones    Gestational diabetes    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 After c-section. Patient re admitted and incision opened up and wound vac placed.      Medicines Meds ordered this encounter  Medications   ketorolac (TORADOL) 30 MG/ML injection 30 mg   cyclobenzaprine (FLEXERIL) tablet 5 mg   ibuprofen (ADVIL) 600 MG tablet    Sig: Take 1 tablet (600 mg total) by mouth every 6 (six) hours as needed.    Dispense:  30 tablet    Refill:  0   cyclobenzaprine (FLEXERIL) 5 MG tablet    Sig: Take 1 tablet (5 mg total) by mouth 2 (two) times daily as needed for muscle spasms.    Dispense:  10 tablet    Refill:  0    I have reviewed the patients home medicines and have made adjustments as needed  Problem List / ED  Course: Problem List Items Addressed This Visit   None Visit Diagnoses     Acute left-sided low back pain with left-sided sciatica    -  Primary   Relevant Medications   ketorolac (TORADOL) 30 MG/ML injection 30 mg (Completed)   cyclobenzaprine (FLEXERIL) tablet 5 mg (Completed)   ibuprofen (ADVIL) 600 MG tablet   cyclobenzaprine (FLEXERIL) 5 MG tablet                   Final Clinical Impression(s) / ED Diagnoses Final diagnoses:  Acute left-sided low back pain with left-sided sciatica    Rx / DC Orders ED Discharge Orders          Ordered    ibuprofen (ADVIL) 600 MG tablet  Every 6 hours PRN        05/19/22 0305    cyclobenzaprine (FLEXERIL) 5 MG tablet  2 times daily PRN        05/19/22 0305              Shon Baton, MD 05/19/22 0310

## 2022-05-19 NOTE — ED Triage Notes (Signed)
Patient bib ems for lower back pain from home, started 2 weeks ago, today worse and unable to stand. Reports weakness and pain in both legs.  Also hit left knee on car door earlier today, lac and swelling.  Reports urine frequency   Ems VS 140/100 98%RA 16 85hr

## 2022-09-09 IMAGING — US US OB COMP LESS 14 WK
2 series · 15 of 28 positions shown · non-contrast
Comparison: Prior ultrasound from 05/01/2021.

CLINICAL DATA: Initial evaluation for early pregnancy, viability.

EXAM:
OBSTETRIC <14 WK US AND TRANSVAGINAL OB US
TECHNIQUE: Both transabdominal and transvaginal ultrasound examinations were
performed for complete evaluation of the gestation as well as the
maternal uterus, adnexal regions, and pelvic cul-de-sac.
Transvaginal technique was performed to assess early pregnancy.

[Series 1: us ob comp less 14 wk · 14 of 41 slices shown (1 of 2)]
[im 1/41]
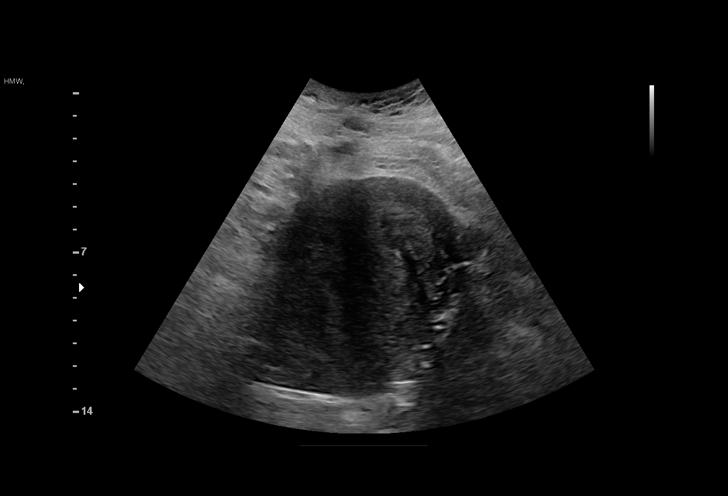
[im 4/41]
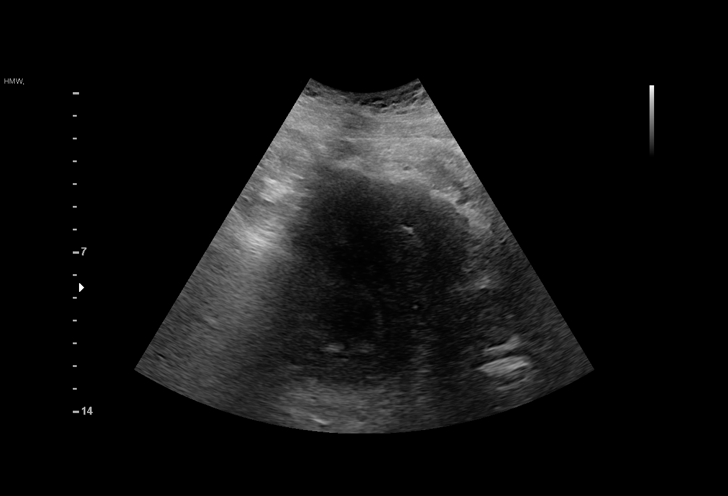
[im 7/41]
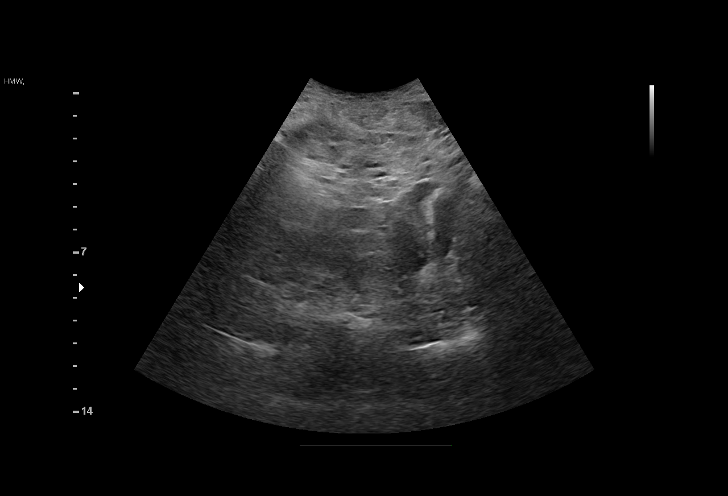
[im 10/41]
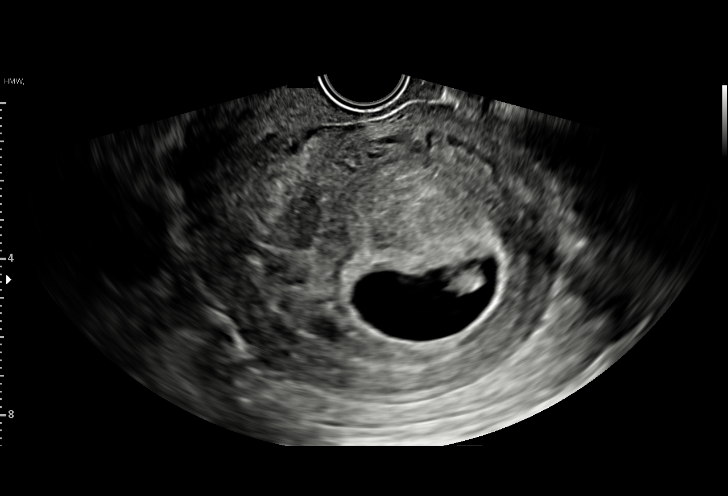
[im 13/41]
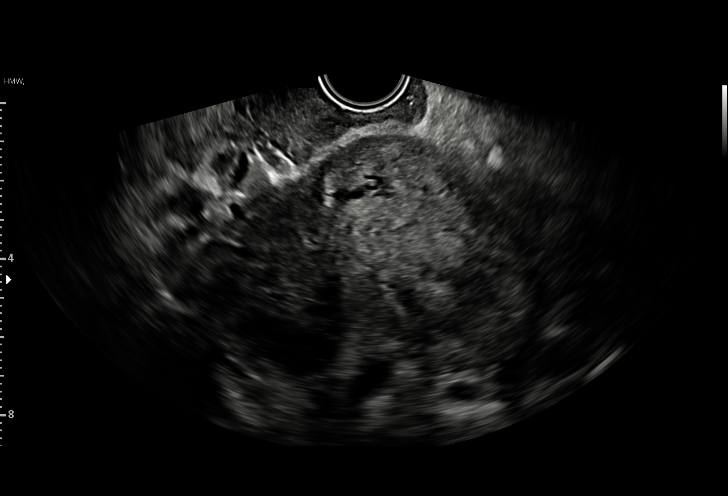
[im 16/41]
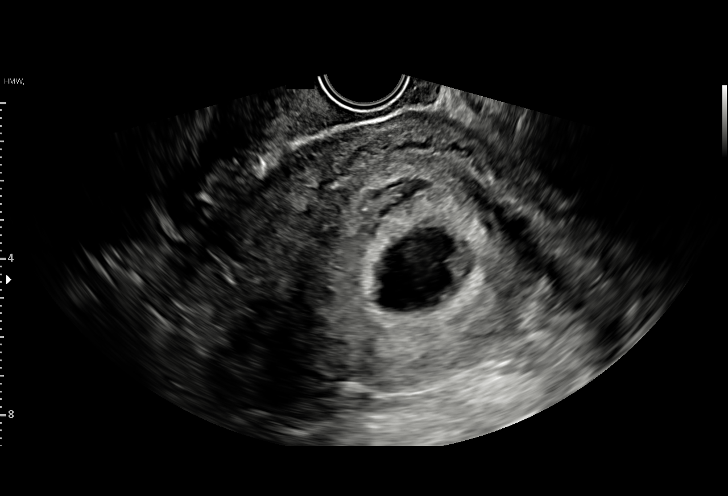
[im 19/41]
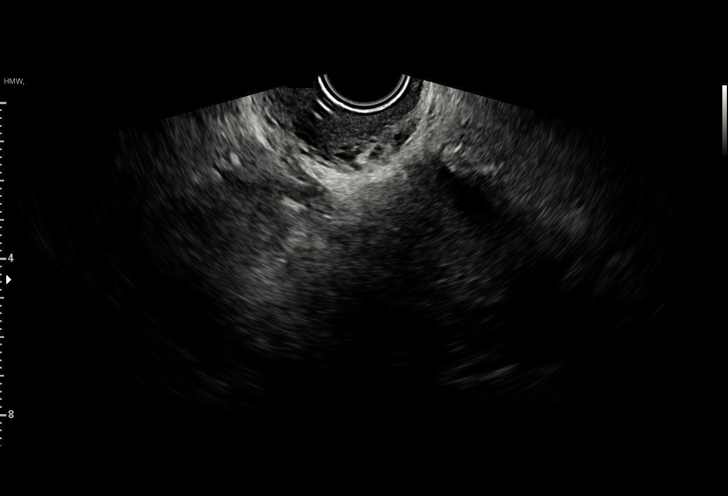
[im 22/41]
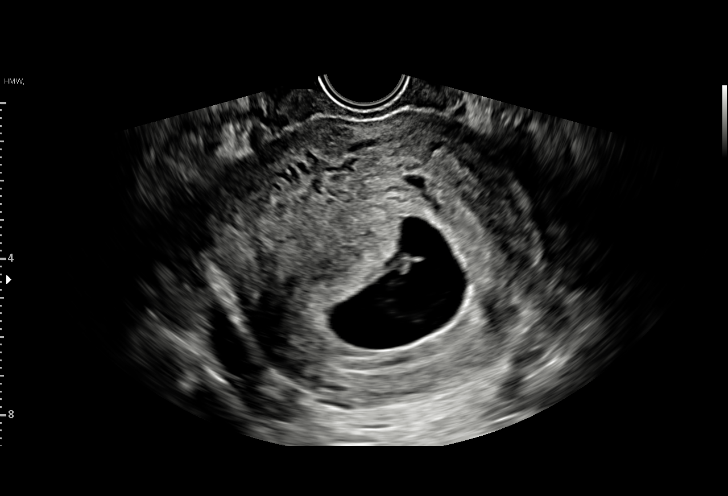
[im 24/41]
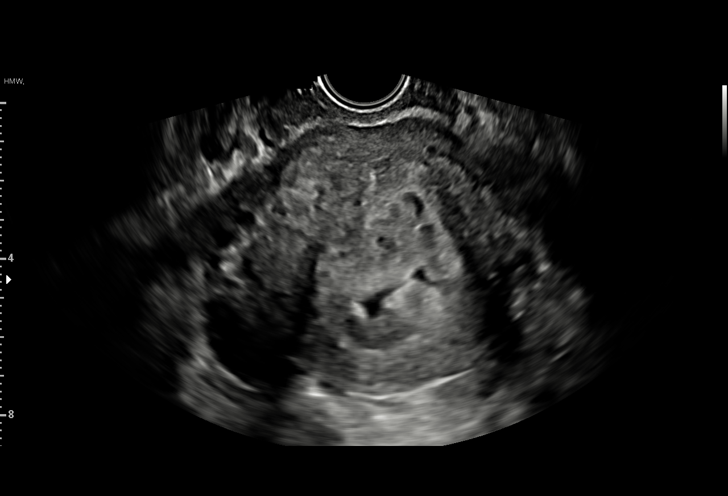
[im 27/41]
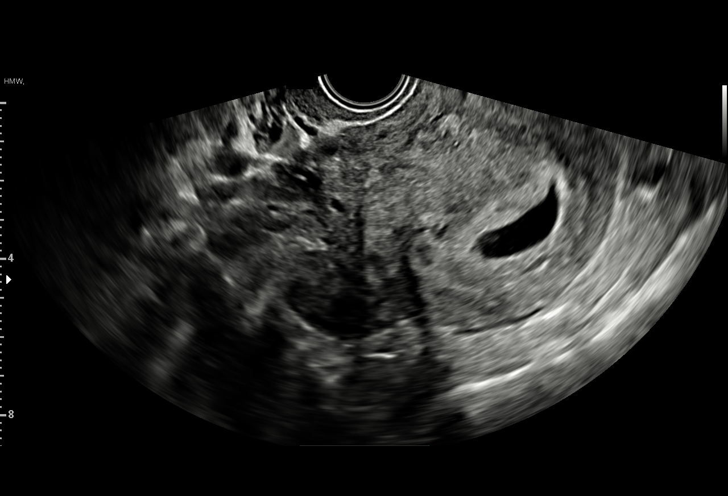
[im 30/41]
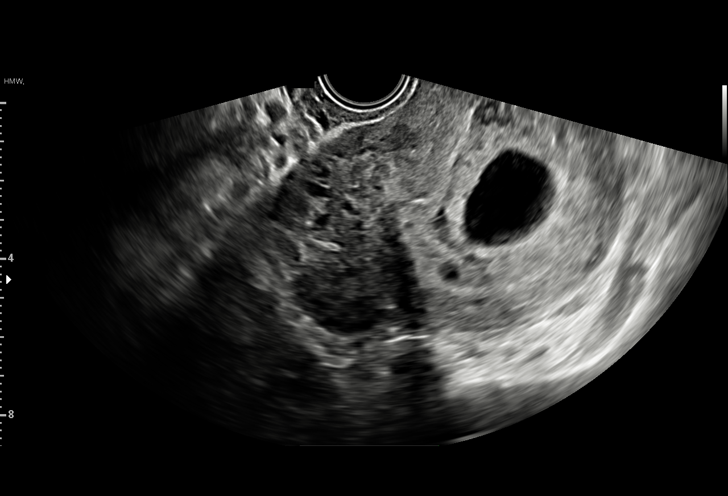
[im 33/41]
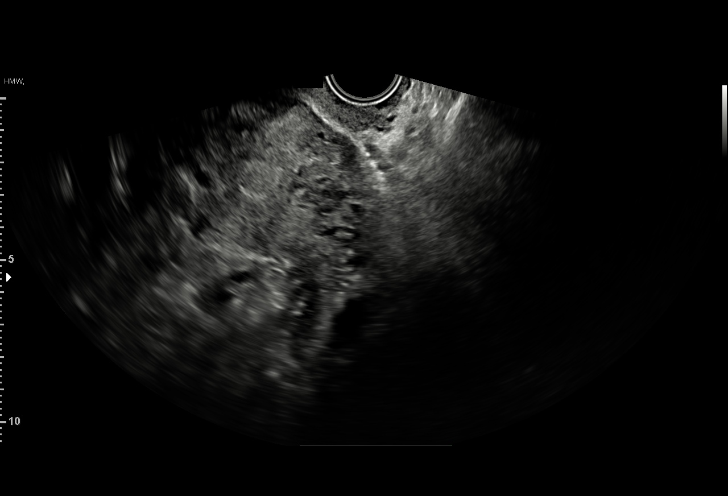
[im 36/41]
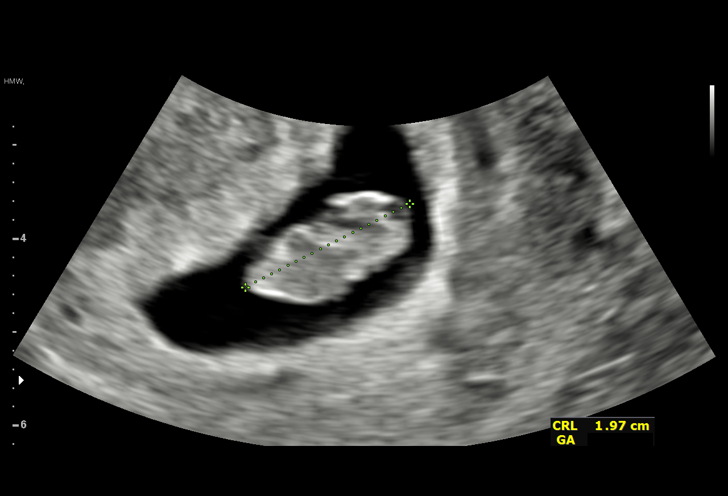
[im 39/41]
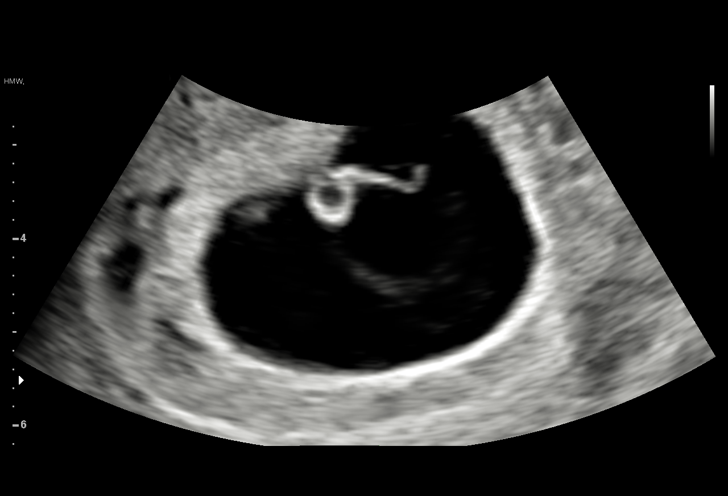

[Series 3: us ob comp less 14 wk · 1 of 1 slices shown (2 of 2)]
[im 1/1]
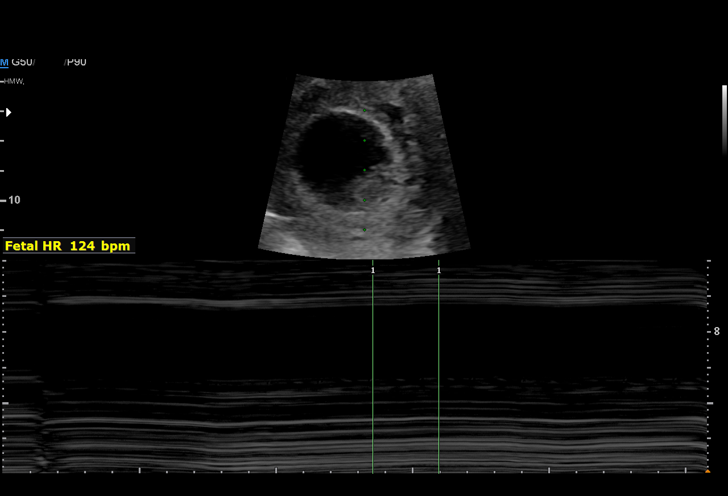

[15 of 28 positions shown; findings below may reference images not displayed]

FINDINGS: Intrauterine gestational sac: Single

Yolk sac:  Present

Embryo:  Present

Cardiac Activity: Present

Heart Rate: 124 bpm

CRL:  19.5 mm   8 w   3 d                  US EDC: 01/01/2022

Subchorionic hemorrhage:  None visualized.

Maternal uterus/adnexae: Right ovary within normal limits. Left
ovary not seen. No adnexal mass or free fluid.
IMPRESSION: 1. Single viable intrauterine pregnancy, estimated gestational age 8
weeks and 3 days by crown-rump length, with ultrasound EDC of
01/01/2022. No complication.
2. No other acute maternal uterine or adnexal abnormality.

## 2022-11-17 ENCOUNTER — Ambulatory Visit: Payer: Medicaid Other | Admitting: Family Medicine

## 2022-12-08 ENCOUNTER — Emergency Department (HOSPITAL_COMMUNITY)
Admission: EM | Admit: 2022-12-08 | Discharge: 2022-12-08 | Disposition: A | Discharge status: Home / Self Care | Payer: Medicaid Other | Attending: Emergency Medicine | Admitting: Emergency Medicine

## 2022-12-08 ENCOUNTER — Emergency Department (HOSPITAL_COMMUNITY): Payer: Medicaid Other

## 2022-12-08 ENCOUNTER — Encounter (HOSPITAL_COMMUNITY): Payer: Self-pay

## 2022-12-08 ENCOUNTER — Other Ambulatory Visit: Payer: Self-pay

## 2022-12-08 DIAGNOSIS — W19XXXA Unspecified fall, initial encounter: Secondary | ICD-10-CM

## 2022-12-08 DIAGNOSIS — W172XXA Fall into hole, initial encounter: Secondary | ICD-10-CM | POA: Diagnosis not present

## 2022-12-08 DIAGNOSIS — S8011XA Contusion of right lower leg, initial encounter: Secondary | ICD-10-CM | POA: Insufficient documentation

## 2022-12-08 DIAGNOSIS — S8991XA Unspecified injury of right lower leg, initial encounter: Secondary | ICD-10-CM | POA: Diagnosis present

## 2022-12-08 MED ORDER — IBUPROFEN 400 MG PO TABS
400.0000 mg | ORAL_TABLET | Freq: Once | ORAL | Status: AC
  Administered 2022-12-08: 400 mg via ORAL
  Filled 2022-12-08: qty 1

## 2022-12-08 NOTE — Discharge Instructions (Signed)
Your x-rays did not show evidence of a broken bone. Use Tylenol every 4 hours and ibuprofen every 6 hours needed for pain.  Ice and elevate as needed. Return for new concerns.

## 2022-12-08 NOTE — ED Notes (Signed)
Ortho called for crutches per patients request

## 2022-12-08 NOTE — ED Triage Notes (Signed)
Patient states she fell in manhole late Friday night with pain to lower right leg and ankle, no meds prior to arrival, abrasion to tibia area noted

## 2022-12-08 NOTE — ED Provider Notes (Signed)
[place] EMERGENCY DEPARTMENT AT [facility] Provider Note   CSN: [id number] Arrival date & time: 12/08/22  1237     History  Chief Complaint  Patient presents with   Leg Injury    Julia Armstrong is a 32 y.o. female.  Patient presents with persistent right leg and ankle pain since falling in a hole Friday evening.  Pain is improved and swelling is improved however pain persist.  Patient can put weight on it.  No concern for infection at this time.  No other injuries.       Home Medications Prior to Admission medications   Medication Sig Start Date End Date Taking? Authorizing Provider  acetaminophen (TYLENOL) 500 MG tablet Take 2 tablets (1,000 mg total) by mouth every 8 (eight) hours as needed (pain). 12/14/21   Worthy Rancher, MD  albuterol (VENTOLIN HFA) 108 (90 Base) MCG/ACT inhaler Inhale 2 puffs into the lungs every 6 (six) hours as needed for wheezing or shortness of breath. Patient not taking: Reported on 12/25/2021 12/15/19   Merrilee Jansky, MD  cyclobenzaprine (FLEXERIL) 5 MG tablet Take 1 tablet (5 mg total) by mouth 2 (two) times daily as needed for muscle spasms. 05/19/22   Horton, Mayer Masker, MD  enalapril (VASOTEC) 5 MG tablet Take 1 tablet (5 mg total) by mouth daily. 12/19/21 01/30/22  [place] Bing, MD  famotidine (PEPCID) 20 MG tablet Take 1 tablet (20 mg total) by mouth 2 (two) times daily. 04/05/22   Garlon Hatchet, PA-C  ibuprofen (ADVIL) 600 MG tablet Take 1 tablet (600 mg total) by mouth every 6 (six) hours as needed (pain). 12/14/21   Worthy Rancher, MD  ibuprofen (ADVIL) 600 MG tablet Take 1 tablet (600 mg total) by mouth every 6 (six) hours as needed. 05/19/22   Horton, Mayer Masker, MD  ondansetron (ZOFRAN-ODT) 4 MG disintegrating tablet Take 1 tablet (4 mg total) by mouth every 8 (eight) hours as needed for nausea. 04/05/22   Garlon Hatchet, PA-C  oxyCODONE (OXY IR/ROXICODONE) 5 MG immediate release tablet Take 1 tablet (5 mg total)  by mouth every 6 (six) hours as needed for severe pain. Patient not taking: Reported on 02/28/2022 12/25/21   Adam Phenix, MD  Prenatal Vit-Fe Fumarate-FA (PRENATAL VITAMIN) 27-0.8 MG TABS Take 1 tablet by mouth daily. Patient not taking: Reported on 02/28/2022 05/01/21   Donette Larry, CNM      Allergies    Patient has no known allergies.    Review of Systems   Review of Systems  Constitutional:  Negative for chills and fever.  HENT:  Negative for congestion.   Eyes:  Negative for visual disturbance.  Respiratory:  Negative for shortness of breath.   Cardiovascular:  Negative for chest pain.  Gastrointestinal:  Negative for abdominal pain and vomiting.  Genitourinary:  Negative for dysuria and flank pain.  Musculoskeletal:  Negative for back pain, neck pain and neck stiffness.  Skin:  Positive for wound. Negative for rash.  Neurological:  Negative for light-headedness and headaches.    Physical Exam Updated Vital Signs BP 120/66 (BP Location: Right Arm)   Pulse 94   Temp 98.5 F (36.9 C) (Oral)   Resp 18   Wt 108.9 kg Comment: verified by patient  LMP 12/02/2022 (Approximate)   SpO2 100%   BMI 37.59 kg/m  Physical Exam Vitals and nursing note reviewed.  Constitutional:      General: She is not in acute distress.  Appearance: She is well-developed.  HENT:     Head: Normocephalic and atraumatic.     Mouth/Throat:     Mouth: Mucous membranes are moist.  Eyes:     General:        Right eye: No discharge.        Left eye: No discharge.     Conjunctiva/sclera: Conjunctivae normal.  Neck:     Trachea: No tracheal deviation.  Cardiovascular:     Rate and Rhythm: Normal rate.  Pulmonary:     Effort: Pulmonary effort is normal.  Abdominal:     General: There is no distension.     Palpations: Abdomen is soft.     Tenderness: There is no abdominal tenderness. There is no guarding.  Musculoskeletal:        General: Swelling and tenderness present. No deformity.      Cervical back: Normal range of motion and neck supple. No rigidity.     Comments: Patient has mild tenderness and swelling right proximal tibia with superficial abrasion healing well, mild tenderness lateral malleolus on the right as well.  Compartments soft.  Skin:    General: Skin is warm.     Capillary Refill: Capillary refill takes less than 2 seconds.     Findings: No rash.  Neurological:     General: No focal deficit present.     Mental Status: She is alert.  Psychiatric:        Mood and Affect: Mood normal.     ED Results / Procedures / Treatments   Labs (all labs ordered are listed, but only abnormal results are displayed) Labs Reviewed - No data to display  EKG None  Radiology DG Ankle Complete Right  Result Date: 12/08/2022 CLINICAL DATA:  Right lower extremity injury after fall EXAM: RIGHT ANKLE - COMPLETE 3+ VIEW; RIGHT TIBIA AND FIBULA - 2 VIEW COMPARISON:  None Available. FINDINGS: There is no evidence of fracture or dislocation of the right ankle or right tibia-fibula. There is no evidence of arthropathy or other focal bone abnormality. Soft tissues are unremarkable. IMPRESSION: Negative. Electronically Signed   By: Duanne Guess D.O.   On: 12/08/2022 13:52   DG Tibia/Fibula Right  Result Date: 12/08/2022 CLINICAL DATA:  Right lower extremity injury after fall EXAM: RIGHT ANKLE - COMPLETE 3+ VIEW; RIGHT TIBIA AND FIBULA - 2 VIEW COMPARISON:  None Available. FINDINGS: There is no evidence of fracture or dislocation of the right ankle or right tibia-fibula. There is no evidence of arthropathy or other focal bone abnormality. Soft tissues are unremarkable. IMPRESSION: Negative. Electronically Signed   By: Duanne Guess D.O.   On: 12/08/2022 13:52    Procedures Procedures    Medications Ordered in ED Medications  ibuprofen (ADVIL) tablet 400 mg (400 mg Oral Given 12/08/22 1257)    ED Course/ Medical Decision Making/ A&P                             Medical  Decision Making Amount and/or Complexity of Data Reviewed Radiology: ordered.  Risk Prescription drug management.   Presents with isolated right tibia and fibula tenderness.  X-ray ordered independently reviewed no acute fractures.  Plan for ice, ibuprofen as needed and supportive care.  Patient comfortable plan.  X-ray independently reviewed no acute fracture.  Patient stable for discharge.        Final Clinical Impression(s) / ED Diagnoses Final diagnoses:  Fall, initial encounter  Contusion  of right tibia    Rx / DC Orders ED Discharge Orders     None         Blane Ohara, MD 12/08/22 1410

## 2022-12-08 NOTE — ED Notes (Signed)
Patient awake alert, color pink,chest clear,good aeration,no retractions 3plus pulses<2sec refill,patient ambulatory to wr after AVS reviewed

## 2023-06-05 ENCOUNTER — Emergency Department (HOSPITAL_COMMUNITY)
Admission: EM | Admit: 2023-06-05 | Discharge: 2023-06-05 | Disposition: A | Discharge status: Home / Self Care | Payer: Medicaid Other | Attending: Emergency Medicine | Admitting: Emergency Medicine

## 2023-06-05 ENCOUNTER — Encounter (HOSPITAL_COMMUNITY): Payer: Self-pay

## 2023-06-05 ENCOUNTER — Other Ambulatory Visit: Payer: Self-pay

## 2023-06-05 DIAGNOSIS — X58XXXA Exposure to other specified factors, initial encounter: Secondary | ICD-10-CM | POA: Insufficient documentation

## 2023-06-05 DIAGNOSIS — S76011A Strain of muscle, fascia and tendon of right hip, initial encounter: Secondary | ICD-10-CM

## 2023-06-05 DIAGNOSIS — M26629 Arthralgia of temporomandibular joint, unspecified side: Secondary | ICD-10-CM

## 2023-06-05 DIAGNOSIS — S76211A Strain of adductor muscle, fascia and tendon of right thigh, initial encounter: Secondary | ICD-10-CM | POA: Insufficient documentation

## 2023-06-05 DIAGNOSIS — J45909 Unspecified asthma, uncomplicated: Secondary | ICD-10-CM | POA: Diagnosis not present

## 2023-06-05 DIAGNOSIS — R1030 Lower abdominal pain, unspecified: Secondary | ICD-10-CM

## 2023-06-05 DIAGNOSIS — R6884 Jaw pain: Secondary | ICD-10-CM | POA: Diagnosis present

## 2023-06-05 LAB — COMPREHENSIVE METABOLIC PANEL
ALT: 18 U/L (ref 0–44)
AST: 18 U/L (ref 15–41)
Albumin: 3.3 g/dL — ABNORMAL LOW (ref 3.5–5.0)
Alkaline Phosphatase: 50 U/L (ref 38–126)
Anion gap: 4 — ABNORMAL LOW (ref 5–15)
BUN: 8 mg/dL (ref 6–20)
CO2: 26 mmol/L (ref 22–32)
Calcium: 8.8 mg/dL — ABNORMAL LOW (ref 8.9–10.3)
Chloride: 107 mmol/L (ref 98–111)
Creatinine, Ser: 0.81 mg/dL (ref 0.44–1.00)
GFR, Estimated: >60 mL/min (ref >60)
Glucose, Bld: 112 mg/dL — ABNORMAL HIGH (ref 70–99)
Potassium: 3.5 mmol/L (ref 3.5–5.1)
Sodium: 137 mmol/L (ref 135–145)
Total Bilirubin: 0.6 mg/dL (ref <1.2)
Total Protein: 6.6 g/dL (ref 6.5–8.1)

## 2023-06-05 LAB — CBC
HCT: 39 % (ref 36.0–46.0)
Hemoglobin: 12.4 g/dL (ref 12.0–15.0)
MCH: 24.4 pg — ABNORMAL LOW (ref 26.0–34.0)
MCHC: 31.8 g/dL (ref 30.0–36.0)
MCV: 76.6 fL — ABNORMAL LOW (ref 80.0–100.0)
Platelets: 285 10*3/uL (ref 150–400)
RBC: 5.09 MIL/uL (ref 3.87–5.11)
RDW: 15.2 % (ref 11.5–15.5)
WBC: 6.7 10*3/uL (ref 4.0–10.5)
nRBC: 0 % (ref 0.0–0.2)

## 2023-06-05 LAB — URINALYSIS, ROUTINE W REFLEX MICROSCOPIC
Bilirubin Urine: NEGATIVE
Glucose, UA: NEGATIVE mg/dL
Hgb urine dipstick: NEGATIVE
Ketones, ur: NEGATIVE mg/dL
Leukocytes,Ua: NEGATIVE
Nitrite: NEGATIVE
Protein, ur: NEGATIVE mg/dL
Specific Gravity, Urine: 1.023 (ref 1.005–1.030)
pH: 6 (ref 5.0–8.0)

## 2023-06-05 LAB — LIPASE, BLOOD: Lipase: 31 U/L (ref 11–51)

## 2023-06-05 LAB — HCG, SERUM, QUALITATIVE: Preg, Serum: NEGATIVE

## 2023-06-05 MED ORDER — KETOROLAC TROMETHAMINE 15 MG/ML IJ SOLN
15.0000 mg | Freq: Once | INTRAMUSCULAR | Status: AC
  Administered 2023-06-05: 15 mg via INTRAMUSCULAR
  Filled 2023-06-05: qty 1

## 2023-06-05 MED ORDER — ONDANSETRON 4 MG PO TBDP
4.0000 mg | ORAL_TABLET | Freq: Once | ORAL | Status: AC
  Administered 2023-06-05: 4 mg via ORAL
  Filled 2023-06-05: qty 1

## 2023-06-05 MED ORDER — METHOCARBAMOL 500 MG PO TABS
1000.0000 mg | ORAL_TABLET | Freq: Once | ORAL | Status: AC
  Administered 2023-06-05: 1000 mg via ORAL
  Filled 2023-06-05: qty 2

## 2023-06-05 NOTE — ED Triage Notes (Addendum)
Pt c/o right jaw pain that radiates into right ear and down right neck x 1 month. Pt denies swelling, denies fevers. Pt c/o right leg pain as well, states she has pain when she walks. Pt c/o abdominal pain and nausea.

## 2023-06-05 NOTE — Discharge Instructions (Signed)
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  Please take medication regularly.  You can also soft food diet, massage and heat.  I have sent Zofran as needed for nausea and abdominal pain.  Strain of your right adductor muscle can be dealt with alternating Tylenol and anti-inflammatory medication.  You can apply ice and heat over the area.  The medication I sent for jaw pain should help with your right hip pain as well.  Please follow-up with primary care or dentist to ensure resolution of symptoms.  Return to emergency room with any new or worsening symptoms.

## 2023-06-05 NOTE — ED Provider Notes (Signed)
[place] EMERGENCY DEPARTMENT AT [facility] Provider Note   CSN: [id number] Arrival date & time: 06/05/23  1104     History  Chief Complaint  Patient presents with   Jaw Pain    Julia Armstrong is a 32 y.o. female  past medical history of gallstones, asthma reports to emergency room with multiple complaints.  Patient reports she has had approximately 2 weeks of jaw pain bilaterally.  Patient reports that the pain in her jaw however for self to her ears and is worse with talking and chewing foods.  Patient has not tried anything for this medication.  Patient denies difficulty tolerating p.o. intake, swelling in the back of throat, shortness of breath or sore throat.  Patient denies additional injury that caused this event. Patient reports 1 day of inner thigh pain.  Patient reports that she bumped her knee several days ago and her knee has gotten better but now her inner thigh is sore.  Pain is worse with the touch but worse with movement.  Patient has not tried anything for this.  No associated swelling or erythema over the area.  Patient has pain while walking but is weightbearing well with steady gait. Patient also complains years of intermittent cramping abdominal pain.  Patient reports current pain is associated with nausea.  Denies any fevers, chills, vomiting, diarrhea or constipation.  Has not tried anything for this.  HPI     Home Medications Prior to Admission medications   Medication Sig Start Date End Date Taking? Authorizing Provider  acetaminophen (TYLENOL) 500 MG tablet Take 2 tablets (1,000 mg total) by mouth every 8 (eight) hours as needed (pain). 12/14/21   Worthy Rancher, MD  albuterol (VENTOLIN HFA) 108 (90 Base) MCG/ACT inhaler Inhale 2 puffs into the lungs every 6 (six) hours as needed for wheezing or shortness of breath. Patient not taking: Reported on 12/25/2021 12/15/19   Merrilee Jansky, MD  cyclobenzaprine (FLEXERIL) 5 MG tablet Take 1 tablet  (5 mg total) by mouth 2 (two) times daily as needed for muscle spasms. 05/19/22   Horton, Mayer Masker, MD  enalapril (VASOTEC) 5 MG tablet Take 1 tablet (5 mg total) by mouth daily. 12/19/21 01/30/22  [place] Bing, MD  famotidine (PEPCID) 20 MG tablet Take 1 tablet (20 mg total) by mouth 2 (two) times daily. 04/05/22   Garlon Hatchet, PA-C  ibuprofen (ADVIL) 600 MG tablet Take 1 tablet (600 mg total) by mouth every 6 (six) hours as needed (pain). 12/14/21   Worthy Rancher, MD  ibuprofen (ADVIL) 600 MG tablet Take 1 tablet (600 mg total) by mouth every 6 (six) hours as needed. 05/19/22   Horton, Mayer Masker, MD  ondansetron (ZOFRAN-ODT) 4 MG disintegrating tablet Take 1 tablet (4 mg total) by mouth every 8 (eight) hours as needed for nausea. 04/05/22   Garlon Hatchet, PA-C  oxyCODONE (OXY IR/ROXICODONE) 5 MG immediate release tablet Take 1 tablet (5 mg total) by mouth every 6 (six) hours as needed for severe pain. Patient not taking: Reported on 02/28/2022 12/25/21   Adam Phenix, MD  Prenatal Vit-Fe Fumarate-FA (PRENATAL VITAMIN) 27-0.8 MG TABS Take 1 tablet by mouth daily. Patient not taking: Reported on 02/28/2022 05/01/21   Donette Larry, CNM      Allergies    Patient has no known allergies.    Review of Systems   Review of Systems  Gastrointestinal:  Positive for abdominal pain.    Physical Exam Updated Vital  Signs BP 131/78 (BP Location: Right Arm)   Pulse 71   Temp 98.4 F (36.9 C) (Oral)   Resp 18   Ht 5\' 7"  (1.702 m)   Wt 111.1 kg   LMP 05/16/2023 (Exact Date)   SpO2 98%   BMI 38.37 kg/m  Physical Exam Vitals and nursing note reviewed.  Constitutional:      General: She is not in acute distress.    Appearance: She is not toxic-appearing.  HENT:     Head: Normocephalic and atraumatic.     Right Ear: Tympanic membrane, ear canal and external ear normal.     Left Ear: Tympanic membrane, ear canal and external ear normal.     Nose: Nose normal.     Mouth/Throat:      Mouth: Mucous membranes are dry.     Pharynx: Oropharynx is clear.  Eyes:     General: No scleral icterus.    Conjunctiva/sclera: Conjunctivae normal.  Neck:     Vascular: No carotid bruit.     Comments: Tenderness to palpation over temporomandibular joint bilaterally worse on the left side without obvious crepitus or instability. No tooth pain, posterior throat is not erythematous or swollen. Cardiovascular:     Rate and Rhythm: Normal rate and regular rhythm.     Pulses: Normal pulses.     Heart sounds: Normal heart sounds.  Pulmonary:     Effort: Pulmonary effort is normal. No respiratory distress.     Breath sounds: Normal breath sounds.  Abdominal:     General: Abdomen is flat. Bowel sounds are normal. There is no distension.     Palpations: Abdomen is soft. There is no mass.     Tenderness: There is no abdominal tenderness. There is no right CVA tenderness or left CVA tenderness.  Musculoskeletal:     Cervical back: No rigidity or tenderness.     Right lower leg: No edema.     Left lower leg: No edema.     Comments: No tenderness to palpation over patellar tendon, patient does not describe joint laxity of knee.  Mild tenderness palpation over right adductor muscles. No area of swelling or deformity, compartment soft.   Lymphadenopathy:     Cervical: No cervical adenopathy.  Skin:    General: Skin is warm and dry.     Findings: No lesion.  Neurological:     General: No focal deficit present.     Mental Status: She is alert and oriented to person, place, and time. Mental status is at baseline.     ED Results / Procedures / Treatments   Labs (all labs ordered are listed, but only abnormal results are displayed) Labs Reviewed  COMPREHENSIVE METABOLIC PANEL - Abnormal; Notable for the following components:      Result Value   Glucose, Bld 112 (*)    Calcium 8.8 (*)    Albumin 3.3 (*)    Anion gap 4 (*)    All other components within normal limits  CBC - Abnormal;  Notable for the following components:   MCV 76.6 (*)    MCH 24.4 (*)    All other components within normal limits  LIPASE, BLOOD  URINALYSIS, ROUTINE W REFLEX MICROSCOPIC  HCG, SERUM, QUALITATIVE    EKG EKG Interpretation Date/Time:  Friday June 05 2023 11:44:25 EST Ventricular Rate:  66 PR Interval:  198 QRS Duration:  90 QT Interval:  372 QTC Calculation: 389 R Axis:   51  Text Interpretation: Normal sinus rhythm  Normal ECG When compared with ECG of 26-Apr-2022 00:28, PREVIOUS ECG IS PRESENT Confirmed by Eber Hong ([id number]) on 06/05/2023 1:45:18 PM  Radiology No results found.  Procedures Procedures    Medications Ordered in ED Medications  ketorolac (TORADOL) 15 MG/ML injection 15 mg (has no administration in time range)  methocarbamol (ROBAXIN) tablet 1,000 mg (has no administration in time range)  ondansetron (ZOFRAN-ODT) disintegrating tablet 4 mg (has no administration in time range)    ED Course/ Medical Decision Making/ A&P                                 Medical Decision Making Amount and/or Complexity of Data Reviewed Labs: ordered.  Risk Prescription drug management.   This patient presents to the ED for concern of bilateral jaw pain, intermittent abdominal pain and right upper leg pain, this involves an extensive number of treatment options, and is a complaint that carries with it a high risk of complications and morbidity.  The differential diagnosis includes compartment syndrome, contusion, fracture, muscle strain, dislocation, subluxation, TMJ, URI, toothache, trismus, irritable bowel syndrome, abdominal pain, appendicitis, hernia   Co morbidities that complicate the patient evaluation  History of gallstones, asthma, drug use   Additional history obtained:  None    Lab Tests:  I personally interpreted labs.  The pertinent results include:   Urinalysis shows no nitrates, no leukocytes CMP without electrolyte abnormality.  No AST or  ALT ovation.  BUN and creatinine within normal limits. Lipase 31 Pregnancy negative   Imaging Studies ordered:  None  Cardiac Monitoring: / EKG:  The patient was maintained on a cardiac monitor.  I personally viewed and interpreted the cardiac monitored which showed an underlying rhythm of: Sinus rhythm   Consultations Obtained:  None    Problem List / ED Course / Critical interventions / Medication management  Patient reporting to emergency room with multiple complaints.  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  Patient tolerating oral intake without any obvious swelling or fracture.  Patient is overall well-appearing and able to speak in full sentences.  I prescribed Toradol and Robaxin.  Patient needs to follow-up with dentist or primary care.  Resources given. Patient's right adductor has been sore for 1 day.  Tenderness is mild and reproducible on palpation.  No sign of compartment syndrome over this area.  Patient does not report rash, erythema, ecchymosis.  I feel that patient's pain is most likely consistent with sore muscle which will improve with RICE, Toradol, Robaxin as well. Patient's abdominal pain is nonspecific and ongoing.  Patient is tolerating p.o. intake, has had mild nausea inconsistently.  Patient is not tender on exam, soft nondistended abdomen.  Given that this is chronic in nature I will prescribe Zofran encourage patient to try bland diet progress as tolerated. I ordered medication including Robaxin and Toradol for TMJ syndrome, Zofran for nausea  Reevaluation of the patient after these medicines showed that the patient improved I have reviewed the patients home medicines and have made adjustments as needed   Plan  F/u w/ PCP in 2-3d to ensure resolution of sx.  Patient was given return precautions. Patient stable for discharge at this time.  Patient educated on sx/dx and verbalized understanding of plan. Return to ER w/ new or  worsening sx.          Final Clinical Impression(s) / ED Diagnoses Final diagnoses:  TMJ syndrome  Lower abdominal pain  Strain of right hip adductor muscle, initial encounter    Rx / DC Orders ED Discharge Orders     None         Raford Pitcher Evalee Jefferson 06/05/23 1502    Eber Hong, MD 06/05/23 2009

## 2023-09-23 ENCOUNTER — Other Ambulatory Visit: Payer: Self-pay

## 2023-09-23 ENCOUNTER — Encounter: Payer: Self-pay | Admitting: Allergy

## 2023-09-23 ENCOUNTER — Ambulatory Visit (INDEPENDENT_AMBULATORY_CARE_PROVIDER_SITE_OTHER): Payer: Medicaid Other | Admitting: Allergy

## 2023-09-23 VITALS — BP 122/82 | HR 78 | Temp 98.3°F | Resp 19 | Ht 66.75 in | Wt 250.7 lb

## 2023-09-23 DIAGNOSIS — J4520 Mild intermittent asthma, uncomplicated: Secondary | ICD-10-CM

## 2023-09-23 DIAGNOSIS — L508 Other urticaria: Secondary | ICD-10-CM

## 2023-09-23 MED ORDER — MONTELUKAST SODIUM 10 MG PO TABS
10.0000 mg | ORAL_TABLET | Freq: Every day | ORAL | 5 refills | Status: DC
Start: 2023-09-23 — End: 2024-04-20

## 2023-09-23 MED ORDER — ALBUTEROL SULFATE HFA 108 (90 BASE) MCG/ACT IN AERS: INHALATION_SPRAY | RESPIRATORY_TRACT | 1 refills | Status: DC

## 2023-09-23 MED ORDER — FAMOTIDINE 20 MG PO TABS: 20.0000 mg | ORAL_TABLET | Freq: Two times a day (BID) | ORAL | 5 refills | Status: DC

## 2023-09-23 MED ORDER — CETIRIZINE HCL 10 MG PO TABS: 10.0000 mg | ORAL_TABLET | Freq: Two times a day (BID) | ORAL | 5 refills | Status: DC

## 2023-09-23 NOTE — Progress Notes (Signed)
 New Patient Note  RE: Julia Armstrong MRN: [id number] DOB: 06-Sep-1990 Date of Office Visit: 09/23/2023  Primary care provider: Patient, No Pcp Per  Chief Complaint: hives  History of present illness: Julia Armstrong is a 33 y.o. female presenting today for evaluation of hives.  Discussed the use of AI scribe software for clinical note transcription with the patient, who gave verbal consent to proceed.  She has experienced hives for many years, with significant worsening over the past year. The hives are triggered by contact with certain clothing materials, such as leggings and bras, causing intense itching, burning, and swelling. The hives are not limited to specific areas but can appear anywhere on the body. They are essentially occurring daily impacting ability to wear clothes comfortably.    The hives can become severe enough to cause visible swelling and redness, as experienced last night. The severity of the hives can lead to discomfort and even pain in areas such as the hips, which she associates with the presence of hives.  She has not used antihistamines regularly but have tried Benadryl in the past without relief. She has not been on a consistent high-dose antihistamine regimen.  Hives have left bruising at times.  Denies any food triggers. Has not noted worsening hives with illness. No stings/bites.  No changes in body products/detergents.  She has a history of asthma in adulthood, diagnosed after experiencing wheezing with illness or activities/exercise, and does not currently use an albuterol inhaler.  She has a family history of autoimmune diseases, with one sister with Myasthenia gravis and another sister with hives.      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 After c-section. Patient re admitted and incision opened up and wound vac placed.     Past surgical history: Past Surgical History:  Procedure Laterality Date   CESAREAN SECTION     C/S x 3   CESAREAN SECTION N/A 05/10/2018   Procedure: REPEAT CESAREAN SECTION;  Surgeon: Rosser Bing, MD;  Location: [facility];  Service: Obstetrics;  Laterality: N/A;   CESAREAN SECTION N/A 12/11/2021   Procedure: CESAREAN SECTION;  Surgeon: Kathrynn Running, MD;  Location: [facility];  Service: Obstetrics;  Laterality: N/A;   DILATION AND EVACUATION N/A 02/18/2017   Procedure: DILATATION AND EVACUATION;  Surgeon: Tereso Newcomer, MD;  Location: [facility];  Service: Gynecology;  Laterality: N/A;   DILATION AND EVACUATION N/A 05/09/2020   Procedure: DILATATION AND EVACUATION;  Surgeon: Adam Phenix, MD;  Location: [facility];  Service: Gynecology;  Laterality: N/A;   IUD REMOVAL  03/03/2022   WISDOM TOOTH EXTRACTION      Family history:  Family History  Problem Relation Age of Onset   Healthy Mother    Healthy Father    Urticaria Sister    Cancer Maternal Grandmother        breast cancer   Cancer Other    Asthma Son    Asthma Son    Asthma Son    Eczema Daughter     Social history: Lives in a apartment without carpeting with gas heating and central cooling. There is concern for water damage, mildew in the home.  No concern for roaches in the home.   She is a Lawyer.  Denies smoking history.   Medication List: Current Outpatient Medications  Medication Sig Dispense Refill   cetirizine (ZYRTEC) 10 MG tablet Take 1 tablet (10 mg total) by mouth 2 (two) times daily. 60 tablet 5   famotidine (PEPCID) 20 MG tablet Take 1 tablet (20 mg total) by mouth 2 (two) times daily. 60 tablet 5   montelukast (SINGULAIR) 10 MG tablet Take 1 tablet (10 mg total) by mouth at bedtime. 30 tablet 5   acetaminophen (TYLENOL) 500 MG tablet Take 2 tablets (1,000 mg total) by mouth every 8 (eight) hours as needed (pain). (Patient not taking: Reported on 09/23/2023) 60 tablet 0   albuterol (VENTOLIN HFA) 108 (90 Base) MCG/ACT inhaler Two (2) puffs every 4 hours as needed for cough/wheeze/shortness of breath/chest tightness 18 g 1   cyclobenzaprine (FLEXERIL) 5 MG tablet Take 1 tablet (5 mg total) by mouth 2 (two) times daily as needed for muscle spasms. (Patient not taking: Reported on 09/23/2023) 10 tablet 0   enalapril (VASOTEC) 5 MG tablet Take 1 tablet (5 mg total) by mouth daily. 42 tablet 0   famotidine (PEPCID) 20 MG tablet Take 1 tablet (20 mg total) by mouth 2 (two) times daily. (Patient not taking: Reported on 09/23/2023) 30 tablet 0   ibuprofen (ADVIL) 600 MG tablet Take 1 tablet (600 mg total) by mouth every 6 (six) hours as needed (pain). (Patient not taking: Reported on 09/23/2023) 40 tablet 0   ibuprofen (ADVIL) 600 MG tablet Take 1 tablet (600 mg total) by mouth every 6 (six) hours as needed. (Patient not taking: Reported on 09/23/2023) 30 tablet 0   ondansetron (ZOFRAN-ODT) 4 MG disintegrating tablet Take 1 tablet (4 mg total) by mouth every 8 (eight) hours as needed for nausea. (Patient not taking: Reported on 09/23/2023) 10 tablet 0   oxyCODONE (OXY IR/ROXICODONE) 5 MG immediate release tablet Take 1 tablet (5 mg total) by mouth every 6 (six) hours as needed for severe pain. (Patient not taking: Reported on 09/23/2023) 10 tablet 0   Prenatal Vit-Fe Fumarate-FA  (PRENATAL VITAMIN) 27-0.8 MG TABS Take 1 tablet by mouth daily. (Patient not taking: Reported on 02/28/2022) 30 tablet 11   No current facility-administered medications for this visit.    Known medication allergies: No Known Allergies   Physical examination: Blood pressure 122/82, pulse 78, temperature 98.3 F (36.8 C), temperature source Temporal, resp. rate 19, height 5' 6.75" (1.695 m), weight 250 lb 11.2 oz (113.7 kg), SpO2 100%, not currently breastfeeding.  General: Alert, interactive, in no acute distress. HEENT: PERRLA, TMs pearly gray, turbinates non-edematous without discharge, post-pharynx non erythematous. Neck: Supple without lymphadenopathy. Lungs: Clear to auscultation without wheezing, rhonchi or rales. {no increased work of breathing. CV: Normal S1, S2 without murmurs. Abdomen: Nondistended, nontender. Skin: Scattered erythematous urticarial type lesions primarily located left flank , nonvesicular. Extremities:  No clubbing, cyanosis or edema. Neuro:  Grossly intact.  Diagnositics/Labs:  Spirometry: FEV1: 2.87L 95%, FVC: 3.56L 99%, ratio consistent with nonobstructive pattern  Assessment and plan: Chronic urticaria  - at this time etiology of hives and swelling is unknown.  Hives can be caused by a variety of different triggers including illness/infection, foods, medications, stings, exercise, pressure, vibrations, extremes of temperature to name a few however majority of the time there is no identifiable trigger.  Your symptoms have been ongoing for >6 weeks making this chronic thus will obtain labwork to evaluate: CBC w diff, CMP, tryptase, hive panel, environmental panel, alpha-gal panel, inflammatory markers - take high-dose antihistamine regimen: Zyrtec 10mg  1 tab twice a day with Pepcid 20mg  1 tab twice a day and Singulair 10mg  at bedtime - if the above high-dose regimen is not effective enough in controlling hives then would move forward with Xolair monthly  injections for hive control.  Discussed Xolair benefits/risks and protocol today.   Asthma - triggers include illness, activity/exercise - have access to albuterol inhaler 2 puffs every 4-6 hours as needed for cough/wheeze/shortness of breath/chest tightness.  May use 15-20 minutes prior to activity.   Monitor frequency of use.    Follow-up in 2-3 months or sooner if needed  I appreciate the opportunity to take part in Latoyia's care. Please do not hesitate to contact me with questions.  Sincerely,   Margo Aye, MD Allergy/Immunology [facility]

## 2023-09-23 NOTE — Patient Instructions (Addendum)
 Hives  - at this time etiology of hives and swelling is unknown.  Hives can be caused by a variety of different triggers including illness/infection, foods, medications, stings, exercise, pressure, vibrations, extremes of temperature to name a few however majority of the time there is no identifiable trigger.  Your symptoms have been ongoing for >6 weeks making this chronic thus will obtain labwork to evaluate: CBC w diff, CMP, tryptase, hive panel, environmental panel, alpha-gal panel, inflammatory markers - take high-dose antihistamine regimen: Zyrtec 10mg  1 tab twice a day with Pepcid 20mg  1 tab twice a day and Singulair 10mg  at bedtime - if the above high-dose regimen is not effective enough in controlling hives then would move forward with Xolair monthly injections for hive control.  Discussed Xolair benefits/risks and protocol today.   Asthma - triggers include illness, activity/exercise - have access to albuterol inhaler 2 puffs every 4-6 hours as needed for cough/wheeze/shortness of breath/chest tightness.  May use 15-20 minutes prior to activity.   Monitor frequency of use.    Follow-up in 2-3 months or sooner if neededf

## 2023-10-02 LAB — ANA W/REFLEX: Anti Nuclear Antibody (ANA): NEGATIVE

## 2023-10-02 LAB — ALLERGENS W/TOTAL IGE AREA 2
Alternaria Alternata IgE: <0.1 kU/L
Aspergillus Fumigatus IgE: <0.1 kU/L
Bermuda Grass IgE: <0.1 kU/L
Cat Dander IgE: <0.1 kU/L
Cedar, Mountain IgE: <0.1 kU/L
Cladosporium Herbarum IgE: <0.1 kU/L
Cockroach, German IgE: <0.1 kU/L
Common Silver Birch IgE: <0.1 kU/L
Cottonwood IgE: <0.1 kU/L
D Farinae IgE: <0.1 kU/L
D Pteronyssinus IgE: 0.1 kU/L — AB
Dog Dander IgE: <0.1 kU/L
Elm, American IgE: <0.1 kU/L
Johnson Grass IgE: <0.1 kU/L
Maple/Box Elder IgE: <0.1 kU/L
Mouse Urine IgE: <0.1 kU/L
Oak, White IgE: <0.1 kU/L
Pecan, Hickory IgE: <0.1 kU/L
Penicillium Chrysogen IgE: <0.1 kU/L
Pigweed, Rough IgE: <0.1 kU/L
Ragweed, Short IgE: <0.1 kU/L
Sheep Sorrel IgE Qn: <0.1 kU/L
Timothy Grass IgE: <0.1 kU/L
White Mulberry IgE: <0.1 kU/L

## 2023-10-02 LAB — COMPREHENSIVE METABOLIC PANEL
ALT: 15 IU/L (ref 0–32)
AST: 17 IU/L (ref 0–40)
Albumin: 4.1 g/dL (ref 3.9–4.9)
Alkaline Phosphatase: 73 IU/L (ref 44–121)
BUN/Creatinine Ratio: 11 (ref 9–23)
BUN: 8 mg/dL (ref 6–20)
Bilirubin Total: <0.2 mg/dL (ref 0.0–1.2)
CO2: 22 mmol/L (ref 20–29)
Calcium: 9.2 mg/dL (ref 8.7–10.2)
Chloride: 106 mmol/L (ref 96–106)
Creatinine, Ser: 0.7 mg/dL (ref 0.57–1.00)
Globulin, Total: 2.5 g/dL (ref 1.5–4.5)
Glucose: 106 mg/dL — ABNORMAL HIGH (ref 70–99)
Potassium: 4.3 mmol/L (ref 3.5–5.2)
Sodium: 140 mmol/L (ref 134–144)
Total Protein: 6.6 g/dL (ref 6.0–8.5)
eGFR: 119 mL/min/{1.73_m2} (ref >59)

## 2023-10-02 LAB — CBC WITH DIFFERENTIAL/PLATELET
Basophils Absolute: 0 10*3/uL (ref 0.0–0.2)
Basos: 1 %
EOS (ABSOLUTE): 0 10*3/uL (ref 0.0–0.4)
Eos: 1 %
Hematocrit: 38.5 % (ref 34.0–46.6)
Hemoglobin: 12 g/dL (ref 11.1–15.9)
Immature Grans (Abs): 0 10*3/uL (ref 0.0–0.1)
Immature Granulocytes: 0 %
Lymphocytes Absolute: 1.9 10*3/uL (ref 0.7–3.1)
Lymphs: 34 %
MCH: 23.6 pg — ABNORMAL LOW (ref 26.6–33.0)
MCHC: 31.2 g/dL — ABNORMAL LOW (ref 31.5–35.7)
MCV: 76 fL — ABNORMAL LOW (ref 79–97)
Monocytes Absolute: 0.3 10*3/uL (ref 0.1–0.9)
Monocytes: 5 %
Neutrophils Absolute: 3.4 10*3/uL (ref 1.4–7.0)
Neutrophils: 59 %
Platelets: 320 10*3/uL (ref 150–450)
RBC: 5.09 x10E6/uL (ref 3.77–5.28)
RDW: 14.9 % (ref 11.7–15.4)
WBC: 5.7 10*3/uL (ref 3.4–10.8)

## 2023-10-02 LAB — THYROID ANTIBODIES (THYROPEROXIDASE & THYROGLOBULIN)
Thyroglobulin Antibody: <1 [IU]/mL (ref 0.0–0.9)
Thyroperoxidase Ab SerPl-aCnc: 14 [IU]/mL (ref 0–34)

## 2023-10-02 LAB — ALPHA-GAL PANEL
Allergen Lamb IgE: <0.1 kU/L
Beef IgE: <0.1 kU/L
IgE (Immunoglobulin E), Serum: 45 [IU]/mL (ref 6–495)
O215-IgE Alpha-Gal: <0.1 kU/L
Pork IgE: <0.1 kU/L

## 2023-10-02 LAB — SEDIMENTATION RATE: Sed Rate: 26 mm/h (ref 0–32)

## 2023-10-02 LAB — TRYPTASE: Tryptase: 6.6 ug/L (ref 2.2–13.2)

## 2023-10-02 LAB — CHRONIC URTICARIA: cu index: <1.4 (ref <10)

## 2023-10-08 ENCOUNTER — Encounter: Payer: Self-pay | Admitting: Allergy

## 2023-10-09 ENCOUNTER — Telehealth: Payer: Self-pay

## 2023-10-09 NOTE — Telephone Encounter (Signed)
 Patient was called---DOB verified---made aware of her lab results. No further questions or concerns.

## 2024-02-02 ENCOUNTER — Emergency Department (HOSPITAL_BASED_OUTPATIENT_CLINIC_OR_DEPARTMENT_OTHER): Admitting: Radiology

## 2024-02-02 ENCOUNTER — Emergency Department (HOSPITAL_BASED_OUTPATIENT_CLINIC_OR_DEPARTMENT_OTHER)

## 2024-02-02 ENCOUNTER — Emergency Department (HOSPITAL_BASED_OUTPATIENT_CLINIC_OR_DEPARTMENT_OTHER)
Admission: EM | Admit: 2024-02-02 | Discharge: 2024-02-02 | Disposition: A | Discharge status: Home / Self Care | Attending: Emergency Medicine | Admitting: Emergency Medicine

## 2024-02-02 ENCOUNTER — Encounter (HOSPITAL_BASED_OUTPATIENT_CLINIC_OR_DEPARTMENT_OTHER): Payer: Self-pay | Admitting: Emergency Medicine

## 2024-02-02 ENCOUNTER — Other Ambulatory Visit: Payer: Self-pay

## 2024-02-02 DIAGNOSIS — H9201 Otalgia, right ear: Secondary | ICD-10-CM | POA: Diagnosis not present

## 2024-02-02 DIAGNOSIS — J181 Lobar pneumonia, unspecified organism: Secondary | ICD-10-CM | POA: Diagnosis not present

## 2024-02-02 DIAGNOSIS — R059 Cough, unspecified: Secondary | ICD-10-CM | POA: Diagnosis present

## 2024-02-02 DIAGNOSIS — J189 Pneumonia, unspecified organism: Secondary | ICD-10-CM

## 2024-02-02 LAB — CBC
HCT: 38.3 % (ref 36.0–46.0)
Hemoglobin: 11.9 g/dL — ABNORMAL LOW (ref 12.0–15.0)
MCH: 23.2 pg — ABNORMAL LOW (ref 26.0–34.0)
MCHC: 31.1 g/dL (ref 30.0–36.0)
MCV: 74.7 fL — ABNORMAL LOW (ref 80.0–100.0)
Platelets: 340 K/uL (ref 150–400)
RBC: 5.13 MIL/uL — ABNORMAL HIGH (ref 3.87–5.11)
RDW: 16.2 % — ABNORMAL HIGH (ref 11.5–15.5)
WBC: 4.8 K/uL (ref 4.0–10.5)
nRBC: 0 % (ref 0.0–0.2)

## 2024-02-02 LAB — BASIC METABOLIC PANEL WITH GFR
Anion gap: 12 (ref 5–15)
BUN: 7 mg/dL (ref 6–20)
CO2: 22 mmol/L (ref 22–32)
Calcium: 9.6 mg/dL (ref 8.9–10.3)
Chloride: 106 mmol/L (ref 98–111)
Creatinine, Ser: 0.78 mg/dL (ref 0.44–1.00)
GFR, Estimated: >60 mL/min (ref >60)
Glucose, Bld: 102 mg/dL — ABNORMAL HIGH (ref 70–99)
Potassium: 3.7 mmol/L (ref 3.5–5.1)
Sodium: 140 mmol/L (ref 135–145)

## 2024-02-02 LAB — HCG, SERUM, QUALITATIVE: Preg, Serum: NEGATIVE

## 2024-02-02 LAB — TROPONIN T, HIGH SENSITIVITY: Troponin T High Sensitivity: <15 ng/L (ref <19)

## 2024-02-02 MED ORDER — IOHEXOL 300 MG/ML  SOLN
100.0000 mL | Freq: Once | INTRAMUSCULAR | Status: AC | PRN
  Administered 2024-02-02: 75 mL via INTRAVENOUS

## 2024-02-02 MED ORDER — DOXYCYCLINE HYCLATE 100 MG PO CAPS: 100.0000 mg | ORAL_CAPSULE | Freq: Two times a day (BID) | ORAL | 0 refills | Status: AC

## 2024-02-02 NOTE — ED Provider Notes (Signed)
 [place] EMERGENCY DEPARTMENT AT [facility] Provider Note   CSN: [id number] Arrival date & time: 02/02/24  1500     Patient presents with: Cyst and Chest Pain   Julia Armstrong is a 33 y.o. female with no significant past medical history presents with concern for pain behind her right ear that started about 2 days ago.  Reports she feels a bump on the back of her ear.  Reports this is giving her headaches.  Reports she had one of these bumps previously which did resolve on its own.  She denies any fever, chills, neck pain or stiffness.  She denies any inner ear pain, ear drainage, or head trauma.  She also mentioned to triage RN that sometimes she gets a cramping sensation in her chest, but no chest pain or shortness of breath.  She also has reported coughing up lots of mucus over the past couple of days.  Denies any fever or chills associated with this.  States this is not what she came to the ER for, she wants to be evaluated just for the ear pain    Chest Pain      Prior to Admission medications   Medication Sig Start Date End Date Taking? Authorizing Provider  doxycycline  (VIBRAMYCIN ) 100 MG capsule Take 1 capsule (100 mg total) by mouth 2 (two) times daily for 5 days. 02/02/24 02/07/24 Yes Veta Palma, PA-C  acetaminophen  (TYLENOL ) 500 MG tablet Take 2 tablets (1,000 mg total) by mouth every 8 (eight) hours as needed (pain). Patient not taking: Reported on 09/23/2023 12/14/21   Clem Tawni HERO, MD  albuterol  (VENTOLIN  HFA) 108 [phone] Base) MCG/ACT inhaler Two (2) puffs every 4 hours as needed for cough/wheeze/shortness of breath/chest tightness 09/23/23   Jeneal Danita Macintosh, MD  cetirizine  (ZYRTEC ) 10 MG tablet Take 1 tablet (10 mg total) by mouth 2 (two) times daily. 09/23/23   Jeneal Danita Macintosh, MD  cyclobenzaprine  (FLEXERIL ) 5 MG tablet Take 1 tablet (5 mg total) by mouth 2 (two) times daily as needed for muscle spasms. Patient not taking: Reported  on 09/23/2023 05/19/22   Horton, Charmaine FALCON, MD  enalapril  (VASOTEC ) 5 MG tablet Take 1 tablet (5 mg total) by mouth daily. 12/19/21 01/30/22  Izell Harari, MD  famotidine  (PEPCID ) 20 MG tablet Take 1 tablet (20 mg total) by mouth 2 (two) times daily. Patient not taking: Reported on 09/23/2023 04/05/22   Jarold Olam HERO, PA-C  famotidine  (PEPCID ) 20 MG tablet Take 1 tablet (20 mg total) by mouth 2 (two) times daily. 09/23/23   Jeneal Danita Macintosh, MD  ibuprofen  (ADVIL ) 600 MG tablet Take 1 tablet (600 mg total) by mouth every 6 (six) hours as needed (pain). Patient not taking: Reported on 09/23/2023 12/14/21   Clem Tawni HERO, MD  ibuprofen  (ADVIL ) 600 MG tablet Take 1 tablet (600 mg total) by mouth every 6 (six) hours as needed. Patient not taking: Reported on 09/23/2023 05/19/22   Horton, Charmaine FALCON, MD  montelukast  (SINGULAIR ) 10 MG tablet Take 1 tablet (10 mg total) by mouth at bedtime. 09/23/23   Jeneal Danita Macintosh, MD  ondansetron  (ZOFRAN -ODT) 4 MG disintegrating tablet Take 1 tablet (4 mg total) by mouth every 8 (eight) hours as needed for nausea. Patient not taking: Reported on 09/23/2023 04/05/22   Jarold Olam HERO, PA-C  oxyCODONE  (OXY IR/ROXICODONE ) 5 MG immediate release tablet Take 1 tablet (5 mg total) by mouth every 6 (six) hours as needed for severe pain. Patient not taking: Reported  on 09/23/2023 12/25/21   Eveline Lynwood MATSU, MD  Prenatal Vit-Fe Fumarate-FA (PRENATAL VITAMIN) 27-0.8 MG TABS Take 1 tablet by mouth daily. Patient not taking: Reported on 02/28/2022 05/01/21   Sung Hollering, CNM    Allergies: Patient has no known allergies.    Review of Systems  Cardiovascular:  Positive for chest pain.    Updated Vital Signs BP 124/72   Pulse 78   Temp 97.8 F (36.6 C)   Resp 18   Ht 5' 6.75 (1.695 m)   Wt 113.7 kg   LMP 01/31/2024 (Approximate)   SpO2 100%   BMI 39.55 kg/m   Physical Exam Vitals and nursing note reviewed.  Constitutional:      General: She is  not in acute distress.    Appearance: She is well-developed.  HENT:     Head: Normocephalic and atraumatic.     Right Ear: Tympanic membrane and ear canal normal.     Left Ear: Tympanic membrane and ear canal normal.     Ears:     Comments: Patient with mild erythema and edema to the posterior aspect of the right antihelix.  Mild tenderness to the right mastoid without any overlying skin change.  No tenderness palpation of the left mastoid or left outer ear Eyes:     Conjunctiva/sclera: Conjunctivae normal.  Cardiovascular:     Rate and Rhythm: Normal rate and regular rhythm.     Heart sounds: No murmur heard. Pulmonary:     Effort: Pulmonary effort is normal. No respiratory distress.     Breath sounds: Normal breath sounds.  Abdominal:     Palpations: Abdomen is soft.     Tenderness: There is no abdominal tenderness.  Musculoskeletal:        General: No swelling.     Cervical back: Neck supple.  Skin:    General: Skin is warm and dry.     Capillary Refill: Capillary refill takes less than 2 seconds.  Neurological:     Mental Status: She is alert.  Psychiatric:        Mood and Affect: Mood normal.     (all labs ordered are listed, but only abnormal results are displayed) Labs Reviewed  BASIC METABOLIC PANEL WITH GFR - Abnormal; Notable for the following components:      Result Value   Glucose, Bld 102 (*)    All other components within normal limits  CBC - Abnormal; Notable for the following components:   RBC 5.13 (*)    Hemoglobin 11.9 (*)    MCV 74.7 (*)    MCH 23.2 (*)    RDW 16.2 (*)    All other components within normal limits  HCG, SERUM, QUALITATIVE  PREGNANCY, URINE  TROPONIN T, HIGH SENSITIVITY    EKG: EKG Interpretation Date/Time:  Tuesday February 02 2024 15:49:59 EDT Ventricular Rate:  85 PR Interval:  190 QRS Duration:  90 QT Interval:  344 QTC Calculation: 409 R Axis:   63  Text Interpretation: Normal sinus rhythm Normal ECG When compared with  ECG of 05-Jun-2023 11:44, Nonspecific T wave abnormality now evident in Anterior leads Confirmed by Ruthe Cornet [phone]) on 02/02/2024 3:54:36 PM  Radiology: [facility] Chest 2 View Result Date: 02/02/2024 CLINICAL DATA:  Chest discomfort EXAM: CHEST - 2 VIEW COMPARISON:  Chest radiograph dated 04/26/2022 FINDINGS: Normal lung volumes. Patchy left lower lobe opacity. No pleural effusion or pneumothorax. The heart size and mediastinal contours are within normal limits. No acute osseous abnormality. IMPRESSION: Patchy  left lower lobe opacity, which may represent atelectasis or pneumonia in the appropriate clinical setting. Electronically Signed   By: Limin  Xu M.D.   On: 02/02/2024 16:52     Procedures   Medications Ordered in the ED  iohexol  (OMNIPAQUE ) 300 MG/ML solution 100 mL (75 mLs Intravenous Contrast Given 02/02/24 1821)                                    Medical Decision Making Amount and/or Complexity of Data Reviewed Labs: ordered. Radiology: ordered.  Risk Prescription drug management.     Differential diagnosis includes but is not limited to otitis media, otitis externa, cellulitis, cyst, mastoiditis, ACS, arrhythmia, pneumonia, viral URI, COVID, flu, RSV  ED Course:  Upon initial evaluation, patient is very well-appearing, no acute distress.  Stable vitals.  Afebrile, not tachycardic.  She is reporting pain to the posterior aspect of her right ear antihelix.  She does have possible small amount of edema and erythema to the posterior right ear antihelix.  Feel this is probably a cellulitis or cyst although difficulty to truly appreciate what patient is reporting on exam. She has a mild tenderness to palpation of the right mastoid, but there is no overlying skin change.  She does not have any leukocytosis, afebrile, not tachycardic, no severe tenderness to palpation of right mastoid, low clinical concern for a mastoiditis at this time.  Patient is upset at the suggestion of this being  just cellulitis. She requests imaging.  I discussed with patient that I do not feel like this is necessary at this time and this would expose her to unnecessary radiation.  She requests CT head for further evaluation.  Given patient report of a cramps in her chest, cardiac workup was started by triage.  She is not having any active chest pain.  No shortness of breath.  She does report coughing up more mucus than normal over the past couple days.  EKG with normal sinus rhythm and no ST elevations.  Troponin under 15.  Low concern for ACS at this time.  Chest x-ray with opacity in the left lower lobe.  Given patient's increased mucus, will treat for possible pneumonia with doxycycline . Low concern for other emergent etiology at this time.   Labs Ordered: I Ordered, and personally interpreted labs.  The pertinent results include:   CBC without leukocytosis BMP unremarkable Pregnancy negative Troponin under 15  Imaging Studies ordered: I ordered imaging studies including chest x-ray, CT temporal  I independently visualized the imaging with scope of interpretation limited to determining acute life threatening conditions related to emergency care. Imaging showed  Chest x-ray with patchy left lower lobe opacity, which may represent atelectasis or pneumonia in the appropriate clinical setting CT temporal radiology read still pending I agree with the radiologist interpretation   Cardiac Monitoring: / EKG: The patient was maintained on a cardiac monitor.  I personally viewed and interpreted the cardiac monitored which showed an underlying rhythm of: Normal sinus rhythm, no ST changes   Impression: Right ear pain Left lower lobe pneumonia  Disposition:  Care of this patient signed out to oncoming ED provider Margit Paris, PA-C to follow-up on CT scan results. Disposition and treatment plan pending imaging results and clinical judgment of oncoming ED team.     This chart was dictated using  voice recognition software, Dragon. Despite the best efforts of this provider to proofread and  correct errors, errors may still occur which can change documentation meaning.        Final diagnoses:  Pneumonia of left lower lobe due to infectious organism  Right ear pain    ED Discharge Orders          Ordered    doxycycline  (VIBRAMYCIN ) 100 MG capsule  2 times daily        02/02/24 1920               Veta Palma, PA-C 02/02/24 1926    Ruthe Cornet, DO 02/02/24 1943

## 2024-02-02 NOTE — Discharge Instructions (Addendum)
 It appears you may have a pneumonia on your chest x-ray.  You have been prescribed an antibiotic called doxycycline  to treat this infection. Take this antibiotic 2 times a day for the next 5 days. Take the full course of your antibiotic even if you start feeling better. Antibiotics may cause you to have diarrhea.  Your workup today is reassuring. It is very unlikely that your chest pain is due to an issue in your heart.  Your cardiac enzyme (troponin) was normal today. Your EKG which is a measure of the heart's electrical activity and rhythm is normal today. These would both show abnormalities if you were having a heart attack or other heart problem.  You may apply warm compresses to the area to your ear to help promote drainage and resolution of your symptoms.  You may take up to 1000mg  of tylenol  every 6 hours as needed for pain.  Do not take more then 4g per day.  You may use up to 600mg  ibuprofen  every 6 hours as needed for pain.  Do not exceed 2.4g of ibuprofen  per day.  Please return to the emergency room for any worsening chest pain, shortness of breath, fevers, any other new or concerning symptoms

## 2024-02-02 NOTE — ED Notes (Signed)
 Pt d/c instructions, medications, and follow-up care reviewed with pt. Pt verbalized understanding and had no further questions at time of d/c. Pt CA&Ox4, ambulatory, and in NAD at time of d/c

## 2024-02-02 NOTE — ED Provider Notes (Signed)
  Physical Exam  BP 124/72   Pulse 78   Temp 97.8 F (36.6 C)   Resp 18   Ht 5' 6.75 (1.695 m)   Wt 113.7 kg   LMP 01/31/2024 (Approximate)   SpO2 100%   BMI 39.55 kg/m   Physical Exam Vitals and nursing note reviewed.     Procedures  Procedures  ED Course / MDM    Medical Decision Making Amount and/or Complexity of Data Reviewed Labs: ordered. Radiology: ordered.  Risk Prescription drug management.   Postauricular pain, left Causing HA, has had previous cyst Minimally tender behind the ear CT pending to r/o mastoiditis  CT per radiology interpretation:  IMPRESSION: Normal CT of the temporal bones. No CT evidence for acute mastoiditis.  These findings were discussed with the patient. She is stable for discharge as per plan of previous treatment team.        Odell Balls, PA-C 02/02/24 2007    Ruthe Cornet, DO 02/02/24 2107

## 2024-02-02 NOTE — ED Triage Notes (Signed)
 Pt via pov from home with knot behind her right ear x 2 days. Pt states it is very painful; has given her a headache. Pt also states her chest doesn't feel right sometimes. She states that every day she has a cramp in her chest and it feels like her heart stops beating. She states it lasts about 30 minutes sometimes. Pt a&o x 4; nad noted.

## 2024-02-02 NOTE — ED Notes (Signed)
 Asked pt if can urinate. Pt declined

## 2024-02-17 ENCOUNTER — Other Ambulatory Visit: Payer: Self-pay | Admitting: Allergy

## 2024-02-22 ENCOUNTER — Ambulatory Visit (HOSPITAL_COMMUNITY): Admission: EM | Admit: 2024-02-22 | Discharge: 2024-02-22 | Disposition: A | Discharge status: Home / Self Care

## 2024-02-22 ENCOUNTER — Ambulatory Visit (INDEPENDENT_AMBULATORY_CARE_PROVIDER_SITE_OTHER)

## 2024-02-22 ENCOUNTER — Encounter (HOSPITAL_COMMUNITY): Payer: Self-pay

## 2024-02-22 DIAGNOSIS — S90859A Superficial foreign body, unspecified foot, initial encounter: Secondary | ICD-10-CM

## 2024-02-22 DIAGNOSIS — S90852A Superficial foreign body, left foot, initial encounter: Secondary | ICD-10-CM

## 2024-02-22 MED ORDER — BACITRACIN ZINC 500 UNIT/GM EX OINT: TOPICAL_OINTMENT | Freq: Once | CUTANEOUS | Status: AC

## 2024-02-22 MED ORDER — DOXYCYCLINE HYCLATE 100 MG PO CAPS: 100.0000 mg | ORAL_CAPSULE | Freq: Two times a day (BID) | ORAL | 0 refills | Status: AC

## 2024-02-22 NOTE — Discharge Instructions (Addendum)
  1. Foreign body in foot, initial encounter (Primary) - DG Foot Complete Left x-ray performed in UC shows small radiopaque foreign body in the plantar aspect of the left foot. - Foreign Body Removal performed by [facility] provider, 3 mm glass fragment removed from left foot, no secondary complications.  Scant purulent discharge noted after removal - doxycycline  (VIBRAMYCIN ) 100 MG capsule; Take 1 capsule (100 mg total) by mouth 2 (two) times daily for 5 days.  Dispense: 10 capsule; Refill: 0 - Wound care (Clean Wound) performed by UC provider prior to procedure, iodine  used to cleanse the skin prior to incision. - Postprocedure dressing placed over site with bacitracin  ointment and nonadherent gauze, secured with Coban for protection and infection prevention -Continue to monitor symptoms for any change in severity if there is any escalation of current symptoms or development of new symptoms follow-up in ER for further evaluation and management.

## 2024-02-22 NOTE — ED Provider Notes (Signed)
 [facility] [place]  Note:  This document was prepared using Dragon voice recognition software and may include unintentional dictation errors.  MRN: [id number] DOB: 08-18-1990  Subjective:   Julia Armstrong is a 33 y.o. female presenting for ongoing left plantar foot pain after stepping on a piece of glass approximately a month ago.  Patient reports that she removed a large piece of glass from the bottom of her foot a month ago but states that she is still having pain with ambulation to the area and was worried that there may be a fragment of glass still stuck in her foot.  Patient reports that she had a wound to the bottom of her foot that seems to have healed however she is still having pain as if there is something stuck in her foot.  Patient denies any fever, redness, warmth, swelling to the bottom of the foot, but states there is a foreign body sensation when she steps down on her heel.  No current facility-administered medications for this encounter.  Current Outpatient Medications:    doxycycline  (VIBRAMYCIN ) 100 MG capsule, Take 1 capsule (100 mg total) by mouth 2 (two) times daily for 5 days., Disp: 10 capsule, Rfl: 0   acetaminophen  (TYLENOL ) 500 MG tablet, Take 2 tablets (1,000 mg total) by mouth every 8 (eight) hours as needed (pain). (Patient not taking: Reported on 09/23/2023), Disp: 60 tablet, Rfl: 0   albuterol  (VENTOLIN  HFA) 108 (90 Base) MCG/ACT inhaler, TWO (2) PUFFS EVERY 4 HOURS AS NEEDED FOR COUGH/WHEEZE/SHORTNESS OF BREATH/CHEST TIGHTNESS, Disp: 18 each, Rfl: 1   cetirizine  (ZYRTEC ) 10 MG tablet, Take 1 tablet (10 mg total) by mouth 2 (two) times daily., Disp: 60 tablet, Rfl: 5   cyclobenzaprine  (FLEXERIL ) 5 MG tablet, Take 1 tablet (5 mg total) by mouth 2 (two) times daily as needed for muscle spasms. (Patient not taking: Reported on 09/23/2023), Disp: 10 tablet, Rfl: 0   enalapril  (VASOTEC ) 5 MG tablet, Take 1 tablet (5 mg total) by mouth daily., Disp: 42 tablet,  Rfl: 0   famotidine  (PEPCID ) 20 MG tablet, Take 1 tablet (20 mg total) by mouth 2 (two) times daily. (Patient not taking: Reported on 09/23/2023), Disp: 30 tablet, Rfl: 0   famotidine  (PEPCID ) 20 MG tablet, Take 1 tablet (20 mg total) by mouth 2 (two) times daily., Disp: 60 tablet, Rfl: 5   ibuprofen  (ADVIL ) 600 MG tablet, Take 1 tablet (600 mg total) by mouth every 6 (six) hours as needed (pain). (Patient not taking: Reported on 09/23/2023), Disp: 40 tablet, Rfl: 0   ibuprofen  (ADVIL ) 600 MG tablet, Take 1 tablet (600 mg total) by mouth every 6 (six) hours as needed. (Patient not taking: Reported on 09/23/2023), Disp: 30 tablet, Rfl: 0   montelukast  (SINGULAIR ) 10 MG tablet, Take 1 tablet (10 mg total) by mouth at bedtime., Disp: 30 tablet, Rfl: 5   ondansetron  (ZOFRAN -ODT) 4 MG disintegrating tablet, Take 1 tablet (4 mg total) by mouth every 8 (eight) hours as needed for nausea. (Patient not taking: Reported on 09/23/2023), Disp: 10 tablet, Rfl: 0   oxyCODONE  (OXY IR/ROXICODONE ) 5 MG immediate release tablet, Take 1 tablet (5 mg total) by mouth every 6 (six) hours as needed for severe pain. (Patient not taking: Reported on 09/23/2023), Disp: 10 tablet, Rfl: 0   Prenatal Vit-Fe Fumarate-FA (PRENATAL VITAMIN) 27-0.8 MG TABS, Take 1 tablet by mouth daily. 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 After c-section. Patient re admitted and incision opened up and wound vac placed.      Past Surgical History:  Procedure Laterality Date   CESAREAN SECTION     C/S x 3   CESAREAN SECTION N/A 05/10/2018   Procedure: REPEAT CESAREAN SECTION;  Surgeon: Izell Harari, MD;  Location: [facility];  Service: Obstetrics;  Laterality: N/A;   CESAREAN SECTION N/A 12/11/2021   Procedure: CESAREAN SECTION;  Surgeon: Kandis Devaughn Sayres, MD;  Location: [facility];  Service: Obstetrics;  Laterality: N/A;   DILATION AND EVACUATION N/A 02/18/2017   Procedure: DILATATION AND EVACUATION;  Surgeon: Herchel Gloris LABOR, MD;  Location: [facility];  Service: Gynecology;  Laterality: N/A;   DILATION AND EVACUATION N/A 05/09/2020   Procedure: DILATATION AND EVACUATION;  Surgeon: Eveline Lynwood MATSU, MD;  Location: [facility];  Service: Gynecology;  Laterality: N/A;   IUD REMOVAL  03/03/2022   WISDOM TOOTH EXTRACTION      Family History  Problem Relation Age of Onset   Healthy Mother    Healthy Father    Urticaria Sister    Cancer Maternal Grandmother        breast cancer   Cancer Other    Asthma Son    Asthma Son    Asthma Son    Eczema Daughter     Social History   Tobacco Use   Smoking status: Never    Passive exposure: Never  Vaping Use   Vaping status: Never Used  Substance Use Topics   Alcohol use: Not Currently   Drug use: Not Currently    Types: Marijuana    ROS Refer to HPI for ROS details.  Objective:   Vitals: BP 123/84 (BP Location: Right Arm)   Pulse 79   Temp 98.6 F (37 C) (Oral)   Resp 16   LMP 01/31/2024 (Approximate)   SpO2 97%   Physical Exam Vitals and nursing note reviewed.  Constitutional:      General: She is not in acute distress.    Appearance: Normal appearance. She is not ill-appearing.  HENT:     Head: Normocephalic.  Cardiovascular:     Rate and Rhythm: Normal rate.   Pulmonary:     Effort: Pulmonary effort is normal. No respiratory distress.  Musculoskeletal:       Feet:  Feet:     Left foot:     Skin integrity: Callus present. No erythema.     Comments: 3 mm piece of broken glass removed from patient's foot, fragment visualized via x-ray. Skin:    General: Skin is warm and dry.     Capillary Refill: Capillary refill takes less than 2 seconds.     Findings: Wound present. No bruising or erythema.  Neurological:     General: No focal deficit  present.     Mental Status: She is alert and oriented to person, place, and time.  Psychiatric:        Mood and Affect: Mood normal.        Behavior: Behavior normal.     Foreign Body Removal  Date/Time: 02/22/2024 12:10 PM  Performed by: Aurea Ethel NOVAK, NP Authorized by: Aurea Ethel NOVAK, NP   Consent:    Consent obtained:  Verbal   Consent given by:  Patient   Risks discussed:  Bleeding, infection and pain Universal protocol:    Patient identity confirmed:  Verbally with patient and arm band Location:    Location:  Foot   Foot location:  L heel   Depth:  Subcutaneous Anesthesia:    Anesthesia method:  None Procedure type:    Procedure complexity:  Simple Procedure details:    Localization method:  Finder needle   Removal mechanism:  Forceps   Guidance: serial x-rays     Foreign bodies recovered:  1 Post-procedure details:    Neurovascular status: intact     Skin closure:  None   Dressing:  Antibiotic ointment and non-adherent dressing   Procedure completion:  Tolerated well, no immediate complications   No results found for this or any previous visit (from the past 24 hours).  DG Foot Complete Left Result Date: 02/22/2024 CLINICAL DATA:  Pain and discoloration in the foot, the patient stepped on glass about 1 month ago EXAM: LEFT FOOT - COMPLETE 3+ VIEW COMPARISON:  Ankle radiographs 04/02/2016 FINDINGS: No fracture or acute bony findings. No gas identified tracking in the  soft tissues of the foot. No radiographically appreciable foreign body. IMPRESSION: 1. No acute bony findings. No radiographically appreciable foreign body. Electronically Signed   By: Ryan Salvage M.D.   On: 02/22/2024 12:14     Assessment and Plan :     Discharge Instructions       1. Foreign body in foot, initial encounter (Primary) - DG Foot Complete Left x-ray performed in UC shows small radiopaque foreign body in the plantar aspect of the left foot. - Foreign Body Removal performed by [facility] provider, 3 mm glass fragment removed from left foot, no secondary complications.  Scant purulent discharge noted after removal - doxycycline  (VIBRAMYCIN ) 100 MG capsule; Take 1 capsule (100 mg total) by mouth 2 (two) times daily for 5 days.  Dispense: 10 capsule; Refill: 0 - Wound care (Clean Wound) performed by UC provider prior to procedure, iodine  used to cleanse the skin prior to incision. - Postprocedure dressing placed over site with bacitracin  ointment and nonadherent gauze, secured with Coban for protection and infection prevention -Continue to monitor symptoms for any change in severity if there is any escalation of current symptoms or development of new symptoms follow-up in ER for further evaluation and management.      Hermela Hardt B Calandra Madura   Rashada Klontz, [place] B, [place] 02/22/24 1238

## 2024-02-22 NOTE — ED Triage Notes (Signed)
 Pt states stepped on a piece of glass to bottom of lt foot a month ago. States pulled the glass out but thinks a piece is till in there. C/o pain and discoloration to spot.

## 2024-02-29 ENCOUNTER — Encounter (HOSPITAL_COMMUNITY): Payer: Self-pay

## 2024-02-29 ENCOUNTER — Ambulatory Visit (HOSPITAL_COMMUNITY)
Admission: EM | Admit: 2024-02-29 | Discharge: 2024-02-29 | Disposition: A | Discharge status: Home / Self Care | Attending: Emergency Medicine | Admitting: Emergency Medicine

## 2024-02-29 DIAGNOSIS — R300 Dysuria: Secondary | ICD-10-CM | POA: Insufficient documentation

## 2024-02-29 LAB — POCT URINALYSIS DIP (MANUAL ENTRY)
Bilirubin, UA: NEGATIVE
Glucose, UA: NEGATIVE mg/dL
Ketones, POC UA: NEGATIVE mg/dL
Nitrite, UA: NEGATIVE
Protein Ur, POC: 30 mg/dL — AB
Spec Grav, UA: >=1.03 — AB (ref 1.010–1.025)
Urobilinogen, UA: 0.2 U/dL
pH, UA: 6.5 (ref 5.0–8.0)

## 2024-02-29 LAB — POCT URINE PREGNANCY: Preg Test, Ur: NEGATIVE

## 2024-02-29 MED ORDER — CEPHALEXIN 500 MG PO CAPS: 500.0000 mg | ORAL_CAPSULE | Freq: Two times a day (BID) | ORAL | 0 refills | Status: AC

## 2024-02-29 NOTE — Discharge Instructions (Signed)
 I suspect you may have a urinary tract infection. You may also have something like a yeast infection since you are having itching and burning. You can continue to use the nystatin cream to control it or you can try something like hydrocortisone cream.   You will get a call if tests are positive or abnormal, you will not get a call if tests are negative  or normal but you can check results in MyChart if you have a MyChart account.

## 2024-02-29 NOTE — ED Triage Notes (Addendum)
 Patient reports that she has urinary frequency, vaginal itching, and brown vaginal discharge x 2 days.  Patient reports that she put Nystatin cream on her perineum due to irritation.

## 2024-02-29 NOTE — ED Provider Notes (Signed)
 [facility]    CSN: [id number] Arrival date & time: 02/29/24  1346      History   Chief Complaint Chief Complaint  Patient presents with   Vaginal Discharge   Vaginal Itching    HPI Julia Armstrong is a 33 y.o. female. Patient reports that she has urinary frequency, vaginal itching, and brown vaginal discharge x 2 days. Today she started her period.   Denies n/v, abd pain, flank pain.    Patient reports that she put Nystatin cream on her perineum due to irritation and it helped.    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 After c-section. Patient re admitted and incision opened up and wound vac placed.     Patient Active Problem List   Diagnosis Date Noted   Pre-diabetes 10/10/2021   Elevated hemoglobin A1c 08/05/2021   BMI 38.0-38.9,adult 12/01/2017   History of depression 11/03/2017    Past Surgical History:  Procedure Laterality Date   CESAREAN SECTION     C/S x 3   CESAREAN SECTION N/A 05/10/2018   Procedure: REPEAT CESAREAN SECTION;  Surgeon: Izell Harari, MD;  Location: [facility];  Service: Obstetrics;  Laterality: N/A;   CESAREAN SECTION N/A 12/11/2021   Procedure: CESAREAN SECTION;  Surgeon: Kandis Devaughn Sayres, MD;  Location: [facility];  Service: Obstetrics;  Laterality: N/A;    DILATION AND EVACUATION N/A 02/18/2017   Procedure: DILATATION AND EVACUATION;  Surgeon: Herchel Gloris LABOR, MD;  Location: [facility];  Service: Gynecology;  Laterality: N/A;   DILATION AND EVACUATION N/A 05/09/2020   Procedure: DILATATION AND EVACUATION;  Surgeon: Eveline Lynwood MATSU, MD;  Location: [facility];  Service: Gynecology;  Laterality: N/A;   IUD REMOVAL  03/03/2022   WISDOM TOOTH EXTRACTION      OB History     Gravida  8   Para  5   Term  5   Preterm      AB  2   Living  5      SAB  2   IAB      Ectopic      Multiple  0   Live Births  5            Home Medications    Prior to Admission medications   Medication Sig Start Date End Date Taking? Authorizing Provider  cephALEXin  (KEFLEX ) 500 MG capsule Take 1 capsule (500 mg total) by mouth 2 (two) times daily for 7 days. 02/29/24 03/07/24 Yes Richad Jon HERO, NP  Semaglutide-Weight Management 1.7 MG/0.75ML SOAJ Inject 1.7 mg into the skin once a week. 02/22/24  Yes [provider]  WEGOVY 0.25 MG/0.5ML SOAJ Inject 1.7 mg into the skin. 12/28/23  Yes [provider]  acetaminophen  (TYLENOL ) 500 MG tablet Take 2 tablets (1,000 mg total) by mouth every 8 (eight) hours as needed (pain). Patient not taking: Reported on 09/23/2023 12/14/21   Clem Tawni HERO, MD  albuterol  (VENTOLIN  HFA) 108 [phone] Base) MCG/ACT inhaler TWO (2) PUFFS EVERY 4 HOURS AS NEEDED FOR COUGH/WHEEZE/SHORTNESS OF BREATH/CHEST TIGHTNESS 02/18/24   Jeneal Danita Macintosh, MD  cetirizine  (ZYRTEC ) 10 MG tablet Take 1 tablet (10 mg total) by mouth 2 (two) times daily. 09/23/23   Jeneal Danita Macintosh, MD  cyclobenzaprine  (FLEXERIL ) 5 MG tablet Take 1 tablet (5 mg total) by mouth 2 (two) times daily as needed for muscle spasms. Patient not taking: Reported on 09/23/2023 05/19/22   Horton, Charmaine FALCON, MD  enalapril  (VASOTEC ) 5 MG tablet Take 1 tablet (5 mg total) by mouth daily. Patient not taking: Reported on 02/29/2024  12/19/21 01/30/22  Izell Harari, MD  famotidine  (PEPCID ) 20 MG tablet Take 1 tablet (20 mg total) by mouth 2 (two) times daily. Patient not taking: Reported on 09/23/2023 04/05/22   Jarold Olam HERO, PA-C  famotidine  (PEPCID ) 20 MG tablet Take 1 tablet (20 mg total) by mouth 2 (two) times daily. Patient not taking: Reported on 02/29/2024 09/23/23   Jeneal Danita Macintosh, MD  ibuprofen  (ADVIL ) 600 MG tablet Take 1 tablet (600 mg total) by mouth every 6 (six) hours as needed (pain). Patient not taking: Reported on 09/23/2023 12/14/21   Clem Tawni HERO, MD  ibuprofen  (ADVIL ) 600 MG tablet Take 1 tablet (600 mg total) by mouth every 6 (six) hours as needed. Patient not taking: Reported on 09/23/2023 05/19/22   Horton, Charmaine FALCON, MD  montelukast  (SINGULAIR ) 10 MG tablet Take 1 tablet (10 mg total) by mouth at bedtime. 09/23/23   Jeneal Danita Macintosh, MD  ondansetron  (ZOFRAN -ODT) 4 MG disintegrating tablet Take 1 tablet (4 mg total) by mouth every 8 (eight) hours as needed for nausea. Patient not taking: Reported on 09/23/2023 04/05/22   Jarold Olam HERO, PA-C  oxyCODONE  (OXY IR/ROXICODONE ) 5 MG immediate release tablet Take 1 tablet (5 mg total) by mouth every 6 (six) hours as needed for severe pain. Patient not taking: Reported on 02/28/2022 12/25/21   Eveline Lynwood MATSU, MD  Prenatal Vit-Fe Fumarate-FA (PRENATAL VITAMIN) 27-0.8 MG TABS Take 1 tablet by mouth daily. Patient not taking: Reported on 02/28/2022 05/01/21   Sung Hollering, CNM    Family History Family History  Problem Relation Age of Onset   Healthy Mother    Healthy Father    Urticaria Sister    Cancer Maternal Grandmother        breast cancer   Cancer Other    Asthma Son    Asthma Son    Asthma Son    Eczema Daughter     Social History Social History   Tobacco Use   Smoking status: Never    Passive exposure: Never  Vaping Use   Vaping status: Never Used  Substance Use Topics   Alcohol use: Not Currently   Drug use: Not  Currently    Types: Marijuana     Allergies   Patient has no known allergies.   Review of Systems Review of Systems  Genitourinary:  Positive for vaginal discharge.     Physical Exam Triage Vital Signs ED Triage Vitals [02/29/24 1522]  Encounter Vitals Group     BP (!) 134/92     Girls Systolic BP Percentile      Girls Diastolic BP Percentile      Boys  Systolic BP Percentile      Boys Diastolic BP Percentile      Pulse Rate 92     Resp 16     Temp 98.2 F (36.8 C)     Temp Source Oral     SpO2 97 %     Weight      Height      Head Circumference      Peak Flow      Pain Score 8     Pain Loc      Pain Education      Exclude from Growth Chart    No data found.  Updated Vital Signs BP (!) 134/92 (BP Location: Right Arm)   Pulse 92   Temp 98.2 F (36.8 C) (Oral)   Resp 16   LMP 01/31/2024 (Approximate)   SpO2 97%   Visual Acuity Right Eye Distance:   Left Eye Distance:   Bilateral Distance:    Right Eye Near:   Left Eye Near:    Bilateral Near:     Physical Exam Constitutional:      Appearance: Normal appearance.  Cardiovascular:     Rate and Rhythm: Normal rate and regular rhythm.  Pulmonary:     Effort: Pulmonary effort is normal.     Breath sounds: Normal breath sounds.  Abdominal:     General: Abdomen is flat.     Tenderness: There is no abdominal tenderness. There is no right CVA tenderness, left CVA tenderness or guarding.  Neurological:     Mental Status: She is alert.      UC Treatments / Results  Labs (all labs ordered are listed, but only abnormal results are displayed) Labs Reviewed  POCT URINALYSIS DIP (MANUAL ENTRY) - Abnormal; Notable for the following components:      Result Value   Clarity, UA cloudy (*)    Spec Grav, UA >=1.030 (*)    Blood, UA large (*)    Protein Ur, POC =30 (*)    Leukocytes, UA Trace (*)    All other components within normal limits  POCT URINE PREGNANCY  CERVICOVAGINAL ANCILLARY ONLY     EKG   Radiology No results found.  Procedures Procedures (including critical care time)  Medications Ordered in UC Medications - No data to display  Initial Impression / Assessment and Plan / UC Course  I have reviewed the triage vital signs and the nursing notes.  Pertinent labs & imaging results that were available during my care of the patient were reviewed by me and considered in my medical decision making (see chart for details).    UA does not strongly show UTI but she is having urinary frequency that is unusual for her. We discussed that perineal irritation could be from a yeast infection and since the nystatin helped soothe, she can keep using it. It could also be a dermatitis and she could try hydrocortisone. I suspect brown discharge is from spotting just before her period started per usual.   Final Clinical Impressions(s) / UC Diagnoses   Final diagnoses:  Dysuria     Discharge Instructions      I suspect you may have a urinary tract infection. You may also have something like a yeast infection since you are having itching and burning. You can continue to use the nystatin cream to control it or you can try something like hydrocortisone cream.   You will get a call if tests are positive or abnormal, you will not get  a call if tests are negative  or normal but you can check results in MyChart if you have a MyChart account.     ED Prescriptions     Medication Sig Dispense Auth. Provider   cephALEXin  (KEFLEX ) 500 MG capsule Take 1 capsule (500 mg total) by mouth 2 (two) times daily for 7 days. 14 capsule Richad Jon HERO, NP      PDMP not reviewed this encounter.   Richad Jon HERO, NP 02/29/24 1555

## 2024-03-01 ENCOUNTER — Ambulatory Visit (HOSPITAL_COMMUNITY): Payer: Self-pay

## 2024-03-01 LAB — CERVICOVAGINAL ANCILLARY ONLY
Bacterial Vaginitis (gardnerella): NEGATIVE
Candida Glabrata: NEGATIVE
Candida Vaginitis: POSITIVE — AB
Chlamydia: NEGATIVE
Comment: NEGATIVE
Comment: NEGATIVE
Comment: NEGATIVE
Comment: NEGATIVE
Comment: NEGATIVE
Comment: NORMAL
Neisseria Gonorrhea: NEGATIVE
Trichomonas: NEGATIVE

## 2024-03-01 MED ORDER — FLUCONAZOLE 150 MG PO TABS: 150.0000 mg | ORAL_TABLET | Freq: Once | ORAL | 0 refills | Status: AC

## 2024-03-16 ENCOUNTER — Ambulatory Visit (INDEPENDENT_AMBULATORY_CARE_PROVIDER_SITE_OTHER)

## 2024-03-16 ENCOUNTER — Encounter (HOSPITAL_COMMUNITY): Payer: Self-pay

## 2024-03-16 ENCOUNTER — Ambulatory Visit (HOSPITAL_COMMUNITY)
Admission: EM | Admit: 2024-03-16 | Discharge: 2024-03-16 | Disposition: A | Discharge status: Home / Self Care | Attending: Family Medicine | Admitting: Family Medicine

## 2024-03-16 DIAGNOSIS — M79672 Pain in left foot: Secondary | ICD-10-CM | POA: Insufficient documentation

## 2024-03-16 DIAGNOSIS — N898 Other specified noninflammatory disorders of vagina: Secondary | ICD-10-CM | POA: Diagnosis present

## 2024-03-16 LAB — CERVICOVAGINAL ANCILLARY ONLY
Bacterial Vaginitis (gardnerella): NEGATIVE
Candida Glabrata: NEGATIVE
Candida Vaginitis: NEGATIVE
Chlamydia: NEGATIVE
Comment: NEGATIVE
Comment: NEGATIVE
Comment: NEGATIVE
Comment: NEGATIVE
Comment: NEGATIVE
Comment: NORMAL
Neisseria Gonorrhea: NEGATIVE
Trichomonas: NEGATIVE

## 2024-03-16 LAB — POCT URINALYSIS DIP (MANUAL ENTRY)
Blood, UA: NEGATIVE
Glucose, UA: NEGATIVE mg/dL
Leukocytes, UA: NEGATIVE
Nitrite, UA: NEGATIVE
Protein Ur, POC: 30 mg/dL — AB
Spec Grav, UA: 1.025 (ref 1.010–1.025)
Urobilinogen, UA: 1 U/dL
pH, UA: 6 (ref 5.0–8.0)
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
## 2024-03-16 NOTE — ED Triage Notes (Signed)
 Patient presents to the office for vaginal discharge and would like to be check for yeast infection and urinary symptoms.  Patient reports she has glass in her left foot. Patient step on glass in July. Patient states foot is painful.

## 2024-03-22 ENCOUNTER — Ambulatory Visit: Admitting: Podiatry

## 2024-03-24 ENCOUNTER — Ambulatory Visit: Admitting: Podiatry

## 2024-04-01 ENCOUNTER — Ambulatory Visit

## 2024-04-01 ENCOUNTER — Ambulatory Visit (INDEPENDENT_AMBULATORY_CARE_PROVIDER_SITE_OTHER): Admitting: Podiatry

## 2024-04-01 ENCOUNTER — Encounter: Payer: Self-pay | Admitting: Podiatry

## 2024-04-01 DIAGNOSIS — M7672 Peroneal tendinitis, left leg: Secondary | ICD-10-CM | POA: Diagnosis not present

## 2024-04-01 DIAGNOSIS — M76822 Posterior tibial tendinitis, left leg: Secondary | ICD-10-CM

## 2024-04-01 DIAGNOSIS — M7752 Other enthesopathy of left foot: Secondary | ICD-10-CM

## 2024-04-01 MED ORDER — MELOXICAM 15 MG PO TABS: 15.0000 mg | ORAL_TABLET | Freq: Every day | ORAL | 0 refills | Status: DC

## 2024-04-01 NOTE — Patient Instructions (Signed)
 Look for urea 40% cream or ointment and apply to the thickened dry skin / calluses. This can be bought over the counter, at a pharmacy or online such as [employer].
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
## 2024-04-20 ENCOUNTER — Ambulatory Visit (INDEPENDENT_AMBULATORY_CARE_PROVIDER_SITE_OTHER)

## 2024-04-20 ENCOUNTER — Ambulatory Visit (HOSPITAL_COMMUNITY)
Admission: EM | Admit: 2024-04-20 | Discharge: 2024-04-20 | Disposition: A | Discharge status: Home / Self Care | Attending: Family Medicine | Admitting: Family Medicine

## 2024-04-20 ENCOUNTER — Encounter (HOSPITAL_COMMUNITY): Payer: Self-pay

## 2024-04-20 DIAGNOSIS — M545 Low back pain, unspecified: Secondary | ICD-10-CM

## 2024-04-20 DIAGNOSIS — M542 Cervicalgia: Secondary | ICD-10-CM

## 2024-04-20 MED ORDER — METHOCARBAMOL 500 MG PO TABS: 500.0000 mg | ORAL_TABLET | Freq: Two times a day (BID) | ORAL | 0 refills | Status: AC
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
## 2024-04-29 ENCOUNTER — Ambulatory Visit: Admitting: Podiatry

## 2024-04-29 DIAGNOSIS — Z91199 Patient's noncompliance with other medical treatment and regimen due to unspecified reason: Secondary | ICD-10-CM

## 2024-05-02 NOTE — Progress Notes (Signed)
Patient did not show for scheduled appointment today.

## 2024-05-25 ENCOUNTER — Other Ambulatory Visit: Payer: Self-pay | Admitting: Allergy

## 2024-05-25 ENCOUNTER — Other Ambulatory Visit: Payer: Self-pay | Admitting: Podiatry

## 2024-05-25 DIAGNOSIS — M7672 Peroneal tendinitis, left leg: Secondary | ICD-10-CM

## 2024-05-26 ENCOUNTER — Emergency Department (HOSPITAL_COMMUNITY)

## 2024-05-26 ENCOUNTER — Other Ambulatory Visit: Payer: Self-pay

## 2024-05-26 ENCOUNTER — Emergency Department (HOSPITAL_COMMUNITY)
Admission: EM | Admit: 2024-05-26 | Discharge: 2024-05-26 | Discharge status: Against Medical Advice | Attending: Emergency Medicine | Admitting: Emergency Medicine

## 2024-05-26 DIAGNOSIS — W25XXXA Contact with sharp glass, initial encounter: Secondary | ICD-10-CM | POA: Insufficient documentation

## 2024-05-26 DIAGNOSIS — Z5321 Procedure and treatment not carried out due to patient leaving prior to being seen by health care provider: Secondary | ICD-10-CM | POA: Diagnosis not present

## 2024-05-26 DIAGNOSIS — M79672 Pain in left foot: Secondary | ICD-10-CM | POA: Insufficient documentation

## 2024-05-26 NOTE — ED Triage Notes (Signed)
 Pt report stepping her left foot on glass two month ago, notice swelling yesterday, and became painful today. Pain 9 out 10.  Alert and oriented.  Visibly swollen compared to right

## 2024-05-26 NOTE — ED Notes (Signed)
 Not in lobby when called

## 2024-06-18 ENCOUNTER — Other Ambulatory Visit: Payer: Self-pay | Admitting: Allergy
